# Patient Record
Sex: Female | Born: 1960 | Race: Black or African American | Hispanic: No | Marital: Single | Smoking: Current every day smoker
Health system: Southern US, Community
[De-identification: ages and names within clinical notes are randomized; demographics above are authoritative.]

## PROBLEM LIST (undated history)

## (undated) DIAGNOSIS — E079 Disorder of thyroid, unspecified: Secondary | ICD-10-CM

## (undated) DIAGNOSIS — E119 Type 2 diabetes mellitus without complications: Secondary | ICD-10-CM

## (undated) DIAGNOSIS — J45909 Unspecified asthma, uncomplicated: Secondary | ICD-10-CM

## (undated) DIAGNOSIS — G510 Bell's palsy: Secondary | ICD-10-CM

## (undated) DIAGNOSIS — I10 Essential (primary) hypertension: Secondary | ICD-10-CM

## (undated) DIAGNOSIS — J449 Chronic obstructive pulmonary disease, unspecified: Secondary | ICD-10-CM

## (undated) DIAGNOSIS — I517 Cardiomegaly: Secondary | ICD-10-CM

## (undated) DIAGNOSIS — I509 Heart failure, unspecified: Secondary | ICD-10-CM

## (undated) HISTORY — DX: Unspecified asthma, uncomplicated: J45.909

## (undated) HISTORY — PX: CHOLECYSTECTOMY: SHX55

## (undated) HISTORY — PX: ABDOMINAL HYSTERECTOMY: SHX81

## (undated) HISTORY — PX: LAPAROSCOPY: SHX197

## (undated) HISTORY — DX: Type 2 diabetes mellitus without complications: E11.9

## (undated) HISTORY — PX: IMPLANTABLE CARDIOVERTER DEFIBRILLATOR IMPLANT: SHX5860

---

## 2006-03-14 ENCOUNTER — Ambulatory Visit: Payer: Self-pay

## 2006-04-21 ENCOUNTER — Emergency Department: Payer: Self-pay | Admitting: Unknown Physician Specialty

## 2006-04-21 ENCOUNTER — Other Ambulatory Visit: Payer: Self-pay

## 2006-04-26 ENCOUNTER — Other Ambulatory Visit: Payer: Self-pay

## 2006-04-26 ENCOUNTER — Emergency Department: Payer: Self-pay | Admitting: Emergency Medicine

## 2006-04-26 ENCOUNTER — Ambulatory Visit: Payer: Self-pay | Admitting: Emergency Medicine

## 2007-03-19 ENCOUNTER — Ambulatory Visit: Payer: Self-pay

## 2008-03-20 ENCOUNTER — Ambulatory Visit: Payer: Self-pay

## 2009-01-14 ENCOUNTER — Inpatient Hospital Stay: Payer: Self-pay | Admitting: Internal Medicine

## 2011-05-18 ENCOUNTER — Inpatient Hospital Stay: Payer: Self-pay | Admitting: Specialist

## 2012-01-15 ENCOUNTER — Emergency Department: Payer: Self-pay | Admitting: Emergency Medicine

## 2012-01-15 LAB — COMPREHENSIVE METABOLIC PANEL
Albumin: 3.3 g/dL — ABNORMAL LOW (ref 3.4–5.0)
Calcium, Total: 8.2 mg/dL — ABNORMAL LOW (ref 8.5–10.1)
Chloride: 108 mmol/L — ABNORMAL HIGH (ref 98–107)
Co2: 27 mmol/L (ref 21–32)
EGFR (African American): >60
EGFR (Non-African Amer.): 58 — ABNORMAL LOW
Glucose: 106 mg/dL — ABNORMAL HIGH (ref 65–99)
SGOT(AST): 30 U/L (ref 15–37)
SGPT (ALT): 45 U/L

## 2012-01-15 LAB — CBC
HCT: 40.2 % (ref 35.0–47.0)
MCV: 89 fL (ref 80–100)
Platelet: 118 10*3/uL — ABNORMAL LOW (ref 150–440)
RBC: 4.52 10*6/uL (ref 3.80–5.20)
RDW: 16.8 % — ABNORMAL HIGH (ref 11.5–14.5)

## 2012-01-15 LAB — PRO B NATRIURETIC PEPTIDE: B-Type Natriuretic Peptide: 3941 pg/mL — ABNORMAL HIGH (ref 0–125)

## 2012-01-15 LAB — TROPONIN I: Troponin-I: 0.08 ng/mL — ABNORMAL HIGH

## 2013-10-05 ENCOUNTER — Emergency Department: Payer: Self-pay | Admitting: Emergency Medicine

## 2013-10-05 LAB — COMPREHENSIVE METABOLIC PANEL
ALBUMIN: 3.5 g/dL (ref 3.4–5.0)
Alkaline Phosphatase: 54 U/L
Anion Gap: 5 — ABNORMAL LOW (ref 7–16)
BUN: 5 mg/dL — ABNORMAL LOW (ref 7–18)
Bilirubin,Total: 0.3 mg/dL (ref 0.2–1.0)
CALCIUM: 8.6 mg/dL (ref 8.5–10.1)
CHLORIDE: 109 mmol/L — AB (ref 98–107)
Co2: 21 mmol/L (ref 21–32)
Creatinine: 0.65 mg/dL (ref 0.60–1.30)
EGFR (African American): >60
EGFR (Non-African Amer.): >60
GLUCOSE: 96 mg/dL (ref 65–99)
Osmolality: 267 (ref 275–301)
Potassium: 5 mmol/L (ref 3.5–5.1)
SGOT(AST): 30 U/L (ref 15–37)
SGPT (ALT): 14 U/L (ref 12–78)
SODIUM: 135 mmol/L — AB (ref 136–145)
Total Protein: 7.5 g/dL (ref 6.4–8.2)

## 2013-10-05 LAB — CK: CK, Total: 107 U/L (ref 21–215)

## 2013-10-05 LAB — CBC
HCT: 41.3 % (ref 35.0–47.0)
HGB: 13.5 g/dL (ref 12.0–16.0)
MCH: 28.1 pg (ref 26.0–34.0)
MCHC: 32.8 g/dL (ref 32.0–36.0)
MCV: 86 fL (ref 80–100)
PLATELETS: 123 10*3/uL — AB (ref 150–440)
RBC: 4.82 10*6/uL (ref 3.80–5.20)
RDW: 15.7 % — ABNORMAL HIGH (ref 11.5–14.5)
WBC: 5.7 10*3/uL (ref 3.6–11.0)

## 2014-02-18 ENCOUNTER — Emergency Department: Payer: Self-pay | Admitting: Emergency Medicine

## 2014-03-05 DIAGNOSIS — I10 Essential (primary) hypertension: Secondary | ICD-10-CM | POA: Insufficient documentation

## 2014-03-05 DIAGNOSIS — R519 Headache, unspecified: Secondary | ICD-10-CM | POA: Insufficient documentation

## 2014-03-05 DIAGNOSIS — D649 Anemia, unspecified: Secondary | ICD-10-CM | POA: Insufficient documentation

## 2014-03-05 DIAGNOSIS — R51 Headache: Secondary | ICD-10-CM | POA: Insufficient documentation

## 2014-05-06 DIAGNOSIS — E0500 Thyrotoxicosis with diffuse goiter without thyrotoxic crisis or storm: Secondary | ICD-10-CM | POA: Insufficient documentation

## 2014-05-06 DIAGNOSIS — O903 Peripartum cardiomyopathy: Secondary | ICD-10-CM | POA: Insufficient documentation

## 2014-05-06 DIAGNOSIS — I471 Supraventricular tachycardia: Secondary | ICD-10-CM | POA: Insufficient documentation

## 2014-05-06 DIAGNOSIS — D509 Iron deficiency anemia, unspecified: Secondary | ICD-10-CM | POA: Insufficient documentation

## 2014-05-08 DIAGNOSIS — R600 Localized edema: Secondary | ICD-10-CM | POA: Insufficient documentation

## 2014-05-08 DIAGNOSIS — R0602 Shortness of breath: Secondary | ICD-10-CM | POA: Insufficient documentation

## 2014-06-03 ENCOUNTER — Ambulatory Visit: Payer: Self-pay | Admitting: Cardiology

## 2014-06-11 DIAGNOSIS — Z9889 Other specified postprocedural states: Secondary | ICD-10-CM | POA: Insufficient documentation

## 2014-09-01 ENCOUNTER — Ambulatory Visit: Payer: Self-pay | Admitting: Cardiology

## 2014-09-01 LAB — URINALYSIS, COMPLETE
Bacteria: NONE SEEN
Bilirubin,UR: NEGATIVE
Blood: NEGATIVE
Glucose,UR: NEGATIVE mg/dL (ref 0–75)
Ketone: NEGATIVE
Leukocyte Esterase: NEGATIVE
Nitrite: NEGATIVE
Ph: 5 (ref 4.5–8.0)
Protein: NEGATIVE
SPECIFIC GRAVITY: 1.027 (ref 1.003–1.030)

## 2014-09-01 LAB — CBC WITH DIFFERENTIAL/PLATELET
Basophil #: 0 10*3/uL (ref 0.0–0.1)
Basophil %: 0.9 %
EOS PCT: 3.8 %
Eosinophil #: 0.2 10*3/uL (ref 0.0–0.7)
HCT: 39.7 % (ref 35.0–47.0)
HGB: 12.7 g/dL (ref 12.0–16.0)
Lymphocyte #: 1 10*3/uL (ref 1.0–3.6)
Lymphocyte %: 19.6 %
MCH: 28.2 pg (ref 26.0–34.0)
MCHC: 32 g/dL (ref 32.0–36.0)
MCV: 88 fL (ref 80–100)
Monocyte #: 0.3 x10 3/mm (ref 0.2–0.9)
Monocyte %: 6.7 %
Neutrophil #: 3.4 10*3/uL (ref 1.4–6.5)
Neutrophil %: 69 %
PLATELETS: 127 10*3/uL — AB (ref 150–440)
RBC: 4.52 10*6/uL (ref 3.80–5.20)
RDW: 15.9 % — ABNORMAL HIGH (ref 11.5–14.5)
WBC: 5 10*3/uL (ref 3.6–11.0)

## 2014-09-01 LAB — PROTIME-INR
INR: 1
Prothrombin Time: 13.2 secs (ref 11.5–14.7)

## 2014-09-01 LAB — BASIC METABOLIC PANEL
Anion Gap: 5 — ABNORMAL LOW (ref 7–16)
BUN: 14 mg/dL (ref 7–18)
CALCIUM: 8.2 mg/dL — AB (ref 8.5–10.1)
CREATININE: 0.93 mg/dL (ref 0.60–1.30)
Chloride: 110 mmol/L — ABNORMAL HIGH (ref 98–107)
Co2: 28 mmol/L (ref 21–32)
EGFR (African American): >60
EGFR (Non-African Amer.): >60
Glucose: 95 mg/dL (ref 65–99)
Osmolality: 285 (ref 275–301)
POTASSIUM: 4 mmol/L (ref 3.5–5.1)
Sodium: 143 mmol/L (ref 136–145)

## 2014-09-01 LAB — APTT: ACTIVATED PTT: 26.6 s (ref 23.6–35.9)

## 2014-09-04 ENCOUNTER — Ambulatory Visit: Payer: Self-pay | Admitting: Cardiology

## 2015-01-23 NOTE — Op Note (Signed)
PATIENT NAME:  Heather Choi, Heather Choi MR#:  [id number] DATE OF BIRTH:  Aug 10, 1961  DATE OF PROCEDURE:  09/04/2014   TITLE OF PROCEDURE NOTE: Implantation of a new dual-chamber implantable cardioverter defibrillator.   INDICATION: Chronic systolic heart failure, New York Heart Association class II symptoms, ejection fraction 25%.   DETAILS OF PROCEDURE: Informed consent was obtained and placed in the patient's permanent medical record. The risks and benefits of the procedure were explained to the patient, and she consented to proceed. The patient was brought to the Operating Room in a fasting, non-sedated state. The appropriate resuscitative equipment was attached to the patient. The patient was prepped and draped in the usual sterile manner. The area of the left infraclavicular fossa was accessed after local anesthetic was administered.  A 2 cm incision was made in the left infraclavicular fossa. Using a combination of electrocautery and blunt dissection, an ICD pocket was fashioned over the pectoralis muscle in the usual manner. Access to the central circulation was attained x 2 using fluoroscopic guidance and the modified Seldinger technique. Two guidewires were placed into the area of the inferior vena cava.  Over each guidewire. A sheath was advanced. Over the first 9 French sheath, the dilator and guidewire were subsequently removed and the right ventricular lead was advanced to the area of the right ventricular outflow tract.  The lead was then retracted and subsequently placed in the right ventricular apex.  After the appropriate testing was performed, sensed R waves were 19.6 mV with a slew rate of greater than 4 volts per sec, lead impedance of 671 ohms and a threshold of 0.6 volts at 0.5 ms.  The lead was secured to the prepectoralis fascia using an 0 silk suture and adequate redundancy was confirmed using fluoroscopic guidance.  Attention was then turned to the second guidewire over which a 7 French sheath  was advanced, the dilator and guidewire were subsequently removed. The right atrial lead was then advanced to the area of the right anterior lateral wall.  P waves there were 2.3 mV with a slew rate of 0.7 volts per second, impedance of 703 ohms, threshold 0.8 volts at 0.5 ms.  The lead was then secured to the prepectoralis fascia using an 0 silk suture and adequate redundancy was confirmed.   The pocket was irrigated with copious amounts of gentamicin solution.  Adequate hemostasis was obtained and the ICD generator was attached to the right atrial and right ventricular leads and then placed in the pocket.  The pocket was then closed with running layers of 2-0 Vicryl, 3-0 V- Loc, 4-0 V-Loc for the subcuticular layer.  Steri-Strips were applied over the incision site as well as sterile gauze and OpSite.  At the conclusion of the procedure, there were no complications.   ESTIMATED BLOOD LOSS: Minimal.   SUMMARY OF IMPLANTED HARDWARE:  The patient received a Medtronic dual chamber implantable cardiac defibrillator, Model Evera [id number], serial number [id number].  The right atrial lead was a Medtronic [id number], serial number [id number]. The right ventricular lead was a Medtronic Sprint [place], model [id number], single coil lead, 55 cm, serial number [id number].  The final program parameters: Device was program mode AAIDDD with a lower rate limit of 50, upper tracking rate 130, upper sensory rate 130.  The tachy therapies were as follows. There is a VF zone programmed on at a rate of 200 beats per minute with a detection of 30 out of 40 intervals with redetection at 12 out of 16 intervals.  There was a monitor zone programmed at 158 beats per minute.  This will likely be increased to prevent recording episodes of sinus tachycardia in a patient of her age.      ____________________________ Anna Genre. Maisie Fus, MD klt:DT D: 09/04/2014 10:10:28 ET T: 09/04/2014 10:44:45 ET JOB#: [id number]  cc: Caryn Bee L. Maisie Fus, MD,  <Dictator> Sharion Settler MD ELECTRONICALLY SIGNED 09/10/2014 13:03

## 2015-05-26 ENCOUNTER — Emergency Department
Admission: EM | Admit: 2015-05-26 | Discharge: 2015-05-26 | Disposition: A | Discharge status: Home / Self Care | Payer: Self-pay | Attending: Emergency Medicine | Admitting: Emergency Medicine

## 2015-05-26 ENCOUNTER — Other Ambulatory Visit: Payer: Self-pay

## 2015-05-26 DIAGNOSIS — Z880 Allergy status to penicillin: Secondary | ICD-10-CM | POA: Insufficient documentation

## 2015-05-26 DIAGNOSIS — I471 Supraventricular tachycardia: Secondary | ICD-10-CM | POA: Insufficient documentation

## 2015-05-26 DIAGNOSIS — E039 Hypothyroidism, unspecified: Secondary | ICD-10-CM | POA: Insufficient documentation

## 2015-05-26 DIAGNOSIS — Z79899 Other long term (current) drug therapy: Secondary | ICD-10-CM | POA: Insufficient documentation

## 2015-05-26 DIAGNOSIS — Z7982 Long term (current) use of aspirin: Secondary | ICD-10-CM | POA: Insufficient documentation

## 2015-05-26 DIAGNOSIS — Z720 Tobacco use: Secondary | ICD-10-CM | POA: Insufficient documentation

## 2015-05-26 DIAGNOSIS — I10 Essential (primary) hypertension: Secondary | ICD-10-CM | POA: Insufficient documentation

## 2015-05-26 DIAGNOSIS — Z792 Long term (current) use of antibiotics: Secondary | ICD-10-CM | POA: Insufficient documentation

## 2015-05-26 HISTORY — DX: Bell's palsy: G51.0

## 2015-05-26 HISTORY — DX: Disorder of thyroid, unspecified: E07.9

## 2015-05-26 HISTORY — DX: Cardiomegaly: I51.7

## 2015-05-26 HISTORY — DX: Essential (primary) hypertension: I10

## 2015-05-26 LAB — CBC
HCT: 42.2 % (ref 35.0–47.0)
Hemoglobin: 13.8 g/dL (ref 12.0–16.0)
MCH: 27.8 pg (ref 26.0–34.0)
MCHC: 32.6 g/dL (ref 32.0–36.0)
MCV: 85.3 fL (ref 80.0–100.0)
PLATELETS: 107 10*3/uL — AB (ref 150–440)
RBC: 4.95 MIL/uL (ref 3.80–5.20)
RDW: 15.8 % — AB (ref 11.5–14.5)
WBC: 10.2 10*3/uL (ref 3.6–11.0)

## 2015-05-26 LAB — BASIC METABOLIC PANEL
Anion gap: 6 (ref 5–15)
BUN: 12 mg/dL (ref 6–20)
CHLORIDE: 106 mmol/L (ref 101–111)
CO2: 26 mmol/L (ref 22–32)
CREATININE: 1.35 mg/dL — AB (ref 0.44–1.00)
Calcium: 9 mg/dL (ref 8.9–10.3)
GFR calc Af Amer: 51 mL/min — ABNORMAL LOW (ref >60)
GFR, EST NON AFRICAN AMERICAN: 44 mL/min — AB (ref >60)
Glucose, Bld: 247 mg/dL — ABNORMAL HIGH (ref 65–99)
Potassium: 3.7 mmol/L (ref 3.5–5.1)
SODIUM: 138 mmol/L (ref 135–145)

## 2015-05-26 LAB — TROPONIN I
Troponin I: 0.04 ng/mL — ABNORMAL HIGH (ref <0.031)
Troponin I: 0.05 ng/mL — ABNORMAL HIGH (ref <0.031)

## 2015-05-26 LAB — MAGNESIUM: MAGNESIUM: 1.7 mg/dL (ref 1.7–2.4)

## 2015-05-26 LAB — TSH: TSH: 4.545 u[IU]/mL — ABNORMAL HIGH (ref 0.350–4.500)

## 2015-05-26 MED ORDER — SODIUM CHLORIDE 0.9 % IV BOLUS (SEPSIS)
1000.0000 mL | Freq: Once | INTRAVENOUS | Status: AC
  Administered 2015-05-26: 1000 mL via INTRAVENOUS

## 2015-05-26 MED ORDER — CLINDAMYCIN HCL 300 MG PO CAPS: 300.0000 mg | ORAL_CAPSULE | Freq: Three times a day (TID) | ORAL | Status: DC

## 2015-05-26 MED ORDER — MAGNESIUM SULFATE 2 GM/50ML IV SOLN
2.0000 g | Freq: Once | INTRAVENOUS | Status: AC
  Administered 2015-05-26: 2 g via INTRAVENOUS
  Filled 2015-05-26: qty 50

## 2015-05-26 NOTE — ED Notes (Signed)
Patient woke up with fast heartrate

## 2015-05-26 NOTE — ED Notes (Signed)

## 2015-05-26 NOTE — ED Provider Notes (Signed)
[facility] Emergency Department Provider Note  ____________________________________________  Time seen: Approximately 0034 AM  I have reviewed the triage vital signs and the nursing notes.   HISTORY  Chief Complaint Irregular Heart Beat    HPI Heather Choi is a 54 y.o. female who comes in with her heart racing . The patient reports that the hear racing started around 2300. The patient reports that she was sleeping and she was awoken to speak with her sister on the phone. After getting off of the phone the patient was walking back to bed and felt her hear racing. The patient has felt some tightness in her arm and chest and some SOB which was concerning. This has happened multiple times in the past with the last time being 3 years ago. The patient has an enlarged heart and a defibrillator. The patient reports that her pain is a 7/10 The patient came in to be evaluated. In the past she was given medicine and sent home.   Past Medical History  Diagnosis Date  . Enlarged heart   . Thyroid disease   . Hypertension   . Bell's palsy   . Arthritis     There are no active problems to display for this patient.   Past Surgical History  Procedure Laterality Date  . Implantable cardioverter defibrillator implant    . Abdominal hysterectomy    . Cholecystectomy      Current Outpatient Rx  Name  Route  Sig  Dispense  Refill  . ALPRAZolam (XANAX) 0.5 MG tablet   Oral   Take 0.5 mg by mouth at bedtime as needed for anxiety (only takes as needed.).         Marland Kitchen aspirin EC 81 MG tablet   Oral   Take 1 tablet by mouth daily.         . carvedilol (COREG) 25 MG tablet   Oral   Take 1 tablet by mouth 2 (two) times daily.         . furosemide (LASIX) 40 MG tablet   Oral   Take 0.5 tablets by mouth daily.         . isosorbide mononitrate (IMDUR) 30 MG 24 hr tablet   Oral   Take 1 tablet by mouth daily.         Marland Kitchen levothyroxine (SYNTHROID, LEVOTHROID)  150 MCG tablet   Oral   Take 1 tablet by mouth daily. Take 1 tablet every morning with water on an empty stomach 30-60 minutes before breakfast.         . lisinopril (PRINIVIL,ZESTRIL) 40 MG tablet   Oral   Take 1 tablet by mouth daily.         . potassium chloride SA (K-DUR,KLOR-CON) 20 MEQ tablet   Oral   Take 1 tablet by mouth 2 (two) times daily.         Marland Kitchen spironolactone (ALDACTONE) 25 MG tablet   Oral   Take 1 tablet by mouth daily.         . clindamycin (CLEOCIN) 300 MG capsule   Oral   Take 1 capsule (300 mg total) by mouth 3 (three) times daily.   15 capsule   0     Allergies Amlodipine; Penicillins; and Hydrochlorothiazide  No family history on file.  Social History Social History  Substance Use Topics  . Smoking status: Current Every Day Smoker  . Smokeless tobacco: Never Used  . Alcohol Use: None  Comment: rarely    Review of Systems Constitutional: No fever/chills Eyes: No visual changes. ENT: No sore throat. Cardiovascular:  chest pain, palpitations Respiratory:  shortness of breath. Gastrointestinal: No abdominal pain.  No nausea, no vomiting.  No diarrhea.  No constipation. Genitourinary: Negative for dysuria. Musculoskeletal: Negative for back pain. Skin: Negative for rash. Neurological: dizziness, Negative for headaches, focal weakness or numbness.  10-point ROS otherwise negative.  ____________________________________________   PHYSICAL EXAM:  VITAL SIGNS: ED Triage Vitals  Enc Vitals Group     BP 05/26/15 0028 69/29 mmHg     Pulse Rate 05/26/15 0028 167     Resp 05/26/15 0028 17     Temp 05/26/15 0028 97.8 F (36.6 C)     Temp Source 05/26/15 0028 Oral     SpO2 05/26/15 0028 98 %     Weight 05/26/15 0040 150 lb (68.04 kg)     Height 05/26/15 0040 5\' 2"  (1.575 m)     Head Cir --      Peak Flow --      Pain Score 05/26/15 0038 7     Pain Loc --      Pain Edu? --      Excl. in GC? --     Constitutional: Alert and  oriented. Well appearing and in severe distress. Eyes: Conjunctivae are normal. PERRL. EOMI. Head: Atraumatic. Nose: No congestion/rhinnorhea. Cardiovascular: Tachycardia regular rhythm. Grossly normal heart sounds.  Good peripheral circulation. Respiratory: Normal respiratory effort.  No retractions. Lungs CTAB. Gastrointestinal: Soft and nontender. No distention. No abdominal bruits. No CVA tenderness. Musculoskeletal: No lower extremity tenderness nor edema. . Neurologic:  Normal speech and language.  Skin:  Skin is warm, dry and intact.  Psychiatric: Mood and affect are normal.   ____________________________________________   LABS (all labs ordered are listed, but only abnormal results are displayed)  Labs Reviewed  BASIC METABOLIC PANEL - Abnormal; Notable for the following:    Glucose, Bld 247 (*)    Creatinine, Ser 1.35 (*)    GFR calc non Af Amer 44 (*)    GFR calc Af Amer 51 (*)    All other components within normal limits  CBC - Abnormal; Notable for the following:    RDW 15.8 (*)    Platelets 107 (*)    All other components within normal limits  TROPONIN I - Abnormal; Notable for the following:    Troponin I 0.04 (*)    All other components within normal limits  TSH - Abnormal; Notable for the following:    TSH 4.545 (*)    All other components within normal limits  TROPONIN I - Abnormal; Notable for the following:    Troponin I 0.05 (*)    All other components within normal limits  MAGNESIUM   ____________________________________________  EKG  ED ECG REPORT I, Rebecka Apley, the attending physician, personally viewed and interpreted this ECG.   Date: 05/26/2015  EKG Time: 0032  Rate: 168  Rhythm: Suprerventricular tachycardia  Axis: normal  Intervals:none  ST&T Change: flipped t waves lead I , avl  ED ECG REPORT #2 I, Rebecka Apley, the attending physician, personally viewed and interpreted this ECG.   Date: 05/26/2015  EKG Time: 521   Rate: 74  Rhythm: normal sinus rhythm  Axis: normal  Intervals:none  ST&T Change: flipped t waves I, avl, V4, V5, V6 similar to 10/2013    ____________________________________________  RADIOLOGY  none ____________________________________________   PROCEDURES  Procedure(s)  performed: None  Critical Care performed: Yes, see critical care note(s) CRITICAL CARE Performed by: Lucrezia Europe P   Total critical care time: 30 min  Critical care time was exclusive of separately billable procedures and treating other patients.  Critical care was necessary to treat or prevent imminent or life-threatening deterioration.  Critical care was time spent personally by me on the following activities: development of treatment plan with patient and/or surrogate as well as nursing, discussions with consultants, evaluation of patient's response to treatment, examination of patient, obtaining history from patient or surrogate, ordering and performing treatments and interventions, ordering and review of laboratory studies, ordering and review of radiographic studies, pulse oximetry and re-evaluation of patient's condition.  ____________________________________________   INITIAL IMPRESSION / ASSESSMENT AND PLAN / ED COURSE  Pertinent labs & imaging results that were available during my care of the patient were reviewed by me and considered in my medical decision making (see chart for details).  The patient was brought back immediately from triage. On monitoring it was found that the patient did have SVT. As she was getting her IV started the plan is to give the patient adenosine but her supraventricular tachycardia broke prior to receiving medications. The patient reports that her chest pain did improve after the improvement of the patient's heart rate.She was found to have low magnesium and a high TSH. The patient was given some magnesium. The patient also received some IV fluids and monitoring for  some hours. Although the patient has a mildly elevated troponin there was no rise on repeat which allows me to think that it was due to the demand of her SVT. The patient feels well and will be discharged to home. The patient wanted her spider bites evaluated and felt they were infected. The patient will receive some clindamycin for her bug bites ____________________________________________   FINAL CLINICAL IMPRESSION(S) / ED DIAGNOSES  Final diagnoses:  SVT (supraventricular tachycardia)  Hypomagnesemia  Hypothyroidism, unspecified hypothyroidism type      Rebecka Apley, MD 05/26/15 [phone]

## 2015-05-26 NOTE — Discharge Instructions (Signed)
Supraventricular Tachycardia Supraventricular tachycardia (SVT) is an abnormal heart rhythm (arrhythmia) that causes the heart to beat very fast (tachycardia). This kind of fast heartbeat originates in the upper chambers of the heart (atria). SVT can cause the heart to beat greater than 100 beats per minute. SVT can have a rapid burst of heartbeats. This can start and stop suddenly without warning and is called nonsustained. SVT can also be sustained, in which the heart beats at a continuous fast rate.  CAUSES  There can be different causes of SVT. Some of these include:  Heart valve problems such as mitral valve prolapse.  An enlarged heart (hypertrophic cardiomyopathy).  Congenital heart problems.  Heart inflammation (pericarditis).  Hyperthyroidism.  Low potassium or magnesium levels.  Caffeine.  Drug use such as cocaine, methamphetamines, or stimulants.  Some over-the-counter medicines such as:  Decongestants.  Diet medicines.  Herbal medicines. SYMPTOMS  Symptoms of SVT can vary. Symptoms depend on whether the SVT is sustained or nonsustained. You may experience:  No symptoms (asymptomatic).  An awareness of your heart beating rapidly (palpitations).  Shortness of breath.  Chest pain or pressure. If your blood pressure drops because of the SVT, you may experience:  Fainting or near fainting.  Weakness.  Dizziness. DIAGNOSIS  Different tests can be performed to diagnose SVT, such as:  An electrocardiogram (EKG). This is a painless test that records the electrical activity of your heart.  Holter monitor. This is a 24 hour recording of your heart rhythm. You will be given a diary. Write down all symptoms that you have and what you were doing at the time you experienced symptoms.  Arrhythmia monitor. This is a small device that your wear for several weeks. It records the heart rhythm when you have symptoms.  Echocardiogram. This is an imaging test to help detect  abnormal heart structure such as congenital abnormalities, heart valve problems, or heart enlargement.  Stress test. This test can help determine if the SVT is related to exercise.  Electrophysiology study (EPS). This is a procedure that evaluates your heart's electrical system and can help your caregiver find the cause of your SVT. TREATMENT  Treatment of SVT depends on the symptoms, how often it recurs, and whether there are any underlying heart problems.   If symptoms are rare and no other cardiac disease is present, no treatment may be needed.  Blood work may be done to check potassium, magnesium, and thyroid hormone levels to see if they are abnormal. If these levels are abnormal, treatment to correct the problems will occur. Medicines Your caregiver may use oral medicines to treat SVT. These medicines are given for long-term control of SVT. Medicines may be used alone or in combination with other treatments. These medicines work to slow nerve impulses in the heart muscle. These medicines can also be used to treat high blood pressure. Some of these medicines may include:  Calcium channel blockers.  Beta blockers.  Digoxin. Nonsurgical procedures Nonsurgical techniques may be used if oral medicines do not work. Some examples include:  Cardioversion. This technique uses either drugs or an electrical shock to restore a normal heart rhythm.  Cardioversion drugs may be given through an intravenous (IV) line to help "reset" the heart rhythm.  In electrical cardioversion, the caregiver shocks your heart to stop its beat for a split second. This helps to reset the heart to a normal rhythm.  Ablation. This procedure is done under mild sedation. High frequency radio wave energy is used to  destroy the area of heart tissue responsible for the SVT. HOME CARE INSTRUCTIONS   Do not smoke.  Only take medicines prescribed by your caregiver. Check with your caregiver before using over-the-counter  medicines.  Check with your caregiver about how much alcohol and caffeine (coffee, tea, colas, or chocolate) you may have.  It is very important to keep all follow-up referrals and appointments in order to properly manage this problem. SEEK IMMEDIATE MEDICAL CARE IF:  You have dizziness.  You faint or nearly faint.  You have shortness of breath.  You have chest pain or pressure.  You have sudden nausea or vomiting.  You have profuse sweating.  You are concerned about how long your symptoms last.  You are concerned about the frequency of your SVT episodes. If you have the above symptoms, call your local emergency services (911 in [place] immediately. Do not drive yourself to the hospital. MAKE SURE YOU:   Understand these instructions.  Will watch your condition.  Will get help right away if you are not doing well or get worse. Document Released: 09/18/2005 Document Revised: 12/11/2011 Document Reviewed: 12/31/2008 [facility] Patient Information 2015 [place], [place]. This information is not intended to replace advice given to you by your health care provider. Make sure you discuss any questions you have with your health care provider.  Hypomagnesemia Magnesium is a common ion (mineral) in the body which is needed for metabolism. It is about how the body handles food and other chemical reactions necessary for life. Only about 2% of the magnesium in our body is found in the blood. When this is low, it is called hypomagnesemia. The blood will measure only a tiny amount of the magnesium in our body. When it is low in our blood, it does not mean that the whole body supply is low. The normal serum concentration ranges from 1.8-2.5 mEq/L. When the level gets to be less than 1.0 mEq/L, a number of problems begin to happen.  CAUSES   Receiving intravenous fluids without magnesium replacement.  Loss of magnesium from the bowel by nasogastric suction.  Loss of magnesium from nausea and  vomiting or severe diarrhea. Any of the inflammatory bowel conditions can cause this.  Abuse of alcohol often leads to low serum magnesium.  An inherited form of magnesium loss happens when the kidneys lose magnesium. This is called familial or primary hypomagnesemia.  Some medications such as diuretics also cause the loss of magnesium. SYMPTOMS  These following problems are worse if the changes in magnesium levels come on suddenly.  Tremor.  Confusion.  Muscle weakness.  Oversensitive to sights and sounds.  Sensitive reflexes.  Depression.  Muscular fibrillations.  Overreactivity of the nerves.  Irritability.  Psychosis.  Spasms of the hand muscles.  Tetany (where the muscles go into uncontrollable spasms). DIAGNOSIS  This condition can be diagnosed by blood tests. TREATMENT   In an emergency, magnesium can be given intravenously (by vein).  If the condition is less worrisome, it can be corrected by diet. High levels of magnesium are found in green leafy vegetables, peas, beans, and nuts among other things. It can also be given through medications by mouth.  If it is being caused by medications, changes can be made.  If alcohol is a problem, help is available if there are difficulties giving it up. Document Released: 06/14/2005 Document Revised: 02/02/2014 Document Reviewed: 05/08/2008 [facility] Patient Information 2015 [place], [place]. This information is not intended to replace advice given to you by your  health care provider. Make sure you discuss any questions you have with your health care provider.  Hypothyroidism The thyroid is a large gland located in the lower front of your neck. The thyroid gland helps control metabolism. Metabolism is how your body handles food. It controls metabolism with the hormone thyroxine. When this gland is underactive (hypothyroid), it produces too little hormone.  CAUSES These include:   Absence or destruction of thyroid  tissue.  Goiter due to iodine deficiency.  Goiter due to medications.  Congenital defects (since birth).  Problems with the pituitary. This causes a lack of TSH (thyroid stimulating hormone). This hormone tells the thyroid to turn out more hormone. SYMPTOMS  Lethargy (feeling as though you have no energy)  Cold intolerance  Weight gain (in spite of normal food intake)  Dry skin  Coarse hair  Menstrual irregularity (if severe, may lead to infertility)  Slowing of thought processes Cardiac problems are also caused by insufficient amounts of thyroid hormone. Hypothyroidism in the newborn is cretinism, and is an extreme form. It is important that this form be treated adequately and immediately or it will lead rapidly to retarded physical and mental development. DIAGNOSIS  To prove hypothyroidism, your caregiver may do blood tests and ultrasound tests. Sometimes the signs are hidden. It may be necessary for your caregiver to watch this illness with blood tests either before or after diagnosis and treatment. TREATMENT  Low levels of thyroid hormone are increased by using synthetic thyroid hormone. This is a safe, effective treatment. It usually takes about four weeks to gain the full effects of the medication. After you have the full effect of the medication, it will generally take another four weeks for problems to leave. Your caregiver may start you on low doses. If you have had heart problems the dose may be gradually increased. It is generally not an emergency to get rapidly to normal. HOME CARE INSTRUCTIONS   Take your medications as your caregiver suggests. Let your caregiver know of any medications you are taking or start taking. Your caregiver will help you with dosage schedules.  As your condition improves, your dosage needs may increase. It will be necessary to have continuing blood tests as suggested by your caregiver.  Report all suspected medication side effects to your  caregiver. SEEK MEDICAL CARE IF: Seek medical care if you develop:  Sweating.  Tremulousness (tremors).  Anxiety.  Rapid weight loss.  Heat intolerance.  Emotional swings.  Diarrhea.  Weakness. SEEK IMMEDIATE MEDICAL CARE IF:  You develop chest pain, an irregular heart beat (palpitations), or a rapid heart beat. MAKE SURE YOU:   Understand these instructions.  Will watch your condition.  Will get help right away if you are not doing well or get worse. Document Released: 09/18/2005 Document Revised: 12/11/2011 Document Reviewed: 05/08/2008 [facility] Patient Information 2015 [place], [place]. This information is not intended to replace advice given to you by your health care provider. Make sure you discuss any questions you have with your health care provider.

## 2015-12-30 ENCOUNTER — Emergency Department
Admission: EM | Admit: 2015-12-30 | Discharge: 2015-12-30 | Disposition: A | Discharge status: Home / Self Care | Payer: Self-pay | Attending: Emergency Medicine | Admitting: Emergency Medicine

## 2015-12-30 DIAGNOSIS — I517 Cardiomegaly: Secondary | ICD-10-CM | POA: Insufficient documentation

## 2015-12-30 DIAGNOSIS — G510 Bell's palsy: Secondary | ICD-10-CM | POA: Insufficient documentation

## 2015-12-30 DIAGNOSIS — E079 Disorder of thyroid, unspecified: Secondary | ICD-10-CM | POA: Insufficient documentation

## 2015-12-30 DIAGNOSIS — Z955 Presence of coronary angioplasty implant and graft: Secondary | ICD-10-CM | POA: Insufficient documentation

## 2015-12-30 DIAGNOSIS — H109 Unspecified conjunctivitis: Secondary | ICD-10-CM | POA: Insufficient documentation

## 2015-12-30 DIAGNOSIS — I10 Essential (primary) hypertension: Secondary | ICD-10-CM | POA: Insufficient documentation

## 2015-12-30 DIAGNOSIS — Z79899 Other long term (current) drug therapy: Secondary | ICD-10-CM | POA: Insufficient documentation

## 2015-12-30 DIAGNOSIS — Z792 Long term (current) use of antibiotics: Secondary | ICD-10-CM | POA: Insufficient documentation

## 2015-12-30 DIAGNOSIS — F17210 Nicotine dependence, cigarettes, uncomplicated: Secondary | ICD-10-CM | POA: Insufficient documentation

## 2015-12-30 DIAGNOSIS — M1990 Unspecified osteoarthritis, unspecified site: Secondary | ICD-10-CM | POA: Insufficient documentation

## 2015-12-30 DIAGNOSIS — Z7982 Long term (current) use of aspirin: Secondary | ICD-10-CM | POA: Insufficient documentation

## 2015-12-30 DIAGNOSIS — H16002 Unspecified corneal ulcer, left eye: Secondary | ICD-10-CM | POA: Insufficient documentation

## 2015-12-30 MED ORDER — ERYTHROMYCIN 5 MG/GM OP OINT
1.0000 "application " | TOPICAL_OINTMENT | Freq: Four times a day (QID) | OPHTHALMIC | Status: DC
  Administered 2015-12-30: 1 via OPHTHALMIC
  Filled 2015-12-30 (×2): qty 1

## 2015-12-30 MED ORDER — FLUORESCEIN SODIUM 1 MG OP STRP
ORAL_STRIP | OPHTHALMIC | Status: AC
  Filled 2015-12-30: qty 1

## 2015-12-30 MED ORDER — TETRACAINE HCL 0.5 % OP SOLN
OPHTHALMIC | Status: AC
  Filled 2015-12-30: qty 2

## 2015-12-30 MED ORDER — KETOROLAC TROMETHAMINE 0.5 % OP SOLN: 1.0000 [drp] | Freq: Four times a day (QID) | OPHTHALMIC | Status: DC

## 2015-12-30 MED ORDER — GENTAMICIN SULFATE 0.3 % OP SOLN: 2.0000 [drp] | OPHTHALMIC | Status: DC

## 2015-12-30 NOTE — Discharge Instructions (Signed)
Corneal Ulcer °A corneal ulcer is an open sore on the cornea. The cornea is the clear covering at the front and center of the eye.  °CAUSES  °Most corneal ulcers are caused by infection, but there are other causes as well. °· Bacterial infection. A bacterial infection can occur and cause a corneal ulcer if: °¨ Contact lenses are worn too long (especially overnight) or are not properly cared for. °¨ An eye injury occurs, allowing bacteria to infect the area of injury. °· Viral infection. A viral infection can occur and cause a corneal ulcer if: °¨ The eye becomes infected with a virus, such as the herpes simplex (cold sore) virus, chickenpox virus, or shingles virus. °· Fungal infection. A fungal infection can occur and cause a corneal ulcer if: °¨ An eye injury resulted from contact with a plant or plant material. °¨ An anti-inflammatory eye drop is overused. °¨ You have a weakened immune system. °¨ Contact lenses are improperly cared for or become infected. °· Foreign bodies in the eye, such as sand, glass, or small pieces of glass or metal. °· Dry eyes. °· Certain disorders that prevent eyelids from closing completely, such as Bell's palsy. °· Contact lenses, especially extended-wear soft contact lenses. Contact lenses can: °¨ Scratch the cornea's surface, allowing bacteria to enter the scratch. °¨ Trap dirt underneath the contact lens, which can scratch the cornea. °¨ Harbor bacteria and fungi, making it more likely for bacterial infections to occur. °¨ Block oxygen from the cornea, making it more likely for infections to occur. °SYMPTOMS  °· Eye pain that is often severe. °· Blurry vision. °· Light sensitivity. °· Pus or thick discharge coming from your eye. °· Eye redness. °· Feeling like something is in your eye. °· Watery or itchy eye. °· Burning or stinging feeling. °Some ulcers that are very big may be seen as a white spot on the cornea. °DIAGNOSIS  °An eye exam will be performed. Your health care provider  may use a special kind of microscope (slit lamp) to look at the cornea. Eye drops may be put into the eye to make the ulcer easier to see. If it is suspected that an infection caused the corneal ulcer, tissue samples or cultures from the eye may be taken. Numbing eye drops will be given before any samples or cultures are taken. The samples or cultures will be examined in the lab to check for bacteria, viruses, or fungi. °TREATMENT  °Treatment of the corneal ulcer depends on the cause. If your ulcer is severe, you may be given antibiotic eye drops up until your health care provider knows the test results. Other treatments can include: °· Antibacterial, antiviral, or antifungal eye drops or ointment. °· Removing the foreign body that caused the eye injury. °· Artificial tears or a bandage contact lens if severe dry eyes caused the corneal ulcer. °· Over-the-counter or prescription pain medicine. °· Steroidal eye drops if the eye is inflamed and swollen. °· Antibiotic medicines by mouth. °· An injection of medicine under the thin membrane covering the eyeball (conjunctiva). This allows medicine to reach the ulcer in high doses. °· Eye patching to reduce irritation from blinking and bright light. An eye patch may not be given if the ulcer was caused by a bacterial infection. °If the corneal ulcer causes a scar on the cornea that interferes with vision, hospitalization and surgery may be needed to replace the cornea (corneal transplant). °HOME CARE INSTRUCTIONS  °· If prescribed, use your antibiotic   pills, eye drops, or ointment as directed. Continue using them even if you start to feel better. You may have to apply eye drops as often as every few minutes to every hour, for days. It may be necessary to set your alarm clock every few minutes to every hour during the night. This is absolutely necessary.  Only take over-the-counter or prescription medicines as directed by your health care provider.  Apply artificial  tears as needed if you have dry eyes.  Do not touch or rub your eye, because this may increase the irritation and spread the infection.  Avoid wearing eye makeup.  Stay in a dark room and use sunglasses if you have light sensitivity.  Apply cool packs to your eye to relieve discomfort and swelling.  If your eye is patched, you should not drive or use machinery. You will have reduced side vision and ability to judge distance.  Do not drive or operate machinery until approved by your health care provider. Your ability to judge distances is impaired.  Follow up with your health care provider as directed.  Do not wear contact lenses until your health care provider approves. If you normally wear contact lenses, follow these general rules to avoid the risk of a corneal ulcer:  Do not wear contact lenses while you sleep.  Wash your hands before removing contact lenses.  Properly sterilize and store your contact lenses.  Regularly clean your contact lens case.  Do not use your saliva or tap water to clean or wet your contact lenses.  Remove your contact lenses if your eye becomes irritated. You may put them back in once your eyes feel better. SEEK IMMEDIATE MEDICAL CARE IF:   You notice a change in your vision.  Your pain is getting worse, not better.  You have increasing discharge from the eye. MAKE SURE YOU:   Understand these instructions.  Will watch your condition.  Will get help right away if you are not doing well or get worse.   This information is not intended to replace advice given to you by your health care provider. Make sure you discuss any questions you have with your health care provider.   Document Released: 10/26/2004 Document Revised: 10/09/2014 Document Reviewed: 02/18/2013 Elsevier Interactive Patient Education 2016 [employer]  Use the eye drops as directed. Follow-up with your eye care provider as needed. Avoid extended use of contact lens until  treatment is completed.

## 2015-12-30 NOTE — ED Provider Notes (Signed)
[facility] Emergency Department Provider Note ____________________________________________  Time seen: 1025  I have reviewed the triage vital signs and the nursing notes.  HISTORY  Chief Complaint  Eye Problem   HPI Heather Choi is a 55 y.o. female presents to the ED for evaluation of left eye pain and drainage with onset yesterday. She notes that the eye is irritated and the lid is somewhat swollen.She denies any fevers, chills, sweats; and also denies any nausea, vomiting, or dizziness. She describes pain in the left eye at 10/10 in triage. She reports she has her extended wear contacts on because she doesn't own (spare) eyeglasses.   Past Medical History  Diagnosis Date  . Enlarged heart   . Thyroid disease   . Hypertension   . Bell's palsy   . Arthritis     There are no active problems to display for this patient.  Past Surgical History  Procedure Laterality Date  . Implantable cardioverter defibrillator implant    . Abdominal hysterectomy    . Cholecystectomy      Current Outpatient Rx  Name  Route  Sig  Dispense  Refill  . ALPRAZolam (XANAX) 0.5 MG tablet   Oral   Take 0.5 mg by mouth at bedtime as needed for anxiety (only takes as needed.).         Marland Kitchen aspirin EC 81 MG tablet   Oral   Take 1 tablet by mouth daily.         . carvedilol (COREG) 25 MG tablet   Oral   Take 1 tablet by mouth 2 (two) times daily.         . clindamycin (CLEOCIN) 300 MG capsule   Oral   Take 1 capsule (300 mg total) by mouth 3 (three) times daily.   15 capsule   0   . furosemide (LASIX) 40 MG tablet   Oral   Take 0.5 tablets by mouth daily.         Marland Kitchen gentamicin (GARAMYCIN) 0.3 % ophthalmic solution   Left Eye   Place 2 drops into the left eye every 4 (four) hours.   5 mL   0   . isosorbide mononitrate (IMDUR) 30 MG 24 hr tablet   Oral   Take 1 tablet by mouth daily.         Marland Kitchen ketorolac (ACULAR) 0.5 % ophthalmic solution   Left Eye   Place 1 drop into the left eye 4 (four) times daily.   5 mL   0   . levothyroxine (SYNTHROID, LEVOTHROID) 150 MCG tablet   Oral   Take 1 tablet by mouth daily. Take 1 tablet every morning with water on an empty stomach 30-60 minutes before breakfast.         . lisinopril (PRINIVIL,ZESTRIL) 40 MG tablet   Oral   Take 1 tablet by mouth daily.         . potassium chloride SA (K-DUR,KLOR-CON) 20 MEQ tablet   Oral   Take 1 tablet by mouth 2 (two) times daily.         Marland Kitchen spironolactone (ALDACTONE) 25 MG tablet   Oral   Take 1 tablet by mouth daily.          Allergies Amlodipine; Penicillins; and Hydrochlorothiazide  No family history on file.  Social History Social History  Substance Use Topics  . Smoking status: Current Every Day Smoker    Types: Cigarettes  . Smokeless tobacco: Never Used  .  Alcohol Use: No     Comment: rarely   Review of Systems  Constitutional: Negative for fever. Eyes: Negative for visual changes. Reports eye irritation and redness as above. ENT: Negative for sore throat. Musculoskeletal: Negative for back pain. Skin: Negative for rash. Neurological: Negative for headaches, focal weakness or numbness. ____________________________________________  PHYSICAL EXAM:  VITAL SIGNS: ED Triage Vitals  Enc Vitals Group     BP 12/30/15 0943 144/76 mmHg     Pulse Rate 12/30/15 0943 78     Resp 12/30/15 0943 18     Temp 12/30/15 0943 98.4 F (36.9 C)     Temp Source 12/30/15 0943 Oral     SpO2 12/30/15 0943 98 %     Weight 12/30/15 0943 148 lb (67.132 kg)     Height 12/30/15 0943 5\' 2"  (1.575 m)     Head Cir --      Peak Flow --      Pain Score 12/30/15 0943 10     Pain Loc --      Pain Edu? --      Excl. in GC? --    Constitutional: Alert and oriented. Well appearing and in no distress. Head: Normocephalic and atraumatic.      Eyes: Conjunctivae are injected on the left. PERRL. Normal extraocular movements. Patient with a pinpoint defect  over the left cornea. Flourosein dye uptake noted in the same.       Ears: Canals clear. TMs intact bilaterally.   Nose: No congestion/rhinorrhea.   Mouth/Throat: Mucous membranes are moist.   Neck: Supple. No thyromegaly. Hematological/Lymphatic/Immunological: No cervical or preauricular lymphadenopathy. Cardiovascular: Normal rate, regular rhythm.  Respiratory: Normal respiratory effort.  Musculoskeletal: Nontender with normal range of motion in all extremities.  Neurologic:  Normal gait without ataxia. Normal speech and language. No gross focal neurologic deficits are appreciated. Skin:  Skin is warm, dry and intact. No rash noted. Psychiatric: Mood and affect are normal. Patient exhibits appropriate insight and judgment. ____________________________________________  PROCEDURES  Erythromycin ophthalmic ointment OU ____________________________________________  INITIAL IMPRESSION / ASSESSMENT AND PLAN / ED COURSE  Patient with an acute corneal ulcer to the left eye and reactive to arthritis. She'll be discharged with a prescription for Garamycin ointment and Acular to dose as directed for infection and pain control was actively. She will [facility] as needed. ____________________________________________  FINAL CLINICAL IMPRESSION(S) / ED DIAGNOSES  Final diagnoses:  Corneal ulcer, left  Conjunctivitis of left eye     Lissa HoardJenise V Bacon Briza Bark, PA-C 12/30/15 1908  Governor Rooksebecca Lord, MD 12/31/15 1140

## 2015-12-30 NOTE — ED Notes (Signed)
Woke up with left eye pain and drainage this am   Min swelling noted   Eye is irritated

## 2015-12-30 NOTE — ED Notes (Signed)
Pt c/o left eye pain with redness and discharge since yesterday

## 2016-10-26 ENCOUNTER — Encounter: Payer: Self-pay | Admitting: Emergency Medicine

## 2016-10-26 ENCOUNTER — Emergency Department
Admission: EM | Admit: 2016-10-26 | Discharge: 2016-10-26 | Disposition: A | Discharge status: Home / Self Care | Payer: Self-pay | Attending: Emergency Medicine | Admitting: Emergency Medicine

## 2016-10-26 ENCOUNTER — Emergency Department: Payer: Self-pay

## 2016-10-26 DIAGNOSIS — Z95810 Presence of automatic (implantable) cardiac defibrillator: Secondary | ICD-10-CM | POA: Insufficient documentation

## 2016-10-26 DIAGNOSIS — Z79899 Other long term (current) drug therapy: Secondary | ICD-10-CM | POA: Insufficient documentation

## 2016-10-26 DIAGNOSIS — F17210 Nicotine dependence, cigarettes, uncomplicated: Secondary | ICD-10-CM | POA: Insufficient documentation

## 2016-10-26 DIAGNOSIS — I10 Essential (primary) hypertension: Secondary | ICD-10-CM | POA: Insufficient documentation

## 2016-10-26 DIAGNOSIS — R42 Dizziness and giddiness: Secondary | ICD-10-CM

## 2016-10-26 DIAGNOSIS — B349 Viral infection, unspecified: Secondary | ICD-10-CM | POA: Insufficient documentation

## 2016-10-26 DIAGNOSIS — Z7982 Long term (current) use of aspirin: Secondary | ICD-10-CM | POA: Insufficient documentation

## 2016-10-26 DIAGNOSIS — R0789 Other chest pain: Secondary | ICD-10-CM | POA: Insufficient documentation

## 2016-10-26 LAB — URINALYSIS, COMPLETE (UACMP) WITH MICROSCOPIC
Bacteria, UA: NONE SEEN
Bilirubin Urine: NEGATIVE
Glucose, UA: NEGATIVE mg/dL
HGB URINE DIPSTICK: NEGATIVE
KETONES UR: NEGATIVE mg/dL
Leukocytes, UA: NEGATIVE
NITRITE: NEGATIVE
Protein, ur: NEGATIVE mg/dL
RBC / HPF: NONE SEEN RBC/hpf (ref 0–5)
Specific Gravity, Urine: 1.008 (ref 1.005–1.030)
pH: 6 (ref 5.0–8.0)

## 2016-10-26 LAB — BASIC METABOLIC PANEL
ANION GAP: 7 (ref 5–15)
BUN: 23 mg/dL — ABNORMAL HIGH (ref 6–20)
CALCIUM: 8.6 mg/dL — AB (ref 8.9–10.3)
CO2: 25 mmol/L (ref 22–32)
Chloride: 102 mmol/L (ref 101–111)
Creatinine, Ser: 1.27 mg/dL — ABNORMAL HIGH (ref 0.44–1.00)
GFR calc non Af Amer: 47 mL/min — ABNORMAL LOW (ref >60)
GFR, EST AFRICAN AMERICAN: 54 mL/min — AB (ref >60)
Glucose, Bld: 120 mg/dL — ABNORMAL HIGH (ref 65–99)
Potassium: 4.3 mmol/L (ref 3.5–5.1)
Sodium: 134 mmol/L — ABNORMAL LOW (ref 135–145)

## 2016-10-26 LAB — CBC
HCT: 41.1 % (ref 35.0–47.0)
HEMOGLOBIN: 13.3 g/dL (ref 12.0–16.0)
MCH: 27.7 pg (ref 26.0–34.0)
MCHC: 32.4 g/dL (ref 32.0–36.0)
MCV: 85.6 fL (ref 80.0–100.0)
Platelets: 109 10*3/uL — ABNORMAL LOW (ref 150–440)
RBC: 4.8 MIL/uL (ref 3.80–5.20)
RDW: 16.1 % — ABNORMAL HIGH (ref 11.5–14.5)
WBC: 7.3 10*3/uL (ref 3.6–11.0)

## 2016-10-26 LAB — TROPONIN I: TROPONIN I: 0.03 ng/mL — AB (ref <0.03)

## 2016-10-26 LAB — GLUCOSE, CAPILLARY: Glucose-Capillary: 136 mg/dL — ABNORMAL HIGH (ref 65–99)

## 2016-10-26 MED ORDER — HYDROCODONE-HOMATROPINE 5-1.5 MG/5ML PO SYRP: 5.0000 mL | ORAL_SOLUTION | Freq: Four times a day (QID) | ORAL | 0 refills | Status: DC | PRN

## 2016-10-26 NOTE — ED Notes (Signed)
ED Provider at bedside. 

## 2016-10-26 NOTE — Discharge Instructions (Signed)
You have been seen in the Emergency Department (ED) today for a likely viral illness.  All of your lab work was normal, your chest x-ray was normal, and your EKG was the same as your last one in 2016.  Please drink plenty of clear fluids (water, Gatorade, chicken broth, etc) because you are likely fighting off a viral illness and need plenty of hydration to keep up your strength - that is most likely the reason you are feeling dizzy at times.  You may use Tylenol and/or Motrin according to label instructions.  You can alternate between the two without any side effects.   Please follow up with your doctor as listed above.  Call your doctor or return to the Emergency Department (ED) if you are unable to tolerate fluids due to vomiting, have worsening trouble breathing, become extremely tired or difficult to awaken, or if you develop any other symptoms that concern you.

## 2016-10-26 NOTE — ED Triage Notes (Signed)
Patient to ER for c/o dizziness since yesterday. Reports she has "hot and cold flashes". Patient reports having chest tightness and congestion. Mild amount of runny nose, cough. Patient has defibrillator.

## 2016-10-26 NOTE — ED Provider Notes (Signed)
[facility] Emergency Department Provider Note  ____________________________________________   None    (approximate)  I have reviewed the triage vital signs and the nursing notes.   HISTORY  Chief Complaint Dizziness    HPI Heather Choi is a 56 y.o. female who presents by private vehicle for a constellation of symptoms.  She reports that for 2-3 days she has had nasal congestion and runny nose, cough, pain in her chest when she coughs, flashes of hot followed by chills, generalized body aches, and general malaise. She has had several episodes when she is standing up and moving around more she feels like she might pass out so she feels "dizzy" (lightheaded).  It resolves after she sits back down and rests.  She describes her symptoms as severe and nothing makes it better or worse.  Her primary care doctor is Dr. Dyke Brackett.   Past Medical History:  Diagnosis Date  . Arthritis   . Bell's palsy   . Enlarged heart   . Hypertension   . Thyroid disease     There are no active problems to display for this patient.   Past Surgical History:  Procedure Laterality Date  . ABDOMINAL HYSTERECTOMY    . CHOLECYSTECTOMY    . IMPLANTABLE CARDIOVERTER DEFIBRILLATOR IMPLANT      Prior to Admission medications   Medication Sig Start Date End Date Taking? Authorizing Provider  ALPRAZolam Prudy Feeler) 0.5 MG tablet Take 0.5 mg by mouth at bedtime as needed for anxiety (only takes as needed.).    Historical Provider, MD  aspirin EC 81 MG tablet Take 1 tablet by mouth daily.    Historical Provider, MD  carvedilol (COREG) 25 MG tablet Take 1 tablet by mouth 2 (two) times daily. 04/29/15   Historical Provider, MD  clindamycin (CLEOCIN) 300 MG capsule Take 1 capsule (300 mg total) by mouth 3 (three) times daily. 05/26/15   Rebecka Apley, MD  furosemide (LASIX) 40 MG tablet Take 0.5 tablets by mouth daily. 12/09/14   Historical Provider, MD  gentamicin (GARAMYCIN) 0.3 %  ophthalmic solution Place 2 drops into the left eye every 4 (four) hours. 12/30/15   Jenise V Bacon Menshew, PA-C  HYDROcodone-homatropine (HYCODAN) 5-1.5 MG/5ML syrup Take 5 mLs by mouth every 6 (six) hours as needed for cough. 10/26/16   Loleta Rose, MD  isosorbide mononitrate (IMDUR) 30 MG 24 hr tablet Take 1 tablet by mouth daily. 05/13/15   Historical Provider, MD  ketorolac (ACULAR) 0.5 % ophthalmic solution Place 1 drop into the left eye 4 (four) times daily. 12/30/15   Jenise V Bacon Menshew, PA-C  levothyroxine (SYNTHROID, LEVOTHROID) 150 MCG tablet Take 1 tablet by mouth daily. Take 1 tablet every morning with water on an empty stomach 30-60 minutes before breakfast. 03/05/14   Historical Provider, MD  lisinopril (PRINIVIL,ZESTRIL) 40 MG tablet Take 1 tablet by mouth daily. 04/29/15 04/28/16  Historical Provider, MD  potassium chloride SA (K-DUR,KLOR-CON) 20 MEQ tablet Take 1 tablet by mouth 2 (two) times daily. 04/29/15   Historical Provider, MD  spironolactone (ALDACTONE) 25 MG tablet Take 1 tablet by mouth daily. 04/29/15   Historical Provider, MD    Allergies Amlodipine; Penicillins; and Hydrochlorothiazide  No family history on file.  Social History Social History  Substance Use Topics  . Smoking status: Current Every Day Smoker    Types: Cigarettes  . Smokeless tobacco: Never Used  . Alcohol use No     Comment: rarely    Review  of Systems Constitutional: Subjective fever and chills Eyes: No visual changes. ENT: No sore throat. Cardiovascular: Intermittent chest pain associated with cough Respiratory: Mild shortness of breath with cough Gastrointestinal: No abdominal pain.  Nausea, no vomiting.  Multiple recent Episodes of loose stools.  No constipation. Genitourinary: Negative for dysuria. Musculoskeletal: Negative for back pain. Skin: Negative for rash. Neurological: Negative for headaches, focal weakness or numbness.  Several episodes of feeling lightheaded  10-point ROS  otherwise negative.  ____________________________________________   PHYSICAL EXAM:  VITAL SIGNS: ED Triage Vitals  Enc Vitals Group     BP 10/26/16 1103 101/68     Pulse Rate 10/26/16 1103 76     Resp 10/26/16 1103 16     Temp 10/26/16 1103 97.6 F (36.4 C)     Temp Source 10/26/16 1103 Oral     SpO2 10/26/16 1103 99 %     Weight 10/26/16 1103 150 lb (68 kg)     Height 10/26/16 1103 5\' 2"  (1.575 m)     Head Circumference --      Peak Flow --      Pain Score 10/26/16 1406 5     Pain Loc --      Pain Edu? --      Excl. in GC? --     Constitutional: Alert and oriented. Well appearing and in no acute distress. Eyes: Conjunctivae are normal. PERRL. EOMI. Head: Atraumatic. Nose: Mild congestion/rhinnorhea. Mouth/Throat: Mucous membranes are moist.   Neck: No stridor.  No meningeal signs.   Cardiovascular: Normal rate, regular rhythm. Good peripheral circulation. Grossly normal heart sounds.  Patient has an implanted defibrillator Respiratory: Normal respiratory effort.  No retractions. Lungs CTAB. Gastrointestinal: Soft and nontender. No distention.  Musculoskeletal: No lower extremity tenderness nor edema. No gross deformities of extremities. Neurologic:  Normal speech and language. No gross focal neurologic deficits are appreciated.  Skin:  Skin is warm, dry and intact. No rash noted. Psychiatric: Mood and affect are normal. Speech and behavior are normal.  ____________________________________________   LABS (all labs ordered are listed, but only abnormal results are displayed)  Labs Reviewed  BASIC METABOLIC PANEL - Abnormal; Notable for the following:       Result Value   Sodium 134 (*)    Glucose, Bld 120 (*)    BUN 23 (*)    Creatinine, Ser 1.27 (*)    Calcium 8.6 (*)    GFR calc non Af Amer 47 (*)    GFR calc Af Amer 54 (*)    All other components within normal limits  CBC - Abnormal; Notable for the following:    RDW 16.1 (*)    Platelets 109 (*)    All  other components within normal limits  URINALYSIS, COMPLETE (UACMP) WITH MICROSCOPIC - Abnormal; Notable for the following:    Color, Urine YELLOW (*)    APPearance CLEAR (*)    Squamous Epithelial / LPF 0-5 (*)    All other components within normal limits  TROPONIN I - Abnormal; Notable for the following:    Troponin I 0.03 (*)    All other components within normal limits  GLUCOSE, CAPILLARY - Abnormal; Notable for the following:    Glucose-Capillary 136 (*)    All other components within normal limits  CBG MONITORING, ED   ____________________________________________  EKG  ED ECG REPORT I, Marie Borowski, the attending physician, personally viewed and interpreted this ECG.  Date: 10/26/2016 EKG Time: 11:09 AM Rate: 78 Rhythm: normal sinus  rhythm with rare PVC QRS Axis: normal Intervals: normal ST/T Wave abnormalities: T wave inversions in leads V4, V5, and V6 Which were present on prior EKGs from 2016 Conduction Disturbances: none Narrative Interpretation: No evidence of acute ischemia.  ____________________________________________  RADIOLOGY   Dg Chest 2 View  Result Date: 10/26/2016 CLINICAL DATA:  56 year old female with a history of dizziness EXAM: CHEST  2 VIEW COMPARISON:  09/05/2014 FINDINGS: Cardiomediastinal silhouette unchanged. Pacing device/ AICD on the left chest wall. No confluent airspace disease. No pneumothorax. No pleural effusion. No displaced fracture. IMPRESSION: No radiographic evidence of acute cardiopulmonary disease. Unchanged cardiac pacing device and similar appearance of cardiomegaly. Signed, Yvone NeuJaime S. Loreta AveWagner, DO [facility] Electronically Signed   By: Gilmer MorJaime  Wagner D.O.   On: 10/26/2016 12:06    ____________________________________________   PROCEDURES  Procedure(s) performed:   Procedures   Critical Care performed: No ____________________________________________   INITIAL  IMPRESSION / ASSESSMENT AND PLAN / ED COURSE  Pertinent labs & imaging results that were available during my care of the patient were reviewed by me and considered in my medical decision making (see chart for details).  The patient has signs and symptoms consistent with a viral respiratory illness.  Her vital signs are normal as are her labs.  Her troponin is 0.03 which is actually lower than her chronically elevated troponin that is typically in the 0.04 0.05 range.  We are currently in the middle of a flu epidemic and it is possible she is suffering from influenza but there is no point in testing given her stability and the fact that she has had symptoms already for about 3 days.  In fact the hospital system has asked that we did not test people that do not require admission and that are not at extremes of age.  I explained all this to the patient and provided reassurance that she should drink plenty of fluids to stay hydrated which is likely leading to the lightheadedness and that she should follow-up with her regular doctor.  She became very frustrated and asking if I could please give her some amoxicillin.  I spent an extended period of time trying to explain why I did not feel that antibiotics were necessary and in fact would be counterproductive.  She then asked if I could at least give her some prednisone, and I explained why I would not want to do that given that she is not having any wheezing or signs or symptoms of COPD exacerbation and that I am afraid it would have negative side effects such as lowering her immune system.  She told me she feels like she just twisted her time today but I again pointed out that her vital signs, chest x-ray, EKG, lab work, and physical exam are all reassuring and that she should feel comfortable following up with her regular doctor or returning if she gets worse.  She was unhappy with her experiences visit and I apologized to her for that but explained that I am  trying to be a responsible provider and to what is best for her rather than simply providing medications that will not be useful and may have negative side effects.  I encouraged her to call her regular doctor today for the next available follow-up appointment.      ____________________________________________  FINAL CLINICAL IMPRESSION(S) / ED DIAGNOSES  Final diagnoses:  Acute viral syndrome  Atypical chest pain  Dizziness     MEDICATIONS GIVEN DURING THIS  VISIT:  Medications - No data to display   NEW OUTPATIENT MEDICATIONS STARTED DURING THIS VISIT:  New Prescriptions   HYDROCODONE-HOMATROPINE (HYCODAN) 5-1.5 MG/5ML SYRUP    Take 5 mLs by mouth every 6 (six) hours as needed for cough.    Modified Medications   No medications on file    Discontinued Medications   No medications on file     Note:  This document was prepared using Dragon voice recognition software and may include unintentional dictation errors.    Loleta Rose, MD 10/26/16 1452

## 2016-11-11 ENCOUNTER — Emergency Department: Payer: BLUE CROSS/BLUE SHIELD

## 2016-11-11 ENCOUNTER — Observation Stay
Admission: EM | Admit: 2016-11-11 | Discharge: 2016-11-12 | Disposition: A | Discharge status: Home / Self Care | Payer: BLUE CROSS/BLUE SHIELD | Attending: Internal Medicine | Admitting: Internal Medicine

## 2016-11-11 ENCOUNTER — Encounter: Payer: Self-pay | Admitting: Emergency Medicine

## 2016-11-11 ENCOUNTER — Observation Stay
Admit: 2016-11-11 | Discharge: 2016-11-11 | Disposition: A | Discharge status: Home / Self Care | Payer: BLUE CROSS/BLUE SHIELD | Attending: Internal Medicine | Admitting: Internal Medicine

## 2016-11-11 DIAGNOSIS — I110 Hypertensive heart disease with heart failure: Secondary | ICD-10-CM | POA: Insufficient documentation

## 2016-11-11 DIAGNOSIS — I5042 Chronic combined systolic (congestive) and diastolic (congestive) heart failure: Secondary | ICD-10-CM | POA: Diagnosis not present

## 2016-11-11 DIAGNOSIS — F17210 Nicotine dependence, cigarettes, uncomplicated: Secondary | ICD-10-CM | POA: Insufficient documentation

## 2016-11-11 DIAGNOSIS — Z7982 Long term (current) use of aspirin: Secondary | ICD-10-CM | POA: Insufficient documentation

## 2016-11-11 DIAGNOSIS — R079 Chest pain, unspecified: Secondary | ICD-10-CM | POA: Diagnosis present

## 2016-11-11 DIAGNOSIS — E079 Disorder of thyroid, unspecified: Secondary | ICD-10-CM | POA: Diagnosis not present

## 2016-11-11 DIAGNOSIS — Z95810 Presence of automatic (implantable) cardiac defibrillator: Secondary | ICD-10-CM | POA: Insufficient documentation

## 2016-11-11 DIAGNOSIS — Z79899 Other long term (current) drug therapy: Secondary | ICD-10-CM | POA: Diagnosis not present

## 2016-11-11 LAB — CBC WITH DIFFERENTIAL/PLATELET
Basophils Absolute: 0 10*3/uL (ref 0–0.1)
Basophils Relative: 1 %
EOS PCT: 2 %
Eosinophils Absolute: 0.2 10*3/uL (ref 0–0.7)
HCT: 41.5 % (ref 35.0–47.0)
Hemoglobin: 13.8 g/dL (ref 12.0–16.0)
Lymphocytes Relative: 13 %
Lymphs Abs: 0.9 10*3/uL — ABNORMAL LOW (ref 1.0–3.6)
MCH: 28.2 pg (ref 26.0–34.0)
MCHC: 33.1 g/dL (ref 32.0–36.0)
MCV: 85.2 fL (ref 80.0–100.0)
MONO ABS: 0.5 10*3/uL (ref 0.2–0.9)
MONOS PCT: 7 %
Neutro Abs: 5 10*3/uL (ref 1.4–6.5)
Neutrophils Relative %: 77 %
PLATELETS: 101 10*3/uL — AB (ref 150–440)
RBC: 4.87 MIL/uL (ref 3.80–5.20)
RDW: 15.9 % — ABNORMAL HIGH (ref 11.5–14.5)
WBC: 6.5 10*3/uL (ref 3.6–11.0)

## 2016-11-11 LAB — BASIC METABOLIC PANEL
Anion gap: 8 (ref 5–15)
BUN: 28 mg/dL — AB (ref 6–20)
CO2: 25 mmol/L (ref 22–32)
CREATININE: 1.27 mg/dL — AB (ref 0.44–1.00)
Calcium: 9.1 mg/dL (ref 8.9–10.3)
Chloride: 105 mmol/L (ref 101–111)
GFR calc Af Amer: 54 mL/min — ABNORMAL LOW (ref >60)
GFR, EST NON AFRICAN AMERICAN: 47 mL/min — AB (ref >60)
GLUCOSE: 85 mg/dL (ref 65–99)
POTASSIUM: 4.4 mmol/L (ref 3.5–5.1)
SODIUM: 138 mmol/L (ref 135–145)

## 2016-11-11 LAB — URINALYSIS, COMPLETE (UACMP) WITH MICROSCOPIC
BILIRUBIN URINE: NEGATIVE
Glucose, UA: NEGATIVE mg/dL
HGB URINE DIPSTICK: NEGATIVE
Ketones, ur: NEGATIVE mg/dL
LEUKOCYTES UA: NEGATIVE
NITRITE: NEGATIVE
PROTEIN: NEGATIVE mg/dL
SPECIFIC GRAVITY, URINE: 1.005 (ref 1.005–1.030)
pH: 5 (ref 5.0–8.0)

## 2016-11-11 LAB — TROPONIN I
TROPONIN I: 0.03 ng/mL — AB (ref <0.03)
Troponin I: 0.04 ng/mL (ref <0.03)
Troponin I: 0.04 ng/mL (ref <0.03)

## 2016-11-11 LAB — MAGNESIUM: Magnesium: 1.9 mg/dL (ref 1.7–2.4)

## 2016-11-11 MED ORDER — LEVOTHYROXINE SODIUM 150 MCG PO TABS
150.0000 ug | ORAL_TABLET | Freq: Every day | ORAL | Status: DC
  Administered 2016-11-12: 150 ug via ORAL
  Filled 2016-11-11: qty 1

## 2016-11-11 MED ORDER — ALPRAZOLAM 0.5 MG PO TABS: 0.5000 mg | ORAL_TABLET | Freq: Every evening | ORAL | Status: DC | PRN

## 2016-11-11 MED ORDER — ONDANSETRON HCL 4 MG/2ML IJ SOLN: 4.0000 mg | Freq: Four times a day (QID) | INTRAMUSCULAR | Status: DC | PRN

## 2016-11-11 MED ORDER — MORPHINE SULFATE (PF) 4 MG/ML IV SOLN
4.0000 mg | Freq: Once | INTRAVENOUS | Status: AC
  Administered 2016-11-11: 4 mg via INTRAVENOUS
  Filled 2016-11-11: qty 1

## 2016-11-11 MED ORDER — ACETAMINOPHEN 325 MG PO TABS: 650.0000 mg | ORAL_TABLET | ORAL | Status: DC | PRN

## 2016-11-11 MED ORDER — MORPHINE SULFATE (PF) 4 MG/ML IV SOLN
1.0000 mg | INTRAVENOUS | Status: DC | PRN
  Administered 2016-11-12: 1 mg via INTRAVENOUS
  Filled 2016-11-11: qty 1

## 2016-11-11 MED ORDER — SODIUM CHLORIDE 0.9 % IV BOLUS (SEPSIS)
1000.0000 mL | Freq: Once | INTRAVENOUS | Status: AC
  Administered 2016-11-11: 1000 mL via INTRAVENOUS

## 2016-11-11 MED ORDER — MORPHINE SULFATE (PF) 4 MG/ML IV SOLN: 2.0000 mg | INTRAVENOUS | Status: DC | PRN

## 2016-11-11 MED ORDER — ONDANSETRON HCL 4 MG/2ML IJ SOLN: 4.0000 mg | Freq: Once | INTRAMUSCULAR | Status: DC

## 2016-11-11 MED ORDER — OXYCODONE-ACETAMINOPHEN 5-325 MG PO TABS
1.0000 | ORAL_TABLET | Freq: Three times a day (TID) | ORAL | Status: DC | PRN
  Administered 2016-11-11 – 2016-11-12 (×2): 1 via ORAL
  Filled 2016-11-11 (×2): qty 1

## 2016-11-11 MED ORDER — ASPIRIN EC 81 MG PO TBEC
81.0000 mg | DELAYED_RELEASE_TABLET | Freq: Every day | ORAL | Status: DC
  Administered 2016-11-12: 81 mg via ORAL
  Filled 2016-11-11: qty 1

## 2016-11-11 MED ORDER — ASPIRIN 81 MG PO CHEW
243.0000 mg | CHEWABLE_TABLET | Freq: Once | ORAL | Status: AC
  Administered 2016-11-11: 243 mg via ORAL
  Filled 2016-11-11: qty 3

## 2016-11-11 MED ORDER — NITROGLYCERIN 2 % TD OINT: 0.5000 [in_us] | TOPICAL_OINTMENT | Freq: Four times a day (QID) | TRANSDERMAL | Status: DC

## 2016-11-11 MED ORDER — POTASSIUM CHLORIDE CRYS ER 20 MEQ PO TBCR
20.0000 meq | EXTENDED_RELEASE_TABLET | Freq: Two times a day (BID) | ORAL | Status: DC
  Administered 2016-11-11 – 2016-11-12 (×2): 20 meq via ORAL
  Filled 2016-11-11 (×2): qty 1

## 2016-11-11 MED ORDER — HYDROCODONE-HOMATROPINE 5-1.5 MG/5ML PO SYRP: 5.0000 mL | ORAL_SOLUTION | Freq: Four times a day (QID) | ORAL | Status: DC | PRN

## 2016-11-11 MED ORDER — SODIUM CHLORIDE 0.9 % IV SOLN
INTRAVENOUS | Status: DC
  Administered 2016-11-12: 01:00:00 via INTRAVENOUS

## 2016-11-11 MED ORDER — KETOROLAC TROMETHAMINE 0.5 % OP SOLN: 1.0000 [drp] | Freq: Four times a day (QID) | OPHTHALMIC | Status: DC

## 2016-11-11 MED ORDER — ENOXAPARIN SODIUM 40 MG/0.4ML ~~LOC~~ SOLN
40.0000 mg | SUBCUTANEOUS | Status: DC
  Administered 2016-11-11: 40 mg via SUBCUTANEOUS
  Filled 2016-11-11: qty 0.4

## 2016-11-11 NOTE — ED Triage Notes (Signed)
Pt to ed with c/o chest pain, heart racing and sob,  Diaphoresis, weakness.

## 2016-11-11 NOTE — H&P (Signed)
[facility] - [place] at [facility]   PATIENT NAME: Heather Choi    MR#:  [id number]  DATE OF BIRTH:  Dec 20, 1960  DATE OF ADMISSION:  11/11/2016  PRIMARY CARE PHYSICIAN: Pcp Not In System   REQUESTING/REFERRING PHYSICIAN: Myrna Blazer, MD  CHIEF COMPLAINT:  CHEST PAIN  HISTORY OF PRESENT ILLNESS:  Heather Choi  is a 56 y.o. female with a known history of CHF , sees Dr. Ann Maki was as an outpatient, last cardiac catheterization 2 years ago and had defibrillator placement is presenting to the ED with a chief complaint of chest pain which is diffuse radiating to the both shoulders associated with the shortness of breath and nausea. Pain is 6 out of 10. Initial troponin is negative. EKG normal sinus rhythm with PVCs  PAST MEDICAL HISTORY:   Past Medical History:  Diagnosis Date  . Arthritis   . Bell's palsy   . Enlarged heart   . Hypertension   . Thyroid disease     PAST SURGICAL HISTOIRY:   Past Surgical History:  Procedure Laterality Date  . ABDOMINAL HYSTERECTOMY    . CHOLECYSTECTOMY    . IMPLANTABLE CARDIOVERTER DEFIBRILLATOR IMPLANT      SOCIAL HISTORY:   Social History  Substance Use Topics  . Smoking status: Current Every Day Smoker    Types: Cigarettes  . Smokeless tobacco: Never Used  . Alcohol use No     Comment: rarely    FAMILY HISTORY:  Diabetes mellitus-parents and sister  DRUG ALLERGIES:   Allergies  Allergen Reactions  . Amlodipine Other (See Comments) and Itching  . Penicillins Other (See Comments)  . Hydrochlorothiazide Rash    REVIEW OF SYSTEMS:  CONSTITUTIONAL: No fever, fatigue or weakness.  EYES: No blurred or double vision.  EARS, NOSE, AND THROAT: No tinnitus or ear pain.  RESPIRATORY: No cough, shortness of breath, wheezing or hemoptysis.  CARDIOVASCULAR: REPORTS diffuse chest pain radiating to the shoulders, denies orthopnea, edema.  GASTROINTESTINAL: No nausea, vomiting, diarrhea or abdominal  pain.  GENITOURINARY: No dysuria, hematuria.  ENDOCRINE: No polyuria, nocturia,  HEMATOLOGY: No anemia, easy bruising or bleeding SKIN: No rash or lesion. MUSCULOSKELETAL: No joint pain or arthritis.   NEUROLOGIC: No tingling, numbness, weakness.  PSYCHIATRY: No anxiety or depression.   MEDICATIONS AT HOME:   Prior to Admission medications   Medication Sig Start Date End Date Taking? Authorizing Provider  ALPRAZolam Prudy Feeler) 0.5 MG tablet Take 0.5 mg by mouth at bedtime as needed for anxiety (only takes as needed.).   Yes Historical Provider, MD  aspirin EC 81 MG tablet Take 1 tablet by mouth daily.   Yes Historical Provider, MD  carvedilol (COREG) 25 MG tablet Take 1 tablet by mouth 2 (two) times daily. 04/29/15  Yes Historical Provider, MD  furosemide (LASIX) 40 MG tablet Take 20 mg by mouth daily.  12/09/14  Yes Historical Provider, MD  isosorbide mononitrate (IMDUR) 30 MG 24 hr tablet Take 1 tablet by mouth daily. 05/13/15  Yes Historical Provider, MD  levothyroxine (SYNTHROID, LEVOTHROID) 150 MCG tablet Take 1 tablet by mouth daily. Take 1 tablet every morning with water on an empty stomach 30-60 minutes before breakfast. 03/05/14  Yes Historical Provider, MD  lisinopril (PRINIVIL,ZESTRIL) 20 MG tablet Take 20 mg by mouth daily.  04/29/15 11/11/17 Yes Historical Provider, MD  potassium chloride SA (K-DUR,KLOR-CON) 20 MEQ tablet Take 1 tablet by mouth 2 (two) times daily. 04/29/15  Yes Historical Provider, MD  spironolactone (ALDACTONE) 25 MG  tablet Take 25 mg by mouth daily.  04/29/15  Yes Historical Provider, MD  HYDROcodone-homatropine (HYCODAN) 5-1.5 MG/5ML syrup Take 5 mLs by mouth every 6 (six) hours as needed for cough. Patient not taking: Reported on 11/11/2016 10/26/16   Loleta Roseory Forbach, MD  ketorolac (ACULAR) 0.5 % ophthalmic solution Place 1 drop into the left eye 4 (four) times daily. Patient not taking: Reported on 11/11/2016 12/30/15   Charlesetta IvoryJenise V Bacon Menshew, PA-C      VITAL SIGNS:   Blood pressure 111/73, pulse 69, temperature 98.1 F (36.7 C), temperature source Oral, resp. rate 16, weight 68 kg (150 lb), SpO2 97 %.  PHYSICAL EXAMINATION:  GENERAL:  56 y.o.-year-old patient lying in the bed with no acute distress.  EYES: Pupils equal, round, reactive to light and accommodation. No scleral icterus. Extraocular muscles intact.  HEENT: Head atraumatic, normocephalic. Oropharynx and nasopharynx clear.  NECK:  Supple, no jugular venous distention. No thyroid enlargement, no tenderness.  LUNGS: Normal breath sounds bilaterally, no wheezing, rales,rhonchi or crepitation. No use of accessory muscles of respiration.  CARDIOVASCULAR: S1, S2 normal. No murmurs, rubs, or gallops.  ABDOMEN: Soft, nontender, nondistended. Bowel sounds present. No organomegaly or mass.  EXTREMITIES: No pedal edema, cyanosis, or clubbing.  NEUROLOGIC: Cranial nerves II through XII are intact. Muscle strength 5/5 in all extremities. Sensation intact. Gait not checked.  PSYCHIATRIC: The patient is alert and oriented x 3.  SKIN: No obvious rash, lesion, or ulcer.   LABORATORY PANEL:   CBC  Recent Labs Lab 11/11/16 1120  WBC 6.5  HGB 13.8  HCT 41.5  PLT 101*   ------------------------------------------------------------------------------------------------------------------  Chemistries   Recent Labs Lab 11/11/16 1120  NA 138  K 4.4  CL 105  CO2 25  GLUCOSE 85  BUN 28*  CREATININE 1.27*  CALCIUM 9.1  MG 1.9   ------------------------------------------------------------------------------------------------------------------  Cardiac Enzymes  Recent Labs Lab 11/11/16 1120  TROPONINI 0.03*   ------------------------------------------------------------------------------------------------------------------  RADIOLOGY:  Dg Chest 1 View  Result Date: 11/11/2016 CLINICAL DATA:  Chest pain and shortness of breath. EXAM: CHEST 1 VIEW COMPARISON:  October 26, 2016 FINDINGS: Stable  cardiomegaly and AICD device. No pulmonary nodules or masses. No focal infiltrates. No overt edema. IMPRESSION: No active disease. Electronically Signed   By: Gerome Samavid  Williams III M.D   On: 11/11/2016 11:01    EKG:   Orders placed or performed during the hospital encounter of 11/11/16  . EKG 12-Lead  . EKG 12-Lead    IMPRESSION AND PLAN:  Leonette MostCarolyn Caffrey  is a 56 y.o. female with a known history of CHF , sees Dr. Ann MakiParrish was as an outpatient, last cardiac catheterization 2 years ago and had defibrillator placement is presenting to the ED with a chief complaint of chest pain which is diffuse radiating to the both shoulders associated with the shortness of breath and nausea. Pain is 6 out of 10.  # Chest pain rule out acute coronary syndrome Admit to telemetry Cycle cardiac biomarkers. Initial troponin negative Provide aspirin Holding Coreg in view of hypotension [facility]  #Chronic history of congestive heart failure-, combined systolic and diastolic Will get an echocardiogram Holding Lasix and spironolactone, ACE inhibitor and beta blocker in view of hypotension Daily intake and output and weight monitoring  #Essential hypertension Currently patient is hypotensive Provided gentle hydration with IV fluids Hold beta blocker, ACE inhibitor, Lasix and SPIRONOLACTONE   #Chronic thyroid disorder continue Armour's thyroid  #Tobacco abuse disorder Counseled  patient to quit smoking for 6 minutes. Patient verbalized understanding. We'll start her on nicotine patch from tomorrow after ruling out acute MI  GI and DVT prophylaxis   All the Records are reviewed and case discussed with ED provider. Management plans discussed with the patient, sister  and they are in agreement.  CODE STATUS: fc,sister HCPOA  TOTAL TIME TAKING CARE OF THIS PATIENT: 45 minutes.   Note: This dictation was prepared with Dragon dictation along with  smaller phrase technology. Any transcriptional errors that result from this process are unintentional.  Ramonita Lab M.D on 11/11/2016 at 3:30 PM  Between 7am to 6pm - Pager - [phone]  After 6pm go to [email or web address] - password [email or web address]  [facility]  Office  [phone]  CC: Primary care physician; Pcp Not In System

## 2016-11-11 NOTE — ED Notes (Signed)
Assisted pt up to bathroom  

## 2016-11-11 NOTE — ED Provider Notes (Signed)
[facility] Emergency Department Provider Note  ____________________________________________   First MD Initiated Contact with Patient 11/11/16 1046     (approximate)  I have reviewed the triage vital signs and the nursing notes.   HISTORY  Chief Complaint Chest Pain   HPI Heather Choi is a 56 y.o. female with a history of heart failure as well as hypertension presenting to emergency department with central chest pressure which started this morning about 9 AM. She says it lasted for about 1-2 hours. The pain radiated down both of her arms. It was associated with shortness of breath as well as nausea. Says the pain is still a 6 out of 10. Says that she also felt her heart racing at the time when she was having the pain. She says that she has had palpitations twice in the past   Past Medical History:  Diagnosis Date  . Arthritis   . Bell's palsy   . Enlarged heart   . Hypertension   . Thyroid disease     There are no active problems to display for this patient.   Past Surgical History:  Procedure Laterality Date  . ABDOMINAL HYSTERECTOMY    . CHOLECYSTECTOMY    . IMPLANTABLE CARDIOVERTER DEFIBRILLATOR IMPLANT      Prior to Admission medications   Medication Sig Start Date End Date Taking? Authorizing Provider  ALPRAZolam Prudy Feeler) 0.5 MG tablet Take 0.5 mg by mouth at bedtime as needed for anxiety (only takes as needed.).   Yes Historical Provider, MD  aspirin EC 81 MG tablet Take 1 tablet by mouth daily.   Yes Historical Provider, MD  carvedilol (COREG) 25 MG tablet Take 1 tablet by mouth 2 (two) times daily. 04/29/15  Yes Historical Provider, MD  furosemide (LASIX) 40 MG tablet Take 20 mg by mouth daily.  12/09/14  Yes Historical Provider, MD  isosorbide mononitrate (IMDUR) 30 MG 24 hr tablet Take 1 tablet by mouth daily. 05/13/15  Yes Historical Provider, MD  levothyroxine (SYNTHROID, LEVOTHROID) 150 MCG tablet Take 1 tablet by mouth daily. Take 1  tablet every morning with water on an empty stomach 30-60 minutes before breakfast. 03/05/14  Yes Historical Provider, MD  lisinopril (PRINIVIL,ZESTRIL) 20 MG tablet Take 20 mg by mouth daily.  04/29/15 11/11/17 Yes Historical Provider, MD  potassium chloride SA (K-DUR,KLOR-CON) 20 MEQ tablet Take 1 tablet by mouth 2 (two) times daily. 04/29/15  Yes Historical Provider, MD  spironolactone (ALDACTONE) 25 MG tablet Take 25 mg by mouth daily.  04/29/15  Yes Historical Provider, MD  HYDROcodone-homatropine (HYCODAN) 5-1.5 MG/5ML syrup Take 5 mLs by mouth every 6 (six) hours as needed for cough. Patient not taking: Reported on 11/11/2016 10/26/16   Loleta Rose, MD  ketorolac (ACULAR) 0.5 % ophthalmic solution Place 1 drop into the left eye 4 (four) times daily. Patient not taking: Reported on 11/11/2016 12/30/15   Charlesetta Ivory Menshew, PA-C    Allergies Amlodipine; Penicillins; and Hydrochlorothiazide  History reviewed. No pertinent family history.  Social History Social History  Substance Use Topics  . Smoking status: Current Every Day Smoker    Types: Cigarettes  . Smokeless tobacco: Never Used  . Alcohol use No     Comment: rarely    Review of Systems Constitutional: No fever/chills Eyes: No visual changes. ENT: No sore throat. Cardiovascular: as above Respiratory: as above Gastrointestinal: No abdominal pain.  No nausea, no vomiting.   Genitourinary: Negative for dysuria. Musculoskeletal: Negative for back pain. Skin:  Negative for rash. Neurological: Negative for headaches, focal weakness or numbness.  10-point ROS otherwise negative.  ____________________________________________   PHYSICAL EXAM:  VITAL SIGNS:    Enc Vitals Group     BP (!) 132/105     Pulse Rate 62     Resp 20     Temp 98.1 F (36.7 C)     Temp Source Oral     SpO2 97 %     Weight 150 lb (68 kg)     Height      Head Circumference      Peak Flow      Pain Score 6     Pain Loc      Pain Edu?       Excl. in GC?     Constitutional: Alert and oriented. Well appearing and in no acute distress. Eyes: Conjunctivae are normal. PERRL. EOMI. Head: Atraumatic. Nose: No congestion/rhinnorhea. Mouth/Throat: Mucous membranes are moist.   Neck: No stridor.   Cardiovascular: Normal rate, regular rhythm. Grossly normal heart sounds.  Good peripheral circulation With equal bilateral radial pulses. Respiratory: Normal respiratory effort.  No retractions. Lungs CTAB. Gastrointestinal: Soft and nontender. No distention.  Musculoskeletal: No lower extremity tenderness nor edema.  No joint effusions. Neurologic:  Normal speech and language. No gross focal neurologic deficits are appreciated.  Skin:  Skin is warm, dry and intact. No rash noted. Psychiatric: Mood and affect are normal. Speech and behavior are normal.  ____________________________________________   LABS (all labs ordered are listed, but only abnormal results are displayed)  Labs Reviewed  CBC WITH DIFFERENTIAL/PLATELET - Abnormal; Notable for the following:       Result Value   RDW 15.9 (*)    Platelets 101 (*)    Lymphs Abs 0.9 (*)    All other components within normal limits  BASIC METABOLIC PANEL - Abnormal; Notable for the following:    BUN 28 (*)    Creatinine, Ser 1.27 (*)    GFR calc non Af Amer 47 (*)    GFR calc Af Amer 54 (*)    All other components within normal limits  TROPONIN I - Abnormal; Notable for the following:    Troponin I 0.03 (*)    All other components within normal limits  URINALYSIS, COMPLETE (UACMP) WITH MICROSCOPIC - Abnormal; Notable for the following:    Color, Urine STRAW (*)    APPearance CLEAR (*)    Bacteria, UA RARE (*)    Squamous Epithelial / LPF 0-5 (*)    All other components within normal limits  MAGNESIUM   ____________________________________________  EKG  ED ECG REPORT I, Arelia Longest, the attending physician, personally viewed and interpreted this ECG.   Date:  11/11/2016  EKG Time: 1043  Rate: 81  Rhythm: normal sinus rhythm with intermittent trigeminy  Axis: normal  Intervals:lvh  ST&T Change: T wave inversions in 1, 2, aVL as well as V5 and V6. A GU without any significant change from previous.  ____________________________________________  RADIOLOGY  DG Chest 1 View (Final result)  Result time 11/11/16 11:01:19  Final result by Waldo Laine, MD (11/11/16 11:01:19)           Narrative:   CLINICAL DATA: Chest pain and shortness of breath.  EXAM: CHEST 1 VIEW  COMPARISON: October 26, 2016  FINDINGS: Stable cardiomegaly and AICD device. No pulmonary nodules or masses. No focal infiltrates. No overt edema.  IMPRESSION: No active disease.   Electronically Signed  By: Gerome Samavid Williams III M.D On: 11/11/2016 11:01          ____________________________________________   PROCEDURES  Procedure(s) performed:   Procedures  Critical Care performed:   ____________________________________________   INITIAL IMPRESSION / ASSESSMENT AND PLAN / ED COURSE  Pertinent labs & imaging results that were available during my care of the patient were reviewed by me and considered in my medical decision making (see chart for details).  ----------------------------------------- 1:36 PM on 11/11/2016 -----------------------------------------  Asian with minimal chest pressure after morphine. Says that she has an allergy to nitroglycerin that she gets very bad headache with taking it. She'll be admitted to the hospital for a chest pain rule out. She was here about 2 weeks ago for similar symptoms and was able to be discharged home. However, with the combination of symptoms of radiation down both arms as well as nausea and shortness of breath with persistent chest pain after one a morphine if it is prudent to admit her at this time. I sent this to the patient and she is understanding willing to comply. Signed out to Dr.  Amado CoeGouru.        ____________________________________________   FINAL CLINICAL IMPRESSION(S) / ED DIAGNOSES  Chest pain    NEW MEDICATIONS STARTED DURING THIS VISIT:  New Prescriptions   No medications on file     Note:  This document was prepared using Dragon voice recognition software and may include unintentional dictation errors.    Myrna Blazeravid Matthew Canon Gola, MD 11/11/16 [phone]

## 2016-11-11 NOTE — Progress Notes (Signed)
Pt complaining of CP- 5 out of 10. Morphine dose dropped BP in ED. MD to place order for lower dose/nitro. Will continue to monitor.

## 2016-11-12 LAB — LIPID PANEL
Cholesterol: 113 mg/dL (ref 0–200)
HDL: 40 mg/dL — ABNORMAL LOW (ref >40)
LDL CALC: 60 mg/dL (ref 0–99)
Total CHOL/HDL Ratio: 2.8 RATIO
Triglycerides: 66 mg/dL (ref <150)
VLDL: 13 mg/dL (ref 0–40)

## 2016-11-12 LAB — ECHOCARDIOGRAM COMPLETE
HEIGHTINCHES: 62 in
WEIGHTICAEL: 2385.6 [oz_av]

## 2016-11-12 LAB — TSH: TSH: 0.434 u[IU]/mL (ref 0.350–4.500)

## 2016-11-12 MED ORDER — CARVEDILOL 25 MG PO TABS: 25.0000 mg | ORAL_TABLET | Freq: Every day | ORAL | 0 refills | Status: DC

## 2016-11-12 NOTE — Progress Notes (Signed)
Pt complaining of shortness of breath; NS at 75 since midnight. MD, Dr. Sheryle Hailiamond ordered to hold NS until cardiology rounds. Will continue to monitor. Heather Choi, Tangy Drozdowski H

## 2016-11-12 NOTE — Discharge Instructions (Signed)
Dizziness Dizziness is a common problem. It makes you feel unsteady or lightheaded. You may feel like you are about to pass out (faint). Dizziness can lead to injury if you stumble or fall. Anyone can get dizzy, but dizziness is more common in older adults. This condition can be caused by a number of things, including:  Medicines.  Dehydration.  Illness. Follow these instructions at home: Following these instructions may help with your condition: Eating and drinking  Drink enough fluid to keep your pee (urine) clear or pale yellow. This helps to keep you from getting dehydrated. Try to drink more clear fluids, such as water.  Do not drink alcohol.  Limit how much caffeine you drink or eat if told by your doctor.  Limit how much salt you drink or eat if told by your doctor. Activity  Avoid making quick movements.  When you stand up from sitting in a chair, steady yourself until you feel okay.  In the morning, first sit up on the side of the bed. When you feel okay, stand slowly while you hold onto something. Do this until you know that your balance is fine.  Move your legs often if you need to stand in one place for a long time. Tighten and relax your muscles in your legs while you are standing.  Do not drive or use heavy machinery if you feel dizzy.  Avoid bending down if you feel dizzy. Place items in your home so that they are easy for you to reach without leaning over. Lifestyle  Do not use any tobacco products, including cigarettes, chewing tobacco, or electronic cigarettes. If you need help quitting, ask your doctor.  Try to lower your stress level, such as with yoga or meditation. Talk with your doctor if you need help. General instructions  Watch your dizziness for any changes.  Take medicines only as told by your doctor. Talk with your doctor if you think that your dizziness is caused by a medicine that you are taking.  Tell a friend or a family member that you are  feeling dizzy. If he or she notices any changes in your behavior, have this person call your doctor.  Keep all follow-up visits as told by your doctor. This is important. Contact a doctor if:  Your dizziness does not go away.  Your dizziness or light-headedness gets worse.  You feel sick to your stomach (nauseous).  You have trouble hearing.  You have new symptoms.  You are unsteady on your feet or you feel like the room is spinning. Get help right away if:  You throw up (vomit) or have diarrhea and are unable to eat or drink anything.  You have trouble:  Talking.  Walking.  Swallowing.  Using your arms, hands, or legs.  You feel generally weak.  You are not thinking clearly or you have trouble forming sentences. It may take a friend or family member to notice this.  You have:  Chest pain.  Pain in your belly (abdomen).  Shortness of breath.  Sweating.  Your vision changes.  You are bleeding.  You have a headache.  You have neck pain or a stiff neck.  You have a fever. This information is not intended to replace advice given to you by your health care provider. Make sure you discuss any questions you have with your health care provider. Document Released: 09/07/2011 Document Revised: 02/24/2016 Document Reviewed: 09/14/2014 Elsevier Interactive Patient Education  2017 [employer]  

## 2016-11-12 NOTE — Consult Note (Signed)
[facility]  Patient ID: Heather PersiaCarolyn W Choi, MRN: [id number], DOB/AGE: 55/05/1961 55 y.o. Admit date: 11/11/2016   Date of Consult: 11/12/2016 Primary Physician: Pcp Not In System Primary Cardiologist: Paraschos  Chief Complaint:  Chief Complaint  Patient presents with  . Chest Pain   Reason for Consult: heart failure  HPI: 56 y.o. 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 The patient was placed on appropriate medication management for cardiomyopathy and is done fairly well for many years and did not have any appropriate follow-up to medication management. The patient has a defibrillator as well for which has been checked on a regular basis with no evidence of ventricular tachycardia or need for changes. The patient now comes in with multiple days of fluid retention shortness of breath chest discomfort and hypotension. The patient with this chest discomfort has had an EKG showing normal sinus rhythm with very frequent preventricular contractions as well as a troponin level of 0.04 more consistent with demand ischemia rather than acute coronary syndrome. The patient did have discontinuation of medication management due to this hypotension and does feel quite a bit better. There has been multiple issues with the palpitations as well as some dizziness more consistent with the hypotension. Since discontinuation of medication management and further observation the patient is feeling better  Past Medical History:  Diagnosis Date  . Arthritis   . Bell's palsy   . Enlarged heart   . Hypertension   . Thyroid disease       Surgical History:  Past Surgical History:  Procedure Laterality Date  . ABDOMINAL HYSTERECTOMY    . CHOLECYSTECTOMY    . IMPLANTABLE CARDIOVERTER DEFIBRILLATOR IMPLANT       Home Meds: Prior to  Admission medications   Medication Sig Start Date End Date Taking? Authorizing Provider  ALPRAZolam Prudy Feeler(XANAX) 0.5 MG tablet Take 0.5 mg by mouth at bedtime as needed for anxiety (only takes as needed.).   Yes Historical Provider, MD  aspirin EC 81 MG tablet Take 1 tablet by mouth daily.   Yes Historical Provider, MD  carvedilol (COREG) 25 MG tablet Take 1 tablet by mouth 2 (two) times daily. 04/29/15  Yes Historical Provider, MD  furosemide (LASIX) 40 MG tablet Take 20 mg by mouth daily.  12/09/14  Yes Historical Provider, MD  isosorbide mononitrate (IMDUR) 30 MG 24 hr tablet Take 1 tablet by mouth daily. 05/13/15  Yes Historical Provider, MD  levothyroxine (SYNTHROID, LEVOTHROID) 150 MCG tablet Take 1 tablet by mouth daily. Take 1 tablet every morning with water on an empty stomach 30-60 minutes before breakfast. 03/05/14  Yes Historical Provider, MD  lisinopril (PRINIVIL,ZESTRIL) 20 MG tablet Take 20 mg by mouth daily.  04/29/15 11/11/17 Yes Historical Provider, MD  potassium chloride SA (K-DUR,KLOR-CON) 20 MEQ tablet Take 1 tablet by mouth 2 (two) times daily. 04/29/15  Yes Historical Provider, MD  spironolactone (ALDACTONE) 25 MG tablet Take 25 mg by mouth daily.  04/29/15  Yes Historical Provider, MD  HYDROcodone-homatropine (HYCODAN) 5-1.5 MG/5ML syrup Take 5 mLs by mouth every 6 (six) hours as needed for cough. Patient not taking: Reported on 11/11/2016 10/26/16   Loleta Roseory Forbach, MD  ketorolac (ACULAR) 0.5 % ophthalmic solution Place 1 drop into the left eye 4 (four) times daily. Patient not taking: Reported on 11/11/2016 12/30/15   Lissa HoardJenise V Bacon Menshew, PA-C  Inpatient Medications:  . aspirin EC  81 mg Oral Daily  . enoxaparin (LOVENOX) injection  40 mg Subcutaneous Q24H  . levothyroxine  150 mcg Oral QAC breakfast  . nitroGLYCERIN  0.5 inch Topical Q6H  . potassium chloride SA  20 mEq Oral BID   . sodium chloride Stopped (11/12/16 0518)    Allergies:  Allergies  Allergen Reactions  .  Amlodipine Other (See Comments) and Itching  . Penicillins Other (See Comments)  . Hydrochlorothiazide Rash    Social History   Social History  . Marital status: Single    Spouse name: N/A  . Number of children: N/A  . Years of education: N/A   Occupational History  . Not on file.   Social History Main Topics  . Smoking status: Current Every Day Smoker    Types: Cigarettes  . Smokeless tobacco: Never Used  . Alcohol use No     Comment: rarely  . Drug use: No  . Sexual activity: Not on file   Other Topics Concern  . Not on file   Social History Narrative  . No narrative on file     History reviewed. No pertinent family history.   Review of Systems Positive for Palpitations dizziness shortness of breath chest pain Negative for: General:  chills, fever, night sweats or weight changes.  Cardiovascular: PND orthopnea syncope positive for dizziness  Dermatological skin lesions rashes Respiratory: Cough congestion Urologic: Frequent urination urination at night and hematuria Abdominal: negative for nausea, vomiting, diarrhea, bright red blood per rectum, melena, or hematemesis Neurologic: negative for visual changes, and/or hearing changes  All other systems reviewed and are otherwise negative except as noted above.  Labs:  Recent Labs  11/11/16 1120 11/11/16 1637 11/11/16 2247  TROPONINI 0.03* 0.04* 0.04*   Lab Results  Component Value Date   WBC 6.5 11/11/2016   HGB 13.8 11/11/2016   HCT 41.5 11/11/2016   MCV 85.2 11/11/2016   PLT 101 (L) 11/11/2016    Recent Labs Lab 11/11/16 1120  NA 138  K 4.4  CL 105  CO2 25  BUN 28*  CREATININE 1.27*  CALCIUM 9.1  GLUCOSE 85   No results found for: CHOL, HDL, LDLCALC, TRIG No results found for: DDIMER  Radiology/Studies:  Dg Chest 1 View  Result Date: 11/11/2016 CLINICAL DATA:  Chest pain and shortness of breath. EXAM: CHEST 1 VIEW COMPARISON:  October 26, 2016 FINDINGS: Stable cardiomegaly and AICD  device. No pulmonary nodules or masses. No focal infiltrates. No overt edema. IMPRESSION: No active disease. Electronically Signed   By: Gerome Sam III M.D   On: 11/11/2016 11:01   Dg Chest 2 View  Result Date: 10/26/2016 CLINICAL DATA:  56 year old female with a history of dizziness EXAM: CHEST  2 VIEW COMPARISON:  09/05/2014 FINDINGS: Cardiomediastinal silhouette unchanged. Pacing device/ AICD on the left chest wall. No confluent airspace disease. No pneumothorax. No pleural effusion. No displaced fracture. IMPRESSION: No radiographic evidence of acute cardiopulmonary disease. Unchanged cardiac pacing device and similar appearance of cardiomegaly. Signed, Yvone Neu. Loreta Ave, DO [facility] Radiology Electronically Signed   By: Gilmer Mor D.O.   On: 10/26/2016 12:06    EKG: Normal sinus rhythm with frequent preventricular contractions  Weights: Filed Weights   11/11/16 1038 11/11/16 1631  Weight: 68 kg (150 lb) 67.6 kg (149 lb 1.6 oz)     Physical Exam: Blood pressure (!) 99/55, pulse 94, temperature 98.2 F (36.8 C), temperature source Oral,  resp. rate 18, height 5\' 2"  (1.575 m), weight 67.6 kg (149 lb 1.6 oz), SpO2 96 %. Body mass index is 27.27 kg/m. General: Well developed, well nourished, in no acute distress. Head eyes ears nose throat: Normocephalic, atraumatic, sclera non-icteric, no xanthomas, nares are without discharge. No apparent thyromegaly and/or mass  Lungs: Normal respiratory effort.  no wheezes, Few rales, no rhonchi.  Heart: Irregular with normal S1 S2. no murmur gallop, no rub, PMI is normal size and placement, carotid upstroke normal without bruit, jugular venous pressure is normal Abdomen: Soft, non-tender, non-distended with normoactive bowel sounds. No hepatomegaly. No rebound/guarding. No obvious abdominal masses. Abdominal aorta is normal size without bruit Extremities: Trace edema. no cyanosis, no clubbing, no  ulcers  Peripheral : 2+ bilateral upper extremity pulses, 2+ bilateral femoral pulses, 2+ bilateral dorsal pedal pulse Neuro: Alert and oriented. No facial asymmetry. No focal deficit. Moves all extremities spontaneously. Musculoskeletal: Normal muscle tone without kyphosis Psych:  Responds to questions appropriately with a normal affect.    Assessment: 56 year old female with a history of postpartum cardiomyopathy and LV systolic dysfunction with hypotension and dizziness and palpitations with preventricular contractions and minimal elevation of troponin consistent with demand ischemia rather than acute coronary syndrome improved with adjustments of medication management  Plan: 1. Begin ambulation and follow for improvements of symptoms a and need for further adjustments of medication management 2. Echocardiogram for LV systolic dysfunction valvular heart disease or changes in heart dysfunction 3. No further cardiac workup of minimal elevation of troponin more consistent with demand ischemia rather than acute coronary syndrome X 4. Possible reintroduction of the diuretic including Lasix and holding other medication management due to fluid retention and concerns of hypotension 5. Stable discharged home if improved with further adjustments of medication management as an outpatient  Signed, Lamar Blinks M.D. [facility] Cardiology 11/12/2016, 6:47 AM

## 2016-11-12 NOTE — Progress Notes (Signed)
Spoke to Dr. Gwen PoundsKowalski after he had seen pt; gave verbal order for low salt diet.

## 2016-11-12 NOTE — Progress Notes (Signed)
[employer] [facility] [place] at [facility]        Heather Choi was admitted to the Hospital on 11/11/2016 and Discharged  11/12/2016 and should be excused from work for 1 days starting 11/11/2016 , may return to work without any restrictions on 02//09/2017  [name] M.D on 11/12/2016,at 10:51 AM  [employer] [facility] [place] [facility]    Office  [phone]

## 2016-11-12 NOTE — Progress Notes (Signed)
Discharge instructions given. IV and tele removed. Education given on heart failure, and blood pressure. Instructed to call cardiologist if BP is low at home before taking medications. Patient verbalized understanding.

## 2016-11-13 NOTE — Discharge Summary (Signed)
[employer] [facility] [place] at [facility]   PATIENT NAME: Heather Choi    MR#:  [id number]  DATE OF BIRTH:  11/27/1960  DATE OF ADMISSION:  11/11/2016   ADMITTING PHYSICIAN: Ramonita LabAruna Gouru, MD  DATE OF DISCHARGE: 11/12/2016 11:45 AM  PRIMARY CARE PHYSICIAN: BABAOFF, MARC E, MD   ADMISSION DIAGNOSIS:  Chest pain, unspecified type [R07.9] DISCHARGE DIAGNOSIS:  Active Problems:   Chest pain  SECONDARY DIAGNOSIS:   Past Medical History:  Diagnosis Date  . Arthritis   . Bell's palsy   . Enlarged heart   . Hypertension   . Thyroid disease    HOSPITAL COURSE:  Heather Choi  is a 56 y.o. female with a known history of CHF, sees Dr. Darrold JunkerParaschos was seen as an outpatient, last cardiac catheterization 2 years ago and had defibrillator placement is presenting to the ED with a chief complaint of chest pain which is diffuse radiating to the both shoulders associated with the shortness of breath and nausea. Pain is 6 out of 10.  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 Her BP/HR has been running low so I've stopped lisinopril and cut back on coreg. She is telling me she only takes coreg once daily instead of twice daily (which is what her med rec says) - I've asked to check her BP/HR and based on her symptoms hold coreg if she's feeling dizzi. She is in agreement with same. DISCHARGE CONDITIONS:  stable CONSULTS OBTAINED:  Treatment Team:  Lamar BlinksBruce J Kowalski, MD DRUG ALLERGIES:   Allergies  Allergen Reactions  . Amlodipine Other (See Comments) and Itching  . Penicillins Other (See Comments)  . Hydrochlorothiazide Rash   DISCHARGE MEDICATIONS:   Allergies as of 11/12/2016      Reactions   Amlodipine Other (See Comments), Itching   Penicillins Other (See Comments)   Hydrochlorothiazide Rash      Medication List    STOP taking these medications   lisinopril 20 MG tablet Commonly known as:  PRINIVIL,ZESTRIL     TAKE these medications   ALPRAZolam 0.5 MG tablet Commonly known as:  XANAX Take 0.5 mg by mouth at bedtime as needed for anxiety (only takes as needed.).   aspirin EC 81 MG tablet Take 1 tablet by mouth daily.   carvedilol 25 MG tablet Commonly known as:  COREG Take 1 tablet (25 mg total) by mouth daily. Consider holding if SBP < 100 or HR < 60 or feeling dizzi What changed:  when to take this  additional instructions   furosemide 40 MG tablet Commonly known as:  LASIX Take 20 mg by mouth daily.   HYDROcodone-homatropine 5-1.5 MG/5ML syrup Commonly known as:  HYCODAN Take 5 mLs by mouth every 6 (six) hours as needed for cough.   isosorbide mononitrate 30 MG 24 hr tablet Commonly known as:  IMDUR Take 1 tablet by mouth daily.   ketorolac 0.5 % ophthalmic solution Commonly known as:  ACULAR Place 1 drop into the left eye 4 (four) times daily.   levothyroxine 150 MCG tablet Commonly known as:  SYNTHROID, LEVOTHROID Take 1 tablet by mouth daily. Take 1 tablet every morning with water on an empty stomach 30-60 minutes before  breakfast.   potassium chloride SA 20 MEQ tablet Commonly known as:  K-DUR,KLOR-CON Take 1 tablet by mouth 2 (two) times daily.   spironolactone 25 MG tablet Commonly known as:  ALDACTONE Take 25 mg by mouth daily.      DISCHARGE INSTRUCTIONS:   DIET:  Regular diet DISCHARGE CONDITION:  Good ACTIVITY:  Activity as tolerated OXYGEN:  Home Oxygen: No.  Oxygen Delivery: room air DISCHARGE LOCATION:  home   If you experience worsening of your admission symptoms, develop shortness of breath, life threatening emergency, suicidal or homicidal thoughts you must seek medical attention immediately by  calling 911 or calling your MD immediately  if symptoms less severe.  You Must read complete instructions/literature along with all the possible adverse reactions/side effects for all the Medicines you take and that have been prescribed to you. Take any new Medicines after you have completely understood and accpet all the possible adverse reactions/side effects.   Please note  You were cared for by a hospitalist during your hospital stay. If you have any questions about your discharge medications or the care you received while you were in the hospital after you are discharged, you can call the unit and asked to speak with the hospitalist on call if the hospitalist that took care of you is not available. Once you are discharged, your primary care physician will handle any further medical issues. Please note that NO REFILLS for any discharge medications will be authorized once you are discharged, as it is imperative that you return to your primary care physician (or establish a relationship with a primary care physician if you do not have one) for your aftercare needs so that they can reassess your need for medications and monitor your lab values.    On the day of Discharge:  VITAL SIGNS:  Blood pressure 115/73, pulse 75, temperature 98.3 F (36.8 C), temperature source Oral, resp. rate 18, height 5\' 2"  (1.575 m), weight 67.6 kg (149 lb 1.6 oz), SpO2 98 %. PHYSICAL EXAMINATION:  GENERAL:  56 y.o.-year-old patient lying in the bed with no acute distress.  EYES: Pupils equal, round, reactive to light and accommodation. No scleral icterus. Extraocular muscles intact.  HEENT: Head atraumatic, normocephalic. Oropharynx and nasopharynx clear.  NECK:  Supple, no jugular venous distention. No thyroid enlargement, no tenderness.  LUNGS: Normal breath sounds bilaterally, no wheezing, rales,rhonchi or crepitation. No use of accessory muscles of respiration.  CARDIOVASCULAR: S1, S2 normal. No murmurs, rubs, or  gallops.  ABDOMEN: Soft, non-tender, non-distended. Bowel sounds present. No organomegaly or mass.  EXTREMITIES: No pedal edema, cyanosis, or clubbing.  NEUROLOGIC: Cranial nerves II through XII are intact. Muscle strength 5/5 in all extremities. Sensation intact. Gait not checked.  PSYCHIATRIC: The patient is alert and oriented x 3.  SKIN: No obvious rash, lesion, or ulcer.  DATA REVIEW:   CBC  Recent Labs Lab 11/11/16 1120  WBC 6.5  HGB 13.8  HCT 41.5  PLT 101*    Chemistries   Recent Labs Lab 11/11/16 1120  NA 138  K 4.4  CL 105  CO2 25  GLUCOSE 85  BUN 28*  CREATININE 1.27*  CALCIUM 9.1  MG 1.9     Follow-up Information    BABAOFF, MARC E, MD. Schedule an appointment as soon as possible for a visit in 1 week(s).   Specialty:  Family Medicine Contact information: [address] [facility] [place] [address] [phone]  Marcina Millard, MD. Schedule an appointment as soon as possible for a visit in 2 week(s).   Specialty:  Cardiology Contact information: [address] [facility] [place] [address] [phone]           Management plans discussed with the patient, family and they are in agreement.  CODE STATUS: FULL CODE  TOTAL TIME TAKING CARE OF THIS PATIENT: 45 minutes.    Delfino Lovett M.D on 11/13/2016 at 2:51 PM  Between 7am to 6pm - Pager - [phone]  After 6pm go to [email or web address] - Social research officer, government  [facility] [place] [facility]  Office  [phone]  CC: Primary care physician; BABAOFF, Lavada Mesi, MD   Note: This dictation was prepared with Dragon dictation along with smaller phrase technology. Any transcriptional errors that result from this process are unintentional.

## 2016-12-02 ENCOUNTER — Emergency Department: Payer: BLUE CROSS/BLUE SHIELD

## 2016-12-02 ENCOUNTER — Emergency Department
Admission: EM | Admit: 2016-12-02 | Discharge: 2016-12-02 | Disposition: A | Discharge status: Home / Self Care | Payer: BLUE CROSS/BLUE SHIELD | Attending: Emergency Medicine | Admitting: Emergency Medicine

## 2016-12-02 ENCOUNTER — Encounter: Payer: Self-pay | Admitting: Emergency Medicine

## 2016-12-02 DIAGNOSIS — Z79899 Other long term (current) drug therapy: Secondary | ICD-10-CM | POA: Insufficient documentation

## 2016-12-02 DIAGNOSIS — R0602 Shortness of breath: Secondary | ICD-10-CM | POA: Diagnosis not present

## 2016-12-02 DIAGNOSIS — I471 Supraventricular tachycardia: Secondary | ICD-10-CM | POA: Insufficient documentation

## 2016-12-02 DIAGNOSIS — Z7982 Long term (current) use of aspirin: Secondary | ICD-10-CM | POA: Insufficient documentation

## 2016-12-02 DIAGNOSIS — I10 Essential (primary) hypertension: Secondary | ICD-10-CM | POA: Insufficient documentation

## 2016-12-02 DIAGNOSIS — F17210 Nicotine dependence, cigarettes, uncomplicated: Secondary | ICD-10-CM | POA: Diagnosis not present

## 2016-12-02 DIAGNOSIS — R0789 Other chest pain: Secondary | ICD-10-CM | POA: Diagnosis present

## 2016-12-02 LAB — BASIC METABOLIC PANEL
ANION GAP: 7 (ref 5–15)
BUN: 17 mg/dL (ref 6–20)
CO2: 25 mmol/L (ref 22–32)
Calcium: 9 mg/dL (ref 8.9–10.3)
Chloride: 105 mmol/L (ref 101–111)
Creatinine, Ser: 1.47 mg/dL — ABNORMAL HIGH (ref 0.44–1.00)
GFR calc non Af Amer: 39 mL/min — ABNORMAL LOW (ref >60)
GFR, EST AFRICAN AMERICAN: 46 mL/min — AB (ref >60)
Glucose, Bld: 246 mg/dL — ABNORMAL HIGH (ref 65–99)
POTASSIUM: 4.8 mmol/L (ref 3.5–5.1)
Sodium: 137 mmol/L (ref 135–145)

## 2016-12-02 LAB — CBC
HEMATOCRIT: 44.7 % (ref 35.0–47.0)
HEMOGLOBIN: 14.1 g/dL (ref 12.0–16.0)
MCH: 27.5 pg (ref 26.0–34.0)
MCHC: 31.6 g/dL — ABNORMAL LOW (ref 32.0–36.0)
MCV: 86.9 fL (ref 80.0–100.0)
Platelets: 110 10*3/uL — ABNORMAL LOW (ref 150–440)
RBC: 5.14 MIL/uL (ref 3.80–5.20)
RDW: 15.9 % — ABNORMAL HIGH (ref 11.5–14.5)
WBC: 6.6 10*3/uL (ref 3.6–11.0)

## 2016-12-02 LAB — BRAIN NATRIURETIC PEPTIDE: B Natriuretic Peptide: 584 pg/mL — ABNORMAL HIGH (ref 0.0–100.0)

## 2016-12-02 LAB — TROPONIN I
TROPONIN I: 0.03 ng/mL — AB (ref <0.03)
Troponin I: 0.04 ng/mL (ref <0.03)

## 2016-12-02 LAB — MAGNESIUM: MAGNESIUM: 1.9 mg/dL (ref 1.7–2.4)

## 2016-12-02 MED ORDER — SODIUM CHLORIDE 0.9 % IV BOLUS (SEPSIS)
500.0000 mL | Freq: Once | INTRAVENOUS | Status: AC
  Administered 2016-12-02: 500 mL via INTRAVENOUS

## 2016-12-02 NOTE — ED Notes (Signed)
No fax received from first interrogation. ICD interrogated again. Awaiting fax.

## 2016-12-02 NOTE — Discharge Instructions (Addendum)
You have been seen in the Emergency Department (ED) today for palpitations with chest discomfort.  As we have discussed today?s test results are normal, but you may require further testing.  STOP taking isosorbide at the recommendation of Dr. Cassie FreerParachos.   Please follow up with the recommended doctor as instructed above in these documents regarding today?s emergent visit and your recent symptoms to discuss further management.  Continue to take your regular medications. If you are not doing so already, please also take a daily baby aspirin (81 mg), at least until you follow up with your doctor.  Return to the Emergency Department (ED) if you experience any further chest pain/pressure/tightness, difficulty breathing, or sudden sweating, or other symptoms that concern you.

## 2016-12-02 NOTE — ED Notes (Signed)
[employer] rep here for ICD interrogation.

## 2016-12-02 NOTE — ED Notes (Signed)
Spoke with local [employer] rep, Rayna SextonRalph. States he is on his way from [place] and it will be at least an hour. Rep notified this is pending discharge/admission. MD notified.

## 2016-12-02 NOTE — ED Provider Notes (Signed)
[facility] Emergency Department Provider Note   ____________________________________________   First MD Initiated Contact with Patient 12/02/16 1152     (approximate)  I have reviewed the triage vital signs and the nursing notes.   HISTORY  Chief Complaint Chest Pain  HPI Heather Choi is a 56 y.o. female here for evaluation of racing heart and chest pressure.  Just prior to arrival, patient was standing at work at a [employer] line. She reports that she suddenly felt as her heart was racing rapidly, she began explains pressure across her chest, felt nauseated and lightheaded as though she is going to pass out. She did not pass out, she took 2 ibuprofen tablets, and take aspirin already daily. She reports that she felt very panicky, sweaty and nauseated and hot. After a brief time, maybe 15 minutes her symptoms started to improve. She now knows feeling just a slight feeling of pressure in the chest, but no further racing of the heart.  She is not gaining any weight. Her ankles are not swollen. She continues to take all of her regular medicine, except was stopped her lisinopril after she was told to stop the last time she was discharged from the hospital because her blood pressures were low  She does have an ICD. It did not discharge  Past Medical History:  Diagnosis Date  . Arthritis   . Bell's palsy   . Enlarged heart   . Hypertension   . Thyroid disease     Patient Active Problem List   Diagnosis Date Noted  . Chest pain 11/11/2016    Past Surgical History:  Procedure Laterality Date  . ABDOMINAL HYSTERECTOMY    . CHOLECYSTECTOMY    . IMPLANTABLE CARDIOVERTER DEFIBRILLATOR IMPLANT      Prior to Admission medications   Medication Sig Start Date End Date Taking? Authorizing Provider  ALPRAZolam Prudy Feeler(XANAX) 0.5 MG tablet Take 0.5 mg by mouth at bedtime as needed for anxiety (only takes as needed.).   Yes Historical Provider, MD  aspirin EC 81 MG  tablet Take 1 tablet by mouth daily.   Yes Historical Provider, MD  carvedilol (COREG) 25 MG tablet Take 1 tablet (25 mg total) by mouth daily. Consider holding if SBP < 100 or HR < 60 or feeling dizzi 11/12/16  Yes Vipul Sherryll BurgerShah, MD  furosemide (LASIX) 40 MG tablet Take 20 mg by mouth daily.  12/09/14  Yes Historical Provider, MD  levothyroxine (SYNTHROID, LEVOTHROID) 150 MCG tablet Take 1 tablet by mouth daily. Take 1 tablet every morning with water on an empty stomach 30-60 minutes before breakfast. 03/05/14  Yes Historical Provider, MD  potassium chloride SA (K-DUR,KLOR-CON) 20 MEQ tablet Take 1 tablet by mouth 2 (two) times daily. 04/29/15  Yes Historical Provider, MD  spironolactone (ALDACTONE) 25 MG tablet Take 25 mg by mouth daily.  04/29/15  Yes Historical Provider, MD  HYDROcodone-homatropine (HYCODAN) 5-1.5 MG/5ML syrup Take 5 mLs by mouth every 6 (six) hours as needed for cough. Patient not taking: Reported on 11/11/2016 10/26/16   Loleta Roseory Forbach, MD  ketorolac (ACULAR) 0.5 % ophthalmic solution Place 1 drop into the left eye 4 (four) times daily. Patient not taking: Reported on 11/11/2016 12/30/15   Charlesetta IvoryJenise V Bacon Menshew, PA-C    Allergies Amlodipine; Penicillins; and Hydrochlorothiazide  History reviewed. No pertinent family history.  Social History Social History  Substance Use Topics  . Smoking status: Current Every Day Smoker    Types: Cigarettes  . Smokeless tobacco:  Never Used  . Alcohol use No     Comment: rarely    Review of Systems Constitutional: No fever/chills Eyes: No visual changes. ENT: No sore throat. Cardiovascular: See history of present illness Respiratory: Denies shortness of breath. Felt like she couldn't catch her breath during the episode, but denies any feeling of shortness of breath now. No cough. Gastrointestinal: No abdominal pain.  No nausea, no vomiting.  No diarrhea.  No constipation. Genitourinary: Negative for dysuria. Musculoskeletal: Negative for  back pain. Skin: Negative for rash. Neurological: Negative for headaches, focal weakness or numbness.  10-point ROS otherwise negative.  ____________________________________________   PHYSICAL EXAM:  VITAL SIGNS: ED Triage Vitals  Enc Vitals Group     BP 12/02/16 1043 (!) 89/62     Pulse Rate 12/02/16 1043 85     Resp 12/02/16 1043 20     Temp 12/02/16 1043 97.6 F (36.4 C)     Temp Source 12/02/16 1043 Oral     SpO2 12/02/16 1043 94 %     Weight 12/02/16 1036 150 lb (68 kg)     Height 12/02/16 1036 5\' 2"  (1.575 m)     Head Circumference --      Peak Flow --      Pain Score 12/02/16 1042 6     Pain Loc --      Pain Edu? --      Excl. in GC? --     Constitutional: Alert and oriented. Well appearing and in no acute distress. Eyes: Conjunctivae are normal. PERRL. EOMI. Head: Atraumatic. Nose: No congestion/rhinnorhea. Mouth/Throat: Mucous membranes are moist.  Oropharynx non-erythematous. Neck: No stridor.   Cardiovascular: Normal rate, regular rhythm. Grossly normal heart sounds.  Good peripheral circulation.ICD implant in the left upper chest. Respiratory: Normal respiratory effort.  No retractions. Lungs CTAB. Gastrointestinal: Soft and nontender. No distention. Musculoskeletal: No lower extremity tenderness nor edema.   Neurologic:  Normal speech and language. No gross focal neurologic deficits are appreciated.  Skin:  Skin is warm, dry and intact. No rash noted. Psychiatric: Mood and affect are normal. Speech and behavior are normal.  ____________________________________________   LABS (all labs ordered are listed, but only abnormal results are displayed)  Labs Reviewed  BASIC METABOLIC PANEL - Abnormal; Notable for the following:       Result Value   Glucose, Bld 246 (*)    Creatinine, Ser 1.47 (*)    GFR calc non Af Amer 39 (*)    GFR calc Af Amer 46 (*)    All other components within normal limits  CBC - Abnormal; Notable for the following:    MCHC 31.6  (*)    RDW 15.9 (*)    Platelets 110 (*)    All other components within normal limits  TROPONIN I - Abnormal; Notable for the following:    Troponin I 0.04 (*)    All other components within normal limits  BRAIN NATRIURETIC PEPTIDE - Abnormal; Notable for the following:    B Natriuretic Peptide 584.0 (*)    All other components within normal limits  TROPONIN I - Abnormal; Notable for the following:    Troponin I 0.03 (*)    All other components within normal limits  MAGNESIUM   ____________________________________________  EKG  Reviewed and to revive me at 1040 Digit rate 90 QRS 90 QTc about 500 Probable left ventricular hypertrophy, and no masses a T-wave abnormality is noted in the lateral leads, including slight flattening, compared with the  patient's previous EKG from February 2018, essentially unchanged. ____________________________________________  RADIOLOGY  Dg Chest 2 View  Result Date: 12/02/2016 CLINICAL DATA:  Pt reports central chest pain, dizziness and nausea. Pt states she was at work when pain started. Pt also reports tingling in bilateral arms as well. EXAM: CHEST  2 VIEW COMPARISON:  11/11/2016 FINDINGS: Left-sided transvenous pacemaker leads to the right atrium and right ventricle. Heart is mildly enlarged. There are no focal consolidations or pleural effusions. No evidence for pulmonary edema. Surgical clips are noted in the right upper quadrant the abdomen. IMPRESSION: Mild cardiomegaly. Electronically Signed   By: Norva Pavlov M.D.   On: 12/02/2016 11:15    ____________________________________________   PROCEDURES  Procedure(s) performed: None  Procedures  Critical Care performed: No  ____________________________________________   INITIAL IMPRESSION / ASSESSMENT AND PLAN / ED COURSE  Pertinent labs & imaging results that were available during my care of the patient were reviewed by me and considered in my medical decision making (see chart for  details).    Clinical Course as of Dec 02 1633  Sat Dec 02, 2016  1419 Patient resting comfortably. Blood pressure remains mildly low at 91/66, patient asymptomatic.  [MQ]  1419 Awaiting pacemaker interpretation to return.  [MQ]  1434 [employer] reported that their transmission system is undergoing maintenance, no reports will be available until 8 PM tonight. They will dispatch a representative to come and interrogate, but ETA not yet known  [MQ]  1446 D/W Dr. Celene Kras. Knows patient and reviewed records and today presentation and blood pressures. Recommends discontinue IMDUR. Continue remainin meds and discharge if interrogation shows not concerns.   [MQ]    Clinical Course User Index [MQ] Sharyn Creamer, MD   ----------------------------------------- 4:35 PM on 12/02/2016 -----------------------------------------  Patient seen by [employer] representative, interrogation of her pacemaker is noted to show an episode of SVT occurring during the patient's symptomatology. This is now resolved, the patient a dramatic. She does carry a diagnosis of SVT. I'm confident that his symptoms are caused by an episode of SVT, her blood pressure has improved, spoke with her cardiologist we will discontinue her Imdur and she'll follow-up early this week in his clinic.  Return precautions and treatment recommendations and follow-up discussed with the patient who is agreeable with the plan.   ____________________________________________   FINAL CLINICAL IMPRESSION(S) / ED DIAGNOSES  Final diagnoses:  Paroxysmal SVT (supraventricular tachycardia) (HCC)      NEW MEDICATIONS STARTED DURING THIS VISIT:  Current Discharge Medication List       Note:  This document was prepared using Dragon voice recognition software and may include unintentional dictation errors.     Sharyn Creamer, MD 12/02/16 [phone]

## 2016-12-02 NOTE — ED Triage Notes (Addendum)
Pt reports central chest pain, dizziness and nausea.  Pt states she was at work when pain started. Pt also reports tingling in bilateral arms as well.

## 2016-12-02 NOTE — ED Notes (Signed)
Patient states she was recently admitted for same complaint. Patient states, "They messed me up. They gave me morphine. Too much. It dropped by blood pressure and it hasn't come up since. My primary doctor took me off lisnopril".

## 2016-12-02 NOTE — ED Notes (Signed)
Spoke with Greig CastillaAndrew from [employer]. He states we will not get a fax due to maintenance. This RN requested a local representative to come to interrogate ICD to get report. MD notified and aware.

## 2016-12-02 NOTE — ED Notes (Signed)
No faxed received from [employer]. MD and charge RN aware. Attempted to call [employer] headquarters. No answer.

## 2016-12-02 NOTE — ED Notes (Signed)
ICD interrogated by this RN. Awaiting fax. MD notified.

## 2017-07-26 ENCOUNTER — Encounter: Payer: Self-pay | Admitting: Emergency Medicine

## 2017-07-26 ENCOUNTER — Emergency Department
Admission: EM | Admit: 2017-07-26 | Discharge: 2017-07-26 | Disposition: A | Discharge status: Home / Self Care | Payer: BLUE CROSS/BLUE SHIELD | Attending: Emergency Medicine | Admitting: Emergency Medicine

## 2017-07-26 ENCOUNTER — Emergency Department: Payer: BLUE CROSS/BLUE SHIELD

## 2017-07-26 DIAGNOSIS — I10 Essential (primary) hypertension: Secondary | ICD-10-CM | POA: Diagnosis not present

## 2017-07-26 DIAGNOSIS — R002 Palpitations: Secondary | ICD-10-CM | POA: Diagnosis present

## 2017-07-26 DIAGNOSIS — R300 Dysuria: Secondary | ICD-10-CM | POA: Diagnosis not present

## 2017-07-26 DIAGNOSIS — Z7982 Long term (current) use of aspirin: Secondary | ICD-10-CM | POA: Insufficient documentation

## 2017-07-26 DIAGNOSIS — I471 Supraventricular tachycardia: Secondary | ICD-10-CM | POA: Diagnosis not present

## 2017-07-26 DIAGNOSIS — R079 Chest pain, unspecified: Secondary | ICD-10-CM

## 2017-07-26 DIAGNOSIS — Z79899 Other long term (current) drug therapy: Secondary | ICD-10-CM | POA: Insufficient documentation

## 2017-07-26 DIAGNOSIS — F17210 Nicotine dependence, cigarettes, uncomplicated: Secondary | ICD-10-CM | POA: Diagnosis not present

## 2017-07-26 LAB — URINALYSIS, COMPLETE (UACMP) WITH MICROSCOPIC
Bacteria, UA: NONE SEEN
Bilirubin Urine: NEGATIVE
Glucose, UA: 150 mg/dL — AB
Hgb urine dipstick: NEGATIVE
KETONES UR: NEGATIVE mg/dL
Leukocytes, UA: NEGATIVE
Nitrite: NEGATIVE
PROTEIN: NEGATIVE mg/dL
Specific Gravity, Urine: 1.001 — ABNORMAL LOW (ref 1.005–1.030)
WBC UA: NONE SEEN WBC/hpf (ref 0–5)
pH: 6 (ref 5.0–8.0)

## 2017-07-26 LAB — CBC
HCT: 46.3 % (ref 35.0–47.0)
Hemoglobin: 14.7 g/dL (ref 12.0–16.0)
MCH: 27.2 pg (ref 26.0–34.0)
MCHC: 31.9 g/dL — ABNORMAL LOW (ref 32.0–36.0)
MCV: 85.4 fL (ref 80.0–100.0)
Platelets: 130 10*3/uL — ABNORMAL LOW (ref 150–440)
RBC: 5.41 MIL/uL — ABNORMAL HIGH (ref 3.80–5.20)
RDW: 16.3 % — ABNORMAL HIGH (ref 11.5–14.5)
WBC: 6.7 10*3/uL (ref 3.6–11.0)

## 2017-07-26 LAB — TSH: TSH: 2.247 u[IU]/mL (ref 0.350–4.500)

## 2017-07-26 LAB — COMPREHENSIVE METABOLIC PANEL
ALK PHOS: 48 U/L (ref 38–126)
ALT: 15 U/L (ref 14–54)
ANION GAP: 10 (ref 5–15)
AST: 24 U/L (ref 15–41)
Albumin: 3.7 g/dL (ref 3.5–5.0)
BILIRUBIN TOTAL: 0.7 mg/dL (ref 0.3–1.2)
BUN: 13 mg/dL (ref 6–20)
CALCIUM: 9.2 mg/dL (ref 8.9–10.3)
CO2: 24 mmol/L (ref 22–32)
CREATININE: 0.41 mg/dL — AB (ref 0.44–1.00)
Chloride: 103 mmol/L (ref 101–111)
GFR calc non Af Amer: >60 mL/min (ref >60)
GLUCOSE: 170 mg/dL — AB (ref 65–99)
Potassium: 4 mmol/L (ref 3.5–5.1)
SODIUM: 137 mmol/L (ref 135–145)
TOTAL PROTEIN: 6.7 g/dL (ref 6.5–8.1)

## 2017-07-26 LAB — T4, FREE: Free T4: 0.99 ng/dL (ref 0.61–1.12)

## 2017-07-26 LAB — DIGOXIN LEVEL: Digoxin Level: 3.3 ng/mL (ref 0.8–2.0)

## 2017-07-26 LAB — TROPONIN I
TROPONIN I: 0.04 ng/mL — AB (ref <0.03)
Troponin I: 0.04 ng/mL (ref <0.03)

## 2017-07-26 MED ORDER — SODIUM CHLORIDE 0.9 % IV BOLUS (SEPSIS)
1000.0000 mL | Freq: Once | INTRAVENOUS | Status: AC
  Administered 2017-07-26: 1000 mL via INTRAVENOUS

## 2017-07-26 MED ORDER — ADENOSINE 6 MG/2ML IV SOLN
6.0000 mg | Freq: Once | INTRAVENOUS | Status: AC
  Administered 2017-07-26: 6 mg via INTRAVENOUS

## 2017-07-26 MED ORDER — DIGOXIN 62.5 MCG PO TABS: 0.0625 mg | ORAL_TABLET | Freq: Every day | ORAL | 0 refills | Status: DC

## 2017-07-26 MED ORDER — ASPIRIN 81 MG PO CHEW
243.0000 mg | CHEWABLE_TABLET | Freq: Once | ORAL | Status: AC
  Administered 2017-07-26: 243 mg via ORAL
  Filled 2017-07-26: qty 3

## 2017-07-26 NOTE — ED Notes (Signed)
 6mg  of adenosine given by Jae DireKate, RN at this time.

## 2017-07-26 NOTE — ED Notes (Signed)
Date and time results received: 07/26/17 1922  Test: Digoxin  Critical Value: 3.3  Name of Provider Notified: Dr. Don PerkingVeronese

## 2017-07-26 NOTE — ED Provider Notes (Signed)
[facility] Emergency Department Provider Note  ____________________________________________  Time seen: Approximately 6:47 PM  I have reviewed the triage vital signs and the nursing notes.   HISTORY  Chief Complaint Palpitations   HPI Heather Choi is a 56 y.o. 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 Patient reports that her symptoms started a few hours ago while she was at work and have been constant. She is having palpitations and is also complaining of moderate central constant chest pressure and shortness of breath. She has had several episodes of SVT in the past however describes that the pain in her chest is more severe this time. She endorses compliance with her medications. No recent illness, no cough, no fever, no nausea, no vomiting, no diarrhea.no personal or family history of blood clots, no recent travel or immobilization, no leg pain or swelling, no hemoptysis, no exogenous hormones.  Past Medical History:  Diagnosis Date  . Arthritis   . Bell's palsy   . Enlarged heart   . Hypertension   . Thyroid disease     Patient Active Problem List   Diagnosis Date Noted  . Chest pain 11/11/2016    Past Surgical History:  Procedure Laterality Date  . ABDOMINAL HYSTERECTOMY    . CHOLECYSTECTOMY    . IMPLANTABLE CARDIOVERTER DEFIBRILLATOR IMPLANT      Prior to Admission medications   Medication Sig Start Date End Date Taking? Authorizing Provider  ALPRAZolam Prudy Feeler) 0.5 MG tablet Take 0.5 mg by mouth at bedtime as needed for anxiety (only takes as needed.).    [provider]  aspirin EC 81 MG tablet Take 1 tablet by mouth daily.    [provider]  carvedilol (COREG) 25 MG tablet Take 1 tablet (25 mg total) by mouth daily. Consider holding if SBP < 100 or HR < 60 or feeling dizzi 11/12/16   Delfino Lovett, MD  Digoxin 62.5 MCG TABS Take 0.0625 mg by mouth daily. 07/29/17 07/29/18  Nita Sickle, MD  furosemide (LASIX) 40 MG tablet Take 20 mg by mouth daily.  12/09/14   [provider]  HYDROcodone-homatropine (HYCODAN) 5-1.5 MG/5ML syrup Take 5 mLs by mouth every 6 (six) hours as needed for cough. Patient not taking: Reported on 11/11/2016 10/26/16   Loleta Rose, MD  ketorolac (ACULAR) 0.5 % ophthalmic solution Place 1 drop into the left eye 4 (four) times daily. Patient not taking: Reported on 11/11/2016 12/30/15   Menshew, Charlesetta Ivory, PA-C  levothyroxine (SYNTHROID, LEVOTHROID) 150 MCG tablet Take 1 tablet by mouth daily. Take 1 tablet every morning with water on an empty stomach 30-60 minutes before breakfast. 03/05/14   [provider]  potassium chloride SA (K-DUR,KLOR-CON) 20 MEQ tablet Take 1 tablet by mouth 2 (two) times daily. 04/29/15   [provider]  spironolactone (ALDACTONE) 25 MG tablet Take 25 mg by mouth daily.  04/29/15   [provider]    Allergies Amlodipine; Penicillins; and Hydrochlorothiazide  No family history on file.  Social History Social History  Substance Use Topics  . Smoking status: Current Every Day Smoker    Types: Cigarettes  . Smokeless tobacco: Never Used  . Alcohol use No     Comment: rarely    Review of Systems  Constitutional: Negative for fever. Eyes: Negative for visual changes. ENT: Negative for sore throat. Neck: No neck pain  Cardiovascular: +chest  tightness and palpitations Respiratory: +  shortness of breath. Gastrointestinal: Negative for abdominal pain, vomiting or diarrhea. Genitourinary: Negative for dysuria. Musculoskeletal: Negative for back pain. Skin: Negative for rash. Neurological: Negative for headaches, weakness or numbness. Psych: No SI or HI  ____________________________________________   PHYSICAL EXAM:  VITAL SIGNS: ED Triage Vitals  Enc Vitals Group     BP  07/26/17 1825 (!) 89/63     Pulse Rate 07/26/17 1825 (!) 195     Resp 07/26/17 1825 20     Temp 07/26/17 1825 98.2 F (36.8 C)     Temp Source 07/26/17 1825 Oral     SpO2 07/26/17 1825 96 %     Weight --      Height --      Head Circumference --      Peak Flow --      Pain Score 07/26/17 1829 8     Pain Loc --      Pain Edu? --      Excl. in GC? --     Constitutional: Alert and oriented. Well appearing and in no apparent distress. HEENT:      Head: Normocephalic and atraumatic.         Eyes: Conjunctivae are normal. Sclera is non-icteric.       Mouth/Throat: Mucous membranes are moist.       Neck: Supple with no signs of meningismus. Cardiovascular: Tachycardic rate with regular rhythm. No murmurs, gallops, or rubs. 2+ symmetrical distal pulses are present in all extremities. No JVD. Respiratory: Normal respiratory effort. Lungs are clear to auscultation bilaterally. No wheezes, crackles, or rhonchi.  Gastrointestinal: Soft, non tender, and non distended with positive bowel sounds. No rebound or guarding. Musculoskeletal: Nontender with normal range of motion in all extremities. No edema, cyanosis, or erythema of extremities. Neurologic: Normal speech and language. Face is symmetric. Moving all extremities. No gross focal neurologic deficits are appreciated. Skin: Skin is warm, dry and intact. No rash noted. Psychiatric: Mood and affect are normal. Speech and behavior are normal.  ____________________________________________   LABS (all labs ordered are listed, but only abnormal results are displayed)  Labs Reviewed  CBC - Abnormal; Notable for the following:       Result Value   RBC 5.41 (*)    MCHC 31.9 (*)    RDW 16.3 (*)    Platelets 130 (*)    All other components within normal limits  TROPONIN I - Abnormal; Notable for the following:    Troponin I 0.04 (*)    All other components within normal limits  DIGOXIN LEVEL - Abnormal; Notable for the following:     Digoxin Level 3.3 (*)    All other components within normal limits  COMPREHENSIVE METABOLIC PANEL - Abnormal; Notable for the following:    Glucose, Bld 170 (*)    Creatinine, Ser 0.41 (*)    All other components within normal limits  URINALYSIS, COMPLETE (UACMP) WITH MICROSCOPIC - Abnormal; Notable for the following:    Color, Urine COLORLESS (*)    APPearance CLEAR (*)    Specific Gravity, Urine 1.001 (*)    Glucose, UA 150 (*)    Squamous Epithelial / LPF 0-5 (*)    All other components within normal limits  TROPONIN I - Abnormal; Notable for the following:    Troponin I 0.04 (*)    All other components within normal limits  TSH  T4, FREE   ____________________________________________  EKG  ED ECG REPORT I,  Nita Sicklearolina Arthella Headings, the attending physician, personally viewed and interpreted this ECG.  18:24 - SVT, rate of 201, normal QTC, normal axis, no ST elevations or depressions.  18:35 - SVT, rate of 144, normal axis, no ST elevations or depressions.  18:35 - sinus tachycardia with frequent PVCs, rate of 139, normal QTC, normal axis, no ST elevations or depressions.  18:36 - sinus tachycardia, rate of 1:30, normal intervals, normal axis, no ST elevations or depressions. ____________________________________________  RADIOLOGY  CXR: negative ____________________________________________   PROCEDURES  Procedure(s) performed: None Procedures Critical Care performed:  None ____________________________________________   INITIAL IMPRESSION / ASSESSMENT AND PLAN / ED COURSE  56 y.o. 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 patient was connected to the Zoll machine. Initial EKG concerning for SVT with a rate of 201. Patient hypotensive. IV fluids were started. Patient received 6 mg of IV adenosine with conversion to sinus tachycardia which  improved after 500cc of IV fluids. Once patient's rate was below 90 her chest heaviness and shortness of breath resolved. Labs are pending including the digoxin level, thyroid hormone, electrolytes and kidney function. We'll continue to monitor on telemetry.  Clinical Course as of Jul 27 740  Thu Jul 26, 2017  1941 Digoxin level elevated at 3.3. Discussed with Dr. Juliann Paresallwood who recommended no digibind. He is concerned for AKI due to high dig level in the setting of really low dose of digoxin. Creatinine is pending. He recommended holding digoxin for 2 days and restarting it at half a dose.   [CV]  2021 Patient complaining of dysuria for a few days. UA pending. Labs with no acute findings other than dig level. Second troponin has been ordered due to patient's significant chest pressure during her episode of SVT. Plan to dc home on Dr. Glennis Brinkallwood's recs and close f/u with Dr. Darrold JunkerParaschos if 2nd troponin is not significantly increased. Care transferred to Dr. Scotty CourtStafford  [CV]    Clinical Course User Index [CV] Don PerkingVeronese, [place], MD     As part of my medical decision making, I reviewed the following data within the electronic MEDICAL RECORD NUMBER Nursing notes reviewed and incorporated, Labs reviewed , EKG interpreted , Old EKG reviewed, Old chart reviewed, Patient signed out to Dr. Scotty CourtStafford, Radiograph reviewed , A consult was requested and obtained from this/these consultant(s) Cardiology, Notes from prior ED visits and [place] Controlled Substance Database    Pertinent labs & imaging results that were available during my care of the patient were reviewed by me and considered in my medical decision making (see chart for details).    ____________________________________________   FINAL CLINICAL IMPRESSION(S) / ED DIAGNOSES  Final diagnoses:  SVT (supraventricular tachycardia) (HCC)  Dysuria      NEW MEDICATIONS STARTED DURING THIS VISIT:  Discharge Medication List as of 07/26/2017 10:29 PM      START taking these medications   Details  Digoxin 62.5 MCG TABS Take 0.0625 mg by mouth daily., Starting Sun 07/29/2017, Until Mon 07/29/2018, Print         Note:  This document was prepared using Dragon voice recognition software and may include unintentional dictation errors.    Nita SickleVeronese, [place], MD 07/27/17 [phone]

## 2017-07-26 NOTE — ED Notes (Signed)
Pt requesting information on when she is expected to be discharged since her heart rate is now WNL. RN caring for patient has been notified to deliver update.

## 2017-07-26 NOTE — ED Notes (Signed)
Pt cardioverted with 6mg  adenosine. Initial rhythm SVT, rate of 202. Rate immediately reduced to 100-130 after adenosine given. EDP at bedside adenosine given without printing on continuous strip d/t power cord malfunction. EKGs completed before and after administration.

## 2017-07-26 NOTE — ED Triage Notes (Signed)
Patient presents to the ED with palpitations that began 1 hour ago.  Patient reports chest pain and left arm feeling heavy.  Patient has history of SVT and defibrillator.

## 2017-07-26 NOTE — ED Provider Notes (Signed)
Assumed care of the patient from Dr. Don PerkingVeronese to follow up on urinalysis and second troponin. Delta troponin is unchanged. Urinalysis unremarkable. Vital signs remain stable and normal. Patient feels back to normal and is eager to go home. Follow-up plan and digoxin dose adjustment in place by Dr. Don PerkingVeronese. Stable for discharge home at this time.  Final diagnoses:  SVT (supraventricular tachycardia) (HCC)  Dysuria      Heather Choi, Heather Cumpton, MD 07/26/17 2230

## 2017-07-26 NOTE — Discharge Instructions (Signed)
Hold your digoxin for the next two days and then take half of the dose you are taking now. I am providing you with a prescription for the new dose. Follow up with your cardiologist in 2 days. Return to the emergency room if you have new episode of chest pain, palpitations, shortness of breath, fever, abdominal pain, nausea vomiting, changes in vision, or anything else concerning to you.

## 2017-07-26 NOTE — ED Notes (Signed)
Date and time results received: 07/26/17 1935  Test: troponin  Critical Value: 0.04  Name of Provider Notified: Dr. Don PerkingVeronese

## 2017-07-26 NOTE — ED Notes (Signed)
Pt urine output prior to me entering the room was 650cc. Pt was assisted to the restroom and the new urine output amount is 400cc.

## 2017-10-29 ENCOUNTER — Emergency Department: Payer: BLUE CROSS/BLUE SHIELD

## 2017-10-29 ENCOUNTER — Inpatient Hospital Stay
Admission: EM | Admit: 2017-10-29 | Discharge: 2017-10-31 | DRG: 190 | Disposition: A | Discharge status: Home / Self Care | Payer: BLUE CROSS/BLUE SHIELD | Attending: Internal Medicine | Admitting: Internal Medicine

## 2017-10-29 DIAGNOSIS — E039 Hypothyroidism, unspecified: Secondary | ICD-10-CM | POA: Diagnosis present

## 2017-10-29 DIAGNOSIS — R1900 Intra-abdominal and pelvic swelling, mass and lump, unspecified site: Secondary | ICD-10-CM

## 2017-10-29 DIAGNOSIS — Z95810 Presence of automatic (implantable) cardiac defibrillator: Secondary | ICD-10-CM

## 2017-10-29 DIAGNOSIS — J441 Chronic obstructive pulmonary disease with (acute) exacerbation: Principal | ICD-10-CM | POA: Diagnosis present

## 2017-10-29 DIAGNOSIS — J209 Acute bronchitis, unspecified: Secondary | ICD-10-CM | POA: Diagnosis present

## 2017-10-29 DIAGNOSIS — I509 Heart failure, unspecified: Secondary | ICD-10-CM

## 2017-10-29 DIAGNOSIS — K761 Chronic passive congestion of liver: Secondary | ICD-10-CM | POA: Diagnosis present

## 2017-10-29 DIAGNOSIS — F17210 Nicotine dependence, cigarettes, uncomplicated: Secondary | ICD-10-CM | POA: Diagnosis present

## 2017-10-29 DIAGNOSIS — R0600 Dyspnea, unspecified: Secondary | ICD-10-CM

## 2017-10-29 DIAGNOSIS — R079 Chest pain, unspecified: Secondary | ICD-10-CM | POA: Diagnosis present

## 2017-10-29 DIAGNOSIS — J40 Bronchitis, not specified as acute or chronic: Secondary | ICD-10-CM

## 2017-10-29 DIAGNOSIS — I5023 Acute on chronic systolic (congestive) heart failure: Secondary | ICD-10-CM | POA: Diagnosis present

## 2017-10-29 DIAGNOSIS — Z79899 Other long term (current) drug therapy: Secondary | ICD-10-CM

## 2017-10-29 DIAGNOSIS — N839 Noninflammatory disorder of ovary, fallopian tube and broad ligament, unspecified: Secondary | ICD-10-CM | POA: Diagnosis present

## 2017-10-29 DIAGNOSIS — Z7982 Long term (current) use of aspirin: Secondary | ICD-10-CM

## 2017-10-29 DIAGNOSIS — I110 Hypertensive heart disease with heart failure: Secondary | ICD-10-CM | POA: Diagnosis present

## 2017-10-29 DIAGNOSIS — J440 Chronic obstructive pulmonary disease with acute lower respiratory infection: Secondary | ICD-10-CM | POA: Diagnosis present

## 2017-10-29 DIAGNOSIS — R188 Other ascites: Secondary | ICD-10-CM

## 2017-10-29 DIAGNOSIS — Z888 Allergy status to other drugs, medicaments and biological substances status: Secondary | ICD-10-CM

## 2017-10-29 DIAGNOSIS — R0902 Hypoxemia: Secondary | ICD-10-CM | POA: Diagnosis present

## 2017-10-29 DIAGNOSIS — Z23 Encounter for immunization: Secondary | ICD-10-CM

## 2017-10-29 DIAGNOSIS — Z880 Allergy status to penicillin: Secondary | ICD-10-CM

## 2017-10-29 LAB — TROPONIN I: Troponin I: 0.04 ng/mL (ref <0.03)

## 2017-10-29 LAB — COMPREHENSIVE METABOLIC PANEL
ALK PHOS: 61 U/L (ref 38–126)
ALT: 9 U/L — AB (ref 14–54)
ANION GAP: 8 (ref 5–15)
AST: 20 U/L (ref 15–41)
Albumin: 4.3 g/dL (ref 3.5–5.0)
BILIRUBIN TOTAL: 0.8 mg/dL (ref 0.3–1.2)
BUN: 11 mg/dL (ref 6–20)
CALCIUM: 9 mg/dL (ref 8.9–10.3)
CO2: 26 mmol/L (ref 22–32)
CREATININE: 0.91 mg/dL (ref 0.44–1.00)
Chloride: 104 mmol/L (ref 101–111)
GFR calc Af Amer: >60 mL/min (ref >60)
GFR calc non Af Amer: >60 mL/min (ref >60)
GLUCOSE: 111 mg/dL — AB (ref 65–99)
Potassium: 4.5 mmol/L (ref 3.5–5.1)
Sodium: 138 mmol/L (ref 135–145)
TOTAL PROTEIN: 8 g/dL (ref 6.5–8.1)

## 2017-10-29 LAB — CBC WITH DIFFERENTIAL/PLATELET
BASOS ABS: 0 10*3/uL (ref 0–0.1)
Basophils Relative: 1 %
EOS ABS: 0.3 10*3/uL (ref 0–0.7)
EOS PCT: 3 %
HEMATOCRIT: 43.1 % (ref 35.0–47.0)
Hemoglobin: 14 g/dL (ref 12.0–16.0)
Lymphocytes Relative: 5 %
Lymphs Abs: 0.5 10*3/uL — ABNORMAL LOW (ref 1.0–3.6)
MCH: 27.5 pg (ref 26.0–34.0)
MCHC: 32.6 g/dL (ref 32.0–36.0)
MCV: 84.5 fL (ref 80.0–100.0)
MONO ABS: 0.6 10*3/uL (ref 0.2–0.9)
MONOS PCT: 6 %
NEUTROS ABS: 8.1 10*3/uL — AB (ref 1.4–6.5)
Neutrophils Relative %: 85 %
PLATELETS: 121 10*3/uL — AB (ref 150–440)
RBC: 5.1 MIL/uL (ref 3.80–5.20)
RDW: 15.9 % — AB (ref 11.5–14.5)
WBC: 9.5 10*3/uL (ref 3.6–11.0)

## 2017-10-29 LAB — INFLUENZA PANEL BY PCR (TYPE A & B)
Influenza A By PCR: NEGATIVE
Influenza B By PCR: NEGATIVE

## 2017-10-29 LAB — POCT PREGNANCY, URINE: Preg Test, Ur: NEGATIVE

## 2017-10-29 LAB — BRAIN NATRIURETIC PEPTIDE: B Natriuretic Peptide: 457 pg/mL — ABNORMAL HIGH (ref 0.0–100.0)

## 2017-10-29 LAB — DIGOXIN LEVEL: DIGOXIN LVL: 0.7 ng/mL — AB (ref 0.8–2.0)

## 2017-10-29 MED ORDER — METHYLPREDNISOLONE SODIUM SUCC 125 MG IJ SOLR
125.0000 mg | Freq: Once | INTRAMUSCULAR | Status: AC
  Administered 2017-10-29: 125 mg via INTRAVENOUS
  Filled 2017-10-29: qty 2

## 2017-10-29 MED ORDER — DEXTROSE 5 % IV SOLN
500.0000 mg | Freq: Once | INTRAVENOUS | Status: AC
  Administered 2017-10-30: 500 mg via INTRAVENOUS
  Filled 2017-10-29: qty 500

## 2017-10-29 MED ORDER — FUROSEMIDE 10 MG/ML IJ SOLN
40.0000 mg | Freq: Once | INTRAMUSCULAR | Status: AC
  Administered 2017-10-29: 40 mg via INTRAVENOUS
  Filled 2017-10-29: qty 4

## 2017-10-29 MED ORDER — ACETAMINOPHEN 325 MG PO TABS
ORAL_TABLET | ORAL | Status: AC
  Filled 2017-10-29: qty 2

## 2017-10-29 MED ORDER — ALBUTEROL SULFATE (2.5 MG/3ML) 0.083% IN NEBU
5.0000 mg/h | INHALATION_SOLUTION | Freq: Once | RESPIRATORY_TRACT | Status: AC
  Administered 2017-10-29: 5 mg/h via RESPIRATORY_TRACT
  Filled 2017-10-29: qty 6

## 2017-10-29 MED ORDER — IPRATROPIUM-ALBUTEROL 0.5-2.5 (3) MG/3ML IN SOLN
6.0000 mL | Freq: Once | RESPIRATORY_TRACT | Status: AC
  Administered 2017-10-29: 6 mL via RESPIRATORY_TRACT

## 2017-10-29 MED ORDER — ACETAMINOPHEN 325 MG PO TABS
650.0000 mg | ORAL_TABLET | Freq: Once | ORAL | Status: AC
  Administered 2017-10-29: 650 mg via ORAL

## 2017-10-29 MED ORDER — IPRATROPIUM-ALBUTEROL 0.5-2.5 (3) MG/3ML IN SOLN
RESPIRATORY_TRACT | Status: AC
  Filled 2017-10-29: qty 6

## 2017-10-29 MED ORDER — CEFTRIAXONE SODIUM IN DEXTROSE 20 MG/ML IV SOLN
1.0000 g | Freq: Once | INTRAVENOUS | Status: AC
  Administered 2017-10-30: 1 g via INTRAVENOUS
  Filled 2017-10-29: qty 50

## 2017-10-29 NOTE — ED Triage Notes (Signed)
Patient c/o central chest pain and SOB. Patient's respirations labored, wheezes heard throughout.

## 2017-10-29 NOTE — ED Notes (Signed)
Pt EKG delayed d/t EDP exam and XR

## 2017-10-29 NOTE — ED Provider Notes (Addendum)
[facility] Emergency Department Provider Note  ____________________________________________   I have reviewed the triage vital signs and the nursing notes. Where available I have reviewed prior notes and, if possible and indicated, outside hospital notes.    HISTORY  Chief Complaint Chest Pain and Shortness of Breath    HPI Heather Choi is a 57 y.o. female who presents today complaining of fever, wheeze, feeling generally unwell for the last 2 days.  She denies chest pain.  She states she has had some shortness of breath.  Patient does have a history of CHF and is on Lasix has not missed any doses.  Her last EF at this facility was performed on 11/12/2016,, EF of 20-25% at that time.  She denies any abdominal pain, she states that she has had no dysuria no urinary frequency.  She has a history of COPD but she is not on oxygen at baseline.  Also has a history of SVT.    Past Medical History:  Diagnosis Date  . Arthritis   . Bell's palsy   . Enlarged heart   . Hypertension   . Thyroid disease     Patient Active Problem List   Diagnosis Date Noted  . Chest pain 11/11/2016    Past Surgical History:  Procedure Laterality Date  . ABDOMINAL HYSTERECTOMY    . CHOLECYSTECTOMY    . IMPLANTABLE CARDIOVERTER DEFIBRILLATOR IMPLANT      Prior to Admission medications   Medication Sig Start Date End Date Taking? Authorizing Provider  ALPRAZolam Prudy Feeler) 0.5 MG tablet Take 0.5 mg by mouth at bedtime as needed for anxiety (only takes as needed.).    [provider]  aspirin EC 81 MG tablet Take 1 tablet by mouth daily.    [provider]  carvedilol (COREG) 25 MG tablet Take 1 tablet (25 mg total) by mouth daily. Consider holding if SBP < 100 or HR < 60 or feeling dizzi 11/12/16   Delfino Lovett, MD  Digoxin 62.5 MCG TABS Take 0.0625 mg by mouth daily. 07/29/17 07/29/18  Nita Sickle, MD  furosemide (LASIX) 40 MG tablet Take 20 mg by mouth  daily.  12/09/14   [provider]  HYDROcodone-homatropine (HYCODAN) 5-1.5 MG/5ML syrup Take 5 mLs by mouth every 6 (six) hours as needed for cough. Patient not taking: Reported on 11/11/2016 10/26/16   Loleta Rose, MD  ketorolac (ACULAR) 0.5 % ophthalmic solution Place 1 drop into the left eye 4 (four) times daily. Patient not taking: Reported on 11/11/2016 12/30/15   Menshew, Charlesetta Ivory, PA-C  levothyroxine (SYNTHROID, LEVOTHROID) 150 MCG tablet Take 1 tablet by mouth daily. Take 1 tablet every morning with water on an empty stomach 30-60 minutes before breakfast. 03/05/14   [provider]  potassium chloride SA (K-DUR,KLOR-CON) 20 MEQ tablet Take 1 tablet by mouth 2 (two) times daily. 04/29/15   [provider]  spironolactone (ALDACTONE) 25 MG tablet Take 25 mg by mouth daily.  04/29/15   [provider]    Allergies Amlodipine; Penicillins; and Hydrochlorothiazide  No family history on file.  Social History Social History   Tobacco Use  . Smoking status: Current Every Day Smoker    Types: Cigarettes  . Smokeless tobacco: Never Used  Substance Use Topics  . Alcohol use: No    Comment: rarely  . Drug use: No    Review of Systems Constitutional: No fever/chills Eyes: No visual changes. ENT: No sore throat. No stiff neck no neck  pain Cardiovascular: Denies chest pain. Respiratory: + shortness of breath. Gastrointestinal:   no vomiting.  No diarrhea.  No constipation. Genitourinary: Negative for dysuria. Musculoskeletal: Negative lower extremity swelling Skin: Negative for rash. Neurological: Negative for severe headaches, focal weakness or numbness.   ____________________________________________   PHYSICAL EXAM:  VITAL SIGNS: ED Triage Vitals  Enc Vitals Group     BP 10/29/17 2056 (!) 147/85     Pulse Rate 10/29/17 2056 95     Resp 10/29/17 2056 (!) 24     Temp 10/29/17 2056 (!) 100.6 F (38.1 C)     Temp Source 10/29/17 2056  Oral     SpO2 10/29/17 2056 90 %     Weight 10/29/17 2057 150 lb (68 kg)     Height --      Head Circumference --      Peak Flow --      Pain Score 10/29/17 2056 10     Pain Loc --      Pain Edu? --      Excl. in GC? --     Constitutional: Alert and oriented.  With cough and wheeze on nebulizer Eyes: Conjunctivae are normal Head: Atraumatic HEENT: No congestion/rhinnorhea. Mucous membranes are moist.  Oropharynx non-erythematous Neck:   Nontender with no meningismus, no masses, no stridor Cardiovascular: Normal rate, regular rhythm. Grossly normal heart sounds.  Good peripheral circulation. Respiratory: Patient with increased respiratory effort, diffuse wheeze Abdominal: Soft and nontender. No distention. No guarding no rebound Back:  There is no focal tenderness or step off.  there is no midline tenderness there are no lesions noted. there is no CVA tenderness Musculoskeletal: No lower extremity tenderness, no upper extremity tenderness. No joint effusions, no DVT signs strong distal pulses no edema Neurologic:  Normal speech and language. No gross focal neurologic deficits are appreciated.  Skin:  Skin is warm, dry and intact. No rash noted. Psychiatric: Mood and affect are normal. Speech and behavior are normal.  ____________________________________________   LABS (all labs ordered are listed, but only abnormal results are displayed)  Labs Reviewed  TROPONIN I - Abnormal; Notable for the following components:      Result Value   Troponin I 0.04 (*)    All other components within normal limits  COMPREHENSIVE METABOLIC PANEL - Abnormal; Notable for the following components:   Glucose, Bld 111 (*)    ALT 9 (*)    All other components within normal limits  CBC WITH DIFFERENTIAL/PLATELET - Abnormal; Notable for the following components:   RDW 15.9 (*)    Platelets 121 (*)    Neutro Abs 8.1 (*)    Lymphs Abs 0.5 (*)    All other components within normal limits  BRAIN  NATRIURETIC PEPTIDE - Abnormal; Notable for the following components:   B Natriuretic Peptide 457.0 (*)    All other components within normal limits  INFLUENZA PANEL BY PCR (TYPE A & B)  TROPONIN I  POC URINE PREG, ED    Pertinent labs  results that were available during my care of the patient were reviewed by me and considered in my medical decision making (see chart for details). ____________________________________________  EKG  I personally interpreted any EKGs ordered by me or triage Normal sinus rhythm at 92 bpm no acute ST elevation or depression no specific ST changes noted. ____________________________________________  RADIOLOGY  Pertinent labs & imaging results that were available during my care of the patient were reviewed by me and considered in  my medical decision making (see chart for details). If possible, patient and/or family made aware of any abnormal findings.  Dg Chest Port 1 View  Result Date: 10/29/2017 CLINICAL DATA:  Acute chest pain and shortness of breath. EXAM: PORTABLE CHEST 1 VIEW COMPARISON:  07/26/2017 and prior chest radiographs FINDINGS: Cardiomegaly and left-sided pacemaker/AICD again noted. There is no evidence of focal airspace disease, pulmonary edema, suspicious pulmonary nodule/mass, pleural effusion, or pneumothorax. No acute bony abnormalities are identified. IMPRESSION: Cardiomegaly without evidence of acute cardiopulmonary disease. Electronically Signed   By: Harmon PierJeffrey  Hu M.D.   On: 10/29/2017 21:18   ____________________________________________    PROCEDURES  Procedure(s) performed: None  Procedures  Critical Care performed: CRITICAL CARE Performed by: Jeanmarie PlantJAMES A Rmani Kapusta   Total critical care time: 42 minutes  Critical care time was exclusive of separately billable procedures and treating other patients.  Critical care was necessary to treat or prevent imminent or life-threatening deterioration.  Critical care was time spent  personally by me on the following activities: development of treatment plan with patient and/or surrogate as well as nursing, discussions with consultants, evaluation of patient's response to treatment, examination of patient, obtaining history from patient or surrogate, ordering and performing treatments and interventions, ordering and review of laboratory studies, ordering and review of radiographic studies, pulse oximetry and re-evaluation of patient's condition.   ____________________________________________   INITIAL IMPRESSION / ASSESSMENT AND PLAN / ED COURSE  Pertinent labs & imaging results that were available during my care of the patient were reviewed by me and considered in my medical decision making (see chart for details).  Patient with fever, cough and wheeze, influenza is negative, does have history of COPD and CHF, there is elevated BNP but this is not above prior baseline and chest x-ray does not show any infiltrate or evidence of decompensated CHF.  She does have cardiomegaly, chronic low platelets.  Her troponin is borderline however it is at her baseline we will recheck it.  Clearly with fever cough and wheeze this is more URI/COPD issue than anything else I think nonetheless I will give her an extra dose of Lasix, we will give her prednisone, albuterol.  After initial albuterol, patient's wheezes improved but she still has sats in the low 90s still with mild wheeze.     ----------------------------------------- 11:29 PM on 10/29/2017 -----------------------------------------  After second breathing treatment, patient with persistent mild wheeze, she is looking more comfortable she is starting to diuresis, sats are still in the low 90s and lower when she gets up and walks, she is still dyspneic, given EF of 2025% we will admit    ____________________________________________   FINAL CLINICAL IMPRESSION(S) / ED DIAGNOSES  Final diagnoses:  Chest pain      This chart  was dictated using voice recognition software.  Despite best efforts to proofread,  errors can occur which can change meaning.      Jeanmarie PlantMcShane, Jamiyah Dingley A, MD 10/29/17 2256    Jeanmarie PlantMcShane, Nahlia Hellmann A, MD 10/29/17 2329    Jeanmarie PlantMcShane, Marlana Mckowen A, MD 10/29/17 2333

## 2017-10-30 ENCOUNTER — Other Ambulatory Visit: Payer: Self-pay

## 2017-10-30 ENCOUNTER — Encounter: Payer: Self-pay | Admitting: Internal Medicine

## 2017-10-30 ENCOUNTER — Observation Stay
Admit: 2017-10-30 | Discharge: 2017-10-30 | Disposition: A | Discharge status: Home / Self Care | Payer: BLUE CROSS/BLUE SHIELD | Attending: Internal Medicine | Admitting: Internal Medicine

## 2017-10-30 LAB — HEMOGLOBIN A1C
Hgb A1c MFr Bld: 6 % — ABNORMAL HIGH (ref 4.8–5.6)
Mean Plasma Glucose: 125.5 mg/dL

## 2017-10-30 LAB — TSH: TSH: 1.026 u[IU]/mL (ref 0.350–4.500)

## 2017-10-30 LAB — TROPONIN I: Troponin I: 0.04 ng/mL (ref <0.03)

## 2017-10-30 MED ORDER — PNEUMOCOCCAL VAC POLYVALENT 25 MCG/0.5ML IJ INJ
0.5000 mL | INJECTION | INTRAMUSCULAR | Status: AC
  Administered 2017-10-31: 0.5 mL via INTRAMUSCULAR
  Filled 2017-10-30: qty 0.5

## 2017-10-30 MED ORDER — CARVEDILOL 25 MG PO TABS
25.0000 mg | ORAL_TABLET | Freq: Two times a day (BID) | ORAL | Status: DC
  Administered 2017-10-30 – 2017-10-31 (×3): 25 mg via ORAL
  Filled 2017-10-30 (×3): qty 1

## 2017-10-30 MED ORDER — ACETAMINOPHEN 325 MG PO TABS: 650.0000 mg | ORAL_TABLET | Freq: Four times a day (QID) | ORAL | Status: DC | PRN

## 2017-10-30 MED ORDER — DIGOXIN 0.0625 MG HALF TABLET
0.0625 mg | ORAL_TABLET | Freq: Every day | ORAL | Status: DC
  Administered 2017-10-30 – 2017-10-31 (×2): 0.0625 mg via ORAL
  Filled 2017-10-30 (×3): qty 1

## 2017-10-30 MED ORDER — ONDANSETRON HCL 4 MG PO TABS: 4.0000 mg | ORAL_TABLET | Freq: Four times a day (QID) | ORAL | Status: DC | PRN

## 2017-10-30 MED ORDER — ALPRAZOLAM 0.5 MG PO TABS
0.5000 mg | ORAL_TABLET | Freq: Every evening | ORAL | Status: DC | PRN
  Administered 2017-10-30 – 2017-10-31 (×2): 0.5 mg via ORAL
  Filled 2017-10-30 (×2): qty 1

## 2017-10-30 MED ORDER — ACETAMINOPHEN 650 MG RE SUPP: 650.0000 mg | Freq: Four times a day (QID) | RECTAL | Status: DC | PRN

## 2017-10-30 MED ORDER — ENOXAPARIN SODIUM 40 MG/0.4ML ~~LOC~~ SOLN
40.0000 mg | SUBCUTANEOUS | Status: DC
  Administered 2017-10-30: 40 mg via SUBCUTANEOUS
  Filled 2017-10-30: qty 0.4

## 2017-10-30 MED ORDER — TIOTROPIUM BROMIDE MONOHYDRATE 18 MCG IN CAPS
18.0000 ug | ORAL_CAPSULE | Freq: Every day | RESPIRATORY_TRACT | Status: DC
  Administered 2017-10-30 – 2017-10-31 (×2): 18 ug via RESPIRATORY_TRACT
  Filled 2017-10-30 (×2): qty 5

## 2017-10-30 MED ORDER — SPIRONOLACTONE 25 MG PO TABS
25.0000 mg | ORAL_TABLET | Freq: Every day | ORAL | Status: DC
  Administered 2017-10-30 – 2017-10-31 (×2): 25 mg via ORAL
  Filled 2017-10-30 (×3): qty 1

## 2017-10-30 MED ORDER — ONDANSETRON HCL 4 MG/2ML IJ SOLN: 4.0000 mg | Freq: Four times a day (QID) | INTRAMUSCULAR | Status: DC | PRN

## 2017-10-30 MED ORDER — ASPIRIN EC 81 MG PO TBEC
81.0000 mg | DELAYED_RELEASE_TABLET | Freq: Every day | ORAL | Status: DC
  Administered 2017-10-30 – 2017-10-31 (×2): 81 mg via ORAL
  Filled 2017-10-30 (×2): qty 1

## 2017-10-30 MED ORDER — LEVOTHYROXINE SODIUM 50 MCG PO TABS
150.0000 ug | ORAL_TABLET | Freq: Every day | ORAL | Status: DC
  Administered 2017-10-30 – 2017-10-31 (×2): 150 ug via ORAL
  Filled 2017-10-30: qty 1
  Filled 2017-10-30: qty 3

## 2017-10-30 MED ORDER — POTASSIUM CHLORIDE CRYS ER 20 MEQ PO TBCR
20.0000 meq | EXTENDED_RELEASE_TABLET | Freq: Two times a day (BID) | ORAL | Status: DC
  Administered 2017-10-30 – 2017-10-31 (×3): 20 meq via ORAL
  Filled 2017-10-30 (×3): qty 1

## 2017-10-30 MED ORDER — DOCUSATE SODIUM 100 MG PO CAPS
100.0000 mg | ORAL_CAPSULE | Freq: Two times a day (BID) | ORAL | Status: DC
  Administered 2017-10-30 – 2017-10-31 (×3): 100 mg via ORAL
  Filled 2017-10-30 (×4): qty 1

## 2017-10-30 MED ORDER — FUROSEMIDE 10 MG/ML IJ SOLN: 20.0000 mg | Freq: Once | INTRAMUSCULAR | Status: DC

## 2017-10-30 MED ORDER — ALBUTEROL SULFATE (2.5 MG/3ML) 0.083% IN NEBU
2.5000 mg | INHALATION_SOLUTION | RESPIRATORY_TRACT | Status: DC
  Administered 2017-10-30 – 2017-10-31 (×8): 2.5 mg via RESPIRATORY_TRACT
  Filled 2017-10-30 (×9): qty 3

## 2017-10-30 MED ORDER — PREDNISONE 50 MG PO TABS
50.0000 mg | ORAL_TABLET | Freq: Every day | ORAL | Status: DC
  Administered 2017-10-30 – 2017-10-31 (×2): 50 mg via ORAL
  Filled 2017-10-30: qty 1
  Filled 2017-10-30: qty 2

## 2017-10-30 MED ORDER — FUROSEMIDE 20 MG PO TABS
20.0000 mg | ORAL_TABLET | Freq: Every day | ORAL | Status: DC
  Administered 2017-10-30: 20 mg via ORAL
  Filled 2017-10-30: qty 1

## 2017-10-30 NOTE — ED Notes (Signed)
Patient is resting comfortably at this time with no signs of distress present. VS stable. Will continue to monitor.   

## 2017-10-30 NOTE — ED Notes (Signed)
Pt given breakfast tray

## 2017-10-30 NOTE — ED Notes (Addendum)
Pt given sandwich tray and gingerale. Family remains at bedside

## 2017-10-30 NOTE — ED Notes (Signed)
Awaiting patient 3rd breakfast tray from dietary.

## 2017-10-30 NOTE — ED Notes (Signed)
Pt ate boiled eggs for breakfast. Resting at this time.

## 2017-10-30 NOTE — ED Notes (Signed)
Dietary contacted, states that they are sending new tray shortly.

## 2017-10-30 NOTE — Progress Notes (Addendum)
The patient is admitted for COPD exacerbation with abdominal distention and possible ascites this morning. Vital signs is reviewed.  Physical examination show bilateral expiratory wheezing.  Abdominal distention with positive ascites sign.  Continue current treatment.  I ordered paracentesis. Tobacco abuse.  Smoking cessation was counseled for 3-4 minutes. I discussed with the patient, her daughter and RN.  Time spent about 35 minutes.

## 2017-10-30 NOTE — ED Notes (Signed)
Admitting MD at bedside.

## 2017-10-30 NOTE — Progress Notes (Signed)
*  PRELIMINARY RESULTS* Echocardiogram 2D Echocardiogram has been performed.  Heather BlueHege, Heather Choi 10/30/2017, 3:20 PM

## 2017-10-30 NOTE — H&P (Signed)
Heather Choi is an 57 y.o. female.   Chief Complaint: Shortness of breath HPI: The patient with past medical history of CHF, hypertension and hypothyroidism presents to the emergency department complaining of shortness of breath.  The patient states that she has become progressively more dyspneic over the last 2 days.  Today she has had shortness of breath as well as some chest pain.  She can also hear herself wheezing at times and has had a productive cough of thick white phlegm.  In the emergency department patient received multiple breathing treatments as well as Solu-Medrol.  Her chest pain improved as her breathing improved.  However, due to her comorbidities the emergency department staff called the hospitalist service for further evaluation.  Past Medical History:  Diagnosis Date  . Arthritis   . Bell's palsy   . Enlarged heart   . Hypertension   . Thyroid disease     Past Surgical History:  Procedure Laterality Date  . ABDOMINAL HYSTERECTOMY    . CHOLECYSTECTOMY    . IMPLANTABLE CARDIOVERTER DEFIBRILLATOR IMPLANT      Family History  Problem Relation Age of Onset  . Heart failure Father    Social History:  reports that she has been smoking cigarettes.  she has never used smokeless tobacco. She reports that she does not drink alcohol or use drugs.  Allergies:  Allergies  Allergen Reactions  . Amlodipine Other (See Comments) and Itching  . Penicillins Other (See Comments)    Has patient had a PCN reaction causing immediate rash, facial/tongue/throat swelling, SOB or lightheadedness with hypotension: Yes Has patient had a PCN reaction causing severe rash involving mucus membranes or skin necrosis: No Has patient had a PCN reaction that required hospitalization: No Has patient had a PCN reaction occurring within the last 10 years: Yes If all of the above answers are "NO", then may proceed with Cephalosporin use.  Marland Kitchen Hydrochlorothiazide Rash    Prior to Admission  medications   Medication Sig Start Date End Date Taking? Authorizing Provider  ALPRAZolam Duanne Moron) 0.5 MG tablet Take 0.5 mg by mouth at bedtime as needed for anxiety (only takes as needed.).   Yes [provider]  aspirin EC 81 MG tablet Take 1 tablet by mouth daily.   Yes [provider]  carvedilol (COREG) 25 MG tablet Take 1 tablet (25 mg total) by mouth daily. Consider holding if SBP < 100 or HR < 60 or feeling dizzi Patient taking differently: Take 25 mg by mouth 2 (two) times daily with a meal. Consider holding if SBP < 100 or HR < 60 or feeling dizzi 11/12/16  Yes Max Sane, MD  Digoxin 62.5 MCG TABS Take 0.0625 mg by mouth daily. 07/29/17 07/29/18 Yes Veronese, [place], MD  furosemide (LASIX) 40 MG tablet Take 40 mg by mouth daily.  12/09/14  Yes [provider]  levothyroxine (SYNTHROID, LEVOTHROID) 150 MCG tablet Take 1 tablet by mouth daily. Take 1 tablet every morning with water on an empty stomach 30-60 minutes before breakfast. 03/05/14  Yes [provider]  potassium chloride SA (K-DUR,KLOR-CON) 20 MEQ tablet Take 1 tablet by mouth 2 (two) times daily. 04/29/15  Yes [provider]  spironolactone (ALDACTONE) 25 MG tablet Take 25 mg by mouth daily.  04/29/15  Yes [provider]  HYDROcodone-homatropine (HYCODAN) 5-1.5 MG/5ML syrup Take 5 mLs by mouth every 6 (six) hours as needed for cough. Patient not taking: Reported on 11/11/2016 10/26/16   Hinda Kehr, MD  ketorolac (ACULAR) 0.5 % ophthalmic solution Place 1 drop into the left eye 4 (four) times daily. Patient not taking: Reported on 11/11/2016 12/30/15   Menshew, Dannielle Karvonen, PA-C     Results for orders placed or performed during the hospital encounter of 10/29/17 (from the past 48 hour(s))  Troponin I     Status: Abnormal   Collection Time: 10/29/17  9:00 PM  Result Value Ref Range   Troponin I 0.04 (HH) <0.03 ng/mL    Comment: CRITICAL RESULT CALLED TO, READ BACK BY AND  VERIFIED WITH ALLISON PATE ON 10/29/17 AT 2218 JAG Performed at [facility], [address] [place], [place] [address]   Comprehensive metabolic panel     Status: Abnormal   Collection Time: 10/29/17  9:00 PM  Result Value Ref Range   Sodium 138 135 - 145 mmol/L   Potassium 4.5 3.5 - 5.1 mmol/L   Chloride 104 101 - 111 mmol/L   CO2 26 22 - 32 mmol/L   Glucose, Bld 111 (H) 65 - 99 mg/dL   BUN 11 6 - 20 mg/dL   Creatinine, Ser 0.91 0.44 - 1.00 mg/dL   Calcium 9.0 8.9 - 10.3 mg/dL   Total Protein 8.0 6.5 - 8.1 g/dL   Albumin 4.3 3.5 - 5.0 g/dL   AST 20 15 - 41 U/L   ALT 9 (L) 14 - 54 U/L   Alkaline Phosphatase 61 38 - 126 U/L   Total Bilirubin 0.8 0.3 - 1.2 mg/dL   GFR calc non Af Amer >60 >60 mL/min   GFR calc Af Amer >60 >60 mL/min    Comment: (NOTE) The eGFR has been calculated using the CKD EPI equation. This calculation has not been validated in all clinical situations. eGFR's persistently <60 mL/min signify possible Chronic Kidney Disease.    Anion gap 8 5 - 15    Comment: Performed at [facility], [address] [place], [place] [address]  CBC with Differential     Status: Abnormal   Collection Time: 10/29/17  9:00 PM  Result Value Ref Range   WBC 9.5 3.6 - 11.0 K/uL   RBC 5.10 3.80 - 5.20 MIL/uL   Hemoglobin 14.0 12.0 - 16.0 g/dL   HCT 43.1 35.0 - 47.0 %   MCV 84.5 80.0 - 100.0 fL   MCH 27.5 26.0 - 34.0 pg   MCHC 32.6 32.0 - 36.0 g/dL   RDW 15.9 (H) 11.5 - 14.5 %   Platelets 121 (L) 150 - 440 K/uL   Neutrophils Relative % 85 %   Neutro Abs 8.1 (H) 1.4 - 6.5 K/uL   Lymphocytes Relative 5 %   Lymphs Abs 0.5 (L) 1.0 - 3.6 K/uL   Monocytes Relative 6 %   Monocytes Absolute 0.6 0.2 - 0.9 K/uL   Eosinophils Relative 3 %   Eosinophils Absolute 0.3 0 - 0.7 K/uL   Basophils Relative 1 %   Basophils Absolute 0.0 0 - 0.1 K/uL    Comment: Performed at [facility], [address] [place], [place] [address]  Brain natriuretic peptide      Status: Abnormal   Collection Time: 10/29/17  9:00 PM  Result Value Ref Range   B Natriuretic Peptide 457.0 (H) 0.0 - 100.0 pg/mL    Comment: Performed at [facility], [address] [place], [place] [address]  Influenza panel by PCR (type A & B)     Status: None   Collection Time: 10/29/17  9:00 PM  Result Value Ref Range   Influenza A By PCR NEGATIVE NEGATIVE   Influenza B By PCR NEGATIVE NEGATIVE    Comment: (NOTE) The Xpert Xpress Flu assay is intended as an aid in the diagnosis of  influenza and should not be used as a sole basis for treatment.  This  assay is FDA approved for nasopharyngeal swab specimens only. Nasal  washings and aspirates are unacceptable for Xpert Xpress Flu testing. Performed at [facility], [address] [place], [place] [address]   Digoxin level     Status: Abnormal   Collection Time: 10/29/17  9:15 PM  Result Value Ref Range   Digoxin Level 0.7 (L) 0.8 - 2.0 ng/mL    Comment: Performed at [facility], [address] [place], [place] [address]  Pregnancy, urine POC     Status: None   Collection Time: 10/29/17 11:43 PM  Result Value Ref Range   Preg Test, Ur NEGATIVE NEGATIVE    Comment:        THE SENSITIVITY OF THIS METHODOLOGY IS >24 mIU/mL   Troponin I     Status: Abnormal   Collection Time: 10/30/17  2:48 AM  Result Value Ref Range   Troponin I 0.04 (HH) <0.03 ng/mL    Comment: CRITICAL VALUE NOTED. VALUE IS CONSISTENT WITH PREVIOUSLY REPORTED/CALLED VALUE.JAG Performed at [facility], [address] [place], [place] [address]   TSH     Status: None   Collection Time: 10/30/17  2:48 AM  Result Value Ref Range   TSH 1.026 0.350 - 4.500 uIU/mL    Comment: Performed by a 3rd Generation assay with a functional sensitivity of <=0.01 uIU/mL. Performed at [facility], [address] [place], [place] [address]    Dg Chest Port 1 View  Result Date: 10/29/2017 CLINICAL DATA:  Acute  chest pain and shortness of breath. EXAM: PORTABLE CHEST 1 VIEW COMPARISON:  07/26/2017 and prior chest radiographs FINDINGS: Cardiomegaly and left-sided pacemaker/AICD again noted. There is no evidence of focal airspace disease, pulmonary edema, suspicious pulmonary nodule/mass, pleural effusion, or pneumothorax. No acute bony abnormalities are identified. IMPRESSION: Cardiomegaly without evidence of acute cardiopulmonary disease. Electronically Signed   By: Margarette Canada M.D.   On: 10/29/2017 21:18    Review of Systems  Constitutional: Negative for chills and fever.  HENT: Negative for sore throat and tinnitus.   Eyes: Negative for blurred vision and redness.  Respiratory: Positive for shortness of breath. Negative for cough.   Cardiovascular: Positive for chest pain. Negative for palpitations, orthopnea and PND.  Gastrointestinal: Negative for abdominal pain, diarrhea, nausea and vomiting.  Genitourinary: Negative for dysuria, frequency and urgency.  Musculoskeletal: Negative for joint pain and myalgias.  Skin: Negative for rash.       No lesions  Neurological: Negative for speech change, focal weakness and weakness.  Endo/Heme/Allergies: Does not bruise/bleed easily.       No temperature intolerance  Psychiatric/Behavioral: Negative for depression and suicidal ideas.    Blood pressure 119/68, pulse 78, temperature (!) 97.5 F (36.4 C), temperature source Oral, resp. rate (!) 23, weight 68 kg (150 lb), SpO2 100 %. Physical Exam  Vitals reviewed. Constitutional: She is oriented to person, place, and time. She appears well-developed and well-nourished. No distress.  HENT:  Head: Normocephalic and atraumatic.  Mouth/Throat: No oropharyngeal exudate.  Eyes: Conjunctivae and EOM are normal. Pupils are equal, round, and reactive to light. No scleral icterus.  Neck: Normal range  of motion. Neck supple. No JVD present. No tracheal deviation present. No thyromegaly present.  Cardiovascular:  Normal rate, regular rhythm and normal heart sounds. Exam reveals no gallop and no friction rub.  No murmur heard. Respiratory: Effort normal and breath sounds normal.  GI: Soft. Bowel sounds are normal. She exhibits distension and ascites. There is no tenderness.  Genitourinary:  Genitourinary Comments: Deferred  Musculoskeletal: Normal range of motion. She exhibits no edema.  Lymphadenopathy:    She has no cervical adenopathy.  Neurological: She is alert and oriented to person, place, and time. No cranial nerve deficit. She exhibits normal muscle tone.  Skin: Skin is warm and dry. No rash noted. No erythema.  Psychiatric: She has a normal mood and affect. Her behavior is normal. Judgment and thought content normal.     Assessment/Plan This is a 57 year old female admitted for chest pain. 1.  Chest pain: Resolved; differential diagnosis includes demand ischemia secondary to COPD exacerbation.  Continue to follow cardiac biomarkers.  Monitor telemetry.  Consult cardiology.  Continue carvedilol and aspirin 2.  COPD: Active exacerbation; wheezing throughout lung fields.  Continue prednisone and scheduled breathing treatments.  Also continue inhaled corticosteroid.  I have added Spiriva. 3.  CHF: Systolic; chronic.  Last EF 20-25%.  Patient has AICD in place.  BNP is elevated but the patient does not have any pulmonary or pedal edema.  I have ordered a repeat echocardiogram to evaluate for dyssynchrony and possible biventricular pacemaker placement.  Continue digoxin as well as Lasix and spironolactone. 4.  Abdominal ascites: Secondary to liver congestion.  May also contribute to respiratory distress.  Consider therapeutic paracentesis.  The patient has no signs or symptoms of sepsis at this time. 5.  Hypothyroidism: Check TSH; continue Synthroid 6.  DVT prophylaxis: Lovenox 7.  GI prophylaxis: None The patient is a full code.  Time spent on admission orders and patient care approximately 45  minutes  Harrie Foreman, MD 10/30/2017, 5:44 AM

## 2017-10-30 NOTE — ED Notes (Signed)
Pt requesting something different for breakfast. Dietary contacted and states that they will send.

## 2017-10-30 NOTE — ED Notes (Signed)
Pt given ginger ale per request. No further needs at this time. Family at bedside.

## 2017-10-30 NOTE — ED Notes (Addendum)
Pt does not like the eggs that were sent from dietary. Dietary unable to send her request for sausage due to heart healthy diet ordered. Request for new tray.

## 2017-10-30 NOTE — ED Notes (Signed)
Pt called out with c/o that the IV site was itching; [facility] or sensation of oral/throat tightening or swelling. Infusion rate of PIV Azithromycin decreased to 125/mL/hr for comfort. Primary nurse, Dwana CurdAllison RN, made aware.

## 2017-10-30 NOTE — ED Notes (Signed)
Pt repositioned in bed.

## 2017-10-30 NOTE — ED Notes (Signed)
Report to Iris, RN  

## 2017-10-30 NOTE — ED Notes (Addendum)
Pt given lunch tray. Daughter at bedside with patient.

## 2017-10-31 ENCOUNTER — Inpatient Hospital Stay: Payer: BLUE CROSS/BLUE SHIELD

## 2017-10-31 ENCOUNTER — Encounter: Payer: Self-pay | Admitting: *Deleted

## 2017-10-31 DIAGNOSIS — Z880 Allergy status to penicillin: Secondary | ICD-10-CM | POA: Diagnosis not present

## 2017-10-31 DIAGNOSIS — Z7982 Long term (current) use of aspirin: Secondary | ICD-10-CM | POA: Diagnosis not present

## 2017-10-31 DIAGNOSIS — F17210 Nicotine dependence, cigarettes, uncomplicated: Secondary | ICD-10-CM | POA: Diagnosis present

## 2017-10-31 DIAGNOSIS — K761 Chronic passive congestion of liver: Secondary | ICD-10-CM | POA: Diagnosis present

## 2017-10-31 DIAGNOSIS — N839 Noninflammatory disorder of ovary, fallopian tube and broad ligament, unspecified: Secondary | ICD-10-CM | POA: Diagnosis present

## 2017-10-31 DIAGNOSIS — Z95810 Presence of automatic (implantable) cardiac defibrillator: Secondary | ICD-10-CM | POA: Diagnosis not present

## 2017-10-31 DIAGNOSIS — J440 Chronic obstructive pulmonary disease with acute lower respiratory infection: Secondary | ICD-10-CM | POA: Diagnosis present

## 2017-10-31 DIAGNOSIS — Z23 Encounter for immunization: Secondary | ICD-10-CM | POA: Diagnosis not present

## 2017-10-31 DIAGNOSIS — I110 Hypertensive heart disease with heart failure: Secondary | ICD-10-CM | POA: Diagnosis present

## 2017-10-31 DIAGNOSIS — R0902 Hypoxemia: Secondary | ICD-10-CM | POA: Diagnosis present

## 2017-10-31 DIAGNOSIS — I509 Heart failure, unspecified: Secondary | ICD-10-CM

## 2017-10-31 DIAGNOSIS — Z79899 Other long term (current) drug therapy: Secondary | ICD-10-CM | POA: Diagnosis not present

## 2017-10-31 DIAGNOSIS — J209 Acute bronchitis, unspecified: Secondary | ICD-10-CM | POA: Diagnosis present

## 2017-10-31 DIAGNOSIS — E039 Hypothyroidism, unspecified: Secondary | ICD-10-CM | POA: Diagnosis present

## 2017-10-31 DIAGNOSIS — Z888 Allergy status to other drugs, medicaments and biological substances status: Secondary | ICD-10-CM | POA: Diagnosis not present

## 2017-10-31 DIAGNOSIS — I5023 Acute on chronic systolic (congestive) heart failure: Secondary | ICD-10-CM | POA: Diagnosis present

## 2017-10-31 DIAGNOSIS — J441 Chronic obstructive pulmonary disease with (acute) exacerbation: Secondary | ICD-10-CM | POA: Diagnosis present

## 2017-10-31 LAB — ECHOCARDIOGRAM COMPLETE
Height: 65 in
Weight: 2400 oz

## 2017-10-31 MED ORDER — PREDNISONE 50 MG PO TABS: 50.0000 mg | ORAL_TABLET | Freq: Every day | ORAL | 0 refills | Status: DC

## 2017-10-31 MED ORDER — FUROSEMIDE 10 MG/ML IJ SOLN
60.0000 mg | Freq: Two times a day (BID) | INTRAMUSCULAR | Status: DC
  Administered 2017-10-31: 60 mg via INTRAVENOUS
  Filled 2017-10-31: qty 6

## 2017-10-31 MED ORDER — LISINOPRIL 5 MG PO TABS: 5.0000 mg | ORAL_TABLET | Freq: Every day | ORAL | 0 refills | Status: DC

## 2017-10-31 MED ORDER — ALBUTEROL SULFATE HFA 108 (90 BASE) MCG/ACT IN AERS: 2.0000 | INHALATION_SPRAY | Freq: Four times a day (QID) | RESPIRATORY_TRACT | 2 refills | Status: DC | PRN

## 2017-10-31 MED ORDER — IOPAMIDOL (ISOVUE-370) INJECTION 76%
75.0000 mL | Freq: Once | INTRAVENOUS | Status: AC | PRN
  Administered 2017-10-31: 75 mL via INTRAVENOUS

## 2017-10-31 NOTE — Care Management (Signed)
Patient has been admitted from observation due to need for continued stay to manage symptoms of pulmonary edema, IV lasix and further work up and treatment for what appears to be new ascites

## 2017-10-31 NOTE — Consult Note (Signed)
[facility]  Patient ID: Heather Choi, MRN: [id number], DOB/AGE: 56/05/1961 56 y.o. Admit date: 10/29/2017   Date of Consult: 10/31/2017 Primary Physician: Kandyce RudBabaoff, Marcus, MD Primary Cardiologist:  paraschos  Chief Complaint:  Chief Complaint  Patient presents with  . Chest Pain  . Shortness of Breath   Reason for Consult: Heart failure  HPI: 57 y.o. 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  This includes beta-blocker furosemide and spironolactone.  The patient has done fairly well until recently when she had significant COPD exacerbation with severe expiratory wheezes and some basilar crackles.  The patient also has had weight gain with lower extremity edema and mainly ascites.  Her shortness of breath and significant chest pain have significantly elevated a little.  The patient did have an EKG showing sinus cardia and a troponin which was normal.  The patient since then has had intravenous Lasix for which she has had some improvements but not complete improvements.  Inhalers have helped as well.  Chest x-ray shows bibasilar pulmonary edema.  Today the patient has slight improvements and is hemodynamically stable  Past Medical History:  Diagnosis Date  . Arthritis   . Bell's palsy   . Enlarged heart   . Hypertension   . Thyroid disease       Surgical History:  Past Surgical History:  Procedure Laterality Date  . ABDOMINAL HYSTERECTOMY    . CHOLECYSTECTOMY    . IMPLANTABLE CARDIOVERTER DEFIBRILLATOR IMPLANT       Home Meds: Prior to Admission medications   Medication Sig Start Date End Date Taking? Authorizing Provider  ALPRAZolam Prudy Feeler(XANAX) 0.5 MG tablet Take 0.5 mg by mouth at bedtime as needed for anxiety (only takes as needed.).   Yes [provider]  aspirin EC 81 MG tablet Take 1 tablet by mouth daily.   Yes [provider]  carvedilol (COREG) 25 MG tablet Take 1 tablet (25 mg total) by mouth daily. Consider holding if SBP < 100 or HR < 60 or feeling dizzi Patient taking differently: Take 25 mg by mouth 2 (two) times daily with a meal. Consider holding if SBP < 100 or HR < 60 or feeling dizzi 11/12/16  Yes Delfino LovettShah, Vipul, MD  Digoxin 62.5 MCG TABS Take 0.0625 mg by mouth daily. 07/29/17 07/29/18 Yes Veronese, [place], MD  furosemide (LASIX) 40 MG tablet Take 40 mg by mouth daily.  12/09/14  Yes [provider]  levothyroxine (SYNTHROID, LEVOTHROID) 150 MCG tablet Take 1 tablet by mouth daily. Take 1 tablet every morning with water on an empty stomach 30-60 minutes before breakfast. 03/05/14  Yes [provider]  potassium chloride SA (K-DUR,KLOR-CON) 20 MEQ tablet Take 1 tablet by mouth 2 (two) times daily. 04/29/15  Yes [provider]  spironolactone (ALDACTONE) 25 MG tablet Take 25 mg by mouth daily.  04/29/15  Yes [provider]  HYDROcodone-homatropine (HYCODAN) 5-1.5 MG/5ML syrup Take 5 mLs by mouth every 6 (six) hours as needed for cough. Patient not taking: Reported on 11/11/2016 10/26/16   Loleta RoseForbach, Cory, MD  ketorolac (ACULAR) 0.5 % ophthalmic solution Place 1 drop into the left eye 4 (four) times daily. Patient not taking: Reported on 11/11/2016 12/30/15   Menshew, Charlesetta IvoryJenise V Bacon, PA-C    Inpatient Medications:  . albuterol  2.5 mg Nebulization Q4H  . aspirin EC  81 mg Oral Daily  . carvedilol  25 mg Oral BID WC  . digoxin  0.0625 mg Oral Daily  . docusate sodium  100 mg Oral BID  . enoxaparin (LOVENOX) injection  40 mg Subcutaneous Q24H  . furosemide  20 mg Oral Daily  . levothyroxine  150 mcg Oral Daily  . pneumococcal 23 valent vaccine  0.5 mL Intramuscular Tomorrow-1000  . potassium chloride SA  20 mEq Oral BID  . predniSONE  50 mg Oral Q breakfast  . spironolactone  25 mg  Oral Daily  . tiotropium  18 mcg Inhalation Daily     Allergies:  Allergies  Allergen Reactions  . Amlodipine Other (See Comments) and Itching  . Penicillins Other (See Comments)    Has patient had a PCN reaction causing immediate rash, facial/tongue/throat swelling, SOB or lightheadedness with hypotension: Yes Has patient had a PCN reaction causing severe rash involving mucus membranes or skin necrosis: No Has patient had a PCN reaction that required hospitalization: No Has patient had a PCN reaction occurring within the last 10 years: Yes If all of the above answers are "NO", then may proceed with Cephalosporin use.  Marland Kitchen Hydrochlorothiazide Rash    Social History   Socioeconomic History  . Marital status: Single    Spouse name: Not on file  . Number of children: Not on file  . Years of education: Not on file  . Highest education level: Not on file  Social Needs  . Financial resource strain: Not on file  . Food insecurity - worry: Not on file  . Food insecurity - inability: Not on file  . Transportation needs - medical: Not on file  . Transportation needs - non-medical: Not on file  Occupational History  . Not on file  Tobacco Use  . Smoking status: Current Every Day Smoker    Packs/day: 0.50    Types: Cigarettes  . Smokeless tobacco: Never Used  Substance and Sexual Activity  . Alcohol use: No    Comment: rarely  . Drug use: No  . Sexual activity: Not on file  Other Topics Concern  . Not on file  Social History Narrative  . Not on file     Family History  Problem Relation Age of Onset  . Heart failure Father      Review of Systems Positive for abdominal distention and shortness of breath Negative for: General:  chills, fever, night sweats or weight changes.  Cardiovascular: PND orthopnea syncope dizziness  Dermatological skin lesions rashes Respiratory: Cough congestion Urologic: Frequent urination urination at night and hematuria Abdominal: negative for  nausea, vomiting, diarrhea, bright red blood per rectum, melena, or hematemesis Neurologic: negative for visual changes, and/or hearing changes  All other systems reviewed and are otherwise negative except as noted above.  Labs: Recent Labs    10/29/17 2100 10/30/17 0248  TROPONINI 0.04* 0.04*   Lab Results  Component Value Date   WBC 9.5 10/29/2017   HGB 14.0 10/29/2017   HCT 43.1 10/29/2017   MCV 84.5 10/29/2017   PLT 121 (L) 10/29/2017    Recent Labs  Lab 10/29/17 2100  NA 138  K 4.5  CL 104  CO2 26  BUN 11  CREATININE 0.91  CALCIUM 9.0  PROT 8.0  BILITOT 0.8  ALKPHOS 61  ALT 9*  AST 20  GLUCOSE 111*   Lab Results  Component Value Date   CHOL 113 11/12/2016   HDL 40 (L) 11/12/2016   LDLCALC 60 11/12/2016  TRIG 66 11/12/2016   No results found for: DDIMER  Radiology/Studies:  Dg Chest Port 1 View  Result Date: 10/29/2017 CLINICAL DATA:  Acute chest pain and shortness of breath. EXAM: PORTABLE CHEST 1 VIEW COMPARISON:  07/26/2017 and prior chest radiographs FINDINGS: Cardiomegaly and left-sided pacemaker/AICD again noted. There is no evidence of focal airspace disease, pulmonary edema, suspicious pulmonary nodule/mass, pleural effusion, or pneumothorax. No acute bony abnormalities are identified. IMPRESSION: Cardiomegaly without evidence of acute cardiopulmonary disease. Electronically Signed   By: Harmon Pier M.D.   On: 10/29/2017 21:18    EKG: Sinus tachycardia  Weights: Filed Weights   10/29/17 2057 10/31/17 0622  Weight: 150 lb (68 kg) 148 lb 11.2 oz (67.4 kg)     Physical Exam: Blood pressure (!) 162/91, pulse 75, temperature 97.8 F (36.6 C), temperature source Oral, resp. rate 18, height 5\' 5"  (1.651 m), weight 148 lb 11.2 oz (67.4 kg), SpO2 96 %. Body mass index is 24.74 kg/m. General: Well developed, well nourished, in no acute distress. Head eyes ears nose throat: Normocephalic, atraumatic, sclera non-icteric, no xanthomas, nares are  without discharge. No apparent thyromegaly and/or mass  Lungs: Normal respiratory effort.  Basilar and diffuse wheezes, basilar rales, no rhonchi.  Heart: RRR with normal S1 S2. no murmur gallop, no rub, PMI is normal size and placement, carotid upstroke normal without bruit, jugular venous pressure is normal Abdomen: Hard, non-tender,  distended with normoactive bowel sounds. No hepatomegaly. No rebound/guarding. No obvious abdominal masses. Abdominal aorta is normal size without bruit Extremities: Trace to 1+ edema. no cyanosis, no clubbing, no ulcers  Peripheral : 2+ bilateral upper extremity pulses, 2+ bilateral femoral pulses, 2+ bilateral dorsal pedal pulse Neuro: Alert and oriented. No facial asymmetry. No focal deficit. Moves all extremities spontaneously. Musculoskeletal: Normal muscle tone without kyphosis Psych:  Responds to questions appropriately with a normal affect.    Assessment: 57 year old female with acute on chronic systolic dysfunction congestive heart failure with exacerbation of COPD and hypoxia and abdominal ascites without evidence of myocardial infarction  Plan: 1.  Continue intravenous Lasix for pulmonary edema lower extremity edema ascites and acute on chronic systolic dysfunction heart failure 2.  Continue spironolactone for ascites and consider paracentesis if necessary 3.  Continue supportive care of COPD exacerbation which exacerbated above heart failure as well 4.  Echocardiogram for LV systolic dysfunction changes needed to further intervene with change in medications 5.  Further diagnostic testing and treatment options after above  Signed, Lamar Blinks M.D. [facility] Cardiology 10/31/2017, 7:59 AM

## 2017-10-31 NOTE — Progress Notes (Signed)
Pt discharging home. IV & heart monitor removed. Discharge instructions and education reviewed with pt.

## 2017-11-01 ENCOUNTER — Telehealth: Payer: Self-pay

## 2017-11-01 NOTE — Discharge Summary (Signed)
[facility] [place] [facility]   PATIENT NAME: Heather Choi    MR#:  [id number]  DATE OF BIRTH:  12/08/1960  DATE OF ADMISSION:  10/29/2017 ADMITTING PHYSICIAN: Arnaldo NatalMichael S Diamond, MD  DATE OF DISCHARGE: 10/31/2017  3:22 PM  PRIMARY CARE PHYSICIAN: Kandyce RudBabaoff, Marcus, MD   ADMISSION DIAGNOSIS:  Bronchitis [J40] COPD exacerbation (HCC) [J44.1] Chest pain [R07.9] Dyspnea, unspecified type [R06.00] Congestive heart failure, unspecified HF chronicity, unspecified heart failure type (HCC) [I50.9]  DISCHARGE DIAGNOSIS:  Active Problems:   Chest pain   Heart failure (HCC)   SECONDARY DIAGNOSIS:   Past Medical History:  Diagnosis Date  . Arthritis   . Bell's palsy   . Enlarged heart   . Hypertension   . Thyroid disease      ADMITTING HISTORY  Chief Complaint: Shortness of breath HPI: The patient with past medical history of CHF, hypertension and hypothyroidism presents to the emergency department complaining of shortness of breath.  The patient states that she has become progressively more dyspneic over the last 2 days.  Today she has had shortness of breath as well as some chest pain.  She can also hear herself wheezing at times and has had a productive cough of thick white phlegm.  In the emergency department patient received multiple breathing treatments as well as Solu-Medrol.  Her chest pain improved as her breathing improved.  However, due to her comorbidities the emergency department staff called the hospitalist service for further evaluation.    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  Patient improved well with diuresis.  There was concern regarding possible ascites and an ultrasound of the abdomen was obtained.  This did not show any  ascites but raise concern for a cystic pelvic mass.  A CT scan of the abdomen and pelvis showed ovarian cystic mass concerning for malignancy.  Discussed case with gynecology.  Patient referred to GYN oncology Dr. Johnnette LitterBerchuck. Patient has been counseled to quit smoking.  Patient being discharged home in stable condition with Lasix, beta-blocker and lisinopril.  CONSULTS OBTAINED:  Treatment Team:  Lamar BlinksKowalski, Bruce J, MD  DRUG ALLERGIES:   Allergies  Allergen Reactions  . Amlodipine Other (See Comments) and Itching  . Penicillins Other (See Comments)    Has patient had a PCN reaction causing immediate rash, facial/tongue/throat swelling, SOB or lightheadedness with hypotension: Yes Has patient had a PCN reaction causing severe rash involving mucus membranes or skin necrosis: No Has patient had a PCN reaction that required hospitalization: No Has patient had a PCN reaction occurring within the last 10 years: Yes If all of the above answers are "NO", then may proceed with Cephalosporin use.  Marland Kitchen. Hydrochlorothiazide Rash    DISCHARGE MEDICATIONS:   Allergies as of 10/31/2017      Reactions   Amlodipine Other (See Comments), Itching   Penicillins Other (See Comments)   Has patient had a PCN reaction causing immediate rash, facial/tongue/throat swelling, SOB or lightheadedness with hypotension: Yes Has patient had a PCN reaction causing severe rash involving mucus membranes or skin necrosis: No Has patient had a PCN reaction that required hospitalization: No Has patient had a PCN reaction occurring within the last 10 years: Yes If all of the above answers are "NO", then may proceed with Cephalosporin use.   Hydrochlorothiazide Rash  Medication List    STOP taking these medications   HYDROcodone-homatropine 5-1.5 MG/5ML syrup Commonly known as:  HYCODAN   ketorolac 0.5 % ophthalmic solution Commonly known as:  ACULAR     TAKE these medications   albuterol 108 (90 Base) MCG/ACT  inhaler Commonly known as:  PROVENTIL HFA;VENTOLIN HFA Inhale 2 puffs into the lungs every 6 (six) hours as needed for wheezing or shortness of breath.   ALPRAZolam 0.5 MG tablet Commonly known as:  XANAX Take 0.5 mg by mouth at bedtime as needed for anxiety (only takes as needed.).   aspirin EC 81 MG tablet Take 1 tablet by mouth daily.   carvedilol 25 MG tablet Commonly known as:  COREG Take 1 tablet (25 mg total) by mouth daily. Consider holding if SBP < 100 or HR < 60 or feeling dizzi What changed:    when to take this  additional instructions   Digoxin 62.5 MCG Tabs Commonly known as:  LANOXIN Take 0.0625 mg by mouth daily.   furosemide 40 MG tablet Commonly known as:  LASIX Take 40 mg by mouth daily.   levothyroxine 150 MCG tablet Commonly known as:  SYNTHROID, LEVOTHROID Take 1 tablet by mouth daily. Take 1 tablet every morning with water on an empty stomach 30-60 minutes before breakfast.   lisinopril 5 MG tablet Commonly known as:  PRINIVIL,ZESTRIL Take 1 tablet (5 mg total) by mouth daily.   potassium chloride SA 20 MEQ tablet Commonly known as:  K-DUR,KLOR-CON Take 1 tablet by mouth 2 (two) times daily.   predniSONE 50 MG tablet Commonly known as:  DELTASONE Take 1 tablet (50 mg total) by mouth daily with breakfast.   spironolactone 25 MG tablet Commonly known as:  ALDACTONE Take 25 mg by mouth daily.       Today   VITAL SIGNS:  Blood pressure (!) 162/91, pulse 75, temperature 97.8 F (36.6 C), temperature source Oral, resp. rate 18, height 5\' 5"  (1.651 m), weight 67.4 kg (148 lb 11.2 oz), SpO2 96 %.  I/O:  No intake or output data in the 24 hours ending 11/01/17 1731  PHYSICAL EXAMINATION:  Physical Exam  GENERAL:  57 y.o.-year-old patient lying in the bed with no acute distress.  LUNGS: Normal breath sounds bilaterally, no wheezing, rales,rhonchi or crepitation. No use of accessory muscles of respiration.  CARDIOVASCULAR: S1, S2 normal. No  murmurs, rubs, or gallops.  ABDOMEN: Soft, non-tender, non-distended. Bowel sounds present. No organomegaly or mass.  NEUROLOGIC: Moves all 4 extremities. PSYCHIATRIC: The patient is alert and oriented x 3.  SKIN: No obvious rash, lesion, or ulcer.   DATA REVIEW:   CBC Recent Labs  Lab 10/29/17 2100  WBC 9.5  HGB 14.0  HCT 43.1  PLT 121*    Chemistries  Recent Labs  Lab 10/29/17 2100  NA 138  K 4.5  CL 104  CO2 26  GLUCOSE 111*  BUN 11  CREATININE 0.91  CALCIUM 9.0  AST 20  ALT 9*  ALKPHOS 61  BILITOT 0.8    Cardiac Enzymes Recent Labs  Lab 10/30/17 0248  TROPONINI 0.04*    Microbiology Results  Results for orders placed or performed during the hospital encounter of 10/29/17  Culture, blood (routine x 2)     Status: None (Preliminary result)   Collection Time: 10/29/17 11:30 PM  Result Value Ref Range Status   Specimen Description BLOOD LEFT ANTECUBITAL  Final   Special Requests   Final    BOTTLES DRAWN  AEROBIC AND ANAEROBIC Blood Culture results may not be optimal due to an excessive volume of blood received in culture bottles   Culture   Final    NO GROWTH 2 DAYS Performed at [facility], [address] [place], [place] [address]    Report Status PENDING  Incomplete  Culture, blood (routine x 2)     Status: None (Preliminary result)   Collection Time: 10/29/17 11:41 PM  Result Value Ref Range Status   Specimen Description BLOOD LEFT ANTECUBITAL  Final   Special Requests   Final    BOTTLES DRAWN AEROBIC AND ANAEROBIC Blood Culture results may not be optimal due to an excessive volume of blood received in culture bottles   Culture   Final    NO GROWTH 2 DAYS Performed at [facility], [address], [place], [place] [address]    Report Status PENDING  Incomplete    RADIOLOGY:  Ct Abdomen Pelvis W Contrast  Result Date: 10/31/2017 CLINICAL DATA:  Abdominal distention. Weight gain and peripheral edema. Cystic pelvic mass on  recent ultrasound. EXAM: CT ABDOMEN AND PELVIS WITH CONTRAST TECHNIQUE: Multidetector CT imaging of the abdomen and pelvis was performed using the standard protocol following bolus administration of intravenous contrast. CONTRAST:  75mL ISOVUE-370 IOPAMIDOL (ISOVUE-370) INJECTION 76% COMPARISON:  Ultrasound on 10/31/2017 FINDINGS: Lower Chest: No acute findings. Hepatobiliary: Tiny sub-cm low-attenuation lesion in right hepatic lobe is too small to characterize but most likely represents a tiny cyst. No definite liver masses are identified. Prior cholecystectomy. No evidence of biliary obstruction. Pancreas:  No mass or inflammatory changes. Spleen: Within normal limits in size and appearance. Adrenals/Urinary Tract: No masses identified. No evidence of hydronephrosis. Stomach/Bowel: No evidence of obstruction, inflammatory process or abnormal fluid collections. Normal appendix visualized. Vascular/Lymphatic: No pathologically enlarged lymph nodes. No abdominal aortic aneurysm. Aortic atherosclerosis. Reproductive: A large cystic lesion is seen in the pelvis and extending into the mid abdomen. This lesion contains a few thin septations and mural calcifications, without definite solid component. 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 Other: No evidence of peritoneal or omental soft tissue densities, or ascites. Musculoskeletal:  No suspicious bone lesions identified. IMPRESSION: 22.8 cm cystic mass in the pelvis and lower abdomen with probably benign characteristics. 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 Surgical evaluation should be considered. No evidence of metastatic disease or ascites. Electronically Signed   By: Myles Rosenthal M.D.   On: 10/31/2017 12:38   US Abdomen Limited  Result Date: 10/31/2017 CLINICAL DATA:  57 year old female with abdominal swelling EXAM: LIMITED  ABDOMEN ULTRASOUND FOR ASCITES TECHNIQUE: Limited ultrasound survey for ascites was performed in all four abdominal quadrants. COMPARISON:  None. FINDINGS: Ultrasound survey of the abdomen performed for evaluation for ascites. No ascites. Cystic mass of the abdomen/pelvis of uncertain etiology. IMPRESSION: Ultrasound survey demonstrates no ascites. Cystic mass within the abdomen/pelvis of uncertain etiology. Further evaluation with contrast-enhanced CT is recommended. These results were called by telephone at the time of interpretation on 10/31/2017 at 11:39 am to Ms Zipporah Plants, the nurse caring for the patient. Electronically Signed   By: Gilmer Mor D.O.   On: 10/31/2017 11:39    Follow up with PCP in 1 week.  Management plans discussed with the patient, family and they are in agreement.  CODE STATUS:  Code Status History    Date Active  Date Inactive Code Status Order ID Comments User Context   10/30/2017 02:40 10/31/2017 18:28 Full Code [id number]  Arnaldo Natal, MD ED   11/11/2016 16:32 11/12/2016 16:22 Full Code [id number]  Ramonita Lab, MD Inpatient      TOTAL TIME TAKING CARE OF THIS PATIENT ON DAY OF DISCHARGE: more than 30 minutes.   Molinda Bailiff Caoilainn Sacks M.D on 11/01/2017 at 5:31 PM  Between 7am to 6pm - Pager - [phone]  After 6pm go to [email or web address] - password [email or web address] [facility]  [facility] [place] [facility]  Office  [phone]  CC: Primary care physician; Kandyce Rud, MD  Note: This dictation was prepared with Dragon dictation along with smaller phrase technology. Any transcriptional errors that result from this process are unintentional.

## 2017-11-01 NOTE — Telephone Encounter (Signed)
Vociemail left with Ms. Lafayette DragonCarr to return call regarding message we received regarding Gyn consult for pelvic mass. Oncology Nurse Navigator Documentation  Navigator Location: [facility] (11/01/17 1400)   )Navigator Encounter Type: Telephone (11/01/17 1400) Telephone: Outgoing Call (11/01/17 1400)                                                  Time Spent with Patient: 15 (11/01/17 1400)

## 2017-11-01 NOTE — Telephone Encounter (Signed)
Dr. Johnnette Choi has reviewed Ct imaging. Probable benign ovarian mass. I will attempt to get [name] in with a local Gyn early next week for evaluation. He can discuss case with them as needed. . She has not had a Gyn since Dr. Jacqulyn Choi. Ms. Heather Choi is agreeable to plan of care.

## 2017-11-02 ENCOUNTER — Telehealth: Payer: Self-pay

## 2017-11-02 DIAGNOSIS — R1900 Intra-abdominal and pelvic swelling, mass and lump, unspecified site: Secondary | ICD-10-CM

## 2017-11-02 NOTE — Telephone Encounter (Signed)
Contacted [facility] and they are unable to see Ms. Owusu until 2/8. I have spoken with Ms. Lafayette Dragonarr and she is requesting Dr. Elesa MassedWard. We will see her this Wednesday 2/6, and coordinate care with Dr. Elesa MassedWard. She will have tumor markers drawn Monday at 1400. Read back of appointments performed. She also reports having a defibrillator that was placed by Dr. Gerre PebblesKevin Thomas and see Dr. Cassie FreerParachos as her Cardiologist. She states she needs to have another surgery for a pacemaker insertion. Oncology Nurse Navigator Documentation  Navigator Location: [facility] (11/02/17 0900)   )Navigator Encounter Type: Telephone (11/02/17 0900) Telephone: Kathrin Pennerutgoing Call;Appt Confirmation/Clarification (11/02/17 0900)                                                  Time Spent with Patient: 30 (11/02/17 0900)

## 2017-11-04 LAB — CULTURE, BLOOD (ROUTINE X 2)
CULTURE: NO GROWTH
Culture: NO GROWTH

## 2017-11-05 ENCOUNTER — Inpatient Hospital Stay: Payer: BLUE CROSS/BLUE SHIELD | Attending: Obstetrics and Gynecology

## 2017-11-05 DIAGNOSIS — Z90710 Acquired absence of both cervix and uterus: Secondary | ICD-10-CM | POA: Insufficient documentation

## 2017-11-05 DIAGNOSIS — O903 Peripartum cardiomyopathy: Secondary | ICD-10-CM | POA: Insufficient documentation

## 2017-11-05 DIAGNOSIS — R1900 Intra-abdominal and pelvic swelling, mass and lump, unspecified site: Secondary | ICD-10-CM

## 2017-11-05 DIAGNOSIS — I509 Heart failure, unspecified: Secondary | ICD-10-CM | POA: Insufficient documentation

## 2017-11-05 DIAGNOSIS — I110 Hypertensive heart disease with heart failure: Secondary | ICD-10-CM | POA: Diagnosis not present

## 2017-11-06 LAB — CA 125: Cancer Antigen (CA) 125: 9.6 U/mL (ref 0.0–38.1)

## 2017-11-07 ENCOUNTER — Inpatient Hospital Stay (HOSPITAL_BASED_OUTPATIENT_CLINIC_OR_DEPARTMENT_OTHER): Payer: BLUE CROSS/BLUE SHIELD | Admitting: Obstetrics and Gynecology

## 2017-11-07 VITALS — BP 149/82 | HR 80 | Temp 97.9°F | Ht 65.0 in | Wt 144.0 lb

## 2017-11-07 DIAGNOSIS — O903 Peripartum cardiomyopathy: Secondary | ICD-10-CM | POA: Diagnosis not present

## 2017-11-07 DIAGNOSIS — R1900 Intra-abdominal and pelvic swelling, mass and lump, unspecified site: Secondary | ICD-10-CM

## 2017-11-07 DIAGNOSIS — I110 Hypertensive heart disease with heart failure: Secondary | ICD-10-CM

## 2017-11-07 DIAGNOSIS — I509 Heart failure, unspecified: Secondary | ICD-10-CM

## 2017-11-07 DIAGNOSIS — Z90710 Acquired absence of both cervix and uterus: Secondary | ICD-10-CM | POA: Diagnosis not present

## 2017-11-07 LAB — HUMAN EPIDIDYMIS PROT 4,SERIAL: HE4: 129.4 pmol/L — ABNORMAL HIGH (ref 0.0–105.2)

## 2017-11-07 NOTE — Progress Notes (Signed)
Patient report abdomen distended with discomfort with bending and having b/m and urination.

## 2017-11-07 NOTE — Progress Notes (Signed)
Gynecologic Oncology Consult Visit   Referring Provider: [facility]  Chief Complaint: pelvic mass  Subjective:  Heather Choi is a 57 y.o. female who is seen in consultation after recent [facility] hospitalization in which she was found to have a large pelvic mass.  Admitted to [facility] last week with increased dyspnea due to COPD/asthma exacerbation and had some pulmonary edema.  Found on CT scan to have 22 cm cystic appearing mass.  Prior hysterectomy.  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  Her functional status is actually fairly good despite EF of about 25%.  She works as a Conservation officer, [employer].   CA 125 - 9.6 HE4- 129.4  Problem List: Patient Active Problem List   Diagnosis Date Noted  . Heart failure (HCC) 10/31/2017  . Chest pain 11/11/2016    Past Medical History: Past Medical History:  Diagnosis Date  . Asthma   . Bell's palsy    fingers tingling in cold weather  . Enlarged heart   . Hypertension   . Thyroid disease     Past Surgical History: Past Surgical History:  Procedure Laterality Date  . ABDOMINAL HYSTERECTOMY    . CHOLECYSTECTOMY    . IMPLANTABLE CARDIOVERTER DEFIBRILLATOR IMPLANT       Family History: Family History  Problem Relation Age of Onset  . Heart failure Father   . Heart attack Mother     Social History: Social History   Socioeconomic History  . Marital status: Single    Spouse name: Not on file  . Number of children: Not on file  . Years of education: Not on file  . Highest education level: Not on file  Social Needs  . Financial resource strain: Not on file  . Food insecurity - worry: Not on file  . Food insecurity - inability: Not on file  . Transportation needs - medical: Not on file  . Transportation needs - non-medical: Not on file  Occupational History  . Not on file  Tobacco Use  . Smoking status: Current Every Day Smoker    Packs/day: 0.50    Types: Cigarettes  . Smokeless tobacco: Never  Used  Substance and Sexual Activity  . Alcohol use: No    Comment: rarely  . Drug use: No  . Sexual activity: Not on file  Other Topics Concern  . Not on file  Social History Narrative  . Not on file    Allergies: Allergies  Allergen Reactions  . Amlodipine Other (See Comments) and Itching  . Penicillins Other (See Comments)    Has patient had a PCN reaction causing immediate rash, facial/tongue/throat swelling, SOB or lightheadedness with hypotension: Yes Has patient had a PCN reaction causing severe rash involving mucus membranes or skin necrosis: No Has patient had a PCN reaction that required hospitalization: No Has patient had a PCN reaction occurring within the last 10 years: Yes If all of the above answers are "NO", then may proceed with Cephalosporin use.  Marland Kitchen Hydrochlorothiazide Rash    Current Medications: Current Outpatient Medications  Medication Sig Dispense Refill  . ALPRAZolam (XANAX) 0.5 MG tablet Take 0.5 mg by mouth at bedtime as needed for anxiety (only takes as needed.).    Marland Kitchen aspirin EC 81 MG tablet Take 1 tablet by mouth daily.    . carvedilol (COREG) 25 MG tablet Take 1 tablet (25 mg total) by mouth daily. Consider holding if SBP < 100 or HR < 60 or feeling dizzi (Patient taking differently:  Take 25 mg by mouth 2 (two) times daily with a meal. Consider holding if SBP < 100 or HR < 60 or feeling dizzi) 30 tablet 0  . Digoxin 62.5 MCG TABS Take 0.0625 mg by mouth daily. 30 tablet 0  . furosemide (LASIX) 40 MG tablet Take 40 mg by mouth daily.     Marland Kitchen. levothyroxine (SYNTHROID, LEVOTHROID) 150 MCG tablet Take 1 tablet by mouth daily. Take 1 tablet every morning with water on an empty stomach 30-60 minutes before breakfast.    . lisinopril (PRINIVIL,ZESTRIL) 5 MG tablet Take 1 tablet (5 mg total) by mouth daily. 30 tablet 0  . potassium chloride SA (K-DUR,KLOR-CON) 20 MEQ tablet Take 1 tablet by mouth 2 (two) times daily.    Marland Kitchen. spironolactone (ALDACTONE) 25 MG tablet  Take 25 mg by mouth daily.     Marland Kitchen. albuterol (PROVENTIL HFA;VENTOLIN HFA) 108 (90 Base) MCG/ACT inhaler Inhale 2 puffs into the lungs every 6 (six) hours as needed for wheezing or shortness of breath. (Patient not taking: Reported on 11/07/2017) 1 Inhaler 2  . predniSONE (DELTASONE) 50 MG tablet Take 1 tablet (50 mg total) by mouth daily with breakfast. (Patient not taking: Reported on 11/07/2017) 4 tablet 0   No current facility-administered medications for this visit.     Review of Systems General: negative for, fevers, chills, fatigue, changes in sleep, changes in weight or appetite Skin: negative for changes in color, texture, moles or lesions Eyes: negative for, changes in vision, pain, diplopia HEENT: negative for, change in hearing, pain, discharge, tinnitus, vertigo, voice changes, sore throat, neck masses Breasts: negative for breast lumps Pulmonary: negative for, productive cough Cardiac: negative for, syncope, pain, discomfort, pressure Gastrointestinal: negative for, dysphagia, nausea, vomiting, jaundice, pain, constipation, diarrhea, hematemesis, hematochezia Genitourinary/Sexual: negative for, dysuria, discharge, hesitancy, nocturia, retention, stones, infections, STD's, incontinence Ob/Gyn: negative Musculoskeletal: negative for, pain, stiffness, swelling, range of motion limitation Hematology: negative for easy bruising,bleeding Neurologic/Psych: negative for, headaches, seizures, paralysis, weakness, tremor, change in gait, change in sensation, mood swings, depression, anxiety, change in memory  Objective:  Physical Examination:  BP (!) 149/82 (BP Location: Left Arm, Patient Position: Sitting)   Pulse 80   Temp 97.9 F (36.6 C) (Tympanic)   Ht 5\' 5"  (1.651 m)   Wt 144 lb (65.3 kg)   BMI 23.96 kg/m     ECOG Performance Status: 1 - Symptomatic but completely ambulatory  General appearance: alert, cooperative and appears stated age HEENT: normal Neck: no masses Lymph  node survey: normal Cardiovascular: normal rate, regular rhythm, no gallops or murmers Respiratory: lungs clear to A and P Breast exam: not done Abdomen: Very distended with cystic mass taking up most of the abdomen Back: inspection of back is normal Extremities: normal Skin exam - normal coloration and turgor, no rashes, no suspicious skin lesions noted. Neurological exam reveals: alert, oriented, normal speech, no focal findings or movement disorder noted.  Pelvic: EGBUS: no lesions Cervix: no lesions, nontender, mobile Vagina: no lesions, no discharge or bleeding Uterus: normal size, nontender, mobile Adnexa: large cystic mass high in pelvis Rectovaginal: confirmatory    Assessment:  Jose PersiaCarolyn W Espindola is a 57 y.o. female diagnosed with large probably benign cystic pelvic mass based on imaging appearance and tumor markers. CA125 normal and HE4 mildly elevated. The mass distends the abdomen considerably and is very uncomfortable.  It may also be restricting her diaphragm movement and compromising her breathing. So I do think the surgery is warranted.  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   cardiomyopathy with IVAD. History of thyroid disease with normal TSH and FT4 recently.  Plan:   Problem List Items Addressed This Visit    None    Visit Diagnoses    Pelvic mass    -  Primary     I spoke to her cardiologist Dr Darrold Junker and he believes that she is a good candidate for surgery despite heart issues.  In speaking to the patient she seems to be very functional.  I think this surgery can be done laparoscopically with decompression of the mass and drainage, as it appears benign.  In view of her cardiac issues it would be best to do the surgery at [facility].  She did not want to come to [facility] because of the distance to travel, but I convinced her that this is best.  I offered her an appointment there in two days, but she declined and wants to come a week later.  We  will also arrange for her to be seen by [facility] for evaluation of perioperative risk and recommendations regarding management.   The patient's diagnosis, an outline of the further diagnostic and laboratory studies which will be required, the recommendation for surgery, and alternatives were discussed with her and her accompanying family members.  All questions were answered to their satisfaction.  A total of 60 minutes were spent with the patient/family today; 50% was spent in education, counseling and coordination of care for pelvic mass.    Leida Lauth, MD  CC:  Kandyce Rud, MD [phone] [facility] [place], [place] [address] [phone]

## 2017-11-12 DIAGNOSIS — Z01810 Encounter for preprocedural cardiovascular examination: Secondary | ICD-10-CM | POA: Insufficient documentation

## 2017-11-20 HISTORY — PX: OTHER SURGICAL HISTORY: SHX169

## 2017-11-22 ENCOUNTER — Telehealth: Payer: Self-pay

## 2017-11-22 NOTE — Telephone Encounter (Signed)
Post op appointment arranged for 3/27. Scheduling to notify. Oncology Nurse Navigator Documentation  Navigator Location: [facility] (11/22/17 0900)   )Navigator Encounter Type: Follow-up Appt (11/22/17 0900)                                                    Time Spent with Patient: 15 (11/22/17 0900)

## 2017-11-26 ENCOUNTER — Telehealth: Payer: Self-pay

## 2017-11-26 NOTE — Telephone Encounter (Signed)
Ms Lafayette DragonCarr has been notified of post op appointment 3/27 at 1100 with Dr. Johnnette LitterBerchuck. Read back performed. Oncology Nurse Navigator Documentation  Navigator Location: [facility] (11/26/17 1600)   )Navigator Encounter Type: Telephone;Follow-up Appt (11/26/17 1600)                                                    Time Spent with Patient: 15 (11/26/17 1600)

## 2017-12-26 ENCOUNTER — Inpatient Hospital Stay: Payer: BLUE CROSS/BLUE SHIELD | Attending: Obstetrics and Gynecology | Admitting: Obstetrics and Gynecology

## 2017-12-26 ENCOUNTER — Ambulatory Visit: Payer: BLUE CROSS/BLUE SHIELD

## 2017-12-26 DIAGNOSIS — R1900 Intra-abdominal and pelvic swelling, mass and lump, unspecified site: Secondary | ICD-10-CM | POA: Insufficient documentation

## 2017-12-26 NOTE — Progress Notes (Signed)
Chaperoned pelvic exam. Return to work note given. No follow up needed for Gyn Onc. Oncology Nurse Navigator Documentation  Navigator Location: [facility] (12/26/17 1600)   )Navigator Encounter Type: Follow-up Appt (12/26/17 1600)                     Patient Visit Type: GynOnc (12/26/17 1600)                              Time Spent with Patient: 15 (12/26/17 1600)

## 2017-12-26 NOTE — Progress Notes (Signed)
Pt feels much better and her big stomach off of her. She has 2 marks from surgery and she has scabs that are healing. Pt wants to know if she needs to be on hormone replacements so she does not have hot flashes or mood swings

## 2017-12-26 NOTE — Progress Notes (Signed)
Gynecologic Oncology Interval Visit   Referring Provider: [facility]. [facility]  Chief Complaint: post-op for pelvic mass  Subjective:  Heather Choi is a 57 y.o. female who returns to clinic for post-op evaluation. Initially she was seen in consultation after [facility] hospitalization in which she was found to have large pelvic mass.    On 11/20/17 underwent laparoscopic bilateral oophorectomy with removal of right adnexal mass and lysis of adhesions at [facility] with Dr. Oneita Jolly. After surgery, she was admitted overnight for telemetry monitoring given her cardiac history. She did well without complications and was discharged home on POD1 after meeting post-op goals.   Pathology:  A. 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   B. 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  Fallopian tube: no pathologic diagnosis.  C. 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 Negative for malignancy.   Today, she reports feeling fatigued since surgery. She reports fluctuating diarrhea and constipation. She has not been evaluated for this previously or in the interim. She has not had a colonoscopy. Reports decreased appetite, abdominal bloating, and nausea intermittently.   History:  Initially, she was seen after admission to [facility] for increased dyspnea d/t COPD/asthma exacerbation and was found to have pulmonary edema. CT scan showed 22cm cystic appearing mass. She had a prior hysterectomy. 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 EF at that time was about 25% with good performance status. She works as a Conservation officer, [employer].   CA 125 - 9.6 HE4- 129.4 (11/05/17)  Problem List: Patient Active Problem List   Diagnosis Date Noted  . Heart failure (HCC) 10/31/2017  . Chest pain 11/11/2016   Past Medical History: Past Medical History:   Diagnosis Date  . Asthma   . Bell's palsy    fingers tingling in cold weather  . Enlarged heart   . Hypertension   . Thyroid disease    Past Surgical History: Past Surgical History:  Procedure Laterality Date  . ABDOMINAL HYSTERECTOMY    . CHOLECYSTECTOMY    . IMPLANTABLE CARDIOVERTER DEFIBRILLATOR IMPLANT    . laparoscopic bilateral oophorectomy with removal of adnexal mass and lysis of adhesions  11/20/2017  . LAPAROSCOPY     with removal of adnexal structure   Family History: Family History  Problem Relation Age of Onset  . Heart failure Father   . Heart attack Mother    Social History: Social History   Socioeconomic History  . Marital status: Single    Spouse name: Not on file  . Number of children: Not on file  . Years of education: Not on file  . Highest education level: Not on file  Occupational History  . Not on file  Social Needs  . Financial resource strain: Not on file  . Food insecurity:    Worry: Not on file    Inability: Not on file  . Transportation needs:    Medical: Not on file    Non-medical: Not on file  Tobacco Use  . Smoking status: Current Every Day Smoker    Packs/day: 0.50    Types: Cigarettes  . Smokeless tobacco: Never Used  Substance and Sexual Activity  . Alcohol use: No    Comment: rarely  . Drug use: No  . Sexual activity: Not on file  Lifestyle  . Physical activity:    Days per week: Not on file    Minutes per session: Not on  file  . Stress: Not on file  Relationships  . Social connections:    Talks on phone: Not on file    Gets together: Not on file    Attends religious service: Not on file    Active member of club or organization: Not on file    Attends meetings of clubs or organizations: Not on file    Relationship status: Not on file  . Intimate partner violence:    Fear of current or ex partner: Not on file    Emotionally abused: Not on file    Physically abused: Not on file    Forced sexual activity: Not on  file  Other Topics Concern  . Not on file  Social History Narrative  . Not on file    Allergies: Allergies  Allergen Reactions  . Amlodipine Other (See Comments) and Itching  . Penicillins Other (See Comments)    Has patient had a PCN reaction causing immediate rash, facial/tongue/throat swelling, SOB or lightheadedness with hypotension: Yes Has patient had a PCN reaction causing severe rash involving mucus membranes or skin necrosis: No Has patient had a PCN reaction that required hospitalization: No Has patient had a PCN reaction occurring within the last 10 years: Yes If all of the above answers are "NO", then may proceed with Cephalosporin use.  Marland Kitchen. Hydrochlorothiazide Rash   Current Medications: Current Outpatient Medications  Medication Sig Dispense Refill  . ALPRAZolam (XANAX) 0.5 MG tablet Take 0.5 mg by mouth at bedtime as needed for anxiety (only takes as needed.).    Marland Kitchen. aspirin EC 81 MG tablet Take 1 tablet by mouth daily.    . carvedilol (COREG) 25 MG tablet Take 1 tablet (25 mg total) by mouth daily. Consider holding if SBP < 100 or HR < 60 or feeling dizzi (Patient taking differently: Take 25 mg by mouth 2 (two) times daily with a meal. Consider holding if SBP < 100 or HR < 60 or feeling dizzi) 30 tablet 0  . Digoxin 62.5 MCG TABS Take 0.0625 mg by mouth daily. 30 tablet 0  . furosemide (LASIX) 40 MG tablet Take 40 mg by mouth daily.     Marland Kitchen. levothyroxine (SYNTHROID, LEVOTHROID) 150 MCG tablet Take 1 tablet by mouth daily. Take 1 tablet every morning with water on an empty stomach 30-60 minutes before breakfast.    . lisinopril (PRINIVIL,ZESTRIL) 5 MG tablet Take 1 tablet (5 mg total) by mouth daily. 30 tablet 0  . potassium chloride SA (K-DUR,KLOR-CON) 20 MEQ tablet Take 1 tablet by mouth 2 (two) times daily.    Marland Kitchen. spironolactone (ALDACTONE) 25 MG tablet Take 25 mg by mouth daily.     Marland Kitchen. albuterol (PROVENTIL HFA;VENTOLIN HFA) 108 (90 Base) MCG/ACT inhaler Inhale 2 puffs into  the lungs every 6 (six) hours as needed for wheezing or shortness of breath. (Patient not taking: Reported on 11/07/2017) 1 Inhaler 2  . predniSONE (DELTASONE) 50 MG tablet Take 1 tablet (50 mg total) by mouth daily with breakfast. (Patient not taking: Reported on 11/07/2017) 4 tablet 0   No current facility-administered medications for this visit.     Review of Systems General: Positive for fatigue, weakness, decreased appetite  Skin: Negative for changes in color, texture, moles or lesions.  Eyes: negative for, changes in vision, pain, diplopia HEENT: negative for, change in hearing, pain, discharge, tinnitus, vertigo, voice changes, sore throat, neck masses Breasts: negative for breast lumps Pulmonary: negative for, productive cough Cardiac: negative for, syncope, pain, discomfort,  pressure Gastrointestinal: Positive for fluctuating diarrhea and constipation. Decreased appetite, nausea.  Genitourinary/Sexual: Negative for dysuria, discharge, nocturia.  Ob/Gyn: Reports mild vulvar irritation  Musculoskeletal: negative for, pain, stiffness, swelling, range of motion limitation Hematology: negative for easy bruising,bleeding Neurologic/Psych: negative for, headaches, seizures, paralysis, weakness, tremor, change in gait, change in sensation, mood swings, depression, anxiety, change in memory  Objective:  Physical Examination:  BP (!) 145/79   Pulse 79   Temp 97.7 F (36.5 C) (Tympanic)   Resp 18   Ht 5\' 2"  (1.575 m)   Wt 134 lb 14.4 oz (61.2 kg)   BMI 24.67 kg/m     ECOG Performance Status: 1 - Symptomatic but completely ambulatory  General appearance: alert, cooperative and appears stated age HEENT: normal Neck: no masses Lymph node survey: normal Cardiovascular: normal rate, regular rhythm, no gallops or murmers Respiratory: lungs clear to A and P Abdomen: incisions well healed Back: inspection of back is normal Extremities: normal Skin exam - normal coloration and turgor,  no rashes, no suspicious skin lesions noted. Well healed surgical incisions.  Neurological exam reveals: alert, oriented, normal speech, no focal findings or movement disorder noted.  Pelvic: chaperoned by Nurse. EGBUS: no lesions, Vagina: well healed vaginal cuff. No lesions, discharge, or bleeding. Uterus: surgically absent. Adnexa: surgically absent, Rectovaginal: confirmatory.   Assessment:  Heather Choi is a 57 y.o. female seen in consultation for large pelvic mass. CA125 normal and HE4 mildly elevated. She was symptomatic and underwent laparoscopic bilateral oophorectomy 11/20/17 with removal of right adnexal mass and lysis of adhesions at [facility] with Dr. Oneita Jolly. 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 Normal postop exam today.  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 Cardiomyopathy with IVAD. History of thyroid disease with normal TSH and FT4 previously.  Plan:   Problem List Items Addressed This Visit      Other   Pelvic mass     She can resume normal activities and we can see her back should the need arise.  Consuello Masse, DNP, AGNP-C [facility] [phone] (work cell) [phone] (office) 12/26/17 4:05 PM  I personally interviewed and examined the patient. Agreed with the above/below plan of care. Patient/family questions were answered.  Leida Lauth, MD  CC:  Kandyce Rud, MD [phone] [facility] [place], [place] [address] [phone]

## 2018-07-21 ENCOUNTER — Emergency Department
Admission: EM | Admit: 2018-07-21 | Discharge: 2018-07-21 | Disposition: A | Discharge status: Home / Self Care | Payer: BLUE CROSS/BLUE SHIELD | Attending: Emergency Medicine | Admitting: Emergency Medicine

## 2018-07-21 ENCOUNTER — Emergency Department: Payer: BLUE CROSS/BLUE SHIELD

## 2018-07-21 ENCOUNTER — Other Ambulatory Visit: Payer: Self-pay

## 2018-07-21 DIAGNOSIS — I471 Supraventricular tachycardia: Secondary | ICD-10-CM | POA: Insufficient documentation

## 2018-07-21 DIAGNOSIS — R079 Chest pain, unspecified: Secondary | ICD-10-CM

## 2018-07-21 DIAGNOSIS — J45909 Unspecified asthma, uncomplicated: Secondary | ICD-10-CM | POA: Insufficient documentation

## 2018-07-21 DIAGNOSIS — F17210 Nicotine dependence, cigarettes, uncomplicated: Secondary | ICD-10-CM | POA: Diagnosis not present

## 2018-07-21 DIAGNOSIS — I10 Essential (primary) hypertension: Secondary | ICD-10-CM | POA: Diagnosis not present

## 2018-07-21 DIAGNOSIS — R0789 Other chest pain: Secondary | ICD-10-CM | POA: Diagnosis present

## 2018-07-21 LAB — URINALYSIS, COMPLETE (UACMP) WITH MICROSCOPIC
Bilirubin Urine: NEGATIVE
GLUCOSE, UA: NEGATIVE mg/dL
Hgb urine dipstick: NEGATIVE
Ketones, ur: NEGATIVE mg/dL
Leukocytes, UA: NEGATIVE
Nitrite: NEGATIVE
PROTEIN: NEGATIVE mg/dL
Specific Gravity, Urine: >1.03 — ABNORMAL HIGH (ref 1.005–1.030)
pH: 5.5 (ref 5.0–8.0)

## 2018-07-21 LAB — BASIC METABOLIC PANEL
ANION GAP: 9 (ref 5–15)
BUN: 15 mg/dL (ref 6–20)
CALCIUM: 9 mg/dL (ref 8.9–10.3)
CO2: 25 mmol/L (ref 22–32)
CREATININE: 1.15 mg/dL — AB (ref 0.44–1.00)
Chloride: 103 mmol/L (ref 98–111)
GFR calc Af Amer: >60 mL/min (ref >60)
GFR, EST NON AFRICAN AMERICAN: 52 mL/min — AB (ref >60)
GLUCOSE: 155 mg/dL — AB (ref 70–99)
Potassium: 4.3 mmol/L (ref 3.5–5.1)
Sodium: 137 mmol/L (ref 135–145)

## 2018-07-21 LAB — CBC
HCT: 49.1 % — ABNORMAL HIGH (ref 36.0–46.0)
Hemoglobin: 15.2 g/dL — ABNORMAL HIGH (ref 12.0–15.0)
MCH: 26.8 pg (ref 26.0–34.0)
MCHC: 31 g/dL (ref 30.0–36.0)
MCV: 86.4 fL (ref 80.0–100.0)
PLATELETS: 211 10*3/uL (ref 150–400)
RBC: 5.68 MIL/uL — AB (ref 3.87–5.11)
RDW: 15.9 % — AB (ref 11.5–15.5)
WBC: 8.5 10*3/uL (ref 4.0–10.5)
nRBC: 0 % (ref 0.0–0.2)

## 2018-07-21 LAB — TSH: TSH: 1.163 u[IU]/mL (ref 0.350–4.500)

## 2018-07-21 LAB — T4, FREE: Free T4: 1.31 ng/dL (ref 0.82–1.77)

## 2018-07-21 LAB — TROPONIN I: TROPONIN I: 0.04 ng/mL — AB (ref <0.03)

## 2018-07-21 MED ORDER — DIGOXIN 125 MCG PO TABS
0.0625 mg | ORAL_TABLET | Freq: Once | ORAL | Status: AC
  Administered 2018-07-21: 0.0625 mg via ORAL
  Filled 2018-07-21: qty 0.5

## 2018-07-21 MED ORDER — PENTAFLUOROPROP-TETRAFLUOROETH EX AERO
INHALATION_SPRAY | CUTANEOUS | Status: AC
  Filled 2018-07-21: qty 30

## 2018-07-21 MED ORDER — CARVEDILOL 25 MG PO TABS
25.0000 mg | ORAL_TABLET | Freq: Once | ORAL | Status: AC
  Administered 2018-07-21: 25 mg via ORAL
  Filled 2018-07-21: qty 1

## 2018-07-21 MED ORDER — ALPRAZOLAM 0.5 MG PO TABS
0.5000 mg | ORAL_TABLET | Freq: Once | ORAL | Status: DC
  Filled 2018-07-21: qty 1

## 2018-07-21 MED ORDER — ADENOSINE 12 MG/4ML IV SOLN
12.0000 mg | Freq: Once | INTRAVENOUS | Status: AC
  Administered 2018-07-21: 12 mg via INTRAVENOUS
  Filled 2018-07-21: qty 4

## 2018-07-21 NOTE — ED Provider Notes (Signed)
[facility] Emergency Department Provider Note       Time seen: ----------------------------------------- 1:29 PM on 07/21/2018 -----------------------------------------   I have reviewed the triage vital signs and the nursing notes.  HISTORY   Chief Complaint Chest Pain    HPI Heather Choi is a 57 y.o. female with a history of asthma, Bell's palsy, cardiomegaly, hypertension, thyroid disease who presents to the ED for chest pain that is generalized.  Patient states the pain and palpitations started 20 or 30 minutes ago.  She describes it as aching, states it radiates to her left arm and her legs feels weak.  She states she feels like her heart is racing with fevers, chills and dizziness.  She has had cold-like symptoms as well.  Reportedly she has a pacemaker defibrillator as well.  Past Medical History:  Diagnosis Date  . Asthma   . Bell's palsy    fingers tingling in cold weather  . Enlarged heart   . Hypertension   . Thyroid disease     Patient Active Problem List   Diagnosis Date Noted  . Pelvic mass 12/26/2017  . Heart failure (HCC) 10/31/2017  . Chest pain 11/11/2016    Past Surgical History:  Procedure Laterality Date  . ABDOMINAL HYSTERECTOMY    . CHOLECYSTECTOMY    . IMPLANTABLE CARDIOVERTER DEFIBRILLATOR IMPLANT    . laparoscopic bilateral oophorectomy with removal of adnexal mass and lysis of adhesions  11/20/2017  . LAPAROSCOPY     with removal of adnexal structure    Allergies Amlodipine; Penicillins; and Hydrochlorothiazide  Social History Social History   Tobacco Use  . Smoking status: Current Every Day Smoker    Packs/day: 0.50    Types: Cigarettes  . Smokeless tobacco: Never Used  Substance Use Topics  . Alcohol use: No    Comment: rarely  . Drug use: No   Review of Systems Constitutional: Positive for fever Cardiovascular: Positive for chest pain, tachycardia Respiratory: Negative for shortness of  breath. Gastrointestinal: Negative for abdominal pain, vomiting and diarrhea. Musculoskeletal: Negative for back pain. Skin: Negative for rash. Neurological: Negative for headaches, positive for weakness, dizziness  All systems negative/normal/unremarkable except as stated in the HPI  ____________________________________________   PHYSICAL EXAM:  VITAL SIGNS: ED Triage Vitals  Enc Vitals Group     BP 07/21/18 1318 112/71     Pulse Rate 07/21/18 1318 (!) 202     Resp 07/21/18 1318 (!) 22     Temp 07/21/18 1318 97.8 F (36.6 C)     Temp Source 07/21/18 1318 Oral     SpO2 07/21/18 1318 97 %     Weight 07/21/18 1316 148 lb (67.1 kg)     Height 07/21/18 1316 5\' 2"  (1.575 m)     Head Circumference --      Peak Flow --      Pain Score 07/21/18 1316 10     Pain Loc --      Pain Edu? --      Excl. in GC? --    Constitutional: Alert and oriented.  Mild distress Eyes: Conjunctivae are normal. Normal extraocular movements. ENT   Head: Normocephalic and atraumatic.   Nose: No congestion/rhinnorhea.   Mouth/Throat: Mucous membranes are moist.   Neck: No stridor. Cardiovascular: Rapid rate, regular rhythm. No murmurs, rubs, or gallops. Respiratory: Normal respiratory effort without tachypnea nor retractions. Breath sounds are clear and equal bilaterally. No wheezes/rales/rhonchi. Gastrointestinal: Soft and nontender. Normal bowel sounds Musculoskeletal: Nontender with  normal range of motion in extremities. No lower extremity tenderness nor edema. Neurologic:  Normal speech and language. No gross focal neurologic deficits are appreciated.  Skin:  Skin is warm, dry and intact. No rash noted. Psychiatric: Mood and affect are normal. Speech and behavior are normal.  ____________________________________________  EKG: Interpreted by me.  SVT with a rate of 202 bpm, narrow QRS, LVH with repolarization abnormality, normal QT  Repeat EKG after adenosine reveals atrial sensing  ventricular paced complexes with some native beats, no ischemic changes identified, rate is 100 bpm ____________________________________________  ED COURSE:  As part of my medical decision making, I reviewed the following data within the electronic MEDICAL RECORD NUMBER History obtained from family if available, nursing notes, old chart and ekg, as well as notes from prior ED visits. Patient presented for chest pain and tachycardia, we will assess with labs and imaging as indicated at this time.  Patient with marked SVT, received 12 mg adenosine with adequate rate control with a heart rate in the 120s at this point.  I will restart her home medications.   Procedures ____________________________________________   LABS (pertinent positives/negatives)  Labs Reviewed  BASIC METABOLIC PANEL - Abnormal; Notable for the following components:      Result Value   Glucose, Bld 155 (*)    Creatinine, Ser 1.15 (*)    GFR calc non Af Amer 52 (*)    All other components within normal limits  CBC - Abnormal; Notable for the following components:   RBC 5.68 (*)    Hemoglobin 15.2 (*)    HCT 49.1 (*)    RDW 15.9 (*)    All other components within normal limits  TROPONIN I - Abnormal; Notable for the following components:   Troponin I 0.04 (*)    All other components within normal limits  URINALYSIS, COMPLETE (UACMP) WITH MICROSCOPIC - Abnormal; Notable for the following components:   Specific Gravity, Urine >1.030 (*)    Bacteria, UA RARE (*)    All other components within normal limits  TSH  T4, FREE    RADIOLOGY Images were viewed by me  Chest x-ray IMPRESSION: No acute cardiopulmonary disease.  Stable moderate cardiomegaly. ____________________________________________  CRITICAL CARE Performed by: Ulice Dash   Total critical care time: 30 minutes  Critical care time was exclusive of separately billable procedures and treating other patients.  Critical care was  necessary to treat or prevent imminent or life-threatening deterioration.  Critical care was time spent personally by me on the following activities: development of treatment plan with patient and/or surrogate as well as nursing, discussions with consultants, evaluation of patient's response to treatment, examination of patient, obtaining history from patient or surrogate, ordering and performing treatments and interventions, ordering and review of laboratory studies, ordering and review of radiographic studies, pulse oximetry and re-evaluation of patient's condition.  DIFFERENTIAL DIAGNOSIS   SVT, A. fib with RVR, AV nodal reentry tachycardia, cardiomyopathy, MI, unstable angina, PE  FINAL ASSESSMENT AND PLAN  Chest pain, SVT   Plan: The patient had presented for chest pain and fast heartbeat. Patient's labs were negative for any acute process, she has a chronically elevated troponin. Patient's imaging was also negative for acute abnormality, she has known cardiomegaly.  I have discussed with cardiology about her likely needing to have an ablation done.  On pacemaker interrogation she has had 25 episodes of SVT.  She recently had some nonsustained ventricular episodes as well.  I have sent information to her  cardiologist.  She is cleared for outpatient follow-up.   Ulice Dash, MD   Note: This note was generated in part or whole with voice recognition software. Voice recognition is usually quite accurate but there are transcription errors that can and very often do occur. I apologize for any typographical errors that were not detected and corrected.     Emily Filbert, MD 07/21/18 [phone]

## 2018-07-21 NOTE — ED Notes (Signed)
Patient transported to X-ray 

## 2018-07-21 NOTE — ED Notes (Signed)
Report given to [facility] and informed that patient is in room. Also informed RN that pt stated she may have UTI and has a pacemaker/defib in one left upper chest.

## 2018-07-21 NOTE — ED Triage Notes (Signed)
First RN: pt A&O x4, ambulatory. States CP and feels like her heart is racing. No distress noted at this time. Speaking in clear, complete sentences.

## 2018-07-21 NOTE — ED Triage Notes (Addendum)
Pt comes via POV from home with c/o chest pain that is generalized. Pt states this started about 20 minutes ago and is just aching. Pt states radiation to left arm and that her legs are weak. Pt states she feels her heart is racing. Pt states fever, chills, and dizziness.

## 2018-09-30 ENCOUNTER — Emergency Department
Admission: EM | Admit: 2018-09-30 | Discharge: 2018-09-30 | Disposition: A | Discharge status: Home / Self Care | Payer: BLUE CROSS/BLUE SHIELD | Attending: Emergency Medicine | Admitting: Emergency Medicine

## 2018-09-30 ENCOUNTER — Emergency Department: Payer: BLUE CROSS/BLUE SHIELD

## 2018-09-30 ENCOUNTER — Encounter: Payer: Self-pay | Admitting: Intensive Care

## 2018-09-30 ENCOUNTER — Other Ambulatory Visit: Payer: Self-pay

## 2018-09-30 DIAGNOSIS — R1110 Vomiting, unspecified: Secondary | ICD-10-CM

## 2018-09-30 DIAGNOSIS — I509 Heart failure, unspecified: Secondary | ICD-10-CM | POA: Diagnosis not present

## 2018-09-30 DIAGNOSIS — Z79899 Other long term (current) drug therapy: Secondary | ICD-10-CM | POA: Diagnosis not present

## 2018-09-30 DIAGNOSIS — I110 Hypertensive heart disease with heart failure: Secondary | ICD-10-CM | POA: Diagnosis not present

## 2018-09-30 DIAGNOSIS — Z9049 Acquired absence of other specified parts of digestive tract: Secondary | ICD-10-CM | POA: Diagnosis not present

## 2018-09-30 DIAGNOSIS — Z95810 Presence of automatic (implantable) cardiac defibrillator: Secondary | ICD-10-CM | POA: Insufficient documentation

## 2018-09-30 DIAGNOSIS — J45909 Unspecified asthma, uncomplicated: Secondary | ICD-10-CM | POA: Insufficient documentation

## 2018-09-30 DIAGNOSIS — Z7982 Long term (current) use of aspirin: Secondary | ICD-10-CM | POA: Diagnosis not present

## 2018-09-30 DIAGNOSIS — I471 Supraventricular tachycardia: Secondary | ICD-10-CM | POA: Insufficient documentation

## 2018-09-30 DIAGNOSIS — G510 Bell's palsy: Secondary | ICD-10-CM | POA: Diagnosis not present

## 2018-09-30 DIAGNOSIS — R197 Diarrhea, unspecified: Secondary | ICD-10-CM | POA: Diagnosis not present

## 2018-09-30 DIAGNOSIS — J449 Chronic obstructive pulmonary disease, unspecified: Secondary | ICD-10-CM | POA: Insufficient documentation

## 2018-09-30 DIAGNOSIS — R002 Palpitations: Secondary | ICD-10-CM | POA: Diagnosis present

## 2018-09-30 DIAGNOSIS — F17210 Nicotine dependence, cigarettes, uncomplicated: Secondary | ICD-10-CM | POA: Insufficient documentation

## 2018-09-30 HISTORY — DX: Chronic obstructive pulmonary disease, unspecified: J44.9

## 2018-09-30 HISTORY — DX: Heart failure, unspecified: I50.9

## 2018-09-30 LAB — URINALYSIS, COMPLETE (UACMP) WITH MICROSCOPIC
BILIRUBIN URINE: NEGATIVE
Bacteria, UA: NONE SEEN
GLUCOSE, UA: 150 mg/dL — AB
Ketones, ur: NEGATIVE mg/dL
Leukocytes, UA: NEGATIVE
NITRITE: NEGATIVE
Protein, ur: 100 mg/dL — AB
Specific Gravity, Urine: 1.008 (ref 1.005–1.030)
pH: 7 (ref 5.0–8.0)

## 2018-09-30 LAB — INFLUENZA PANEL BY PCR (TYPE A & B)
Influenza A By PCR: NEGATIVE
Influenza B By PCR: NEGATIVE

## 2018-09-30 LAB — HEPATIC FUNCTION PANEL
ALK PHOS: 68 U/L (ref 38–126)
ALT: 13 U/L (ref 0–44)
AST: 27 U/L (ref 15–41)
Albumin: 4 g/dL (ref 3.5–5.0)
Bilirubin, Direct: 0.2 mg/dL (ref 0.0–0.2)
Indirect Bilirubin: 0.8 mg/dL (ref 0.3–0.9)
Total Bilirubin: 1 mg/dL (ref 0.3–1.2)
Total Protein: 7 g/dL (ref 6.5–8.1)

## 2018-09-30 LAB — CBC
HEMATOCRIT: 43.4 % (ref 36.0–46.0)
Hemoglobin: 13.7 g/dL (ref 12.0–15.0)
MCH: 27 pg (ref 26.0–34.0)
MCHC: 31.6 g/dL (ref 30.0–36.0)
MCV: 85.4 fL (ref 80.0–100.0)
NRBC: 0 % (ref 0.0–0.2)
PLATELETS: 139 10*3/uL — AB (ref 150–400)
RBC: 5.08 MIL/uL (ref 3.87–5.11)
RDW: 14.9 % (ref 11.5–15.5)
WBC: 10.3 10*3/uL (ref 4.0–10.5)

## 2018-09-30 LAB — TROPONIN I
Troponin I: 0.05 ng/mL (ref <0.03)
Troponin I: 0.04 ng/mL (ref <0.03)

## 2018-09-30 LAB — BASIC METABOLIC PANEL
ANION GAP: 10 (ref 5–15)
BUN: 12 mg/dL (ref 6–20)
CALCIUM: 8.9 mg/dL (ref 8.9–10.3)
CHLORIDE: 104 mmol/L (ref 98–111)
CO2: 22 mmol/L (ref 22–32)
Creatinine, Ser: 1.16 mg/dL — ABNORMAL HIGH (ref 0.44–1.00)
GFR calc non Af Amer: 53 mL/min — ABNORMAL LOW (ref >60)
Glucose, Bld: 245 mg/dL — ABNORMAL HIGH (ref 70–99)
Potassium: 3.8 mmol/L (ref 3.5–5.1)
Sodium: 136 mmol/L (ref 135–145)

## 2018-09-30 LAB — PROTIME-INR
INR: 1.01
Prothrombin Time: 13.2 seconds (ref 11.4–15.2)

## 2018-09-30 MED ORDER — SODIUM CHLORIDE 0.9 % IV BOLUS
1000.0000 mL | Freq: Once | INTRAVENOUS | Status: AC
  Administered 2018-09-30: 1000 mL via INTRAVENOUS

## 2018-09-30 MED ORDER — ONDANSETRON HCL 4 MG/2ML IJ SOLN
4.0000 mg | Freq: Once | INTRAMUSCULAR | Status: AC
  Administered 2018-09-30: 4 mg via INTRAVENOUS
  Filled 2018-09-30: qty 2

## 2018-09-30 NOTE — ED Triage Notes (Signed)
Patient c/o palpitations with central chest pressure with SOB. A&o x4 in triage

## 2018-09-30 NOTE — ED Notes (Signed)
Pt ambulatory to use restroom with staff assistant, pt did give urine sample at this time and pt also had a BM. While this writer was in room with pt, pt did vomit a moderate amount into emesis bag. Writer helped clean pt up and walked pt back to bed. Pt is now resting in bed and back on monitor.

## 2018-09-30 NOTE — ED Notes (Signed)
Tolerated soda well. NP aware.

## 2018-09-30 NOTE — ED Notes (Signed)
Report given to Vanessa RN.

## 2018-09-30 NOTE — ED Notes (Signed)
NP at bedside to discuss pt results and plan of care.

## 2018-09-30 NOTE — ED Notes (Signed)
Pt given soda per NP request. Pt alert and states she is feeling much better. Asking for a [place] sandwich tray. Told pt we would be unable to provide that due to her GI symptoms but would start with clear liquids and see how she tolerates that. Verbalizes understanding.

## 2018-09-30 NOTE — Discharge Instructions (Signed)
Call tomorrow to schedule an appointment with cardiology.  Return to the ER for any symptom of concern.

## 2018-09-30 NOTE — ED Provider Notes (Signed)
[facility] Emergency Department Provider Note  ___________________________________________   None    (approximate)  I have reviewed the triage vital signs and the nursing notes.   HISTORY  Chief Complaint Palpitations   HPI Heather Choi is a 57 y.o. female who presents to the emergency department for treatment and evaluation of palpitations and tachycardia with chest pressure and dyspnea. Symptoms started at 4:45 this afternoon. She has had similar episodes in the past with the last being about 2 months ago. Prior to arrival, she took her Coreg.   Past Medical History:  Diagnosis Date  . Asthma   . Bell's palsy    fingers tingling in cold weather  . CHF (congestive heart failure) (HCC)   . COPD (chronic obstructive pulmonary disease) (HCC)   . Enlarged heart   . Hypertension   . Thyroid disease     Patient Active Problem List   Diagnosis Date Noted  . Pelvic mass 12/26/2017  . Heart failure (HCC) 10/31/2017  . Chest pain 11/11/2016    Past Surgical History:  Procedure Laterality Date  . ABDOMINAL HYSTERECTOMY    . CHOLECYSTECTOMY    . IMPLANTABLE CARDIOVERTER DEFIBRILLATOR IMPLANT    . laparoscopic bilateral oophorectomy with removal of adnexal mass and lysis of adhesions  11/20/2017  . LAPAROSCOPY     with removal of adnexal structure    Prior to Admission medications   Medication Sig Start Date End Date Taking? Authorizing Provider  albuterol (PROVENTIL HFA;VENTOLIN HFA) 108 (90 Base) MCG/ACT inhaler Inhale 2 puffs into the lungs every 6 (six) hours as needed for wheezing or shortness of breath. Patient not taking: Reported on 11/07/2017 10/31/17   Milagros Loll, MD  ALPRAZolam Prudy Feeler) 0.5 MG tablet Take 0.5 mg by mouth at bedtime as needed for anxiety (only takes as needed.).    [provider]  aspirin EC 81 MG tablet Take 1 tablet by mouth daily.    [provider]  carvedilol (COREG) 25 MG tablet Take 1 tablet  (25 mg total) by mouth daily. Consider holding if SBP < 100 or HR < 60 or feeling dizzi Patient taking differently: Take 25 mg by mouth 2 (two) times daily with a meal. Consider holding if SBP < 100 or HR < 60 or feeling dizzi 11/12/16   Delfino Lovett, MD  Digoxin 62.5 MCG TABS Take 0.0625 mg by mouth daily. 07/29/17 07/29/18  Nita Sickle, MD  furosemide (LASIX) 40 MG tablet Take 40 mg by mouth daily.  12/09/14   [provider]  levothyroxine (SYNTHROID, LEVOTHROID) 150 MCG tablet Take 1 tablet by mouth daily. Take 1 tablet every morning with water on an empty stomach 30-60 minutes before breakfast. 03/05/14   [provider]  lisinopril (PRINIVIL,ZESTRIL) 5 MG tablet Take 1 tablet (5 mg total) by mouth daily. 10/31/17 10/31/18  Milagros Loll, MD  potassium chloride SA (K-DUR,KLOR-CON) 20 MEQ tablet Take 1 tablet by mouth 2 (two) times daily. 04/29/15   [provider]  predniSONE (DELTASONE) 50 MG tablet Take 1 tablet (50 mg total) by mouth daily with breakfast. Patient not taking: Reported on 11/07/2017 10/31/17   Milagros Loll, MD  spironolactone (ALDACTONE) 25 MG tablet Take 25 mg by mouth daily.  04/29/15   [provider]    Allergies Amlodipine; Penicillins; and Hydrochlorothiazide  Family History  Problem Relation Age of Onset  . Heart failure Father   . Heart attack Mother     Social History Social History  Tobacco Use  . Smoking status: Current Every Day Smoker    Packs/day: 0.50    Types: Cigarettes  . Smokeless tobacco: Never Used  Substance Use Topics  . Alcohol use: Yes    Comment: rarely  . Drug use: No    Review of Systems  Constitutional: No fever/chills Eyes: No visual changes. ENT: No difficulty swallowing. Cardiovascular: Positive for chest pressure. Respiratory: Positive for shortness of breath. Gastrointestinal: No abdominal pain.  No nausea, no vomiting.  No diarrhea.  No constipation. Genitourinary: Negative for  dysuria. Musculoskeletal: Negative for back pain. Skin: Negative for rash. Neurological: Negative for headaches, focal weakness or numbness. ____________________________________________   PHYSICAL EXAM:  VITAL SIGNS: ED Triage Vitals  Enc Vitals Group     BP 09/30/18 1745 (!) 81/49     Pulse Rate 09/30/18 1745 (!) 169     Resp 09/30/18 1750 18     Temp 09/30/18 1745 98.2 F (36.8 C)     Temp Source 09/30/18 1745 Oral     SpO2 09/30/18 1745 96 %     Weight 09/30/18 1748 150 lb (68 kg)     Height 09/30/18 1748 5\' 2"  (1.575 m)     Head Circumference --      Peak Flow --      Pain Score 09/30/18 1750 10     Pain Loc --      Pain Edu? --      Excl. in GC? --     Constitutional: Alert and oriented. Well appearing and in no acute distress. Eyes: Conjunctivae are normal. Head: Atraumatic. Nose: No congestion/rhinnorhea. Mouth/Throat: Mucous membranes are moist.  Oropharynx non-erythematous. Neck: No stridor.   Cardiovascular: Tachycardic. Good peripheral circulation. Respiratory: Normal respiratory effort.  No retractions. Lungs CTAB. Gastrointestinal: Soft and nontender. No distention. No abdominal bruits. No CVA tenderness. Musculoskeletal: No lower extremity tenderness nor edema.  No joint effusions. Neurologic:  Normal speech and language. No gross focal neurologic deficits are appreciated. No gait instability. Skin:  Skin is warm, dry and intact. No rash noted. Psychiatric: Mood and affect are normal. Speech and behavior are normal.  ____________________________________________   LABS (all labs ordered are listed, but only abnormal results are displayed)  Labs Reviewed  BASIC METABOLIC PANEL - Abnormal; Notable for the following components:      Result Value   Glucose, Bld 245 (*)    Creatinine, Ser 1.16 (*)    GFR calc non Af Amer 53 (*)    All other components within normal limits  CBC - Abnormal; Notable for the following components:   Platelets 139 (*)    All  other components within normal limits  TROPONIN I - Abnormal; Notable for the following components:   Troponin I 0.05 (*)    All other components within normal limits  TROPONIN I - Abnormal; Notable for the following components:   Troponin I 0.04 (*)    All other components within normal limits  URINALYSIS, COMPLETE (UACMP) WITH MICROSCOPIC - Abnormal; Notable for the following components:   Color, Urine YELLOW (*)    APPearance CLOUDY (*)    Glucose, UA 150 (*)    Hgb urine dipstick SMALL (*)    Protein, ur 100 (*)    All other components within normal limits  URINE CULTURE  PROTIME-INR  HEPATIC FUNCTION PANEL  INFLUENZA PANEL BY PCR (TYPE A & B)   ____________________________________________  EKG  ED ECG REPORT I, Kem Boroughsari Miryam Mcelhinney, FNP-BC personally viewed and interpreted this  ECG.   Date: 09/30/2018  EKG Time: 1750  Rate: 164  Rhythm: SVT  Axis: normal  Intervals:none  ST&T Change: No ST elevation  ____________________________________________  RADIOLOGY  ED MD interpretation:  Cardiomegaly without evidence of acute cardiopulmonary disease.  Official radiology report(s): Dg Chest 2 View  Result Date: 09/30/2018 CLINICAL DATA:  Acute chest pain and shortness of breath today. EXAM: CHEST - 2 VIEW COMPARISON:  07/21/2018 and prior chest radiographs FINDINGS: Cardiomegaly and LEFT-sided pacemaker/ICD again noted. There is no evidence of focal airspace disease, pulmonary edema, suspicious pulmonary nodule/mass, pleural effusion, or pneumothorax. No acute bony abnormalities are identified. IMPRESSION: Cardiomegaly without evidence of acute cardiopulmonary disease. Electronically Signed   By: Harmon PierJeffrey  Hu M.D.   On: 09/30/2018 18:24    ____________________________________________   PROCEDURES  Procedure(s) performed: None  Procedures  Critical Care performed: No  ____________________________________________   INITIAL IMPRESSION / ASSESSMENT AND PLAN / ED  COURSE  As part of my medical decision making, I reviewed the following data within the electronic MEDICAL RECORD NUMBER Notes from prior ED visits   [age] year old female with a history of SVT presents with palpitations, chest pressure, and dyspnea. She was here in October for the same and required Adenosine. During initial exam, heart rate was 157 and began to gradually decrease without intervention. She states that she immediately had some relief of chest pressure and less short of breath.   ----------------------------------------- 6:51 PM on 09/30/2018 -----------------------------------------  Troponin noted to be elevated, but appears that it is chronic. She now complains of nausea. Blood pressure remains low, but she seems asymptomatic. NS bolus ordered.   Also at that her last ER visit, her pacemeaker was interrogated and showed 25 episodes of SVT.   She submitted an interrogation to Dr. Darrold JunkerParaschos on 09/12/18 but has not heard anything. It looks like she has an appointment on 12/20/18 for a procedure visit with him, but she isn't sure what it is for.   ----------------------------------------- 7:18 PM on 09/30/2018 -----------------------------------------  RN reports 2 episodes of vomiting and diarrhea. Patient states that she just doesn't feel well in general. Will add influenza test since it is currently prevalent in the community. Also plan to repeat her troponin in a couple of hours.  ----------------------------------------- 9:47 PM on 09/30/2018 -----------------------------------------  Patient feels better. No further episodes of vomiting or diarrhea. Troponin is lower than the initial. She is to call and schedule an appointment with cardiology and inquire as to what her appointment is for on 12/20/18. She agrees to the plan. She was advised to return to the ER for symptoms of concern.      ____________________________________________   FINAL CLINICAL IMPRESSION(S) / ED  DIAGNOSES  Final diagnoses:  SVT (supraventricular tachycardia) (HCC)  Vomiting and diarrhea     ED Discharge Orders    None       Note:  This document was prepared using Dragon voice recognition software and may include unintentional dictation errors.    Chinita Pesterriplett, Janique Hoefer B, FNP 09/30/18 2158    Jeanmarie PlantMcShane, James A, MD 09/30/18 [id number]

## 2018-09-30 NOTE — ED Notes (Signed)
Dr. Lenard LancePaduchowski aware of Troponin of 0.05.

## 2018-09-30 NOTE — ED Notes (Signed)
Patient had two episodes of vomiting and one episode of diarrhea. C.Triplett NP aware.

## 2018-10-02 LAB — URINE CULTURE

## 2019-01-20 ENCOUNTER — Emergency Department: Payer: BLUE CROSS/BLUE SHIELD

## 2019-01-20 ENCOUNTER — Other Ambulatory Visit: Payer: Self-pay

## 2019-01-20 ENCOUNTER — Inpatient Hospital Stay
Admission: EM | Admit: 2019-01-20 | Discharge: 2019-01-23 | DRG: 291 | Disposition: A | Discharge status: Home / Self Care | Payer: BLUE CROSS/BLUE SHIELD | Attending: Internal Medicine | Admitting: Internal Medicine

## 2019-01-20 DIAGNOSIS — J9601 Acute respiratory failure with hypoxia: Secondary | ICD-10-CM | POA: Diagnosis present

## 2019-01-20 DIAGNOSIS — I110 Hypertensive heart disease with heart failure: Principal | ICD-10-CM | POA: Diagnosis present

## 2019-01-20 DIAGNOSIS — R102 Pelvic and perineal pain: Secondary | ICD-10-CM

## 2019-01-20 DIAGNOSIS — Z90710 Acquired absence of both cervix and uterus: Secondary | ICD-10-CM

## 2019-01-20 DIAGNOSIS — I509 Heart failure, unspecified: Secondary | ICD-10-CM

## 2019-01-20 DIAGNOSIS — Z79899 Other long term (current) drug therapy: Secondary | ICD-10-CM | POA: Diagnosis not present

## 2019-01-20 DIAGNOSIS — Z95810 Presence of automatic (implantable) cardiac defibrillator: Secondary | ICD-10-CM

## 2019-01-20 DIAGNOSIS — Z79890 Hormone replacement therapy (postmenopausal): Secondary | ICD-10-CM

## 2019-01-20 DIAGNOSIS — J449 Chronic obstructive pulmonary disease, unspecified: Secondary | ICD-10-CM | POA: Diagnosis present

## 2019-01-20 DIAGNOSIS — I351 Nonrheumatic aortic (valve) insufficiency: Secondary | ICD-10-CM | POA: Diagnosis present

## 2019-01-20 DIAGNOSIS — I5023 Acute on chronic systolic (congestive) heart failure: Secondary | ICD-10-CM | POA: Diagnosis present

## 2019-01-20 DIAGNOSIS — J441 Chronic obstructive pulmonary disease with (acute) exacerbation: Secondary | ICD-10-CM | POA: Diagnosis present

## 2019-01-20 DIAGNOSIS — Z8249 Family history of ischemic heart disease and other diseases of the circulatory system: Secondary | ICD-10-CM

## 2019-01-20 DIAGNOSIS — Z20828 Contact with and (suspected) exposure to other viral communicable diseases: Secondary | ICD-10-CM | POA: Diagnosis present

## 2019-01-20 DIAGNOSIS — E039 Hypothyroidism, unspecified: Secondary | ICD-10-CM | POA: Diagnosis present

## 2019-01-20 DIAGNOSIS — F419 Anxiety disorder, unspecified: Secondary | ICD-10-CM | POA: Diagnosis present

## 2019-01-20 DIAGNOSIS — F17210 Nicotine dependence, cigarettes, uncomplicated: Secondary | ICD-10-CM | POA: Diagnosis present

## 2019-01-20 DIAGNOSIS — Z8543 Personal history of malignant neoplasm of ovary: Secondary | ICD-10-CM | POA: Diagnosis not present

## 2019-01-20 DIAGNOSIS — Z888 Allergy status to other drugs, medicaments and biological substances status: Secondary | ICD-10-CM | POA: Diagnosis not present

## 2019-01-20 DIAGNOSIS — I10 Essential (primary) hypertension: Secondary | ICD-10-CM | POA: Diagnosis present

## 2019-01-20 DIAGNOSIS — Z7982 Long term (current) use of aspirin: Secondary | ICD-10-CM | POA: Diagnosis not present

## 2019-01-20 DIAGNOSIS — Z880 Allergy status to penicillin: Secondary | ICD-10-CM | POA: Diagnosis not present

## 2019-01-20 DIAGNOSIS — R0602 Shortness of breath: Secondary | ICD-10-CM | POA: Diagnosis not present

## 2019-01-20 DIAGNOSIS — I5022 Chronic systolic (congestive) heart failure: Secondary | ICD-10-CM | POA: Diagnosis present

## 2019-01-20 LAB — CBC WITH DIFFERENTIAL/PLATELET
Abs Immature Granulocytes: 0.01 10*3/uL (ref 0.00–0.07)
Basophils Absolute: 0.1 10*3/uL (ref 0.0–0.1)
Basophils Relative: 1 %
Eosinophils Absolute: 0.3 10*3/uL (ref 0.0–0.5)
Eosinophils Relative: 4 %
HCT: 43.5 % (ref 36.0–46.0)
Hemoglobin: 13.8 g/dL (ref 12.0–15.0)
Immature Granulocytes: 0 %
Lymphocytes Relative: 24 %
Lymphs Abs: 1.6 10*3/uL (ref 0.7–4.0)
MCH: 27.7 pg (ref 26.0–34.0)
MCHC: 31.7 g/dL (ref 30.0–36.0)
MCV: 87.2 fL (ref 80.0–100.0)
Monocytes Absolute: 0.6 10*3/uL (ref 0.1–1.0)
Monocytes Relative: 9 %
Neutro Abs: 4.1 10*3/uL (ref 1.7–7.7)
Neutrophils Relative %: 62 %
Platelets: 125 10*3/uL — ABNORMAL LOW (ref 150–400)
RBC: 4.99 MIL/uL (ref 3.87–5.11)
RDW: 15 % (ref 11.5–15.5)
WBC: 6.6 10*3/uL (ref 4.0–10.5)
nRBC: 0 % (ref 0.0–0.2)

## 2019-01-20 LAB — COMPREHENSIVE METABOLIC PANEL
ALT: 9 U/L (ref 0–44)
AST: 15 U/L (ref 15–41)
Albumin: 3.9 g/dL (ref 3.5–5.0)
Alkaline Phosphatase: 75 U/L (ref 38–126)
Anion gap: 9 (ref 5–15)
BUN: 18 mg/dL (ref 6–20)
CO2: 28 mmol/L (ref 22–32)
Calcium: 9 mg/dL (ref 8.9–10.3)
Chloride: 105 mmol/L (ref 98–111)
Creatinine, Ser: 1.09 mg/dL — ABNORMAL HIGH (ref 0.44–1.00)
GFR calc Af Amer: >60 mL/min (ref >60)
GFR calc non Af Amer: 56 mL/min — ABNORMAL LOW (ref >60)
Glucose, Bld: 183 mg/dL — ABNORMAL HIGH (ref 70–99)
Potassium: 4 mmol/L (ref 3.5–5.1)
Sodium: 142 mmol/L (ref 135–145)
Total Bilirubin: 0.4 mg/dL (ref 0.3–1.2)
Total Protein: 7.1 g/dL (ref 6.5–8.1)

## 2019-01-20 LAB — LACTIC ACID, PLASMA
Lactic Acid, Venous: 2.3 mmol/L (ref 0.5–1.9)
Lactic Acid, Venous: 1.6 mmol/L (ref 0.5–1.9)

## 2019-01-20 LAB — BRAIN NATRIURETIC PEPTIDE: B Natriuretic Peptide: 207 pg/mL — ABNORMAL HIGH (ref 0.0–100.0)

## 2019-01-20 LAB — SARS CORONAVIRUS 2 BY RT PCR (HOSPITAL ORDER, PERFORMED IN ~~LOC~~ HOSPITAL LAB): SARS Coronavirus 2: NEGATIVE

## 2019-01-20 LAB — TROPONIN I: Troponin I: 0.03 ng/mL (ref <0.03)

## 2019-01-20 MED ORDER — FUROSEMIDE 10 MG/ML IJ SOLN
40.0000 mg | Freq: Once | INTRAMUSCULAR | Status: AC
  Administered 2019-01-20: 20:00:00 40 mg via INTRAVENOUS
  Filled 2019-01-20: qty 4

## 2019-01-20 MED ORDER — ASPIRIN EC 81 MG PO TBEC
81.0000 mg | DELAYED_RELEASE_TABLET | Freq: Every day | ORAL | Status: DC
  Administered 2019-01-21 – 2019-01-23 (×3): 81 mg via ORAL
  Filled 2019-01-20 (×3): qty 1

## 2019-01-20 MED ORDER — NICOTINE 7 MG/24HR TD PT24
7.0000 mg | MEDICATED_PATCH | Freq: Every day | TRANSDERMAL | Status: DC
  Administered 2019-01-20 – 2019-01-23 (×4): 7 mg via TRANSDERMAL
  Filled 2019-01-20 (×5): qty 1

## 2019-01-20 MED ORDER — METHYLPREDNISOLONE SODIUM SUCC 125 MG IJ SOLR
125.0000 mg | Freq: Once | INTRAMUSCULAR | Status: AC
  Administered 2019-01-20: 20:00:00 125 mg via INTRAVENOUS
  Filled 2019-01-20: qty 2

## 2019-01-20 MED ORDER — ACETAMINOPHEN 325 MG PO TABS: 650.0000 mg | ORAL_TABLET | Freq: Four times a day (QID) | ORAL | Status: DC | PRN

## 2019-01-20 MED ORDER — FUROSEMIDE 10 MG/ML IJ SOLN
20.0000 mg | Freq: Once | INTRAMUSCULAR | Status: AC
  Administered 2019-01-20: 20 mg via INTRAVENOUS
  Filled 2019-01-20: qty 2

## 2019-01-20 MED ORDER — ONDANSETRON HCL 4 MG/2ML IJ SOLN: 4.0000 mg | Freq: Four times a day (QID) | INTRAMUSCULAR | Status: DC | PRN

## 2019-01-20 MED ORDER — ACETAMINOPHEN 650 MG RE SUPP: 650.0000 mg | Freq: Four times a day (QID) | RECTAL | Status: DC | PRN

## 2019-01-20 MED ORDER — DOXYCYCLINE HYCLATE 100 MG PO TABS
100.0000 mg | ORAL_TABLET | Freq: Two times a day (BID) | ORAL | Status: DC
  Administered 2019-01-21 – 2019-01-23 (×5): 100 mg via ORAL
  Filled 2019-01-20 (×5): qty 1

## 2019-01-20 MED ORDER — AZITHROMYCIN 500 MG PO TABS
500.0000 mg | ORAL_TABLET | Freq: Once | ORAL | Status: AC
  Administered 2019-01-20: 500 mg via ORAL
  Filled 2019-01-20: qty 1

## 2019-01-20 MED ORDER — FUROSEMIDE 40 MG PO TABS: 40.0000 mg | ORAL_TABLET | Freq: Two times a day (BID) | ORAL | Status: DC

## 2019-01-20 MED ORDER — SODIUM CHLORIDE 0.9 % IV SOLN
1.0000 g | Freq: Once | INTRAVENOUS | Status: AC
  Administered 2019-01-20: 1 g via INTRAVENOUS
  Filled 2019-01-20: qty 10

## 2019-01-20 MED ORDER — DIGOXIN 125 MCG PO TABS
0.0625 mg | ORAL_TABLET | Freq: Every day | ORAL | Status: DC
  Administered 2019-01-21 – 2019-01-23 (×3): 0.0625 mg via ORAL
  Filled 2019-01-20 (×3): qty 0.5

## 2019-01-20 MED ORDER — ONDANSETRON HCL 4 MG PO TABS
4.0000 mg | ORAL_TABLET | Freq: Four times a day (QID) | ORAL | Status: DC | PRN
  Administered 2019-01-22: 4 mg via ORAL
  Filled 2019-01-20: qty 1

## 2019-01-20 MED ORDER — ENOXAPARIN SODIUM 40 MG/0.4ML ~~LOC~~ SOLN
40.0000 mg | SUBCUTANEOUS | Status: DC
  Administered 2019-01-20 – 2019-01-22 (×3): 40 mg via SUBCUTANEOUS
  Filled 2019-01-20 (×3): qty 0.4

## 2019-01-20 MED ORDER — LEVOTHYROXINE SODIUM 50 MCG PO TABS
150.0000 ug | ORAL_TABLET | Freq: Every day | ORAL | Status: DC
  Administered 2019-01-21 – 2019-01-22 (×2): 150 ug via ORAL
  Filled 2019-01-20 (×2): qty 1

## 2019-01-20 MED ORDER — ALBUTEROL SULFATE HFA 108 (90 BASE) MCG/ACT IN AERS
6.0000 | INHALATION_SPRAY | RESPIRATORY_TRACT | Status: DC | PRN
  Administered 2019-01-20: 20:00:00 6 via RESPIRATORY_TRACT
  Filled 2019-01-20: qty 6.7

## 2019-01-20 MED ORDER — ALPRAZOLAM 0.5 MG PO TABS
0.5000 mg | ORAL_TABLET | Freq: Every evening | ORAL | Status: DC | PRN
  Administered 2019-01-22: 0.5 mg via ORAL
  Filled 2019-01-20: qty 1

## 2019-01-20 MED ORDER — IPRATROPIUM-ALBUTEROL 20-100 MCG/ACT IN AERS
1.0000 | INHALATION_SPRAY | Freq: Four times a day (QID) | RESPIRATORY_TRACT | Status: DC | PRN
  Filled 2019-01-20: qty 4

## 2019-01-20 NOTE — H&P (Signed)
[facility] [place] at [facility]   PATIENT NAME: Heather Choi    MR#:  [id number]  DATE OF BIRTH:  July 09, 1961  DATE OF ADMISSION:  01/20/2019  PRIMARY CARE PHYSICIAN: Kandyce Rud, MD   REQUESTING/REFERRING PHYSICIAN: Don Perking, MD  CHIEF COMPLAINT:   Chief Complaint  Patient presents with  . Shortness of Breath    HISTORY OF PRESENT ILLNESS:  Heather Choi  is a 58 y.o. female who presents with chief complaint as above.  Patient presents the ED with a complaint of shortness of breath.  She states that she has had lower extremity edema today, more than usual.  She has a known history of significant heart failure with an EF around 25%.  She also has a history of COPD.  She denies any fever, chills, cough, nausea or vomiting, diarrhea.  Work-up here in the ED is largely consistent with CHF exacerbation, with significant edema on chest x-ray.  However, patient does have a mildly elevated lactic acid as well.  Hospitalist were called for admission  PAST MEDICAL HISTORY:   Past Medical History:  Diagnosis Date  . Asthma   . Bell's palsy    fingers tingling in cold weather  . CHF (congestive heart failure) (HCC)   . COPD (chronic obstructive pulmonary disease) (HCC)   . Enlarged heart   . Hypertension   . Thyroid disease      PAST SURGICAL HISTORY:   Past Surgical History:  Procedure Laterality Date  . ABDOMINAL HYSTERECTOMY    . CHOLECYSTECTOMY    . IMPLANTABLE CARDIOVERTER DEFIBRILLATOR IMPLANT    . laparoscopic bilateral oophorectomy with removal of adnexal mass and lysis of adhesions  11/20/2017  . LAPAROSCOPY     with removal of adnexal structure     SOCIAL HISTORY:   Social History   Tobacco Use  . Smoking status: Current Every Day Smoker    Packs/day: 0.50    Types: Cigarettes  . Smokeless tobacco: Never Used  Substance Use Topics  . Alcohol use: Yes    Comment: rarely     FAMILY HISTORY:   Family History  Problem  Relation Age of Onset  . Heart failure Father   . Heart attack Mother      DRUG ALLERGIES:   Allergies  Allergen Reactions  . Amlodipine Other (See Comments) and Itching  . Penicillins Other (See Comments)    Has patient had a PCN reaction causing immediate rash, facial/tongue/throat swelling, SOB or lightheadedness with hypotension: Yes Has patient had a PCN reaction causing severe rash involving mucus membranes or skin necrosis: No Has patient had a PCN reaction that required hospitalization: No Has patient had a PCN reaction occurring within the last 10 years: Yes If all of the above answers are "NO", then may proceed with Cephalosporin use.  Marland Kitchen Hydrochlorothiazide Rash    MEDICATIONS AT HOME:   Prior to Admission medications   Medication Sig Start Date End Date Taking? Authorizing Provider  albuterol (PROVENTIL HFA;VENTOLIN HFA) 108 (90 Base) MCG/ACT inhaler Inhale 2 puffs into the lungs every 6 (six) hours as needed for wheezing or shortness of breath. Patient not taking: Reported on 11/07/2017 10/31/17   Milagros Loll, MD  ALPRAZolam Prudy Feeler) 0.5 MG tablet Take 0.5 mg by mouth at bedtime as needed for anxiety (only takes as needed.).    [provider]  aspirin EC 81 MG tablet Take 1 tablet by mouth daily.    [provider]  carvedilol (COREG) 25 MG  tablet Take 1 tablet (25 mg total) by mouth daily. Consider holding if SBP < 100 or HR < 60 or feeling dizzi Patient taking differently: Take 25 mg by mouth 2 (two) times daily with a meal. Consider holding if SBP < 100 or HR < 60 or feeling dizzi 11/12/16   Delfino Lovett, MD  Digoxin 62.5 MCG TABS Take 0.0625 mg by mouth daily. 07/29/17 07/29/18  Nita Sickle, MD  furosemide (LASIX) 40 MG tablet Take 40 mg by mouth daily.  12/09/14   [provider]  levothyroxine (SYNTHROID, LEVOTHROID) 150 MCG tablet Take 1 tablet by mouth daily. Take 1 tablet every morning with water on an empty stomach 30-60 minutes  before breakfast. 03/05/14   [provider]  lisinopril (PRINIVIL,ZESTRIL) 5 MG tablet Take 1 tablet (5 mg total) by mouth daily. 10/31/17 10/31/18  Milagros Loll, MD  potassium chloride SA (K-DUR,KLOR-CON) 20 MEQ tablet Take 1 tablet by mouth 2 (two) times daily. 04/29/15   [provider]  predniSONE (DELTASONE) 50 MG tablet Take 1 tablet (50 mg total) by mouth daily with breakfast. Patient not taking: Reported on 11/07/2017 10/31/17   Milagros Loll, MD  spironolactone (ALDACTONE) 25 MG tablet Take 25 mg by mouth daily.  04/29/15   [provider]    REVIEW OF SYSTEMS:  Review of Systems  Constitutional: Negative for chills, fever, malaise/fatigue and weight loss.  HENT: Negative for ear pain, hearing loss and tinnitus.   Eyes: Negative for blurred vision, double vision, pain and redness.  Respiratory: Positive for shortness of breath. Negative for cough and hemoptysis.   Cardiovascular: Positive for leg swelling. Negative for chest pain, palpitations and orthopnea.  Gastrointestinal: Negative for abdominal pain, constipation, diarrhea, nausea and vomiting.  Genitourinary: Negative for dysuria, frequency and hematuria.  Musculoskeletal: Negative for back pain, joint pain and neck pain.  Skin:       No acne, rash, or lesions  Neurological: Negative for dizziness, tremors, focal weakness and weakness.  Endo/Heme/Allergies: Negative for polydipsia. Does not bruise/bleed easily.  Psychiatric/Behavioral: Negative for depression. The patient is not nervous/anxious and does not have insomnia.      VITAL SIGNS:   Vitals:   01/20/19 2013 01/20/19 2108 01/20/19 2130 01/20/19 2207  BP: (!) 160/76 116/74 108/74 132/82  Pulse:  78 77 78  Resp:  15 (!) 23 16  Temp:      TempSrc:      SpO2:  96% 95% 95%  Weight:      Height:       Wt Readings from Last 3 Encounters:  01/20/19 74.8 kg  09/30/18 68 kg  07/21/18 67.1 kg    PHYSICAL EXAMINATION:  Physical Exam   Vitals reviewed. Constitutional: She is oriented to person, place, and time. She appears well-developed and well-nourished. No distress.  HENT:  Head: Normocephalic and atraumatic.  Mouth/Throat: Oropharynx is clear and moist.  Eyes: Pupils are equal, round, and reactive to light. Conjunctivae and EOM are normal. No scleral icterus.  Neck: Normal range of motion. Neck supple. No JVD present. No thyromegaly present.  Cardiovascular: Normal rate, regular rhythm and intact distal pulses. Exam reveals no gallop and no friction rub.  No murmur heard. Respiratory: Effort normal. No respiratory distress. She has no wheezes. She has rales.  GI: Soft. Bowel sounds are normal. She exhibits no distension. There is no abdominal tenderness.  Musculoskeletal: Normal range of motion.        General: Edema present.  Comments: No arthritis, no gout  Lymphadenopathy:    She has no cervical adenopathy.  Neurological: She is alert and oriented to person, place, and time. No cranial nerve deficit.  No dysarthria, no aphasia  Skin: Skin is warm and dry. No rash noted. No erythema.  Psychiatric: She has a normal mood and affect. Her behavior is normal. Judgment and thought content normal.    LABORATORY PANEL:   CBC Recent Labs  Lab 01/20/19 2009  WBC 6.6  HGB 13.8  HCT 43.5  PLT 125*   ------------------------------------------------------------------------------------------------------------------  Chemistries  Recent Labs  Lab 01/20/19 2009  NA 142  K 4.0  CL 105  CO2 28  GLUCOSE 183*  BUN 18  CREATININE 1.09*  CALCIUM 9.0  AST 15  ALT 9  ALKPHOS 75  BILITOT 0.4   ------------------------------------------------------------------------------------------------------------------  Cardiac Enzymes Recent Labs  Lab 01/20/19 2009  TROPONINI 0.03*   ------------------------------------------------------------------------------------------------------------------  RADIOLOGY:   Dg Chest Port 1 View  Result Date: 01/20/2019 CLINICAL DATA:  Shortness of breath, cough EXAM: PORTABLE CHEST 1 VIEW COMPARISON:  09/30/2018 FINDINGS: Left AICD remains in place, unchanged. Cardiomegaly with mild vascular congestion. No confluent opacities or effusions. No overt edema. No acute bony abnormality. IMPRESSION: Cardiomegaly, vascular congestion. Electronically Signed   By: Charlett Nose M.D.   On: 01/20/2019 20:54    EKG:   Orders placed or performed during the hospital encounter of 01/20/19  . ED EKG 12-Lead  . ED EKG 12-Lead  . EKG 12-Lead  . EKG 12-Lead    IMPRESSION AND PLAN:  Principal Problem:   Acute on chronic systolic CHF (congestive heart failure) (HCC) -IV Lasix given in the ED, will give another dose later on tonight.  Continue home meds, repeat chest x-ray in the a.m.  Get a cardiology consult.  ED physician gave antibiotics initially in the ED given the patient's elevated lactic acid, with the thought that her pulmonary edema may be hiding some underlying infection.  Hopefully repeat chest x-ray will have less edema and will show if there is any underlying findings.  Patient's white blood cell count was normal, with a normal differential, I will defer decision to continue antibiotics until tomorrow Active Problems:   COPD (chronic obstructive pulmonary disease) (HCC) -continue home dose inhalers.  Patient was given Solu-Medrol in the ED, but I will not continue this at this time given that she is not wheezing.  This could be restarted if COPD seems to be more of a driving factor in her shortness of breath, though I feel at this time it is likely more driven by her heart failure.   HTN (hypertension) -home dose antihypertensives   Hypothyroidism -home dose thyroid replacement   Anxiety -home dose anxiolytic  Chart review performed and case discussed with ED provider. Labs, imaging and/or ECG reviewed by provider and discussed with patient/family. Management plans  discussed with the patient and/or family.  DVT PROPHYLAXIS: SubQ lovenox   GI PROPHYLAXIS:  None  ADMISSION STATUS: Inpatient     CODE STATUS: Full Code Status History    Date Active Date Inactive Code Status Order ID Comments User Context   10/30/2017 0240 10/31/2017 1828 Full Code [id number]  Arnaldo Natal, MD ED   11/11/2016 1632 11/12/2016 1622 Full Code [id number]  Ramonita Lab, MD Inpatient      TOTAL TIME TAKING CARE OF THIS PATIENT: 45 minutes.   Barney Drain 01/20/2019, 10:23 PM  [facility]  Office  [phone]  CC: Primary  care physician; Kandyce RudBabaoff, Marcus, MD  Note:  This document was prepared using Dragon voice recognition software and may include unintentional dictation errors.

## 2019-01-20 NOTE — ED Notes (Signed)
Date and time results received: 01/20/19 10:18 PM  (use smartphrase ".now" to insert current time)  Test: Troponin Critical Value: 0.03  Name of Provider Notified: Dr. Don Perking  Orders Received? Or Actions Taken?: Orders Received - See Orders for details

## 2019-01-20 NOTE — ED Notes (Signed)
Pt assisted to bathroom at this time. Pt with steady gait. Pt in NAD at this time. Pt assisted back to bed. This RN will continue to monitor.

## 2019-01-20 NOTE — ED Notes (Signed)
.. ED TO INPATIENT HANDOFF REPORT  ED Nurse Name and Phone #: Pattricia Bossnnie [phone]  [phone]  S Name/Age/Gender Heather Choi 58 y.o. female Room/Bed: ED11A/ED11A  Code Status   Code Status: Prior  Home/SNF/Other Home Patient oriented to: self, place, time and situation Is this baseline? Yes   Triage Complete: Triage complete  Chief Complaint poss COVID  Triage Note Pt presents from home via acems with c/o coughing, fever, and vomiting that began today. Pt has hx of COPD and CHF. Pt was 88% on room air upon ems arrival. Pt was placed on 8L non-rebreather with oxygen saturation at 99%. Pt switched to 2L nasal cannula upon arrival to ER and currently saturating 99%. Pt is current employee at [employer]. Pt has pacemaker and defibrillator. 20 G placed by ems in route.    Allergies Allergies  Allergen Reactions  . Amlodipine Other (See Comments) and Itching  . Penicillins Other (See Comments)    Has patient had a PCN reaction causing immediate rash, facial/tongue/throat swelling, SOB or lightheadedness with hypotension: Yes Has patient had a PCN reaction causing severe rash involving mucus membranes or skin necrosis: No Has patient had a PCN reaction that required hospitalization: No Has patient had a PCN reaction occurring within the last 10 years: Yes If all of the above answers are "NO", then may proceed with Cephalosporin use.  Marland Kitchen. Hydrochlorothiazide Rash    Level of Care/Admitting Diagnosis ED Disposition    ED Disposition Condition Comment   Admit  The patient appears reasonably stabilized for admission considering the current resources, flow, and capabilities available in the ED at this time, and I doubt any other [facility] requiring further screening and/or treatment in the ED prior to admission is  present.       B Medical/Surgery History Past Medical History:  Diagnosis Date  . Asthma   . Bell's palsy    fingers tingling in cold weather  . CHF (congestive heart failure) (HCC)   .  COPD (chronic obstructive pulmonary disease) (HCC)   . Enlarged heart   . Hypertension   . Thyroid disease    Past Surgical History:  Procedure Laterality Date  . ABDOMINAL HYSTERECTOMY    . CHOLECYSTECTOMY    . IMPLANTABLE CARDIOVERTER DEFIBRILLATOR IMPLANT    . laparoscopic bilateral oophorectomy with removal of adnexal mass and lysis of adhesions  11/20/2017  . LAPAROSCOPY     with removal of adnexal structure     A IV Location/Drains/Wounds Patient Lines/Drains/Airways Status   Active Line/Drains/Airways    Name:   Placement date:   Placement time:   Site:   Days:   Peripheral IV 01/20/19 Left Antecubital   01/20/19    2023    Antecubital   less than 1   Peripheral IV 01/20/19 Right Antecubital   01/20/19    2109    Antecubital   less than 1          Intake/Output Last 24 hours No intake or output data in the 24 hours ending 01/20/19 2155  Labs/Imaging Results for orders placed or performed during the hospital encounter of 01/20/19 (from the past 48 hour(s))  Lactic acid, plasma     Status: Abnormal   Collection Time: 01/20/19  8:09 PM  Result Value Ref Range   Lactic Acid, Venous 2.3 (HH) 0.5 - 1.9 mmol/L    Comment: CRITICAL RESULT CALLED TO, READ BACK BY AND VERIFIED WITH Bronsen Serano AT 2047 01/20/2019 SMA Performed at [facility], [address] [place], [place] [address]   CBC WITH DIFFERENTIAL     Status: Abnormal   Collection Time: 01/20/19  8:09 PM  Result Value Ref Range   WBC 6.6 4.0 - 10.5 K/uL   RBC 4.99 3.87 - 5.11 MIL/uL   Hemoglobin 13.8 12.0 - 15.0 g/dL   HCT 07.6 22.6 - 33.3 %   MCV 87.2 80.0 - 100.0 fL   MCH 27.7 26.0 - 34.0 pg   MCHC 31.7 30.0 - 36.0 g/dL   RDW 54.5 62.5 - 63.8 %   Platelets 125 (L) 150 - 400 K/uL   nRBC 0.0 0.0 - 0.2 %   Neutrophils Relative % 62 %   Neutro Abs 4.1 1.7 - 7.7 K/uL   Lymphocytes Relative 24 %   Lymphs Abs 1.6 0.7 - 4.0 K/uL   Monocytes Relative 9 %   Monocytes Absolute 0.6 0.1 - 1.0 K/uL    Eosinophils Relative 4 %   Eosinophils Absolute 0.3 0.0 - 0.5 K/uL   Basophils Relative 1 %   Basophils Absolute 0.1 0.0 - 0.1 K/uL   Immature Granulocytes 0 %   Abs Immature Granulocytes 0.01 0.00 - 0.07 K/uL    Comment: Performed at [facility], [address] [place], [place] [address]  Comprehensive metabolic panel     Status: Abnormal   Collection Time: 01/20/19  8:09 PM  Result Value Ref Range   Sodium 142 135 - 145 mmol/L   Potassium 4.0 3.5 - 5.1 mmol/L   Chloride 105 98 - 111 mmol/L   CO2 28 22 - 32 mmol/L   Glucose, Bld 183 (H) 70 - 99 mg/dL   BUN 18 6 - 20 mg/dL   Creatinine, Ser 2.87 (H) 0.44 - 1.00 mg/dL   Calcium 9.0 8.9 - 68.1 mg/dL   Total Protein 7.1 6.5 - 8.1 g/dL   Albumin 3.9 3.5 - 5.0 g/dL   AST 15 15 - 41 U/L   ALT 9 0 - 44 U/L   Alkaline Phosphatase 75 38 - 126 U/L   Total Bilirubin 0.4 0.3 - 1.2 mg/dL   GFR calc non Af Amer 56 (L) >60 mL/min   GFR calc Af Amer >60 >60 mL/min   Anion gap 9 5 - 15    Comment: Performed at [facility], [address] [place], [place] [address]  SARS Coronavirus 2 [facility] order, Performed in [facility] hospital lab)     Status: None   Collection Time: 01/20/19  8:10 PM  Result Value Ref Range   SARS Coronavirus 2 NEGATIVE NEGATIVE    Comment: (NOTE) If result is NEGATIVE SARS-CoV-2 target nucleic acids are NOT DETECTED. The SARS-CoV-2 RNA is generally detectable in upper and lower  respiratory specimens during the acute phase of infection. The lowest  concentration of SARS-CoV-2 viral copies this assay can detect is 250  copies / mL. A negative result does not preclude SARS-CoV-2 infection  and should not be used as the sole basis for treatment or other  patient management decisions.  A negative result may occur with  improper specimen collection / handling, submission of specimen other  than nasopharyngeal swab, presence of viral mutation(s) within the  areas targeted by this assay, and  inadequate number of viral copies  (<250 copies / mL). A negative result must be combined with clinical  observations, patient history, and epidemiological information. If result is POSITIVE SARS-CoV-2 target nucleic acids are DETECTED. The SARS-CoV-2 RNA is generally detectable in upper and  lower  respiratory specimens dur ing the acute phase of infection.  Positive  results are indicative of active infection with SARS-CoV-2.  Clinical  correlation with patient history and other diagnostic information is  necessary to determine patient infection status.  Positive results do  not rule out bacterial infection or co-infection with other viruses. If result is PRESUMPTIVE POSTIVE SARS-CoV-2 nucleic acids MAY BE PRESENT.   A presumptive positive result was obtained on the submitted specimen  and confirmed on repeat testing.  While 2019 novel coronavirus  (SARS-CoV-2) nucleic acids may be present in the submitted sample  additional confirmatory testing may be necessary for epidemiological  and / or clinical management purposes  to differentiate between  SARS-CoV-2 and other Sarbecovirus currently known to infect humans.  If clinically indicated additional testing with an alternate test  methodology [phone]) is advised. The SARS-CoV-2 RNA is generally  detectable in upper and lower respiratory sp ecimens during the acute  phase of infection. The expected result is Negative. Fact Sheet for Patients:  BoilerBrush.com.cy Fact Sheet for Healthcare Providers: https://pope.com/ This test is not yet approved or cleared by the [place] FDA and has been authorized for detection and/or diagnosis of SARS-CoV-2 by FDA under an Emergency Use Authorization (EUA).  This EUA will remain in effect (meaning this test can be used) for the duration of the COVID-19 declaration under Section 564(b)(1) of the Act, 21 U.S.C. section 360bbb-3(b)(1), unless the  authorization is terminated or revoked sooner. Performed at [facility], [address] [place], [place] [address]    Dg Chest [place] View 1 View  Result Date: 01/20/2019 CLINICAL DATA:  Shortness of breath, cough EXAM: PORTABLE CHEST 1 VIEW COMPARISON:  09/30/2018 FINDINGS: Left AICD remains in place, unchanged. Cardiomegaly with mild vascular congestion. No confluent opacities or effusions. No overt edema. No acute bony abnormality. IMPRESSION: Cardiomegaly, vascular congestion. Electronically Signed   By: Charlett Nose M.D.   On: 01/20/2019 20:54    Pending Labs Unresulted Labs (From admission, onward)    Start     Ordered   01/20/19 2130  Brain natriuretic peptide  Add-on,   AD     01/20/19 2129   01/20/19 2130  Troponin I - Add-On to previous collection  Add-on,   AD     01/20/19 2129   01/20/19 2051  HIV antibody (Routine Testing)  Once,   STAT     01/20/19 2050   01/20/19 2009  Lactic acid, plasma  Now then every 2 hours,   STAT     01/20/19 2008   01/20/19 2009  Blood Culture (routine x 2)  BLOOD CULTURE X 2,   STAT     01/20/19 2008          Vitals/Pain Today's Vitals   01/20/19 2013 01/20/19 2108 01/20/19 2130 01/20/19 2154  BP: (!) 160/76 116/74 108/74   Pulse:  78 77   Resp:  15 (!) 23   Temp:      TempSrc:      SpO2:  96% 95%   Weight:      Height:      PainSc:    0-No pain    Isolation Precautions Droplet and Contact precautions  Medications Medications  albuterol (VENTOLIN HFA) 108 (90 Base) MCG/ACT inhaler 6 puff (6 puffs Inhalation Given 01/20/19 2022)  cefTRIAXone (ROCEPHIN) 1 g in sodium chloride 0.9 % 100 mL IVPB (1 g Intravenous New Bag/Given 01/20/19 2150)  methylPREDNISolone sodium succinate (SOLU-MEDROL) 125 mg/2 mL injection  125 mg (125 mg Intravenous Given 01/20/19 2021)  furosemide (LASIX) injection 40 mg (40 mg Intravenous Given 01/20/19 2021)  azithromycin (ZITHROMAX) tablet 500 mg (500 mg Oral Given 01/20/19 2149)     Mobility walks Low fall risk   Focused Assessments Pulmonary Assessment Handoff:  Lung sounds: Bilateral Breath Sounds: Expiratory wheezes O2 Device: Room Air        R Recommendations: See Admitting Provider Note  Report given to:   Additional Notes:  25% Ejection fraction.

## 2019-01-20 NOTE — ED Notes (Signed)
Admitting doctor at bedside 

## 2019-01-20 NOTE — ED Notes (Signed)
Date and time results received: 01/20/19 9:17 PM (use smartphrase ".now" to insert current time)  Test: Lactic Acid Critical Value: 2.3  Name of Provider Notified: Dr. Don Perking  Orders Received? Or Actions Taken?: Orders Received - See Orders for details

## 2019-01-20 NOTE — ED Provider Notes (Signed)
[facility] Emergency Department Provider Note  ____________________________________________  Time seen: Approximately 8:16 PM  I have reviewed the triage vital signs and the nursing notes.   HISTORY  Chief Complaint Shortness of Breath   HPI Heather Choi is a 58 y.o. female with a history of asthma, COPD, smoking, systolic CHF with EF 25-30% s/p AICD who presents for evaluation of shortness of breath.  Patient reports that she started to have a low-grade fever today.  Has a cough that is productive.  Developed progressively worsening shortness of breath during the day.  No chest pain.  Has been using her inhaler.  Continues to smoke.  No personal family history of blood clots, recent travel immobilization, leg pain or swelling, hemoptysis, or exogenous hormones.  Patient works at [employer].  She denies any known exposure to COVID patients or travel to endemic regions.  Initially patient was hypoxic to the upper 80s when EMS arrived.  She was transported on CPAP.  Upon arrival to the emergency room patient was weaned to 2 L nasal cannula.   Past Medical History:  Diagnosis Date   Asthma    Bell's palsy    fingers tingling in cold weather   CHF (congestive heart failure) (HCC)    COPD (chronic obstructive pulmonary disease) (HCC)    Enlarged heart    Hypertension    Thyroid disease     Patient Active Problem List   Diagnosis Date Noted   Acute on chronic systolic CHF (congestive heart failure) (HCC) 01/20/2019   COPD (chronic obstructive pulmonary disease) (HCC) 01/20/2019   HTN (hypertension) 01/20/2019   Hypothyroidism 01/20/2019   Anxiety 01/20/2019   Pelvic mass 12/26/2017   Heart failure (HCC) 10/31/2017   Chest pain 11/11/2016    Past Surgical History:  Procedure Laterality Date   ABDOMINAL HYSTERECTOMY     CHOLECYSTECTOMY     IMPLANTABLE CARDIOVERTER DEFIBRILLATOR IMPLANT     laparoscopic bilateral oophorectomy with  removal of adnexal mass and lysis of adhesions  11/20/2017   LAPAROSCOPY     with removal of adnexal structure    Prior to Admission medications   Medication Sig Start Date End Date Taking? Authorizing Provider  albuterol (PROVENTIL HFA;VENTOLIN HFA) 108 (90 Base) MCG/ACT inhaler Inhale 2 puffs into the lungs every 6 (six) hours as needed for wheezing or shortness of breath. Patient not taking: Reported on 11/07/2017 10/31/17   Milagros Loll, MD  ALPRAZolam Prudy Feeler) 0.5 MG tablet Take 0.5 mg by mouth at bedtime as needed for anxiety (only takes as needed.).    [provider]  aspirin EC 81 MG tablet Take 1 tablet by mouth daily.    [provider]  carvedilol (COREG) 25 MG tablet Take 1 tablet (25 mg total) by mouth daily. Consider holding if SBP < 100 or HR < 60 or feeling dizzi Patient taking differently: Take 25 mg by mouth 2 (two) times daily with a meal. Consider holding if SBP < 100 or HR < 60 or feeling dizzi 11/12/16   Delfino Lovett, MD  Digoxin 62.5 MCG TABS Take 0.0625 mg by mouth daily. 07/29/17 07/29/18  Nita Sickle, MD  furosemide (LASIX) 40 MG tablet Take 40 mg by mouth daily.  12/09/14   [provider]  levothyroxine (SYNTHROID, LEVOTHROID) 150 MCG tablet Take 1 tablet by mouth daily. Take 1 tablet every morning with water on an empty stomach 30-60 minutes before breakfast. 03/05/14   [provider]  lisinopril (PRINIVIL,ZESTRIL) 5 MG  tablet Take 1 tablet (5 mg total) by mouth daily. 10/31/17 10/31/18  Milagros LollSudini, Srikar, MD  potassium chloride SA (K-DUR,KLOR-CON) 20 MEQ tablet Take 1 tablet by mouth 2 (two) times daily. 04/29/15   [provider]  predniSONE (DELTASONE) 50 MG tablet Take 1 tablet (50 mg total) by mouth daily with breakfast. Patient not taking: Reported on 11/07/2017 10/31/17   Milagros LollSudini, Srikar, MD  spironolactone (ALDACTONE) 25 MG tablet Take 25 mg by mouth daily.  04/29/15   [provider]    Allergies Amlodipine;  Penicillins; and Hydrochlorothiazide  Family History  Problem Relation Age of Onset   Heart failure Father    Heart attack Mother     Social History Social History   Tobacco Use   Smoking status: Current Every Day Smoker    Packs/day: 0.50    Types: Cigarettes   Smokeless tobacco: Never Used  Substance Use Topics   Alcohol use: Yes    Comment: rarely   Drug use: No    Review of Systems  Constitutional: + fever. Eyes: Negative for visual changes. ENT: Negative for sore throat. Neck: No neck pain  Cardiovascular: Negative for chest pain. Respiratory: + shortness of breath, cough Gastrointestinal: Negative for abdominal pain, vomiting or diarrhea. Genitourinary: Negative for dysuria. Musculoskeletal: Negative for back pain. Skin: Negative for rash. Neurological: Negative for headaches, weakness or numbness. Psych: No SI or HI  ____________________________________________   PHYSICAL EXAM:  VITAL SIGNS: ED Triage Vitals  Enc Vitals Group     BP 01/20/19 2013 (!) 160/76     Pulse Rate 01/20/19 2011 85     Resp 01/20/19 2011 20     Temp 01/20/19 2011 98.3 F (36.8 C)     Temp Source 01/20/19 2011 Oral     SpO2 01/20/19 2011 100 %     Weight 01/20/19 2012 165 lb (74.8 kg)     Height 01/20/19 2012 5\' 5"  (1.651 m)     Head Circumference --      Peak Flow --      Pain Score 01/20/19 2012 0     Pain Loc --      Pain Edu? --      Excl. in GC? --     Constitutional: Alert and oriented, mild increased WOB.  HEENT:      Head: Normocephalic and atraumatic.         Eyes: Conjunctivae are normal. Sclera is non-icteric.       Mouth/Throat: Mucous membranes are moist.       Neck: Supple with no signs of meningismus. Cardiovascular: Regular rate and rhythm. No murmurs, gallops, or rubs. 2+ symmetrical distal pulses are present in all extremities. No JVD. Respiratory: Mild increased work of breathing, satting 100% on room air, slightly decreased air movement  bilaterally with faint expiratory wheezes. Gastrointestinal: Soft, non tender, and non distended with positive bowel sounds. No rebound or guarding. Musculoskeletal: Nontender with normal range of motion in all extremities. No edema, cyanosis, or erythema of extremities. Neurologic: Normal speech and language. Face is symmetric. Moving all extremities. No gross focal neurologic deficits are appreciated. Skin: Skin is warm, dry and intact. No rash noted. Psychiatric: Mood and affect are normal. Speech and behavior are normal.  ____________________________________________   LABS (all labs ordered are listed, but only abnormal results are displayed)  Labs Reviewed  LACTIC ACID, PLASMA - Abnormal; Notable for the following components:      Result Value   Lactic Acid, Venous 2.3 (*)  All other components within normal limits  CBC WITH DIFFERENTIAL/PLATELET - Abnormal; Notable for the following components:   Platelets 125 (*)    All other components within normal limits  COMPREHENSIVE METABOLIC PANEL - Abnormal; Notable for the following components:   Glucose, Bld 183 (*)    Creatinine, Ser 1.09 (*)    GFR calc non Af Amer 56 (*)    All other components within normal limits  BRAIN NATRIURETIC PEPTIDE - Abnormal; Notable for the following components:   B Natriuretic Peptide 207.0 (*)    All other components within normal limits  SARS CORONAVIRUS 2 (HOSPITAL ORDER, PERFORMED IN [place] [facility])  CULTURE, BLOOD (ROUTINE X 2)  CULTURE, BLOOD (ROUTINE X 2)  LACTIC ACID, PLASMA  HIV ANTIBODY (ROUTINE TESTING W REFLEX)  TROPONIN I   ____________________________________________  EKG  ED ECG REPORT I, Nita Sickle, the attending physician, personally viewed and interpreted this ECG.  Normal sinus rhythm, rate of 81, normal intervals, normal axis, LVH, T wave inversions in lateral leads, no ST elevation, occasional PVCs.  No significant changes when compared to  prior. ____________________________________________  RADIOLOGY  I have personally reviewed the images performed during this visit and I agree with the Radiologist's read.   Interpretation by Radiologist:  Dg Chest Port 1 View  Result Date: 01/20/2019 CLINICAL DATA:  Shortness of breath, cough EXAM: PORTABLE CHEST 1 VIEW COMPARISON:  09/30/2018 FINDINGS: Left AICD remains in place, unchanged. Cardiomegaly with mild vascular congestion. No confluent opacities or effusions. No overt edema. No acute bony abnormality. IMPRESSION: Cardiomegaly, vascular congestion. Electronically Signed   By: Charlett Nose M.D.   On: 01/20/2019 20:54      ____________________________________________   PROCEDURES  Procedure(s) performed: None Procedures Critical Care performed: yes  CRITICAL CARE Performed by: Nita Sickle  ?  Total critical care time: 35 min  Critical care time was exclusive of separately billable procedures and treating other patients.  Critical care was necessary to treat or prevent imminent or life-threatening deterioration.  Critical care was time spent personally by me on the following activities: development of treatment plan with patient and/or surrogate as well as nursing, discussions with consultants, evaluation of patient's response to treatment, examination of patient, obtaining history from patient or surrogate, ordering and performing treatments and interventions, ordering and review of laboratory studies, ordering and review of radiographic studies, pulse oximetry and re-evaluation of patient's condition.  ____________________________________________   INITIAL IMPRESSION / ASSESSMENT AND PLAN / ED COURSE  58 y.o. female with a history of asthma, COPD, smoking, systolic CHF with EF 25-30% s/p AICD who presents for evaluation of productive shortness of breath, subjective fever, and cough. Patient afebrile. Has not taken any anti-pyretics at home.   Patient  arrives with mildly increased work of breathing, satting 100% on room air with slight decreased air movement bilaterally and faint expiratory wheezes.  Differential diagnoses including COPD exacerbation, CHF exacerbation, pneumonia, viral illness, COVID.  Patient refusing COVID testing.  Will do chest x-ray to eval for pneumonia or pulmonary edema.  Will treat with Solu-Medrol, albuterol inhaler, IV Lasix.  Labs are pending.  Clinical Course as of Jan 19 2217  Mon Jan 20, 2019  2203 Chest x-ray concerning for mild pulmonary edema.  Patient was given IV Lasix.  Borderline lactic acidosis of 2.3 with no leukocytosis however since patient reports fever at home I will cover for possible pneumonia with Rocephin and azithromycin.  COVID-19 swab was negative.  Discussed with Dr. Anne Hahn for  admission.   [CV]    Clinical Course User Index [CV] Don Perking [place], MD     As part of my medical decision making, I reviewed the following data within the electronic MEDICAL RECORD NUMBER Nursing notes reviewed and incorporated, Labs reviewed , EKG interpreted , Old EKG reviewed, Old chart reviewed, Radiograph reviewed , Discussed with admitting physician , Notes from prior ED visits and [place] Controlled Substance Database    Pertinent labs & imaging results that were available during my care of the patient were reviewed by me and considered in my medical decision making (see chart for details).    ____________________________________________   FINAL CLINICAL IMPRESSION(S) / ED DIAGNOSES  Final diagnoses:  Acute respiratory failure with hypoxia (HCC)  Acute on chronic congestive heart failure, unspecified heart failure type (HCC)  COPD exacerbation (HCC)      NEW MEDICATIONS STARTED DURING THIS VISIT:  ED Discharge Orders    None       Note:  This document was prepared using Dragon voice recognition software and may include unintentional dictation errors.    Don Perking, [place], MD 01/20/19  2220

## 2019-01-20 NOTE — ED Triage Notes (Signed)
Pt presents from home via [facility] with c/o coughing, fever, and vomiting that began today. Pt has hx of COPD and CHF. Pt was 88% on room air upon ems arrival. Pt was placed on 8L non-rebreather with oxygen saturation at 99%. Pt switched to 2L nasal cannula upon arrival to ER and currently saturating 99%. Pt is current employee at [employer]. Pt has pacemaker and defibrillator. 20 G placed by ems in route.

## 2019-01-21 ENCOUNTER — Inpatient Hospital Stay: Payer: BLUE CROSS/BLUE SHIELD

## 2019-01-21 LAB — CBC
HCT: 45.6 % (ref 36.0–46.0)
Hemoglobin: 14.6 g/dL (ref 12.0–15.0)
MCH: 27.3 pg (ref 26.0–34.0)
MCHC: 32 g/dL (ref 30.0–36.0)
MCV: 85.4 fL (ref 80.0–100.0)
Platelets: 136 10*3/uL — ABNORMAL LOW (ref 150–400)
RBC: 5.34 MIL/uL — ABNORMAL HIGH (ref 3.87–5.11)
RDW: 14.9 % (ref 11.5–15.5)
WBC: 6.6 10*3/uL (ref 4.0–10.5)
nRBC: 0 % (ref 0.0–0.2)

## 2019-01-21 LAB — BASIC METABOLIC PANEL
Anion gap: 10 (ref 5–15)
BUN: 21 mg/dL — ABNORMAL HIGH (ref 6–20)
CO2: 24 mmol/L (ref 22–32)
Calcium: 9.6 mg/dL (ref 8.9–10.3)
Chloride: 105 mmol/L (ref 98–111)
Creatinine, Ser: 1.02 mg/dL — ABNORMAL HIGH (ref 0.44–1.00)
GFR calc Af Amer: >60 mL/min (ref >60)
GFR calc non Af Amer: >60 mL/min (ref >60)
Glucose, Bld: 190 mg/dL — ABNORMAL HIGH (ref 70–99)
Potassium: 4.1 mmol/L (ref 3.5–5.1)
Sodium: 139 mmol/L (ref 135–145)

## 2019-01-21 MED ORDER — FUROSEMIDE 10 MG/ML IJ SOLN
60.0000 mg | Freq: Two times a day (BID) | INTRAMUSCULAR | Status: AC
  Administered 2019-01-21 – 2019-01-22 (×4): 60 mg via INTRAVENOUS
  Filled 2019-01-21 (×4): qty 6

## 2019-01-21 NOTE — Progress Notes (Signed)
[facility] [facility] [place] [facility]   PATIENT NAME: Heather Choi    MR#:  [id number]  DATE OF BIRTH:  04/06/1961  SUBJECTIVE:  CHIEF COMPLAINT:   Chief Complaint  Patient presents with  . Shortness of Breath   Patient continues to have shortness of breath.  Diuresing well. Feels her abdomen is distended.  REVIEW OF SYSTEMS:    Review of Systems  Constitutional: Negative for chills and fever.  HENT: Negative for sore throat.   Eyes: Negative for blurred vision, double vision and pain.  Respiratory: Positive for shortness of breath. Negative for cough, hemoptysis and wheezing.   Cardiovascular: Positive for orthopnea and leg swelling. Negative for chest pain and palpitations.  Gastrointestinal: Negative for abdominal pain, constipation, diarrhea, heartburn, nausea and vomiting.  Genitourinary: Negative for dysuria and hematuria.  Musculoskeletal: Negative for back pain and joint pain.  Skin: Negative for rash.  Neurological: Negative for sensory change, speech change, focal weakness and headaches.  Endo/Heme/Allergies: Does not bruise/bleed easily.  Psychiatric/Behavioral: Negative for depression. The patient is not nervous/anxious.     DRUG ALLERGIES:   Allergies  Allergen Reactions  . Amlodipine Other (See Comments) and Itching  . Penicillins Other (See Comments)    Has patient had a PCN reaction causing immediate rash, facial/tongue/throat swelling, SOB or lightheadedness with hypotension: Yes Has patient had a PCN reaction causing severe rash involving mucus membranes or skin necrosis: No Has patient had a PCN reaction that required hospitalization: No Has patient had a PCN reaction occurring within the last 10 years: Yes If all of the above answers are "NO", then may proceed with Cephalosporin use.  Marland Kitchen. Hydrochlorothiazide Rash    VITALS:  Blood pressure (!) 144/77, pulse 72, temperature 97.9 F (36.6 C), temperature source Oral, resp. rate 19, height  5\' 1"  (1.549 m), weight 69.6 kg, SpO2 93 %.  PHYSICAL EXAMINATION:   Physical Exam  GENERAL:  58 y.o.-year-old patient lying in the bed with no acute distress.  EYES: Pupils equal, round, reactive to light and accommodation. No scleral icterus. Extraocular muscles intact.  HEENT: Head atraumatic, normocephalic. Oropharynx and nasopharynx clear.  NECK:  Supple, no jugular venous distention. No thyroid enlargement, no tenderness.  LUNGS: Normal breath sounds bilaterally, no wheezing, rales, rhonchi. No use of accessory muscles of respiration.  CARDIOVASCULAR: S1, S2 normal. No murmurs, rubs, or gallops.  ABDOMEN: Soft, nontender, nondistended. Bowel sounds present. No organomegaly or mass.  EXTREMITIES: No cyanosis, clubbing or edema b/l.    NEUROLOGIC: Cranial nerves II through XII are intact. No focal Motor or sensory deficits b/l.   PSYCHIATRIC: The patient is alert and oriented x 3.  SKIN: No obvious rash, lesion, or ulcer.   LABORATORY PANEL:   CBC Recent Labs  Lab 01/21/19 0425  WBC 6.6  HGB 14.6  HCT 45.6  PLT 136*   ------------------------------------------------------------------------------------------------------------------ Chemistries  Recent Labs  Lab 01/20/19 2009 01/21/19 0425  NA 142 139  K 4.0 4.1  CL 105 105  CO2 28 24  GLUCOSE 183* 190*  BUN 18 21*  CREATININE 1.09* 1.02*  CALCIUM 9.0 9.6  AST 15  --   ALT 9  --   ALKPHOS 75  --   BILITOT 0.4  --    ------------------------------------------------------------------------------------------------------------------  Cardiac Enzymes Recent Labs  Lab 01/20/19 2009  TROPONINI 0.03*   ------------------------------------------------------------------------------------------------------------------  RADIOLOGY:  [place] Pelvis (transabdominal Only)  Result Date: 01/21/2019 CLINICAL DATA:  58 year old female with pelvic pain and fullness status post abdominal  hysterectomy, and then a laparoscopic  bilateral oophorectomy in February 2019 for removal of a cystic adnexal mass. EXAM: TRANSABDOMINAL ULTRASOUND OF PELVIS TECHNIQUE: Transabdominal ultrasound examination of the pelvis was performed including evaluation of the uterus, ovaries, adnexal regions, and pelvic cul-de-sac. COMPARISON:  Pre for ectomy CT Abdomen and Pelvis 10/31/2017. FINDINGS: Uterus Measurements: Surgically absent. Endometrium Thickness: Surgically absent. Right ovary Measurements: Surgically absent. Left ovary Measurements: Surgically absent. Other findings: No pelvic free fluid. No pelvic mass or abnormality is evident by ultrasound. Diminutive urinary bladder partially visible. IMPRESSION: Negative ultrasound appearance of the pelvis after hysterectomy and bilateral oophorectomy. Electronically Signed   By: Odessa Fleming M.D.   On: 01/21/2019 08:40   Dg Chest Port 1 View  Result Date: 01/21/2019 CLINICAL DATA:  Congestive failure EXAM: PORTABLE CHEST 1 VIEW COMPARISON:  01/20/2019 FINDINGS: Cardiac shadow is again enlarged. Defibrillator is again noted. Lungs are well aerated bilaterally. Mild interstitial changes are seen stable from the previous day. No sizable effusion is noted. No focal confluent infiltrate is noted. IMPRESSION: Stable changes of vascular congestion and interstitial edema. No new focal abnormality is noted. Electronically Signed   By: Alcide Clever M.D.   On: 01/21/2019 08:04   Dg Chest Port 1 View  Result Date: 01/20/2019 CLINICAL DATA:  Shortness of breath, cough EXAM: PORTABLE CHEST 1 VIEW COMPARISON:  09/30/2018 FINDINGS: Left AICD remains in place, unchanged. Cardiomegaly with mild vascular congestion. No confluent opacities or effusions. No overt edema. No acute bony abnormality. IMPRESSION: Cardiomegaly, vascular congestion. Electronically Signed   By: Charlett Nose M.D.   On: 01/20/2019 20:54     ASSESSMENT AND PLAN:   *  Acute on chronic systolic CHF (congestive heart failure)  She has gained 15  pounds in the last 2 months. - IV Lasix, Beta blockers - Input and Output - Counseled to limit fluids and Salt - Monitor Bun/Cr and Potassium - Echo-reordered and pending -Cardiology follow up after discharge  Patient still has significant fluid overload and will need further work-up.  Abdominal distention due to ascites from CHF.  Patient had history of ovarian cancer/cyst and had hysterectomy pelvic ultrasound ordered on admission shows nothing acute.   * COPD (chronic obstructive pulmonary disease)  No wheezing.  Nebulizers as needed   * HTN  -home dose antihypertensives   * Hypothyroidism -home dose thyroid replacement   * Anxiety -home dose anxiolytic  All the records are reviewed and case discussed with Care Management/Social Workerr. Management plans discussed with the patient, family and they are in agreement.  CODE STATUS: Full code  DVT Prophylaxis: SCDs  TOTAL TIME TAKING CARE OF THIS PATIENT: 30 minutes.   POSSIBLE D/C IN 1-2 DAYS, DEPENDING ON CLINICAL CONDITION.  Molinda Bailiff Lev Cervone M.D on 01/21/2019 at 1:03 PM  Between 7am to 6pm - Pager - [phone]  After 6pm go to [email or web address] - password [email or web address] [facility]  [facility] [place] [facility]  Office  [phone]  CC: Primary care physician; Kandyce Rud, MD  Note: This dictation was prepared with Dragon dictation along with smaller phrase technology. Any transcriptional errors that result from this process are unintentional.

## 2019-01-21 NOTE — Progress Notes (Signed)
Inpatient Diabetes Program Recommendations  AACE/ADA: New Consensus Statement on Inpatient Glycemic Control   Target Ranges:  Prepandial:   less than 140 mg/dL      Peak postprandial:   less than 180 mg/dL (1-2 hours)      Critically ill patients:  140 - 180 mg/dL  Results for DESTINII, GRIEGER (MRN [id number]) as of 01/21/2019 09:43  Ref. Range 01/20/2019 20:09 01/21/2019 04:25  Glucose Latest Ref Range: 70 - 99 mg/dL 071 (H) 219 (H)   Results for CORREN, GAINEY (MRN [id number]) as of 01/21/2019 09:43  Ref. Range 10/30/2017 02:48  Hemoglobin A1C Latest Ref Range: 4.8 - 5.6 % 6.0 (H)   Review of Glycemic Control  Diabetes history: NO Outpatient Diabetes medications: NA Current orders for Inpatient glycemic control: None  Inpatient Diabetes Program Recommendations:   Correction (SSI): Please consider ordering CBGs ACHS with Novolog 0-9 units TID with meals and Novolog 0-5 units QHS.  HbgA1C: Please consider ordering an A1C to evaluate glycemic control over the past 2-3 months.  NOTE: Initial glucose 183 mg/d at 20:09 on 01/20/19. Patient received one time Solumedrol 125 mg at 20:21 on 01/20/19.   Thanks, Orlando Penner, RN, MSN, CDE Diabetes Coordinator Inpatient Diabetes Program [phone] (Team Pager from 8am to 5pm)

## 2019-01-21 NOTE — Plan of Care (Signed)
  Problem: Clinical Measurements: Goal: Respiratory complications will improve Outcome: Progressing Note:  On room air.   Problem: Activity: Goal: Risk for activity intolerance will decrease Outcome: Progressing Note:  Up independently in room   Problem: Coping: Goal: Level of anxiety will decrease Outcome: Progressing   Problem: Elimination: Goal: Will not experience complications related to urinary retention Outcome: Progressing   Problem: Pain Managment: Goal: General experience of comfort will improve Outcome: Progressing Note:  No complaints of pain this shift   Problem: Safety: Goal: Ability to remain free from injury will improve Outcome: Progressing   Problem: Cardiac: Goal: Ability to achieve and maintain adequate cardiopulmonary perfusion will improve Outcome: Progressing Note:  Receiving IV lasix   Problem: Education: Goal: Knowledge of General Education information will improve Description Including pain rating scale, medication(s)/side effects and non-pharmacologic comfort measures Outcome: Completed/Met   Problem: Nutrition: Goal: Adequate nutrition will be maintained Outcome: Completed/Met

## 2019-01-21 NOTE — TOC Initial Note (Signed)
Transition of Care [facility](TOC) - Initial/Assessment Note    Patient Details  Name: Heather Choi MRN: [id number] Date of Birth: 06/15/1961  Transition of Care [facility](TOC) CM/SW Contact:    Sherren KernsJennifer L Majed Pellegrin, RN Phone Number: 01/21/2019, 10:46 AM  Clinical Narrative:     Patient is from home, independent.  Her disabled nephew lives with her and her niece.  She has been admitted with acute on chronic CHF.   She has access to a scale and is able to get one.  Current with PCP. [facility]  She states she has had difficulty in the past affording her Albuterol.  Pending benefits check on price.  Not current with the heart failure clinic and does not wish to have an appointment.  She states she is too busy taking care of her nephew.  Will let patient know how much her albuterol will cost once benefits check is complete.              Expected Discharge Plan: Home/Self Care Barriers to Discharge: Continued Medical Work up   Patient Goals and CMS Choice        Expected Discharge Plan and Services Expected Discharge Plan: Home/Self Care   Discharge Planning Services: CM Consult, HF Clinic   Living arrangements for the past 2 months: Single Family Home                          Prior Living Arrangements/Services Living arrangements for the past 2 months: Single Family Home Lives with:: Other (Comment)(Her niece and nephew live with her) Patient language and need for interpreter reviewed:: Yes Do you feel safe going back to the place where you live?: Yes            Criminal Activity/Legal Involvement Pertinent to Current Situation/Hospitalization: No - Comment as needed  Activities of Daily Living Home Assistive Devices/Equipment: Eyeglasses ADL Screening (condition at time of admission) Patient's cognitive ability adequate to safely complete daily activities?: Yes Is the patient deaf or have difficulty hearing?: No Does the patient have difficulty  seeing, even when wearing glasses/contacts?: No Does the patient have difficulty concentrating, remembering, or making decisions?: No Patient able to express need for assistance with ADLs?: Yes Does the patient have difficulty dressing or bathing?: No Independently performs ADLs?: Yes (appropriate for developmental age) Does the patient have difficulty walking or climbing stairs?: Yes Weakness of Legs: Both Weakness of Arms/Hands: Both  Permission Sought/Granted                  Emotional Assessment Appearance:: Appears stated age Attitude/Demeanor/Rapport: Gracious Affect (typically observed): Accepting Orientation: : Oriented to Self, Oriented to Place, Oriented to  Time, Oriented to Situation Alcohol / Substance Use: Not Applicable    Admission diagnosis:  COPD exacerbation (HCC) [J44.1] Acute respiratory failure with hypoxia (HCC) [J96.01] Acute on chronic congestive heart failure, unspecified heart failure type [facility](HCC) [I50.9] Patient Active Problem List   Diagnosis Date Noted  . Acute on chronic systolic CHF (congestive heart failure) (HCC) 01/20/2019  . COPD (chronic obstructive pulmonary disease) (HCC) 01/20/2019  . HTN (hypertension) 01/20/2019  . Hypothyroidism 01/20/2019  . Anxiety 01/20/2019  . Pelvic mass 12/26/2017  . Heart failure (HCC) 10/31/2017  . Chest pain 11/11/2016   PCP:  Kandyce RudBabaoff, Marcus, MD Pharmacy:   [facility] [address] - [place] [address] [place] [address] Phone: [phone] Fax:  [phone]     Social Determinants of Health (SDOH) Interventions    Readmission Risk Interventions No flowsheet data found.

## 2019-01-21 NOTE — TOC Benefit Eligibility Note (Addendum)
Transition of Care [facility]) Benefit Eligibility Note    Patient Details  Name: Heather Choi MRN: [id number] Date of Birth: 11-04-1960   Medication/Dose: Albuterol (Ventolin HFA) 108 (90 base) MCT/ACT Inhaler - 6 puffs every hour PRN  Covered?: Yes  Tier: 2 Drug  Prescription Coverage [facility]: [facility]   Spoke with Person/Company/Phone Number:: Ginger C. with [employer]: [phone]  Co-Pay: $50 estimated copay for Ventolin HFA for 30 day supply, mail order estimated cost is $52.51. Numbers quoted based on 18 package count.  Prior Approval: No(No PA for Ventolin, required for Albuterol.)  Deductible: (Representive unable to relay deductible information.)  Additional Notes: Ventolin shows as preferred medication, where as albuterol displaying as  non-formulary.  Albuterol would require PA.  Number for PA: [phone].    Heather Choi Phone Number: [phone] or [phone] 01/21/2019, 11:54 AM

## 2019-01-21 NOTE — Progress Notes (Signed)
Pt states she has had a hysterectomy in the past and had a fluid filled cyst removed as well, pt says this feels the same as when that happened and wanted the doctor to know, relayed information to Dr. Anne Hahn, he ordered a pelvic US. I asked pt if her abdomen was tender at all and she pointed more towards her sternum. Will continue to monitor. Shirley Friar, RN, BSN

## 2019-01-21 NOTE — Consult Note (Addendum)
[facility]  CARDIOLOGY CONSULT NOTE  Patient ID: Heather PersiaCarolyn W Amara MRN: [id number] DOB/AGE: 57/05/1961 57 y.o.  Admit date: 01/20/2019 Referring Physician Oralia ManisWillis, David, MD  Primary Physician Corrinne EagleBaboff, Marcus, MD Primary Cardiologist Jamse MeadParaschos, Alex, MD Reason for Consultation Acute on chronic systolic heart failure  HPI:  Heather PersiaCarolyn W Williamsen is a 58 y.o. 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 Patient has a 1 month history of intermittent cough treated with prednisone and antibiotics without significant improvement of her symptoms. Yesterday morning she noticed worse shortness of breath, which prompted her to call EMS. She was initially hypoxic to 88-89% on EMS arrival, however, O2 sats improved upon arrival to the ED. Patient underwent COVID testing in the ER, which was negative. CXR was consistent with mild pulmonary edema, so she received IV lasix. She was also empirically given antibiotics in the ED. Her shortness of breath is now improved and leg swelling she had endorsed yesterday is now resolved. She denies any chest pain, palpitations, lightheadedness, or syncope.   She has known normal coronary anatomy by her previous cardiac catheterization in 2015.   Review of systems complete and found to be negative unless listed above   Past Medical History:  Diagnosis Date  . Asthma   . Bell's palsy    fingers tingling in cold weather  . CHF (congestive heart failure) (HCC)   . COPD (chronic obstructive pulmonary disease) (HCC)   . Enlarged heart   . Hypertension   . Thyroid disease     Past Surgical History:  Procedure Laterality Date  . ABDOMINAL HYSTERECTOMY    . CHOLECYSTECTOMY    . IMPLANTABLE CARDIOVERTER DEFIBRILLATOR IMPLANT    . laparoscopic  bilateral oophorectomy with removal of adnexal mass and lysis of adhesions  11/20/2017  . LAPAROSCOPY     with removal of adnexal structure    Medications Prior to Admission  Medication Sig Dispense Refill Last Dose  . albuterol (PROVENTIL HFA;VENTOLIN HFA) 108 (90 Base) MCG/ACT inhaler Inhale 2 puffs into the lungs every 6 (six) hours as needed for wheezing or shortness of breath. (Patient not taking: Reported on 11/07/2017) 1 Inhaler 2 Not Taking  . ALPRAZolam (XANAX) 0.5 MG tablet Take 0.5 mg by mouth at bedtime as needed for anxiety (only takes as needed.).   Taking  . aspirin EC 81 MG tablet Take 1 tablet by mouth daily.   Taking  . carvedilol (COREG) 25 MG tablet Take 1 tablet (25 mg total) by mouth daily. Consider holding if SBP < 100 or HR < 60 or feeling dizzi (Patient taking differently: Take 25 mg by mouth 2 (two) times daily with a meal. Consider holding if SBP < 100 or HR < 60 or feeling dizzi) 30 tablet 0 Taking  . Digoxin 62.5 MCG TABS Take 0.0625 mg by mouth daily. 30 tablet 0 Taking  . furosemide (LASIX) 40 MG tablet Take 40 mg by mouth daily.    Taking  . levothyroxine (SYNTHROID, LEVOTHROID) 150 MCG tablet Take 1 tablet by mouth daily. Take 1 tablet every morning with water on an empty stomach 30-60 minutes before breakfast.   Taking  . lisinopril (PRINIVIL,ZESTRIL) 5 MG tablet Take 1 tablet (5 mg total) by mouth daily. 30 tablet 0 Taking  . potassium chloride SA (K-DUR,KLOR-CON) 20 MEQ  tablet Take 1 tablet by mouth 2 (two) times daily.   Taking  . predniSONE (DELTASONE) 50 MG tablet Take 1 tablet (50 mg total) by mouth daily with breakfast. (Patient not taking: Reported on 11/07/2017) 4 tablet 0 Not Taking  . spironolactone (ALDACTONE) 25 MG tablet Take 25 mg by mouth daily.    Taking   Social History   Socioeconomic History  . Marital status: Single    Spouse name: Not on file  . Number of children: Not on file  . Years of education: Not on file  . Highest education level:  Not on file  Occupational History  . Not on file  Social Needs  . Financial resource strain: Not on file  . Food insecurity:    Worry: Not on file    Inability: Not on file  . Transportation needs:    Medical: Not on file    Non-medical: Not on file  Tobacco Use  . Smoking status: Current Every Day Smoker    Packs/day: 0.50    Types: Cigarettes  . Smokeless tobacco: Never Used  Substance and Sexual Activity  . Alcohol use: Yes    Comment: rarely  . Drug use: No  . Sexual activity: Not on file  Lifestyle  . Physical activity:    Days per week: Not on file    Minutes per session: Not on file  . Stress: Not on file  Relationships  . Social connections:    Talks on phone: Not on file    Gets together: Not on file    Attends religious service: Not on file    Active member of club or organization: Not on file    Attends meetings of clubs or organizations: Not on file    Relationship status: Not on file  . Intimate partner violence:    Fear of current or ex partner: Not on file    Emotionally abused: Not on file    Physically abused: Not on file    Forced sexual activity: Not on file  Other Topics Concern  . Not on file  Social History Narrative  . Not on file    Family History  Problem Relation Age of Onset  . Heart failure Father   . Heart attack Mother     Review of systems complete and found to be negative unless listed above    PHYSICAL EXAM   General: Well developed, well nourished, in no acute distress HEENT:  Normocephalic and atramatic Neck:  No JVD.  Lungs: Clear bilaterally to auscultation and percussion. No wheezing appreciated.  Heart: HRRR . Normal S1 and S2 without gallops or murmurs.  Extremities: No clubbing, cyanosis or edema.   Neuro: Alert and oriented X 3. Psych:  Good affect, responds appropriately  Labs:   Lab Results  Component Value Date   WBC 6.6 01/21/2019   HGB 14.6 01/21/2019   HCT 45.6 01/21/2019   MCV 85.4 01/21/2019   PLT  136 (L) 01/21/2019    Recent Labs  Lab 01/20/19 2009 01/21/19 0425  NA 142 139  K 4.0 4.1  CL 105 105  CO2 28 24  BUN 18 21*  CREATININE 1.09* 1.02*  CALCIUM 9.0 9.6  PROT 7.1  --   BILITOT 0.4  --   ALKPHOS 75  --   ALT 9  --   AST 15  --   GLUCOSE 183* 190*   Lab Results  Component Value Date   CKTOTAL 107 10/05/2013  TROPONINI 0.03 (HH) 01/20/2019    Lab Results  Component Value Date   CHOL 113 11/12/2016   Lab Results  Component Value Date   HDL 40 (L) 11/12/2016   Lab Results  Component Value Date   LDLCALC 60 11/12/2016   Lab Results  Component Value Date   TRIG 66 11/12/2016   Lab Results  Component Value Date   CHOLHDL 2.8 11/12/2016   No results found for: LDLDIRECT    Radiology: US Pelvis (transabdominal Only)  Result Date: 01/21/2019 CLINICAL DATA:  58 year old female with pelvic pain and fullness status post abdominal hysterectomy, and then a laparoscopic bilateral oophorectomy in February 2019 for removal of a cystic adnexal mass. EXAM: TRANSABDOMINAL ULTRASOUND OF PELVIS TECHNIQUE: Transabdominal ultrasound examination of the pelvis was performed including evaluation of the uterus, ovaries, adnexal regions, and pelvic cul-de-sac. COMPARISON:  Pre for ectomy CT Abdomen and Pelvis 10/31/2017. FINDINGS: Uterus Measurements: Surgically absent. Endometrium Thickness: Surgically absent. Right ovary Measurements: Surgically absent. Left ovary Measurements: Surgically absent. Other findings: No pelvic free fluid. No pelvic mass or abnormality is evident by ultrasound. Diminutive urinary bladder partially visible. IMPRESSION: Negative ultrasound appearance of the pelvis after hysterectomy and bilateral oophorectomy. Electronically Signed   By: Odessa Fleming M.D.   On: 01/21/2019 08:40   Dg Chest Port 1 View  Result Date: 01/21/2019 CLINICAL DATA:  Congestive failure EXAM: PORTABLE CHEST 1 VIEW COMPARISON:  01/20/2019 FINDINGS: Cardiac shadow is again enlarged.  Defibrillator is again noted. Lungs are well aerated bilaterally. Mild interstitial changes are seen stable from the previous day. No sizable effusion is noted. No focal confluent infiltrate is noted. IMPRESSION: Stable changes of vascular congestion and interstitial edema. No new focal abnormality is noted. Electronically Signed   By: Alcide Clever M.D.   On: 01/21/2019 08:04   Dg Chest Port 1 View  Result Date: 01/20/2019 CLINICAL DATA:  Shortness of breath, cough EXAM: PORTABLE CHEST 1 VIEW COMPARISON:  09/30/2018 FINDINGS: Left AICD remains in place, unchanged. Cardiomegaly with mild vascular congestion. No confluent opacities or effusions. No overt edema. No acute bony abnormality. IMPRESSION: Cardiomegaly, vascular congestion. Electronically Signed   By: Charlett Nose M.D.   On: 01/20/2019 20:54    Telemetry review: Sinus rhythm, Rate of 74 BPM, No significant ST-T wave changes  ASSESSMENT AND PLAN:  Ms. Hober is a 58 y.o. 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 Patient presented to the ED last night with signs and symptoms of fluid overload. CXR consistent with mild pulmonary edema. Troponin initially 0.04 and then 0.03 on repeat. Likely troponin leak secondary to CHF exacerbation. She received empiric antibiotic treatment in the ED with azithyromycin and Rocephin. She also received one IV dose of solumedrol given mild wheezes initially heard on exam during ED presentation. Renal function stable.  Plan:  1. Continue diuresis with IV lasix while inpatient. Recommend increasing home dose of PO lasix to 80 mg daily at time of discharge 2. Continue appropriate medical management of systolic CHF with beta blocker, ACE, aldosterone antagonist, and loop diuretic 3. No evidence of MI. Troponin increased likely secondary to demand  ischemia from CHF. No further cardiac diagnostics indicated at this time.  4. Encourage ambulation. If patient ambulating and feeling well, patient would be appropriate for discharge to home.  5. Follow up with outpatient cardiologist Dr. Darrold Junker in one  week from discharge - will consider repeat ECHO in the outpatient setting.   The patient's history and exam findings were discussed with Dr. Gwen Pounds. The plan was made in conjunction with Dr. Gwen Pounds.  Signed: Harrell Gave PA-C 01/21/2019, 8:55 AM

## 2019-01-22 ENCOUNTER — Inpatient Hospital Stay
Admit: 2019-01-22 | Discharge: 2019-01-22 | Disposition: A | Discharge status: Home / Self Care | Payer: BLUE CROSS/BLUE SHIELD | Attending: Internal Medicine | Admitting: Internal Medicine

## 2019-01-22 DIAGNOSIS — I5043 Acute on chronic combined systolic (congestive) and diastolic (congestive) heart failure: Secondary | ICD-10-CM

## 2019-01-22 LAB — BASIC METABOLIC PANEL
Anion gap: 11 (ref 5–15)
BUN: 33 mg/dL — ABNORMAL HIGH (ref 6–20)
CO2: 24 mmol/L (ref 22–32)
Calcium: 9.2 mg/dL (ref 8.9–10.3)
Chloride: 105 mmol/L (ref 98–111)
Creatinine, Ser: 1 mg/dL (ref 0.44–1.00)
GFR calc Af Amer: >60 mL/min (ref >60)
GFR calc non Af Amer: >60 mL/min (ref >60)
Glucose, Bld: 123 mg/dL — ABNORMAL HIGH (ref 70–99)
Potassium: 3.7 mmol/L (ref 3.5–5.1)
Sodium: 140 mmol/L (ref 135–145)

## 2019-01-22 LAB — HIV ANTIBODY (ROUTINE TESTING W REFLEX): HIV Screen 4th Generation wRfx: NONREACTIVE

## 2019-01-22 MED ORDER — POLYETHYLENE GLYCOL 3350 17 G PO PACK
17.0000 g | PACK | Freq: Once | ORAL | Status: DC
  Filled 2019-01-22: qty 1

## 2019-01-22 MED ORDER — FUROSEMIDE 40 MG PO TABS
80.0000 mg | ORAL_TABLET | Freq: Every day | ORAL | Status: DC
  Administered 2019-01-23: 80 mg via ORAL
  Filled 2019-01-22: qty 2

## 2019-01-22 MED ORDER — LISINOPRIL 5 MG PO TABS
5.0000 mg | ORAL_TABLET | Freq: Every day | ORAL | Status: DC
  Administered 2019-01-23: 5 mg via ORAL
  Filled 2019-01-22: qty 1

## 2019-01-22 MED ORDER — ENOXAPARIN SODIUM 40 MG/0.4ML ~~LOC~~ SOLN: 40.0000 mg | SUBCUTANEOUS | Status: DC

## 2019-01-22 MED ORDER — CARVEDILOL 12.5 MG PO TABS
12.5000 mg | ORAL_TABLET | Freq: Two times a day (BID) | ORAL | Status: DC
  Administered 2019-01-22 – 2019-01-23 (×2): 12.5 mg via ORAL
  Filled 2019-01-22 (×2): qty 1

## 2019-01-22 MED ORDER — GLUCERNA SHAKE PO LIQD
237.0000 mL | Freq: Two times a day (BID) | ORAL | Status: DC
  Administered 2019-01-22: 16:00:00 237 mL via ORAL

## 2019-01-22 MED ORDER — PERFLUTREN LIPID MICROSPHERE
1.0000 mL | INTRAVENOUS | Status: AC | PRN
  Administered 2019-01-22: 15:00:00 2 mL via INTRAVENOUS
  Filled 2019-01-22: qty 10

## 2019-01-22 NOTE — Plan of Care (Signed)
Nutrition Education Note  RD consulted for nutrition education regarding CHF.  58 y/o female admitted with CHF  Spoke with pt via telephone. Pt reports good appetite and oral intake pta and currently. 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 Patient did ask several questions regarding her fluid restriction which RD answered. Per chart, pt is weight stable pta.   RD provided "Low Sodium Nutrition Therapy" handout from the [employer] via mail. Reviewed patient's dietary recall. Provided examples on ways to decrease sodium intake in diet. Discouraged intake of processed foods and use of salt shaker. Encouraged fresh fruits and vegetables as well as whole grain sources of carbohydrates to maximize fiber intake.   RD discussed why it is important for patient to adhere to diet recommendations, and emphasized the role of fluids, foods to avoid, and importance of weighing self daily. Teach back method used.  Expect fair compliance.  Body mass index is 29.59 kg/m. Pt meets criteria for overweight based on current BMI.  Current diet order is HH, patient is consuming approximately 80-100% of meals at this time. Labs and medications reviewed.   RD will add Glucerna BID as patient is requesting supplements.   No further nutrition interventions warranted at this time. RD contact information provided. If additional nutrition issues arise, please re-consult RD.   Betsey Holiday MS, RD, LDN Pager #- [phone] Office#- [phone] After Hours Pager: [phone]

## 2019-01-22 NOTE — Progress Notes (Signed)
*  PRELIMINARY RESULTS* Echocardiogram 2D Echocardiogram has been performed.  Heather Choi 01/22/2019, 2:56 PM

## 2019-01-22 NOTE — Progress Notes (Signed)
[facility] Cardiology [facility] Encounter Note  Patient: Heather Choi / Admit Date: 01/20/2019 / Date of Encounter: 01/22/2019, 9:09 AM   Subjective: Patient breathing much better today.  Less shortness of breath and weakness.  Patient does have still some mild amount of abdominal bloating and mild amount of nausea.  Telemetry shows normal sinus rhythm.  No evidence of myocardial infarction.  Review of Systems: Positive for: Shortness of breath nausea Negative for: Vision change, hearing change, syncope, dizziness,  vomiting,diarrhea, bloody stool, stomach pain, cough, congestion, diaphoresis, urinary frequency, urinary pain,skin lesions, skin rashes Others previously listed  Objective: Telemetry: Normal sinus rhythm Physical Exam: Blood pressure 138/80, pulse 80, temperature 98.4 F (36.9 C), temperature source Oral, resp. rate 16, height 5\' 1"  (1.549 m), weight 71 kg, SpO2 96 %. Body mass index is 29.59 kg/m. General: Well developed, well nourished, in no acute distress. Head: Normocephalic, atraumatic, sclera non-icteric, no xanthomas, nares are without discharge. Neck: No apparent masses Lungs: Normal respirations with no wheezes, no rhonchi, no rales , no crackles   Heart: Regular rate and rhythm, normal S1 S2, no murmur, no rub, no gallop, PMI is normal size and placement, carotid upstroke normal without bruit, jugular venous pressure normal Abdomen: Soft, non-tender, distended with normoactive bowel sounds. No hepatosplenomegaly. Abdominal aorta is normal size without bruit Extremities: Trace edema, no clubbing, no cyanosis, no ulcers,  Peripheral: 2+ radial, 2+ femoral, 2+ dorsal pedal pulses Neuro: Alert and oriented. Moves all extremities spontaneously. Psych:  Responds to questions appropriately with a normal affect.   Intake/Output Summary (Last 24 hours) at 01/22/2019 0909 Last data filed at 01/22/2019 0410 Gross per 24 hour  Intake 720 ml  Output 1800 ml  Net -1080  ml    Inpatient Medications:  . aspirin EC  81 mg Oral Daily  . digoxin  0.0625 mg Oral Daily  . doxycycline  100 mg Oral Q12H  . enoxaparin (LOVENOX) injection  40 mg Subcutaneous Q24H  . furosemide  60 mg Intravenous BID  . levothyroxine  150 mcg Oral Daily  . nicotine  7 mg Transdermal Daily   Infusions:   Labs: Recent Labs    01/21/19 0425 01/22/19 0407  NA 139 140  K 4.1 3.7  CL 105 105  CO2 24 24  GLUCOSE 190* 123*  BUN 21* 33*  CREATININE 1.02* 1.00  CALCIUM 9.6 9.2   Recent Labs    01/20/19 2009  AST 15  ALT 9  ALKPHOS 75  BILITOT 0.4  PROT 7.1  ALBUMIN 3.9   Recent Labs    01/20/19 2009 01/21/19 0425  WBC 6.6 6.6  NEUTROABS 4.1  --   HGB 13.8 14.6  HCT 43.5 45.6  MCV 87.2 85.4  PLT 125* 136*   Recent Labs    01/20/19 2009  TROPONINI 0.03*   Invalid input(s): POCBNP No results for input(s): HGBA1C in the last 72 hours.   Weights: Filed Weights   01/20/19 2319 01/21/19 0521 01/22/19 0410  Weight: 70.6 kg 69.6 kg 71 kg     Radiology/Studies:  US Pelvis (transabdominal Only)  Result Date: 01/21/2019 CLINICAL DATA:  58 year old female with pelvic pain and fullness status post abdominal hysterectomy, and then a laparoscopic bilateral oophorectomy in February 2019 for removal of a cystic adnexal mass. EXAM: TRANSABDOMINAL ULTRASOUND OF PELVIS TECHNIQUE: Transabdominal ultrasound examination of the pelvis was performed including evaluation of the uterus, ovaries, adnexal regions, and pelvic cul-de-sac. COMPARISON:  Pre for ectomy CT Abdomen and Pelvis 10/31/2017.  FINDINGS: Uterus Measurements: Surgically absent. Endometrium Thickness: Surgically absent. Right ovary Measurements: Surgically absent. Left ovary Measurements: Surgically absent. Other findings: No pelvic free fluid. No pelvic mass or abnormality is evident by ultrasound. Diminutive urinary bladder partially visible. IMPRESSION: Negative ultrasound appearance of the pelvis after  hysterectomy and bilateral oophorectomy. Electronically Signed   By: Odessa FlemingH  Hall M.D.   On: 01/21/2019 08:40   Dg Chest Port 1 View  Result Date: 01/21/2019 CLINICAL DATA:  Congestive failure EXAM: PORTABLE CHEST 1 VIEW COMPARISON:  01/20/2019 FINDINGS: Cardiac shadow is again enlarged. Defibrillator is again noted. Lungs are well aerated bilaterally. Mild interstitial changes are seen stable from the previous day. No sizable effusion is noted. No focal confluent infiltrate is noted. IMPRESSION: Stable changes of vascular congestion and interstitial edema. No new focal abnormality is noted. Electronically Signed   By: Alcide CleverMark  Lukens M.D.   On: 01/21/2019 08:04   Dg Chest Port 1 View  Result Date: 01/20/2019 CLINICAL DATA:  Shortness of breath, cough EXAM: PORTABLE CHEST 1 VIEW COMPARISON:  09/30/2018 FINDINGS: Left AICD remains in place, unchanged. Cardiomegaly with mild vascular congestion. No confluent opacities or effusions. No overt edema. No acute bony abnormality. IMPRESSION: Cardiomegaly, vascular congestion. Electronically Signed   By: Charlett NoseKevin  Dover M.D.   On: 01/20/2019 20:54     Assessment and Recommendation  58 y.o. female with acute on chronic systolic dysfunction congestive heart failure significantly improved with intravenous diuresis now with still slight abdominal bloating and nausea without evidence of myocardial infarction 1.  Low-dose beta-blocker as tolerated for cardiomyopathy LV systolic dysfunction and congestive heart failure 2.  Okay for furosemide to oral medication management and consider 80 mg each day 3.  Further possibility of treatment options including Aldactone and ACE inhibitor after above as able 4.  Begin ambulation and follow for improvements of symptoms and possible discharge home if improving with further adjustments of medication management as an outpatient 5.  No further cardiac diagnostics necessary at this time  Signed, Arnoldo HookerBruce Lizandro Spellman M.D. FACC

## 2019-01-22 NOTE — Progress Notes (Signed)
[employer] [facility] - [place] at [facility]   PATIENT NAME: Heather Choi    MR#:  [id number]  DATE OF BIRTH:  04/25/1961  SUBJECTIVE:  CHIEF COMPLAINT:   Chief Complaint  Patient presents with  . Shortness of Breath   Patient has shortness of breath on exertion.  Diuresing well. Feels her abdomen is distended secondary to fluid. No complaints of chest pain  REVIEW OF SYSTEMS:    Review of Systems  Constitutional: Negative for chills and fever.  HENT: Negative for sore throat.   Eyes: Negative for blurred vision, double vision and pain.  Respiratory: Positive for shortness of breath. Negative for cough, hemoptysis and wheezing.   Cardiovascular: Positive for orthopnea and leg swelling. Negative for chest pain and palpitations.  Gastrointestinal: Negative for abdominal pain, constipation, diarrhea, heartburn, nausea and vomiting.  Genitourinary: Negative for dysuria and hematuria.  Musculoskeletal: Negative for back pain and joint pain.  Skin: Negative for rash.  Neurological: Negative for sensory change, speech change, focal weakness and headaches.  Endo/Heme/Allergies: Does not bruise/bleed easily.  Psychiatric/Behavioral: Negative for depression. The patient is not nervous/anxious.     DRUG ALLERGIES:   Allergies  Allergen Reactions  . Amlodipine Other (See Comments) and Itching  . Penicillins Other (See Comments)    Has patient had a PCN reaction causing immediate rash, facial/tongue/throat swelling, SOB or lightheadedness with hypotension: Yes Has patient had a PCN reaction causing severe rash involving mucus membranes or skin necrosis: No Has patient had a PCN reaction that required hospitalization: No Has patient had a PCN reaction occurring within the last 10 years: Yes If all of the above answers are "NO", then may proceed with Cephalosporin use.  Marland Kitchen. Hydrochlorothiazide Rash    VITALS:  Blood pressure 138/80, pulse 82, temperature 98.4 F (36.9 C),  temperature source Oral, resp. rate 16, height 5\' 1"  (1.549 m), weight 71 kg, SpO2 96 %.  PHYSICAL EXAMINATION:   Physical Exam  GENERAL:  58 y.o.-year-old patient lying in the bed with no acute distress.  EYES: Pupils equal, round, reactive to light and accommodation. No scleral icterus. Extraocular muscles intact.  HEENT: Head atraumatic, normocephalic. Oropharynx and nasopharynx clear.  NECK:  Supple, no jugular venous distention. No thyroid enlargement, no tenderness.  LUNGS: Improved breath sounds bilaterally, no wheezing, rales, rhonchi. No use of accessory muscles of respiration.  CARDIOVASCULAR: S1, S2 normal. No murmurs, rubs, or gallops.  ABDOMEN: Soft, nontender, nondistended. Bowel sounds present. No organomegaly or mass.  EXTREMITIES: No cyanosis, clubbing Decreased edema b/l.    NEUROLOGIC: Cranial nerves II through XII are intact. No focal Motor or sensory deficits b/l.   PSYCHIATRIC: The patient is alert and oriented x 3.  SKIN: No obvious rash, lesion, or ulcer.   LABORATORY PANEL:   CBC Recent Labs  Lab 01/21/19 0425  WBC 6.6  HGB 14.6  HCT 45.6  PLT 136*   ------------------------------------------------------------------------------------------------------------------ Chemistries  Recent Labs  Lab 01/20/19 2009  01/22/19 0407  NA 142   < > 140  K 4.0   < > 3.7  CL 105   < > 105  CO2 28   < > 24  GLUCOSE 183*   < > 123*  BUN 18   < > 33*  CREATININE 1.09*   < > 1.00  CALCIUM 9.0   < > 9.2  AST 15  --   --   ALT 9  --   --   ALKPHOS 75  --   --  BILITOT 0.4  --   --    < > = values in this interval not displayed.   ------------------------------------------------------------------------------------------------------------------  Cardiac Enzymes Recent Labs  Lab 01/20/19 2009  TROPONINI 0.03*   ------------------------------------------------------------------------------------------------------------------  RADIOLOGY:  US Pelvis  (transabdominal Only)  Result Date: 01/21/2019 CLINICAL DATA:  58 year old female with pelvic pain and fullness status post abdominal hysterectomy, and then a laparoscopic bilateral oophorectomy in February 2019 for removal of a cystic adnexal mass. EXAM: TRANSABDOMINAL ULTRASOUND OF PELVIS TECHNIQUE: Transabdominal ultrasound examination of the pelvis was performed including evaluation of the uterus, ovaries, adnexal regions, and pelvic cul-de-sac. COMPARISON:  Pre for ectomy CT Abdomen and Pelvis 10/31/2017. FINDINGS: Uterus Measurements: Surgically absent. Endometrium Thickness: Surgically absent. Right ovary Measurements: Surgically absent. Left ovary Measurements: Surgically absent. Other findings: No pelvic free fluid. No pelvic mass or abnormality is evident by ultrasound. Diminutive urinary bladder partially visible. IMPRESSION: Negative ultrasound appearance of the pelvis after hysterectomy and bilateral oophorectomy. Electronically Signed   By: Odessa Fleming M.D.   On: 01/21/2019 08:40   Dg Chest Port 1 View  Result Date: 01/21/2019 CLINICAL DATA:  Congestive failure EXAM: PORTABLE CHEST 1 VIEW COMPARISON:  01/20/2019 FINDINGS: Cardiac shadow is again enlarged. Defibrillator is again noted. Lungs are well aerated bilaterally. Mild interstitial changes are seen stable from the previous day. No sizable effusion is noted. No focal confluent infiltrate is noted. IMPRESSION: Stable changes of vascular congestion and interstitial edema. No new focal abnormality is noted. Electronically Signed   By: Alcide Clever M.D.   On: 01/21/2019 08:04   Dg Chest Port 1 View  Result Date: 01/20/2019 CLINICAL DATA:  Shortness of breath, cough EXAM: PORTABLE CHEST 1 VIEW COMPARISON:  09/30/2018 FINDINGS: Left AICD remains in place, unchanged. Cardiomegaly with mild vascular congestion. No confluent opacities or effusions. No overt edema. No acute bony abnormality. IMPRESSION: Cardiomegaly, vascular congestion.  Electronically Signed   By: Charlett Nose M.D.   On: 01/20/2019 20:54     ASSESSMENT AND PLAN:   *  Acute on chronic systolic CHF (congestive heart failure)  She has gained 15 pounds in the last 2 months. -Discontinue IV Lasix,  Continue beta blockers Start oral Lasix for diuresis - Input and Output - Counseled to limit fluids and Salt - Monitor Bun/Cr and Potassium - Echo-reordered and pending -Cardiology follow up appreciated Consider Aldactone and ACE inhibitor once optimized  * Abdominal distention due to ascites from CHF.  Patient had history of ovarian cancer/cyst and had hysterectomy pelvic ultrasound ordered on admission shows nothing acute.   * COPD (chronic obstructive pulmonary disease)  No wheezing.  Nebulizers as needed   * HTN  -home dose antihypertensives   * Hypothyroidism -home dose thyroid replacement   * Anxiety -home dose anxiolytic  All the records are reviewed and case discussed with Care Management/Social Workerr. Management plans discussed with the patient, family and they are in agreement.  CODE STATUS: Full code  DVT Prophylaxis: SCDs  TOTAL TIME TAKING CARE OF THIS PATIENT: 35 minutes.   POSSIBLE D/C IN 1-2 DAYS, DEPENDING ON CLINICAL CONDITION.  Ihor Austin M.D on 01/22/2019 at 11:16 AM  Between 7am to 6pm - Pager - [phone]  After 6pm go to [email or web address] - password [email or web address] [facility]  [facility] [place] [facility]  Office  [phone]  CC: Primary care physician; Kandyce Rud, MD  Note: This dictation was prepared with Dragon dictation along with smaller phrase technology. Any transcriptional errors that result from this process are unintentional.

## 2019-01-22 NOTE — Progress Notes (Signed)
Cardiovascular and Pulmonary Nurse Navigator Note:    58 year old female with PMHx asthma, CHF, COPD, HTN, and thyroid disease who presented to the ED with c/o SOB and lower extremity edema.   BNP 207.   CXR upon arrival:  Impression - cardiomegaly, vascular congestion.     CHF Education:? Patient lying in bed with HOB elevated watching television.  ? Educational session with patient completed.  Provided patient with "Living Better with Heart Failure" packet. Briefly reviewed definition of heart failure and signs and symptoms of an exacerbation.?Explained to patient that HF is a chronic illness which requires self-assessment / self-management along with help from the cardiologist/PCP.?Discussed the Ejection Fraction measurement.  Reviewd her previous EF readings (11/11/2016 EF of  20 - 25%) and 10/30/2017 EF of 25 - 30%) with patient.   Echo ordered by Dr. Tobi Bastos today.  ? ? *Reviewed importance of and reason behind checking weight daily in the AM, after using the bathroom, but before getting dressed. Patient has functioning scale.  ? *Reviewed with patient the following information: *Discussed when to call the Dr= weight gain of >2-3lb overnight of 5lb in a week,  *Discussed yellow zone= call MD: weight gain of >2-3lb overnight of 5lb in a week, increased swelling, increased SOB when lying down, chest discomfort, dizziness, increased fatigue *Red Zone= call 911: struggle to breath, fainting or near fainting, significant chest pain   HF Zone Magnet reviewed with patient.    *Diet - Reviewed low sodium diet-provided handout of recommended and not recommended foods.  Dietitian Consultation for education completed today.  ? *Discussed fluid intake with patient as well. Patient is currently on 1500 ml fluid restriction.  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   ? *Instructed patient to take medications as prescribed for heart failure.  Explained briefly why pt is on the medications (either make you feel better, live longer or keep you out of the hospital) and discussed monitoring and side effects. Patient has been on several different medications over the years for HF.  Patient currently on the following cardiac medications:   Aspirin, coreg, lanoxin, Lasix, and Lisinopril,,  ? *Discussed exercise / activity.  Informed patient with diagnosis of CHF with an EF of 25 - 30% she is a candidate for Cardiac Rehab.  Patient stated she does not exercise per se, but is active.  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  Patient also informed this RN she stays active as her niece died and she has 5 children.  She is helping take care of two of the children.  Patient has declined participation in Cardiac Rehab.  Encouraged patient to be as active as possible.  ? *Smoking Cessation- Patient is a CURRENT smoker.  Patient smokes 1/2 pack per day.  Patient informed this RN that she has tried to quit before, but has never been successful.  Patient currently using Nicotine patch while here in the hospital.  "Thinking about Quitting - Yes, You Can" given and reviewed with patient.  2020 Quit Smart Schedule including New Telephonic Class option given to patient.   ? *[facility] Failure [facility] - Explained the purpose of the HF Clinic. ?Explained to patient the HF Clinic does not replace PCP nor Cardiologist, but is an additional resource to helping patient manage heart failure at home. Patient does not wish to be followed in the [facility] HF Clinic. As  a result, the appointment which had been made for the patient in the [facility] was cancelled per patient's request.   ? Again, the 5 Steps to Living Better with Heart Failure were reviewed with patient.  ? Patient thanked me for providing the above information. ? ? Army Meliaiane , RN, BSN, [facility]? [facility]  [facility] Cardiac &?Pulmonary Rehab  Cardiovascular &?Pulmonary Nurse Navigator  Direct  Line: [phone]  Department Phone #: [phone] Fax: [phone]? Email Address: [email or web address]

## 2019-01-22 NOTE — Consult Note (Addendum)
[place] - PHARMACIST COUNSELING NOTE  ADHERENCE ASSESSMENT Patient states she had this diagnosis for a long time and understands what heart failure is. She states he does not miss doses of her medications and has no problem obtaining the medications.   Guideline-Directed Medical Therapy/Evidence Based Medicine  ACE/ARB/ARNI: Lisinopril 5 mg daily  Beta Blocker: Coreg 25 mg BID - dose reduced to 12.5 mg BID, plan to titrate slowly.  Aldosterone Antagonist: Spironolactone 25 mg daily - currently holding.  Diuretic: Furosemide 40 mg daily - plan to increase to furosemide 80 mg daily.     SUBJECTIVE   HPI: Patient presents the ED with a complaint of shortness of breath.  She states that she has had lower extremity edema today, more than usual.  She has a known history of significant heart failure with an EF around 25%.  She also has a history of COPD.  She denies any fever, chills, cough, nausea or vomiting, diarrhea.  Work-up here in the ED is largely consistent with CHF exacerbation, with significant edema on chest x-ray.   Past Medical History:  Diagnosis Date  . Asthma   . Bell's palsy    fingers tingling in cold weather  . CHF (congestive heart failure) ([place])   . COPD (chronic obstructive pulmonary disease) ([place])   . Enlarged heart   . Hypertension   . Thyroid disease         OBJECTIVE    Vital signs: HR 80s, BP 130-140/70-90, weight (pounds) 156 lb  ECHO: Date 10/30/2017, EF 25-30%  BMP Latest Ref Rng & Units 01/22/2019 01/21/2019 01/20/2019  Glucose 70 - 99 mg/dL 123(H) 190(H) 183(H)  BUN 6 - 20 mg/dL 33(H) 21(H) 18  Creatinine 0.44 - 1.00 mg/dL 1.00 1.02(H) 1.09(H)  Sodium 135 - 145 mmol/L 140 139 142  Potassium 3.5 - 5.1 mmol/L 3.7 4.1 4.0  Chloride 98 - 111 mmol/L 105 105 105  CO2 22 - 32 mmol/L 24 24 28   Calcium 8.9 - 10.3 mg/dL 9.2 9.6 9.0    ASSESSMENT Patient's last echo is over a year old. Creatinine stable,  potassium stable. Blood pressure is slightly elevated in the 140s and HR in th 70-80s. Restarting patient's home medications today and tomorrow. Patient did not have any questions or concerns about her medications.  Patient's SOB and LEE has improved on IV lasix.  Pt is also on digoxin and the dose was reduced during hospitalization.    PLAN Recommend to obtain another echo. Patient will start coreg and lisinopril today. Monitor potassium and blood pressure to assess restart of spironolactone. Plan to change to PO lasix tomorrow. Recommend getting a digoxin level on 01/24/2019. Goal digoxin level for HF is 0.5 to 1. Patient was informed the importance of daily weights and monitoring blood pressure. She understands the importance of limiting salt intake as well. Patient understands the importance of seeing PCP after admission to assess and monitor drug therapy.     Time spent: 20 minutes  Oswald Hillock, Pharm.D, BCPS Clinical Pharmacist 01/22/2019 12:09 PM    Current Facility-Administered Medications:  .  acetaminophen (TYLENOL) tablet 650 mg, 650 mg, Oral, Q6H PRN **OR** acetaminophen (TYLENOL) suppository 650 mg, 650 mg, Rectal, Q6H PRN, Lance Coon, MD .  ALPRAZolam Duanne Moron) tablet 0.5 mg, 0.5 mg, Oral, QHS PRN, Lance Coon, MD .  aspirin EC tablet 81 mg, 81 mg, Oral, Daily, Lance Coon, MD, 81 mg at 01/22/19 0909 .  carvedilol (COREG)  tablet 12.5 mg, 12.5 mg, Oral, BID WC, Pyreddy, Pavan, MD .  digoxin (LANOXIN) tablet 0.0625 mg, 0.0625 mg, Oral, Daily, Lance Coon, MD, 0.0625 mg at 01/22/19 0910 .  doxycycline (VIBRA-TABS) tablet 100 mg, 100 mg, Oral, Q12H, Lance Coon, MD, 100 mg at 01/22/19 0909 .  enoxaparin (LOVENOX) injection 40 mg, 40 mg, Subcutaneous, Q24H, Lance Coon, MD, 40 mg at 01/21/19 2153 .  feeding supplement (GLUCERNA SHAKE) (GLUCERNA SHAKE) liquid 237 mL, 237 mL, Oral, BID BM, Pyreddy, Pavan, MD .  furosemide (LASIX) injection 60 mg, 60 mg, Intravenous, BID,  Pyreddy, Pavan, MD, 60 mg at 01/22/19 0910 .  [START ON 01/23/2019] furosemide (LASIX) tablet 80 mg, 80 mg, Oral, Daily, Pyreddy, Pavan, MD .  Ipratropium-Albuterol (COMBIVENT) respimat 1 puff, 1 puff, Inhalation, Q6H PRN, Lance Coon, MD .  levothyroxine (SYNTHROID) tablet 150 mcg, 150 mcg, Oral, Daily, Lance Coon, MD, 150 mcg at 01/22/19 0531 .  [START ON 01/23/2019] lisinopril (ZESTRIL) tablet 5 mg, 5 mg, Oral, Daily, Pyreddy, Pavan, MD .  nicotine (NICODERM CQ - dosed in mg/24 hr) patch 7 mg, 7 mg, Transdermal, Daily, Lance Coon, MD, 7 mg at 01/22/19 0911 .  ondansetron (ZOFRAN) tablet 4 mg, 4 mg, Oral, Q6H PRN, 4 mg at 01/22/19 0920 **OR** ondansetron (ZOFRAN) injection 4 mg, 4 mg, Intravenous, Q6H PRN, Lance Coon, MD .  polyethylene glycol (MIRALAX / GLYCOLAX) packet 17 g, 17 g, Oral, Once, Pyreddy, Pavan, MD   COUNSELING POINTS/CLINICAL PEARLS  Carvedilol (Goal: weight less than 85 kg is 25 mg BID, weight greater than 85 kg is 50 mg BID)  Patient should avoid activities requiring coordination until drug effects are  realized, as drug may cause dizziness.  This drug may cause diarrhea, nausea, vomiting, arthralgia, back pain, myalgia, headache, vision disorder, erectile dysfunction, reduced libido, or fatigue.  Instruct patient to report signs/symptoms of adverse cardiovascular effects such as hypotension (especially in elderly patients), arrhythmias, syncope, palpitations, angina, or edema.  Drug may mask symptoms of hypoglycemia. Advise diabetic patients to carefully monitor blood sugar levels.  Patient should take drug with food.  Advise patient against sudden discontinuation of drug. Lisinopril (Goal: 20 to 40 mg once daily)  This drug may cause nausea, vomiting, dizziness, headache, or  angioedema of face, lips, throat, or intestines.  Instruct patient to report signs/symptoms of hypotension, or a persistent  cough.  Advise patient against sudden discontinuation of  drug. Furosemide  Drug causes sun-sensitivity. Advise patient to use sunscreen and avoid  tanning beds. Patient should avoid activities requiring coordination until drug effects are realized, as drug may cause dizziness, vertigo, or blurred vision. This drug may cause hyperglycemia, hyperuricemia, constipation, diarrhea, loss of appetite, nausea, vomiting, purpuric disorder, cramps, spasticity, asthenia, headache, paresthesia, or scaling eczema. Instruct patient to report unusual bleeding/bruising or signs/symptoms of hypotension, infection, pancreatitis, or ototoxicity (tinnitus, hearing impairment). Advise patient to report signs/symptoms of a severe skin reactions (flu-like symptoms, spreading red rash, or skin/mucous membrane blistering) or erythema multiforme. Instruct patient to eat high-potassium foods during drug therapy, as  directed by healthcare professional.  Patient should not drink alcohol while taking this drug. Spironolactone  Warn patient to report dehydration, hypotension, or symptoms of  worsening renal function.  Counsel female patient to report gynecomastia.  Side effects may include diarrhea, nausea, vomiting, abdominal cramping, fever, leg cramps, lethargy, mental confusion, decreased libido, irregular menses, and rash. Suspension: Tell patient to take drug consistently with respect to food,  either before or after a meal.  Advise patient to avoid potassium supplements and foods containing high levels of potassium, including salt substitutes.  DRUGS TO AVOID IN HEART FAILURE  Drug or Class Mechanism  Analgesics . NSAIDs . COX-2 inhibitors . Glucocorticoids  Sodium and water retention, increased systemic vascular resistance, decreased response to diuretics   Diabetes Medications . Metformin . Thiazolidinediones o Rosiglitazone (Avandia) o Pioglitazone (Actos) . DPP4 Inhibitors o Saxagliptin (Onglyza) o Sitagliptin (Januvia)   Lactic acidosis Possible calcium  channel blockade   Unknown  Antiarrhythmics . Class I  o Flecainide o Disopyramide . Class III o Sotalol . Other o Dronedarone  Negative inotrope, proarrhythmic   Proarrhythmic, beta blockade  Negative inotrope  Antihypertensives . Alpha Blockers o Doxazosin . Calcium Channel Blockers o Diltiazem o Verapamil o Nifedipine . Central Alpha Adrenergics o Moxonidine . Peripheral Vasodilators o Minoxidil  Increases renin and aldosterone  Negative inotrope    Possible sympathetic withdrawal  Unknown  Anti-infective . Itraconazole . Amphotericin B  Negative inotrope Unknown  Hematologic . Anagrelide . Cilostazol   Possible inhibition of PD IV Inhibition of PD III causing arrhythmias  Neurologic/Psychiatric . Stimulants . Anti-Seizure Drugs o Carbamazepine o Pregabalin . Antidepressants o Tricyclics o Citalopram . Parkinsons o Bromocriptine o Pergolide o Pramipexole . Antipsychotics o Clozapine . Antimigraine o Ergotamine o Methysergide . Appetite suppressants . Bipolar o Lithium  Peripheral alpha and beta agonist activity  Negative inotrope and chronotrope Calcium channel blockade  Negative inotrope, proarrhythmic Dose-dependent QT prolongation  Excessive serotonin activity/valvular damage Excessive serotonin activity/valvular damage Unknown  IgE mediated hypersensitivy, calcium channel blockade  Excessive serotonin activity/valvular damage Excessive serotonin activity/valvular damage Valvular damage  Direct myofibrillar degeneration, adrenergic stimulation  Antimalarials . Chloroquine . Hydroxychloroquine Intracellular inhibition of lysosomal enzymes  Urologic Agents . Alpha Blockers o Doxazosin o Prazosin o Tamsulosin o Terazosin  Increased renin and aldosterone  Adapted from Page RL, et al. "Drugs That May Cause or Exacerbate Heart Failure: A Scientific Statement from the [employer]." Circulation 2016;  833:A25-K53. DOI: 10.1161/CIR.0000000000000426   MEDICATION ADHERENCES TIPS AND STRATEGIES 1. Taking medication as prescribed improves patient outcomes in heart failure (reduces hospitalizations, improves symptoms, increases survival) 2. Side effects of medications can be managed by decreasing doses, switching agents, stopping drugs, or adding additional therapy. Please let someone in the [facility] know if you have having bothersome side effects so we can modify your regimen. Do not alter your medication regimen without talking to [place].  3. Medication reminders can help patients remember to take drugs on time. If you are missing or forgetting doses you can try linking behaviors, using pill boxes, or an electronic reminder like an alarm on your phone or an app. Some people can also get automated phone calls as medication reminders.

## 2019-01-23 LAB — POTASSIUM: Potassium: 4 mmol/L (ref 3.5–5.1)

## 2019-01-23 LAB — ECHOCARDIOGRAM COMPLETE
Height: 61 in
Weight: 2505.6 oz

## 2019-01-23 MED ORDER — CARVEDILOL 12.5 MG PO TABS: 12.5000 mg | ORAL_TABLET | Freq: Two times a day (BID) | ORAL | 0 refills | Status: DC

## 2019-01-23 MED ORDER — FUROSEMIDE 80 MG PO TABS: 80.0000 mg | ORAL_TABLET | Freq: Every day | ORAL | 1 refills | Status: DC

## 2019-01-23 MED ORDER — GLUCERNA SHAKE PO LIQD: 237.0000 mL | Freq: Two times a day (BID) | ORAL | 0 refills | Status: AC

## 2019-01-23 NOTE — Progress Notes (Signed)
Advanced care plan. Purpose of the Encounter: CODE STATUS Parties in Attendance:Patient Patient's Decision Capacity:Good Subjective/Patient's story: Heather Choi  is a 58 y.o. female who presents with chief complaint as above.  Patient presents the ED with a complaint of shortness of breath.  She states that she has had lower extremity edema today, more than usual.  She has a known history of significant heart failure with an EF around 25%.  She also has a history of COPD.  She denies any fever, chills, cough, nausea or vomiting, diarrhea.  Work-up here in the ED is largely consistent with CHF exacerbation, with significant edema on chest x-ray.  However, patient does have a mildly elevated lactic acid as well.  Hospitalist were called for admission Objective/Medical story Patient needs aggressive diuresis with IV lasix. Needs cardiology evaluation and work up. Goals of care determination:  Advance care directives and goals of care discussed.  Patient wants everything done which includes cpr, intubation and ventilator if need arises. CODE STATUS: Full code Time spent discussing advanced care planning: 16 minutes

## 2019-01-23 NOTE — Progress Notes (Signed)
[employer] - [place] at [facility] Heather Choi was admitted to the Hospital on 01/20/2019 and Discharged  01/23/2019 and should be excused from work/school from 01/20/2019 to 01/27/2019.   Call Heather Austin MD with questions.  Heather Choi M.D on 01/23/2019,at 11:57 AM  [facility] [employer] - [place] at [facility]  [phone]

## 2019-01-23 NOTE — Discharge Summary (Signed)
[facility] [place] [facility]   PATIENT NAME: Heather Choi    MR#:  [id number]  DATE OF BIRTH:  05/10/1961  DATE OF ADMISSION:  01/20/2019 ADMITTING PHYSICIAN: Heather Manisavid Willis, MD  DATE OF DISCHARGE: 01/23/2019  PRIMARY CARE PHYSICIAN: Heather RudBabaoff, Marcus, MD   ADMISSION DIAGNOSIS:  COPD exacerbation (HCC) [J44.1] Acute respiratory failure with hypoxia (HCC) [J96.01] Acute on chronic congestive heart failure, unspecified heart failure type (HCC) [I50.9]  DISCHARGE DIAGNOSIS:  Principal Problem:   Acute on chronic systolic CHF (congestive heart failure) (HCC) Active Problems:   COPD (chronic obstructive pulmonary disease) (HCC)   HTN (hypertension)   Hypothyroidism   Anxiety   SECONDARY DIAGNOSIS:   Past Medical History:  Diagnosis Date  . Asthma   . Bell's palsy    fingers tingling in cold weather  . CHF (congestive heart failure) (HCC)   . COPD (chronic obstructive pulmonary disease) (HCC)   . Enlarged heart   . Hypertension   . Thyroid disease      ADMITTING HISTORY Heather Choi  is a 58 y.o. female who presents with chief complaint as above.  Patient presents the ED with a complaint of shortness of breath.  She states that she has had lower extremity edema today, more than usual.  She has a known history of significant heart failure with an EF around 25%.  She also has a history of COPD.  She denies any fever, chills, cough, nausea or vomiting, diarrhea.  Work-up here in the ED is largely consistent with CHF exacerbation, with significant edema on chest x-ray.  However, patient does have a mildly elevated lactic acid as well.  Hospitalist were called for admission  HOSPITAL COURSE:  Patient was admitted to telemetry for acute on chronic systolic heart failure exacerbation.  Diuresed aggressively with IV Lasix and [facility] during [facility].  Patient also continued home dose inhalers for COPD. Echocardiogram 1. The left  ventricle has severely reduced systolic function, with an ejection fraction of 25-30%. The cavity size was moderate to severely dilated. Left ventricular diastolic Doppler parameters are indeterminate.  2. The right ventricle has normal systolic function. The cavity was normal. There is no increase in right ventricular wall thickness.  3. Left atrial size was mild-moderately dilated.  4. The aortic valve is tricuspid. Aortic valve regurgitation is moderate by color flow Doppler. Patient was weaned off oxygen.  Significant diuresis was done patient felt much better.  Patient was switched to p.o. Lasix and will continue beta-blocker ACE inhibitor and follow-up with cardiology in the clinic.  During the hospitalization COVID-19 test was negative. CONSULTS OBTAINED:    DRUG ALLERGIES:   Allergies  Allergen Reactions  . Amlodipine Other (See Comments) and Itching  . Penicillins Other (See Comments)    Has patient had a PCN reaction causing immediate rash, facial/tongue/throat swelling, SOB or lightheadedness with hypotension: Yes Has patient had a PCN reaction causing severe rash involving mucus membranes or skin necrosis: No Has patient had a PCN reaction that required hospitalization: No Has patient had a PCN reaction occurring within the last 10 years: Yes If all of the above answers are "NO", then may proceed with Cephalosporin use.  Marland Kitchen. Hydrochlorothiazide Rash    DISCHARGE MEDICATIONS:   Allergies as of 01/23/2019      Reactions   Amlodipine Other (See Comments), Itching   Penicillins Other (See Comments)   Has patient had a PCN reaction causing immediate rash, facial/tongue/throat swelling, SOB or lightheadedness  with hypotension: Yes Has patient had a PCN reaction causing severe rash involving mucus membranes or skin necrosis: No Has patient had a PCN reaction that required hospitalization: No Has patient had a PCN reaction occurring within the last 10 years: Yes If all of the above  answers are "NO", then may proceed with Cephalosporin use.   Hydrochlorothiazide Rash      Medication List    TAKE these medications   ALPRAZolam 0.5 MG tablet Commonly known as:  XANAX Take 0.5 mg by mouth at bedtime as needed for anxiety.   aspirin EC 81 MG tablet Take 81 mg by mouth daily.   carvedilol 12.5 MG tablet Commonly known as:  COREG Take 1 tablet (12.5 mg total) by mouth 2 (two) times daily with a meal for 30 days. What changed:    medication strength  how much to take  when to take this  additional instructions   cetirizine 10 MG tablet Commonly known as:  ZYRTEC Take 10 mg by mouth daily.   digoxin 0.125 MG tablet Commonly known as:  LANOXIN Take 0.125 mg by mouth daily.   feeding supplement (GLUCERNA SHAKE) Liqd Take 237 mLs by mouth 2 (two) times daily between meals for 30 days.   fluticasone 50 MCG/ACT nasal spray Commonly known as:  FLONASE Place 2 sprays into both nostrils daily.   furosemide 80 MG tablet Commonly known as:  LASIX Take 1 tablet (80 mg total) by mouth daily for 30 days. What changed:    medication strength  how much to take   levothyroxine 137 MCG tablet Commonly known as:  SYNTHROID Take 137 mcg by mouth daily.   lisinopril 5 MG tablet Commonly known as:  ZESTRIL Take 5 mg by mouth daily.   potassium chloride SA 20 MEQ tablet Commonly known as:  K-DUR Take 20 mEq by mouth 2 (two) times daily.   ProAir RespiClick 108 (90 Base) MCG/ACT Aepb Generic drug:  Albuterol Sulfate Inhale 2 puffs into the lungs every 4 (four) hours.   SALONPAS PAIN RELIEF PATCH EX Apply 1 patch topically daily as needed (pain).   spironolactone 25 MG tablet Commonly known as:  ALDACTONE Take 25 mg by mouth daily.       Today  Patient seen today No chest pain Decrease shortness of breath Weaned off oxygen Hemodynamically stable VITAL SIGNS:  Blood pressure 117/80, pulse 72, temperature 98 F (36.7 C), temperature source  Oral, resp. rate 18, height  (1.549 m), weight 72.8 kg, SpO2 97 %.  I/O:    Intake/Output Summary (Last 24 hours) at 01/23/2019 1051 Last data filed at 01/23/2019 1001 Gross per 24 hour  Intake 600 ml  Output 2250 ml  Net -1650 ml    PHYSICAL EXAMINATION:  Physical Exam  GENERAL:  58 y.o.-year-old patient lying in the bed with no acute distress.  LUNGS: Normal breath sounds bilaterally, no wheezing, rales,rhonchi or crepitation. No use of accessory muscles of respiration.  CARDIOVASCULAR: S1, S2 normal. No murmurs, rubs, or gallops.  ABDOMEN: Soft, non-tender, non-distended. Bowel sounds present. No organomegaly or mass.  NEUROLOGIC: Moves all 4 extremities. PSYCHIATRIC: The patient is alert and oriented x 3.  SKIN: No obvious rash, lesion, or ulcer.   DATA REVIEW:   CBC Recent Labs  Lab 01/21/19 0425  WBC 6.6  HGB 14.6  HCT 45.6  PLT 136*    Chemistries  Recent Labs  Lab 01/20/19 2009  01/22/19 0407 01/23/19 0304  NA 142   < >  140  --   K 4.0   < > 3.7 4.0  CL 105   < > 105  --   CO2 28   < > 24  --   GLUCOSE 183*   < > 123*  --   BUN 18   < > 33*  --   CREATININE 1.09*   < > 1.00  --   CALCIUM 9.0   < > 9.2  --   AST 15  --   --   --   ALT 9  --   --   --   ALKPHOS 75  --   --   --   BILITOT 0.4  --   --   --    < > = values in this interval not displayed.    Cardiac Enzymes Recent Labs  Lab 01/20/19 2009  TROPONINI 0.03*    Microbiology Results  Results for orders placed or performed during the hospital encounter of 01/20/19  SARS Coronavirus 2 [facility] order, Performed in [facility] hospital lab)     Status: None   Collection Time: 01/20/19  8:10 PM  Result Value Ref Range Status   SARS Coronavirus 2 NEGATIVE NEGATIVE Final    Comment: (NOTE) If result is NEGATIVE SARS-CoV-2 target nucleic acids are NOT DETECTED. The SARS-CoV-2 RNA is generally detectable in upper and lower  respiratory specimens during the acute phase of infection.  The lowest  concentration of SARS-CoV-2 viral copies this assay can detect is 250  copies / mL. A negative result does not preclude SARS-CoV-2 infection  and should not be used as the sole basis for treatment or other  patient management decisions.  A negative result may occur with  improper specimen collection / handling, submission of specimen other  than nasopharyngeal swab, presence of viral mutation(s) within the  areas targeted by this assay, and inadequate number of viral copies  (<250 copies / mL). A negative result must be combined with clinical  observations, patient history, and epidemiological information. If result is POSITIVE SARS-CoV-2 target nucleic acids are DETECTED. The SARS-CoV-2 RNA is generally detectable in upper and lower  respiratory specimens dur ing the acute phase of infection.  Positive  results are indicative of active infection with SARS-CoV-2.  Clinical  correlation with patient history and other diagnostic information is  necessary to determine patient infection status.  Positive results do  not rule out bacterial infection or co-infection with other viruses. If result is PRESUMPTIVE POSTIVE SARS-CoV-2 nucleic acids MAY BE PRESENT.   A presumptive positive result was obtained on the submitted specimen  and confirmed on repeat testing.  While 2019 novel coronavirus  (SARS-CoV-2) nucleic acids may be present in the submitted sample  additional confirmatory testing may be necessary for epidemiological  and / or clinical management purposes  to differentiate between  SARS-CoV-2 and other Sarbecovirus currently known to infect humans.  If clinically indicated additional testing with an alternate test  methodology [phone]) is advised. The SARS-CoV-2 RNA is generally  detectable in upper and lower respiratory sp ecimens during the acute  phase of infection. The expected result is Negative. Fact Sheet for Patients:   BoilerBrush.com.cy Fact Sheet for Healthcare Providers: https://pope.com/ This test is not yet approved or cleared by the [place] FDA and has been authorized for detection and/or diagnosis of SARS-CoV-2 by FDA under an Emergency Use Authorization (EUA).  This EUA will remain in effect (meaning this test can be used) for the duration  of the COVID-19 declaration under Section 564(b)(1) of the Act, 21 U.S.C. section 360bbb-3(b)(1), unless the authorization is terminated or revoked sooner. Performed at [facility], [address] [place], [place] [address]   Blood Culture (routine x 2)     Status: None (Preliminary result)   Collection Time: 01/20/19  8:57 PM  Result Value Ref Range Status   Specimen Description BLOOD RIGHT ANTECUBITAL  Final   Special Requests   Final    BOTTLES DRAWN AEROBIC AND ANAEROBIC Blood Culture adequate volume   Culture   Final    NO GROWTH 3 DAYS Performed at [facility], [address] [place], [place] [address]    Report Status PENDING  Incomplete  Blood Culture (routine x 2)     Status: None (Preliminary result)   Collection Time: 01/20/19  8:58 PM  Result Value Ref Range Status   Specimen Description BLOOD LEFT ANTECUBITAL  Final   Special Requests   Final    BOTTLES DRAWN AEROBIC AND ANAEROBIC Blood Culture results may not be optimal due to an excessive volume of blood received in culture bottles   Culture   Final    NO GROWTH 3 DAYS Performed at [facility], [address] [place], [place] [address]    Report Status PENDING  Incomplete    RADIOLOGY:  No results found.  Follow up with PCP in 1 week.  Management plans discussed with the patient, family and they are in agreement.  CODE STATUS: Full code    Code Status Orders  (From admission, onward)         Start     Ordered   01/20/19 2300  Full code  Continuous     01/20/19 2259        Code  Status History    Date Active Date Inactive Code Status Order ID Comments User Context   10/30/2017 0240 10/31/2017 1828 Full Code [id number]  Arnaldo Natal, MD ED   11/11/2016 1632 11/12/2016 1622 Full Code [id number]  Ramonita Lab, MD Inpatient      TOTAL TIME TAKING CARE OF THIS PATIENT ON DAY OF DISCHARGE: more than 35 minutes.   Ihor Austin M.D on 01/23/2019 at 10:51 AM  Between 7am to 6pm - Pager - [phone]  After 6pm go to [email or web address] - password [email or web address] [facility]  [facility]  Office  [phone]  CC: Primary care physician; Heather Rud, MD  Note: This dictation was prepared with Dragon dictation along with smaller phrase technology. Any transcriptional errors that result from this process are unintentional.

## 2019-01-23 NOTE — Plan of Care (Signed)
Pt ready for discharge home.  Problem: Health Behavior/Discharge Planning: Goal: Ability to manage health-related needs will improve Outcome: Completed/Met   Problem: Clinical Measurements: Goal: Ability to maintain clinical measurements within normal limits will improve Outcome: Completed/Met Goal: Will remain free from infection Outcome: Completed/Met Goal: Diagnostic test results will improve Outcome: Completed/Met Goal: Respiratory complications will improve Outcome: Completed/Met Goal: Cardiovascular complication will be avoided Outcome: Completed/Met   Problem: Activity: Goal: Risk for activity intolerance will decrease Outcome: Completed/Met   Problem: Coping: Goal: Level of anxiety will decrease Outcome: Completed/Met   Problem: Elimination: Goal: Will not experience complications related to bowel motility Outcome: Completed/Met Goal: Will not experience complications related to urinary retention Outcome: Completed/Met   Problem: Pain Managment: Goal: General experience of comfort will improve Outcome: Completed/Met   Problem: Safety: Goal: Ability to remain free from injury will improve Outcome: Completed/Met   Problem: Skin Integrity: Goal: Risk for impaired skin integrity will decrease Outcome: Completed/Met   Problem: Education: Goal: Ability to demonstrate management of disease process will improve Outcome: Completed/Met Goal: Ability to verbalize understanding of medication therapies will improve Outcome: Completed/Met Goal: Individualized Educational Video(s) Outcome: Completed/Met   Problem: Activity: Goal: Capacity to carry out activities will improve Outcome: Completed/Met   Problem: Cardiac: Goal: Ability to achieve and maintain adequate cardiopulmonary perfusion will improve Outcome: Completed/Met

## 2019-01-25 LAB — CULTURE, BLOOD (ROUTINE X 2)
Culture: NO GROWTH
Culture: NO GROWTH
Special Requests: ADEQUATE

## 2019-01-29 ENCOUNTER — Telehealth: Payer: BLUE CROSS/BLUE SHIELD | Admitting: Family

## 2019-05-10 ENCOUNTER — Other Ambulatory Visit: Payer: Self-pay

## 2019-05-10 ENCOUNTER — Emergency Department: Payer: BC Managed Care – PPO

## 2019-05-10 ENCOUNTER — Emergency Department
Admission: EM | Admit: 2019-05-10 | Discharge: 2019-05-10 | Disposition: A | Discharge status: Home / Self Care | Payer: BC Managed Care – PPO | Attending: Emergency Medicine | Admitting: Emergency Medicine

## 2019-05-10 DIAGNOSIS — R42 Dizziness and giddiness: Secondary | ICD-10-CM | POA: Insufficient documentation

## 2019-05-10 DIAGNOSIS — J449 Chronic obstructive pulmonary disease, unspecified: Secondary | ICD-10-CM | POA: Insufficient documentation

## 2019-05-10 DIAGNOSIS — E039 Hypothyroidism, unspecified: Secondary | ICD-10-CM | POA: Diagnosis not present

## 2019-05-10 DIAGNOSIS — R000 Tachycardia, unspecified: Secondary | ICD-10-CM

## 2019-05-10 DIAGNOSIS — Z7982 Long term (current) use of aspirin: Secondary | ICD-10-CM | POA: Diagnosis not present

## 2019-05-10 DIAGNOSIS — F17210 Nicotine dependence, cigarettes, uncomplicated: Secondary | ICD-10-CM | POA: Insufficient documentation

## 2019-05-10 DIAGNOSIS — I110 Hypertensive heart disease with heart failure: Secondary | ICD-10-CM | POA: Diagnosis not present

## 2019-05-10 DIAGNOSIS — Z79899 Other long term (current) drug therapy: Secondary | ICD-10-CM | POA: Insufficient documentation

## 2019-05-10 DIAGNOSIS — R0789 Other chest pain: Secondary | ICD-10-CM | POA: Diagnosis not present

## 2019-05-10 DIAGNOSIS — I509 Heart failure, unspecified: Secondary | ICD-10-CM | POA: Diagnosis not present

## 2019-05-10 DIAGNOSIS — I471 Supraventricular tachycardia, unspecified: Secondary | ICD-10-CM

## 2019-05-10 DIAGNOSIS — R002 Palpitations: Secondary | ICD-10-CM | POA: Diagnosis present

## 2019-05-10 DIAGNOSIS — R1907 Generalized intra-abdominal and pelvic swelling, mass and lump: Secondary | ICD-10-CM | POA: Diagnosis not present

## 2019-05-10 LAB — CBC
HCT: 45.5 % (ref 36.0–46.0)
Hemoglobin: 14.7 g/dL (ref 12.0–15.0)
MCH: 27.5 pg (ref 26.0–34.0)
MCHC: 32.3 g/dL (ref 30.0–36.0)
MCV: 85.2 fL (ref 80.0–100.0)
Platelets: 177 10*3/uL (ref 150–400)
RBC: 5.34 MIL/uL — ABNORMAL HIGH (ref 3.87–5.11)
RDW: 15.2 % (ref 11.5–15.5)
WBC: 9.4 10*3/uL (ref 4.0–10.5)
nRBC: 0 % (ref 0.0–0.2)

## 2019-05-10 LAB — BASIC METABOLIC PANEL
Anion gap: 12 (ref 5–15)
BUN: 16 mg/dL (ref 6–20)
CO2: 23 mmol/L (ref 22–32)
Calcium: 9.2 mg/dL (ref 8.9–10.3)
Chloride: 103 mmol/L (ref 98–111)
Creatinine, Ser: 1.28 mg/dL — ABNORMAL HIGH (ref 0.44–1.00)
GFR calc Af Amer: 54 mL/min — ABNORMAL LOW (ref >60)
GFR calc non Af Amer: 46 mL/min — ABNORMAL LOW (ref >60)
Glucose, Bld: 192 mg/dL — ABNORMAL HIGH (ref 70–99)
Potassium: 4 mmol/L (ref 3.5–5.1)
Sodium: 138 mmol/L (ref 135–145)

## 2019-05-10 LAB — TROPONIN I (HIGH SENSITIVITY)
Troponin I (High Sensitivity): 39 ng/L — ABNORMAL HIGH (ref <18)
Troponin I (High Sensitivity): 42 ng/L — ABNORMAL HIGH (ref <18)

## 2019-05-10 LAB — DIGOXIN LEVEL: Digoxin Level: <0.2 ng/mL — ABNORMAL LOW (ref 0.8–2.0)

## 2019-05-10 LAB — BRAIN NATRIURETIC PEPTIDE: B Natriuretic Peptide: 80 pg/mL (ref 0.0–100.0)

## 2019-05-10 MED ORDER — ADENOSINE 6 MG/2ML IV SOLN
6.0000 mg | Freq: Once | INTRAVENOUS | Status: AC
  Administered 2019-05-10: 6 mg via INTRAVENOUS

## 2019-05-10 MED ORDER — IOHEXOL 300 MG/ML  SOLN
100.0000 mL | Freq: Once | INTRAMUSCULAR | Status: AC | PRN
  Administered 2019-05-10: 100 mL via INTRAVENOUS

## 2019-05-10 MED ORDER — SODIUM CHLORIDE 0.9% FLUSH: 3.0000 mL | Freq: Once | INTRAVENOUS | Status: DC

## 2019-05-10 NOTE — ED Triage Notes (Signed)
Pt presents via POV c/o "heart racing" HR noted to be 190s in triage. Taken to ED room 8. Pt reports hx of same. Started this am at 0600. Ambulatory to triage. Reports chest pain.

## 2019-05-10 NOTE — ED Triage Notes (Signed)
MD at bedside. 

## 2019-05-10 NOTE — ED Notes (Signed)
Pt may eat and drink per EDP. Pt given juice and crackers, tolerating well

## 2019-05-10 NOTE — ED Notes (Signed)
Pt ambulatory to toilet with steady gait noted.  

## 2019-05-10 NOTE — ED Provider Notes (Signed)
[facility] Emergency Department Provider Note ____________________________________________   First MD Initiated Contact with Patient 05/10/19 [phone]     (approximate)  I have reviewed the triage vital signs and the nursing notes.   HISTORY  Chief Complaint Tachycardia    HPI Heather Choi is a 58 y.o. female with PMH as noted below who presents with palpitations, acute onset when she awoke this morning around 6 AM.  She states that this has happened before.  She reports some pressure in her chest and feels lightheaded.  She denies significant shortness of breath although she has had some increased shortness of breath recently.  The patient denies lower extremity swelling but does state that she has swelling in her abdomen, which is where she tends to get edema.  Past Medical History:  Diagnosis Date  . Asthma   . Bell's palsy    fingers tingling in cold weather  . CHF (congestive heart failure) (HCC)   . COPD (chronic obstructive pulmonary disease) (HCC)   . Enlarged heart   . Hypertension   . Thyroid disease     Patient Active Problem List   Diagnosis Date Noted  . Acute on chronic systolic CHF (congestive heart failure) (HCC) 01/20/2019  . COPD (chronic obstructive pulmonary disease) (HCC) 01/20/2019  . HTN (hypertension) 01/20/2019  . Hypothyroidism 01/20/2019  . Anxiety 01/20/2019  . Pelvic mass 12/26/2017  . Heart failure (HCC) 10/31/2017  . Chest pain 11/11/2016    Past Surgical History:  Procedure Laterality Date  . ABDOMINAL HYSTERECTOMY    . CHOLECYSTECTOMY    . IMPLANTABLE CARDIOVERTER DEFIBRILLATOR IMPLANT    . laparoscopic bilateral oophorectomy with removal of adnexal mass and lysis of adhesions  11/20/2017  . LAPAROSCOPY     with removal of adnexal structure    Prior to Admission medications   Medication Sig Start Date End Date Taking? Authorizing Provider  Albuterol Sulfate (PROAIR RESPICLICK) 108 (90 Base) MCG/ACT AEPB  Inhale 2 puffs into the lungs every 4 (four) hours as needed (shortness of breath / wheezing).    Yes [provider]  ALPRAZolam Prudy Feeler(XANAX) 0.5 MG tablet Take 0.5 mg by mouth at bedtime as needed for anxiety.    Yes [provider]  aspirin EC 81 MG tablet Take 81 mg by mouth daily.    Yes [provider]  carvedilol (COREG) 25 MG tablet Take 12.5 mg by mouth 2 (two) times daily. 01/31/19  Yes [provider]  cetirizine (ZYRTEC) 10 MG tablet Take 10 mg by mouth daily.   Yes [provider]  digoxin (LANOXIN) 0.125 MG tablet Take 0.125 mg by mouth daily.   Yes [provider]  fluticasone (FLONASE) 50 MCG/ACT nasal spray Place 2 sprays into both nostrils daily.   Yes [provider]  furosemide (LASIX) 40 MG tablet Take 80 mg by mouth daily. 03/17/19  Yes [provider]  levothyroxine (SYNTHROID) 137 MCG tablet Take 137 mcg by mouth daily.  03/05/14  Yes [provider]  Liniments (SALONPAS PAIN RELIEF PATCH EX) Apply 1 patch topically daily as needed (pain).   Yes [provider]  lisinopril (ZESTRIL) 5 MG tablet Take 5 mg by mouth daily.   Yes [provider]  metolazone (ZAROXOLYN) 5 MG tablet Take 5 mg by mouth See admin instructions. Take 1 tablet (5mg ) by mouth once daily - take a 1 additional tablet (5mg ) daily on Tuesday and Saturday afternoons 02/06/19 02/06/20 Yes [provider]  potassium chloride SA (K-DUR,KLOR-CON) 20 MEQ tablet Take 20 mEq by mouth 2 (two) times daily.  04/29/15  Yes [provider]  spironolactone (ALDACTONE) 25 MG tablet Take 25 mg by mouth daily.  04/29/15  Yes [provider]    Allergies Amlodipine, Penicillins, and Hydrochlorothiazide  Family History  Problem Relation Age of Onset  . Heart failure Father   . Heart attack Mother     Social History Social History   Tobacco Use  . Smoking status: Current Every Day Smoker    Packs/day: 0.50     Types: Cigarettes  . Smokeless tobacco: Never Used  Substance Use Topics  . Alcohol use: Yes    Comment: rarely  . Drug use: No    Review of Systems  Constitutional: No fever. Eyes: No redness. ENT: No sore throat. Cardiovascular: Positive for chest pain. Respiratory: Denies shortness of breath. Gastrointestinal: Positive for nausea. Genitourinary: Negative for dysuria.  Musculoskeletal: Negative for back pain. Skin: Negative for rash. Neurological: Negative for headache.   ____________________________________________   PHYSICAL EXAM:  VITAL SIGNS: ED Triage Vitals  Enc Vitals Group     BP 05/10/19 0736 98/66     Pulse Rate 05/10/19 0736 (!) 187     Resp 05/10/19 0736 20     Temp --      Temp src --      SpO2 --      Weight 05/10/19 0735 160 lb (72.6 kg)     Height --      Head Circumference --      Peak Flow --      Pain Score 05/10/19 0734 8     Pain Loc --      Pain Edu? --      Excl. in [place]? --     Constitutional: Alert and oriented.  Uncomfortable but relatively well-appearing. Eyes: Conjunctivae are normal.  Head: Atraumatic. Nose: No congestion/rhinnorhea. Mouth/Throat: Mucous membranes are moist.   Neck: Normal range of motion.  Cardiovascular: Tachycardic, regular rhythm. Grossly normal heart sounds.  Good peripheral circulation. Respiratory: Normal respiratory effort.  No retractions. Lungs CTAB. Gastrointestinal: Soft and nontender. No significant distention.  Genitourinary: No flank tenderness. Musculoskeletal: No lower extremity edema.  Extremities warm and well perfused.  Neurologic:  Normal speech and language. No gross focal neurologic deficits are appreciated.  Skin:  Skin is warm and dry. No rash noted. Psychiatric: Mood and affect are normal. Speech and behavior are normal.  ____________________________________________   LABS (all labs ordered are listed, but only abnormal results are displayed)  Labs Reviewed  BASIC METABOLIC  PANEL - Abnormal; Notable for the following components:      Result Value   Glucose, Bld 192 (*)    Creatinine, Ser 1.28 (*)    GFR calc non Af Amer 46 (*)    GFR calc Af Amer 54 (*)    All other components within normal limits  CBC - Abnormal; Notable for the following components:   RBC 5.34 (*)    All other components within normal limits  DIGOXIN LEVEL - Abnormal; Notable for the following components:   Digoxin Level <0.2 (*)    All other components within normal limits  TROPONIN I (HIGH SENSITIVITY) - Abnormal; Notable for the following components:   Troponin I (High Sensitivity) 39 (*)    All other components within normal limits  TROPONIN I (HIGH SENSITIVITY) - Abnormal; Notable for the following components:   Troponin I (High Sensitivity) 42 (*)  All other components within normal limits  BRAIN NATRIURETIC PEPTIDE   ____________________________________________  EKG  ED ECG REPORT I, Dionne BucySebastian Shakaria Raphael, the attending physician, personally viewed and interpreted this ECG.  Date: 05/10/2019 EKG Time: 0736 Rate: 187 Rhythm: Supraventricular tachycardia QRS Axis: normal Intervals: normal ST/T Wave abnormalities: Nonspecific ST abnormalities likely rate related Narrative Interpretation: SVT   ED ECG REPORT I, Dionne BucySebastian Norely Schlick, the attending physician, personally viewed and interpreted this ECG.  Date: 05/10/2019 EKG Time: 0745 Rate: 125 Rhythm: Sinus tachycardia QRS Axis: normal Intervals: normal ST/T Wave abnormalities: LVH with repolarization abnormality Narrative Interpretation: Nonspecific abnormalities with no evidence of acute ischemia   ____________________________________________  RADIOLOGY  CXR: No focal infiltrate or other acute abnormality CT abdomen: No acute abnormality ____________________________________________   PROCEDURES  Procedure(s) performed: No  Procedures  Critical Care performed: Yes  CRITICAL CARE Performed by:  Dionne BucySebastian Armoni Kludt   Total critical care time: 20 minutes  Critical care time was exclusive of separately billable procedures and treating other patients.  Critical care was necessary to treat or prevent imminent or life-threatening deterioration.  Critical care was time spent personally by me on the following activities: development of treatment plan with patient and/or surrogate as well as nursing, discussions with consultants, evaluation of patient's response to treatment, examination of patient, obtaining history from patient or surrogate, ordering and performing treatments and interventions, ordering and review of laboratory studies, ordering and review of radiographic studies, pulse oximetry and re-evaluation of patient's condition. ____________________________________________   INITIAL IMPRESSION / ASSESSMENT AND PLAN / ED COURSE  Pertinent labs & imaging results that were available during my care of the patient were reviewed by me and considered in my medical decision making (see chart for details).  58 year old female with PMH as noted above including history of CHF presents with palpitations and tachycardia since she awoke about an hour ago.  She reports having had similar episodes in the past.  She states she has felt slightly short of breath over the last few days and has had some increased edema around her abdomen.  I reviewed the past medical records in epic.  The patient was most recently admitted in April with respiratory failure due to CHF and COPD exacerbations.  The patient was seen in the ED in October 2018 with an episode of SVT and was discharged home.  On initial exam, the heart rate was in the 180s.  The patient's blood pressure was borderline low with otherwise normal vital signs.  She appears slightly uncomfortable but in no acute distress.  The remainder of the exam is as described above.  There is no significant peripheral edema and no palpable distention or  tenderness to the abdomen.  Overall presentation is consistent with SVT.  The patient successfully returned to sinus rhythm after a single dose of adenosine.  She is now still somewhat tachycardic.  We will obtain chest x-ray and labs to evaluate for acute CHF or cardiac ischemia, and check a digoxin level.  ----------------------------------------- 1:14 PM on 05/10/2019 -----------------------------------------  Initial and repeat troponin are both around 40 with no significant rise.  The other lab work-up is unremarkable.  The patient's digoxin level is low.  The heart rate has gradually come down to the 80s and the patient's blood pressure has remained stable.  She continues to appear comfortable and is asymptomatic at this time.  The patient reports did again that she had some upper abdominal distention and swelling.  She states that it feels similar to when  she was diagnosed with a pelvic mass, and requested imaging.  Although she has no focal tenderness, I think it would be reasonable to reimage her.  I obtained a CT and it was negative.  I consulted Dr. Clayborn Bigness from cardiology and discussed the case with him.  He advises that the patient would likely be a candidate for ablation at some point and recommends that she follow-up with her cardiologist Dr. Saralyn Pilar this week.  He agrees with discharge home based on the current clinical status and work-up.  At this time, the patient is stable for discharge home.  She is eating,  tolerating p.o. without difficulty.  Return precautions given and she expressed understanding.  I instructed her to call her cardiologist this week and she agreed to do so.  ____________________________________________   FINAL CLINICAL IMPRESSION(S) / ED DIAGNOSES  Final diagnoses:  SVT (supraventricular tachycardia) ([place])      NEW MEDICATIONS STARTED DURING THIS VISIT:  New Prescriptions   No medications on file     Note:  This document was prepared  using Dragon voice recognition software and may include unintentional dictation errors.    Arta Silence, MD 05/10/19 1316

## 2019-05-10 NOTE — Discharge Instructions (Addendum)
Call your cardiologist office this week to arrange for follow-up.  You should make arrangements to have the ablation that was planned.  Return to the ER immediately for new, worsening, or persistent palpitations, chest pain, shortness breath, weakness, abdominal pain, vomiting, or any other new or worsening symptoms that concern you.

## 2019-05-10 NOTE — ED Notes (Signed)
Pt taken to CT.

## 2019-06-07 ENCOUNTER — Encounter: Payer: Self-pay | Admitting: Emergency Medicine

## 2019-06-07 ENCOUNTER — Other Ambulatory Visit: Payer: Self-pay

## 2019-06-07 ENCOUNTER — Emergency Department: Payer: BC Managed Care – PPO

## 2019-06-07 ENCOUNTER — Emergency Department
Admission: EM | Admit: 2019-06-07 | Discharge: 2019-06-07 | Disposition: A | Discharge status: Home / Self Care | Payer: BC Managed Care – PPO | Attending: Emergency Medicine | Admitting: Emergency Medicine

## 2019-06-07 DIAGNOSIS — I110 Hypertensive heart disease with heart failure: Secondary | ICD-10-CM | POA: Insufficient documentation

## 2019-06-07 DIAGNOSIS — I5022 Chronic systolic (congestive) heart failure: Secondary | ICD-10-CM | POA: Insufficient documentation

## 2019-06-07 DIAGNOSIS — R000 Tachycardia, unspecified: Secondary | ICD-10-CM | POA: Diagnosis present

## 2019-06-07 DIAGNOSIS — Z7982 Long term (current) use of aspirin: Secondary | ICD-10-CM | POA: Insufficient documentation

## 2019-06-07 DIAGNOSIS — Z79899 Other long term (current) drug therapy: Secondary | ICD-10-CM | POA: Diagnosis not present

## 2019-06-07 DIAGNOSIS — F17210 Nicotine dependence, cigarettes, uncomplicated: Secondary | ICD-10-CM | POA: Insufficient documentation

## 2019-06-07 DIAGNOSIS — I471 Supraventricular tachycardia: Secondary | ICD-10-CM | POA: Diagnosis not present

## 2019-06-07 DIAGNOSIS — J45909 Unspecified asthma, uncomplicated: Secondary | ICD-10-CM | POA: Insufficient documentation

## 2019-06-07 DIAGNOSIS — J449 Chronic obstructive pulmonary disease, unspecified: Secondary | ICD-10-CM | POA: Diagnosis not present

## 2019-06-07 DIAGNOSIS — E039 Hypothyroidism, unspecified: Secondary | ICD-10-CM | POA: Diagnosis not present

## 2019-06-07 LAB — CBC WITH DIFFERENTIAL/PLATELET
Abs Immature Granulocytes: 0.03 10*3/uL (ref 0.00–0.07)
Basophils Absolute: 0 10*3/uL (ref 0.0–0.1)
Basophils Relative: 0 %
Eosinophils Absolute: 0.3 10*3/uL (ref 0.0–0.5)
Eosinophils Relative: 3 %
HCT: 43.7 % (ref 36.0–46.0)
Hemoglobin: 14 g/dL (ref 12.0–15.0)
Immature Granulocytes: 0 %
Lymphocytes Relative: 27 %
Lymphs Abs: 2.9 10*3/uL (ref 0.7–4.0)
MCH: 27.2 pg (ref 26.0–34.0)
MCHC: 32 g/dL (ref 30.0–36.0)
MCV: 85 fL (ref 80.0–100.0)
Monocytes Absolute: 0.7 10*3/uL (ref 0.1–1.0)
Monocytes Relative: 6 %
Neutro Abs: 6.9 10*3/uL (ref 1.7–7.7)
Neutrophils Relative %: 64 %
Platelets: 167 10*3/uL (ref 150–400)
RBC: 5.14 MIL/uL — ABNORMAL HIGH (ref 3.87–5.11)
RDW: 14.7 % (ref 11.5–15.5)
WBC: 10.9 10*3/uL — ABNORMAL HIGH (ref 4.0–10.5)
nRBC: 0 % (ref 0.0–0.2)

## 2019-06-07 LAB — COMPREHENSIVE METABOLIC PANEL
ALT: 16 U/L (ref 0–44)
AST: 24 U/L (ref 15–41)
Albumin: 4.3 g/dL (ref 3.5–5.0)
Alkaline Phosphatase: 77 U/L (ref 38–126)
Anion gap: 11 (ref 5–15)
BUN: 14 mg/dL (ref 6–20)
CO2: 25 mmol/L (ref 22–32)
Calcium: 9.3 mg/dL (ref 8.9–10.3)
Chloride: 104 mmol/L (ref 98–111)
Creatinine, Ser: 1.15 mg/dL — ABNORMAL HIGH (ref 0.44–1.00)
GFR calc Af Amer: >60 mL/min (ref >60)
GFR calc non Af Amer: 53 mL/min — ABNORMAL LOW (ref >60)
Glucose, Bld: 202 mg/dL — ABNORMAL HIGH (ref 70–99)
Potassium: 4.4 mmol/L (ref 3.5–5.1)
Sodium: 140 mmol/L (ref 135–145)
Total Bilirubin: 0.8 mg/dL (ref 0.3–1.2)
Total Protein: 7.6 g/dL (ref 6.5–8.1)

## 2019-06-07 LAB — DIGOXIN LEVEL: Digoxin Level: 0.3 ng/mL — ABNORMAL LOW (ref 0.8–2.0)

## 2019-06-07 LAB — TSH: TSH: 2.309 u[IU]/mL (ref 0.350–4.500)

## 2019-06-07 LAB — TROPONIN I (HIGH SENSITIVITY): Troponin I (High Sensitivity): 38 ng/L — ABNORMAL HIGH (ref <18)

## 2019-06-07 MED ORDER — ADENOSINE 6 MG/2ML IV SOLN
6.0000 mg | Freq: Once | INTRAVENOUS | Status: AC
  Administered 2019-06-07: 15:00:00 6 mg via INTRAVENOUS

## 2019-06-07 MED ORDER — ADENOSINE 6 MG/2ML IV SOLN
INTRAVENOUS | Status: AC
  Administered 2019-06-07: 6 mg via INTRAVENOUS
  Filled 2019-06-07: qty 2

## 2019-06-07 MED ORDER — ADENOSINE 12 MG/4ML IV SOLN
INTRAVENOUS | Status: AC
  Filled 2019-06-07: qty 4

## 2019-06-07 MED ORDER — CARVEDILOL 25 MG PO TABS
25.0000 mg | ORAL_TABLET | Freq: Two times a day (BID) | ORAL | Status: DC
  Administered 2019-06-07: 25 mg via ORAL
  Filled 2019-06-07: qty 1

## 2019-06-07 MED ORDER — DILTIAZEM HCL 25 MG/5ML IV SOLN
INTRAVENOUS | Status: AC
  Administered 2019-06-07: 25 mg
  Filled 2019-06-07: qty 5

## 2019-06-07 NOTE — ED Provider Notes (Addendum)
[facility] Emergency Department Provider Note   ____________________________________________   First MD Initiated Contact with Patient 06/07/19 1458     (approximate)  I have reviewed the triage vital signs and the nursing notes.   HISTORY  Chief Complaint Tachycardia   HPI Heather Choi is a 58 y.o. female who reports she missed her carvedilol last night and again this morning.  She comes in in SVT with a heart rate of 180.  She is done this before.  This really spotted to adenosine in the past.  In the emergency room grunting and blowing through an occluded straw slow the heart rate down to only 166.  It immediately goes back up again.  Adenosine brings it down to sinus rhythm patient then got some IV Cardizem and heart rate came down into the 80s.  We will get some lab work and restart her carvedilol.         Past Medical History:  Diagnosis Date  . Asthma   . Bell's palsy    fingers tingling in cold weather  . CHF (congestive heart failure) (HCC)   . COPD (chronic obstructive pulmonary disease) (HCC)   . Enlarged heart   . Hypertension   . Thyroid disease     Patient Active Problem List   Diagnosis Date Noted  . Acute on chronic systolic CHF (congestive heart failure) (HCC) 01/20/2019  . COPD (chronic obstructive pulmonary disease) (HCC) 01/20/2019  . HTN (hypertension) 01/20/2019  . Hypothyroidism 01/20/2019  . Anxiety 01/20/2019  . Pelvic mass 12/26/2017  . Heart failure (HCC) 10/31/2017  . Chest pain 11/11/2016    Past Surgical History:  Procedure Laterality Date  . ABDOMINAL HYSTERECTOMY    . CHOLECYSTECTOMY    . IMPLANTABLE CARDIOVERTER DEFIBRILLATOR IMPLANT    . laparoscopic bilateral oophorectomy with removal of adnexal mass and lysis of adhesions  11/20/2017  . LAPAROSCOPY     with removal of adnexal structure    Prior to Admission medications   Medication Sig Start Date End Date Taking? Authorizing Provider   Albuterol Sulfate (PROAIR RESPICLICK) 108 (90 Base) MCG/ACT AEPB Inhale 2 puffs into the lungs every 4 (four) hours as needed (shortness of breath / wheezing).     [provider]  ALPRAZolam Prudy Feeler) 0.5 MG tablet Take 0.5 mg by mouth at bedtime as needed for anxiety.     [provider]  aspirin EC 81 MG tablet Take 81 mg by mouth daily.     [provider]  carvedilol (COREG) 25 MG tablet Take 12.5 mg by mouth 2 (two) times daily. 01/31/19   [provider]  cetirizine (ZYRTEC) 10 MG tablet Take 10 mg by mouth daily.    [provider]  digoxin (LANOXIN) 0.125 MG tablet Take 0.125 mg by mouth daily.    [provider]  fluticasone (FLONASE) 50 MCG/ACT nasal spray Place 2 sprays into both nostrils daily.    [provider]  furosemide (LASIX) 40 MG tablet Take 80 mg by mouth daily. 03/17/19   [provider]  levothyroxine (SYNTHROID) 137 MCG tablet Take 137 mcg by mouth daily.  03/05/14   [provider]  Liniments (SALONPAS PAIN RELIEF PATCH EX) Apply 1 patch topically daily as needed (pain).    [provider]  lisinopril (ZESTRIL) 5 MG tablet Take 5 mg by mouth daily.    [provider]  metolazone (ZAROXOLYN) 5 MG tablet Take 5 mg by mouth See admin instructions. Take  1 tablet (5mg ) by mouth once daily - take a 1 additional tablet (5mg ) daily on Tuesday and Saturday afternoons 02/06/19 02/06/20  [provider]  potassium chloride SA (K-DUR,KLOR-CON) 20 MEQ tablet Take 20 mEq by mouth 2 (two) times daily.  04/29/15   [provider]  spironolactone (ALDACTONE) 25 MG tablet Take 25 mg by mouth daily.  04/29/15   [provider]    Allergies Amlodipine, Penicillins, and Hydrochlorothiazide  Family History  Problem Relation Age of Onset  . Heart failure Father   . Heart attack Mother     Social History Social History   Tobacco Use  . Smoking status: Current Every Day  Smoker    Packs/day: 0.50    Types: Cigarettes  . Smokeless tobacco: Never Used  Substance Use Topics  . Alcohol use: Yes    Comment: rarely  . Drug use: No    Review of Systems  Constitutional: No fever/chills Eyes: No visual changes. ENT: No sore throat. Cardiovascular: Denies chest pain. Respiratory: Some shortness of breath. Gastrointestinal: No abdominal pain.  No nausea, no vomiting.  No diarrhea.  No constipation. Genitourinary: Negative for dysuria. Musculoskeletal: Negative for back pain. Skin: Negative for rash. Neurological: Negative for headaches, focal weakness   ____________________________________________   PHYSICAL EXAM:  VITAL SIGNS: ED Triage Vitals  Enc Vitals Group     BP 06/07/19 1442 (!) 118/47     Pulse Rate 06/07/19 1440 (!) 180     Resp 06/07/19 1440 18     Temp 06/07/19 1440 98.1 F (36.7 C)     Temp src --      SpO2 06/07/19 1440 95 %     Weight 06/07/19 1441 163 lb (73.9 kg)     Height 06/07/19 1441 5\' 2"  (1.575 m)     Head Circumference --      Peak Flow --      Pain Score 06/07/19 1440 6     Pain Loc --      Pain Edu? --      Excl. in [place]? --     Constitutional: Alert and oriented. Well appearing and in no acute distress! Eyes: Conjunctivae are normal.  Head: Atraumatic. Nose: No congestion/rhinnorhea. Mouth/Throat: Mucous membranes are moist.  Oropharynx non-erythematous. Neck: No stridor.  Cardiovascular: Rapid rate, regular rhythm. Grossly normal heart sounds.  Good peripheral circulation. Respiratory: Normal respiratory effort.  No retractions. Lungs CTAB. Gastrointestinal: Soft and nontender. No distention. No abdominal bruits. No CVA tenderness. Musculoskeletal: No lower extremity tenderness nor edema.   Neurologic:  Normal speech and language. No gross focal neurologic deficits are appreciated.  Skin:  Skin is warm, dry and intact. No rash noted.   ____________________________________________   LABS (all labs ordered  are listed, but only abnormal results are displayed)  Labs Reviewed  COMPREHENSIVE METABOLIC PANEL - Abnormal; Notable for the following components:      Result Value   Glucose, Bld 202 (*)    Creatinine, Ser 1.15 (*)    GFR calc non Af Amer 53 (*)    All other components within normal limits  CBC WITH DIFFERENTIAL/PLATELET - Abnormal; Notable for the following components:   WBC 10.9 (*)    RBC 5.14 (*)    All other components within normal limits  DIGOXIN LEVEL - Abnormal; Notable for the following components:   Digoxin Level 0.3 (*)    All other components within normal limits  TROPONIN I (HIGH SENSITIVITY) - Abnormal; Notable for the following  components:   Troponin I (High Sensitivity) 38 (*)    All other components within normal limits  TSH   ____________________________________________  EKG EKG read interpreted by me shows SVT at a rate of 182 with rate related changes normal axis  _EKG done after adenosine shows conversion from SVT to what turns into sinus rhythm later.  ___________________________________________  RADIOLOGY  ED MD interpretation: Chest x-ray read by radiology reviewed by me shows cardiomegaly with mild pulmonary vascular congestion.  Please note this is immediately after she was chemically cardioverted.  Official radiology report(s): Dg Chest Portable 1 View  Result Date: 06/07/2019 CLINICAL DATA:  Hypoxia. Supraventricular tachycardia. Smoker. EXAM: PORTABLE CHEST 1 VIEW COMPARISON:  05/10/2019 FINDINGS: Stable enlarged cardiac silhouette and mildly prominent pulmonary vasculature. Stable left subclavian pacer and AICD leads. Thoracic spine degenerative changes. IMPRESSION: Stable cardiomegaly and mild pulmonary vascular congestion. Electronically Signed   By: Beckie SaltsSteven  Reid M.D.   On: 06/07/2019 15:16    ____________________________________________   PROCEDURES  Procedure(s) performed (including Critical Care):  Procedures    ____________________________________________   INITIAL IMPRESSION / ASSESSMENT AND PLAN / ED COURSE  Patient doing well.  In sinus rhythm I will let her go.  Troponin elevation is likely from strain from the heart rate of 180.  She can follow-up with her cardiologist.     Patient's blood pressure has dropped.  She reports she frequently has blood pressures in the 80s and does well she wants to go home she is feeling well she can get up and walk around without any difficulty I will let her do so.         ____________________________________________   FINAL CLINICAL IMPRESSION(S) / ED DIAGNOSES  Final diagnoses:  SVT (supraventricular tachycardia) [facility](HCC)     ED Discharge Orders    None       Note:  This document was prepared using Dragon voice recognition software and may include unintentional dictation errors.    Arnaldo NatalMalinda, Cecely Rengel F, MD 06/07/19 1558    Arnaldo NatalMalinda, Keirsten Matuska F, MD 06/07/19 [phone]

## 2019-06-07 NOTE — ED Notes (Signed)
Pt's bp 89/57 with no dizziness or weakness.  Rechecked and still low. Fluids from the svt conversion re-hooked up and flowing in.

## 2019-06-07 NOTE — Discharge Instructions (Signed)
Please follow-up with Dr. Laverle Patter shows.  Give him a call Monday morning let him know that you had SVT at 180.  Please keep all your other appointments.  Return here for any further problems at all.

## 2019-06-07 NOTE — ED Triage Notes (Signed)
Pt missed carvedilol last night. Has defib - also hx svt with schedule for ablation on 06/13/19.

## 2019-07-15 ENCOUNTER — Other Ambulatory Visit: Payer: Self-pay

## 2019-07-15 DIAGNOSIS — Z20822 Contact with and (suspected) exposure to covid-19: Secondary | ICD-10-CM

## 2019-07-17 LAB — NOVEL CORONAVIRUS, NAA: SARS-CoV-2, NAA: NOT DETECTED

## 2019-07-22 DIAGNOSIS — N183 Chronic kidney disease, stage 3 unspecified: Secondary | ICD-10-CM | POA: Insufficient documentation

## 2019-12-23 ENCOUNTER — Other Ambulatory Visit: Payer: Self-pay

## 2019-12-23 ENCOUNTER — Emergency Department: Payer: BC Managed Care – PPO

## 2019-12-23 ENCOUNTER — Inpatient Hospital Stay
Admission: EM | Admit: 2019-12-23 | Discharge: 2019-12-28 | DRG: 638 | Disposition: A | Discharge status: Home / Self Care | Payer: BC Managed Care – PPO | Attending: Internal Medicine | Admitting: Internal Medicine

## 2019-12-23 DIAGNOSIS — F17210 Nicotine dependence, cigarettes, uncomplicated: Secondary | ICD-10-CM | POA: Diagnosis present

## 2019-12-23 DIAGNOSIS — Z880 Allergy status to penicillin: Secondary | ICD-10-CM | POA: Diagnosis not present

## 2019-12-23 DIAGNOSIS — E039 Hypothyroidism, unspecified: Secondary | ICD-10-CM | POA: Diagnosis present

## 2019-12-23 DIAGNOSIS — E079 Disorder of thyroid, unspecified: Secondary | ICD-10-CM | POA: Diagnosis present

## 2019-12-23 DIAGNOSIS — Z90710 Acquired absence of both cervix and uterus: Secondary | ICD-10-CM | POA: Diagnosis not present

## 2019-12-23 DIAGNOSIS — E860 Dehydration: Secondary | ICD-10-CM | POA: Diagnosis present

## 2019-12-23 DIAGNOSIS — Z9049 Acquired absence of other specified parts of digestive tract: Secondary | ICD-10-CM | POA: Diagnosis not present

## 2019-12-23 DIAGNOSIS — E119 Type 2 diabetes mellitus without complications: Secondary | ICD-10-CM | POA: Diagnosis not present

## 2019-12-23 DIAGNOSIS — Z20822 Contact with and (suspected) exposure to covid-19: Secondary | ICD-10-CM | POA: Diagnosis present

## 2019-12-23 DIAGNOSIS — I10 Essential (primary) hypertension: Secondary | ICD-10-CM | POA: Diagnosis not present

## 2019-12-23 DIAGNOSIS — Z79890 Hormone replacement therapy (postmenopausal): Secondary | ICD-10-CM

## 2019-12-23 DIAGNOSIS — H1031 Unspecified acute conjunctivitis, right eye: Secondary | ICD-10-CM | POA: Diagnosis present

## 2019-12-23 DIAGNOSIS — N179 Acute kidney failure, unspecified: Secondary | ICD-10-CM | POA: Diagnosis present

## 2019-12-23 DIAGNOSIS — J9811 Atelectasis: Secondary | ICD-10-CM | POA: Diagnosis present

## 2019-12-23 DIAGNOSIS — Z794 Long term (current) use of insulin: Secondary | ICD-10-CM

## 2019-12-23 DIAGNOSIS — J441 Chronic obstructive pulmonary disease with (acute) exacerbation: Secondary | ICD-10-CM | POA: Diagnosis present

## 2019-12-23 DIAGNOSIS — Z833 Family history of diabetes mellitus: Secondary | ICD-10-CM | POA: Diagnosis not present

## 2019-12-23 DIAGNOSIS — J449 Chronic obstructive pulmonary disease, unspecified: Secondary | ICD-10-CM | POA: Diagnosis present

## 2019-12-23 DIAGNOSIS — I110 Hypertensive heart disease with heart failure: Secondary | ICD-10-CM | POA: Diagnosis present

## 2019-12-23 DIAGNOSIS — G510 Bell's palsy: Secondary | ICD-10-CM | POA: Diagnosis present

## 2019-12-23 DIAGNOSIS — R0602 Shortness of breath: Secondary | ICD-10-CM | POA: Diagnosis present

## 2019-12-23 DIAGNOSIS — Z79899 Other long term (current) drug therapy: Secondary | ICD-10-CM

## 2019-12-23 DIAGNOSIS — Z7982 Long term (current) use of aspirin: Secondary | ICD-10-CM | POA: Diagnosis not present

## 2019-12-23 DIAGNOSIS — Z888 Allergy status to other drugs, medicaments and biological substances status: Secondary | ICD-10-CM | POA: Diagnosis not present

## 2019-12-23 DIAGNOSIS — E1165 Type 2 diabetes mellitus with hyperglycemia: Secondary | ICD-10-CM

## 2019-12-23 DIAGNOSIS — Z8249 Family history of ischemic heart disease and other diseases of the circulatory system: Secondary | ICD-10-CM | POA: Diagnosis not present

## 2019-12-23 DIAGNOSIS — E1100 Type 2 diabetes mellitus with hyperosmolarity without nonketotic hyperglycemic-hyperosmolar coma (NKHHC): Principal | ICD-10-CM

## 2019-12-23 DIAGNOSIS — I5022 Chronic systolic (congestive) heart failure: Secondary | ICD-10-CM | POA: Diagnosis present

## 2019-12-23 DIAGNOSIS — R739 Hyperglycemia, unspecified: Secondary | ICD-10-CM

## 2019-12-23 DIAGNOSIS — H109 Unspecified conjunctivitis: Secondary | ICD-10-CM

## 2019-12-23 LAB — CBC
HCT: 45.2 % (ref 36.0–46.0)
Hemoglobin: 14.1 g/dL (ref 12.0–15.0)
MCH: 27.9 pg (ref 26.0–34.0)
MCHC: 31.2 g/dL (ref 30.0–36.0)
MCV: 89.5 fL (ref 80.0–100.0)
Platelets: 134 10*3/uL — ABNORMAL LOW (ref 150–400)
RBC: 5.05 MIL/uL (ref 3.87–5.11)
RDW: 14.6 % (ref 11.5–15.5)
WBC: 7.4 10*3/uL (ref 4.0–10.5)
nRBC: 0 % (ref 0.0–0.2)

## 2019-12-23 LAB — OSMOLALITY: Osmolality: 306 mOsm/kg — ABNORMAL HIGH (ref 275–295)

## 2019-12-23 LAB — BASIC METABOLIC PANEL
Anion gap: 14 (ref 5–15)
Anion gap: 14 (ref 5–15)
BUN: 34 mg/dL — ABNORMAL HIGH (ref 6–20)
BUN: 35 mg/dL — ABNORMAL HIGH (ref 6–20)
CO2: 27 mmol/L (ref 22–32)
CO2: 30 mmol/L (ref 22–32)
Calcium: 9.1 mg/dL (ref 8.9–10.3)
Calcium: 10.2 mg/dL (ref 8.9–10.3)
Chloride: 78 mmol/L — ABNORMAL LOW (ref 98–111)
Chloride: 93 mmol/L — ABNORMAL LOW (ref 98–111)
Creatinine, Ser: 1.71 mg/dL — ABNORMAL HIGH (ref 0.44–1.00)
Creatinine, Ser: 1.73 mg/dL — ABNORMAL HIGH (ref 0.44–1.00)
GFR calc Af Amer: 38 mL/min — ABNORMAL LOW (ref >60)
GFR calc Af Amer: 37 mL/min — ABNORMAL LOW (ref >60)
GFR calc non Af Amer: 33 mL/min — ABNORMAL LOW (ref >60)
GFR calc non Af Amer: 32 mL/min — ABNORMAL LOW (ref >60)
Glucose, Bld: 1185 mg/dL (ref 70–99)
Glucose, Bld: 209 mg/dL — ABNORMAL HIGH (ref 70–99)
Potassium: 4 mmol/L (ref 3.5–5.1)
Potassium: 3.1 mmol/L — ABNORMAL LOW (ref 3.5–5.1)
Sodium: 119 mmol/L — CL (ref 135–145)
Sodium: 137 mmol/L (ref 135–145)

## 2019-12-23 LAB — HEPATIC FUNCTION PANEL
ALT: 13 U/L (ref 0–44)
AST: 15 U/L (ref 15–41)
Albumin: 4 g/dL (ref 3.5–5.0)
Alkaline Phosphatase: 114 U/L (ref 38–126)
Bilirubin, Direct: <0.1 mg/dL (ref 0.0–0.2)
Total Bilirubin: 0.7 mg/dL (ref 0.3–1.2)
Total Protein: 7.7 g/dL (ref 6.5–8.1)

## 2019-12-23 LAB — TROPONIN I (HIGH SENSITIVITY)
Troponin I (High Sensitivity): 26 ng/L — ABNORMAL HIGH (ref <18)
Troponin I (High Sensitivity): 26 ng/L — ABNORMAL HIGH (ref <18)

## 2019-12-23 LAB — RESPIRATORY PANEL BY RT PCR (FLU A&B, COVID)
Influenza A by PCR: NEGATIVE
Influenza B by PCR: NEGATIVE
SARS Coronavirus 2 by RT PCR: NEGATIVE

## 2019-12-23 LAB — GLUCOSE, CAPILLARY
Glucose-Capillary: >600 mg/dL (ref 70–99)
Glucose-Capillary: >600 mg/dL (ref 70–99)
Glucose-Capillary: 215 mg/dL — ABNORMAL HIGH (ref 70–99)

## 2019-12-23 LAB — LIPASE, BLOOD: Lipase: 89 U/L — ABNORMAL HIGH (ref 11–51)

## 2019-12-23 LAB — BRAIN NATRIURETIC PEPTIDE: B Natriuretic Peptide: 110 pg/mL — ABNORMAL HIGH (ref 0.0–100.0)

## 2019-12-23 MED ORDER — SODIUM CHLORIDE 0.9 % IV BOLUS: 1000.0000 mL | Freq: Once | INTRAVENOUS | Status: DC

## 2019-12-23 MED ORDER — SODIUM CHLORIDE 0.9 % IV SOLN: INTRAVENOUS | Status: DC

## 2019-12-23 MED ORDER — SODIUM CHLORIDE 0.9% FLUSH: 3.0000 mL | Freq: Once | INTRAVENOUS | Status: DC

## 2019-12-23 MED ORDER — LACTATED RINGERS IV BOLUS: 1000.0000 mL | Freq: Once | INTRAVENOUS | Status: DC

## 2019-12-23 MED ORDER — LACTATED RINGERS IV SOLN: INTRAVENOUS | Status: DC

## 2019-12-23 MED ORDER — POTASSIUM CHLORIDE 10 MEQ/100ML IV SOLN
10.0000 meq | Freq: Once | INTRAVENOUS | Status: DC
  Administered 2019-12-23: 10 meq via INTRAVENOUS
  Filled 2019-12-23: qty 100

## 2019-12-23 MED ORDER — DEXTROSE-NACL 5-0.45 % IV SOLN: INTRAVENOUS | Status: DC

## 2019-12-23 MED ORDER — CARVEDILOL 3.125 MG PO TABS
12.5000 mg | ORAL_TABLET | Freq: Two times a day (BID) | ORAL | Status: DC
  Administered 2019-12-24 – 2019-12-28 (×10): 12.5 mg via ORAL
  Filled 2019-12-23 (×8): qty 4
  Filled 2019-12-23: qty 2
  Filled 2019-12-23: qty 4

## 2019-12-23 MED ORDER — TRAMADOL HCL 50 MG PO TABS
50.0000 mg | ORAL_TABLET | Freq: Three times a day (TID) | ORAL | Status: DC | PRN
  Administered 2019-12-23 – 2019-12-27 (×5): 50 mg via ORAL
  Filled 2019-12-23 (×5): qty 1

## 2019-12-23 MED ORDER — ENOXAPARIN SODIUM 40 MG/0.4ML ~~LOC~~ SOLN
40.0000 mg | SUBCUTANEOUS | Status: DC
  Administered 2019-12-24 – 2019-12-27 (×4): 40 mg via SUBCUTANEOUS
  Filled 2019-12-23 (×5): qty 0.4

## 2019-12-23 MED ORDER — POLYMYXIN B-TRIMETHOPRIM 10000-0.1 UNIT/ML-% OP SOLN
2.0000 [drp] | Freq: Four times a day (QID) | OPHTHALMIC | Status: DC
  Administered 2019-12-23 – 2019-12-28 (×19): 2 [drp] via OPHTHALMIC
  Filled 2019-12-23: qty 10

## 2019-12-23 MED ORDER — ALBUTEROL SULFATE (2.5 MG/3ML) 0.083% IN NEBU: 2.5000 mg | INHALATION_SOLUTION | RESPIRATORY_TRACT | Status: DC | PRN

## 2019-12-23 MED ORDER — INSULIN REGULAR(HUMAN) IN NACL 100-0.9 UT/100ML-% IV SOLN
INTRAVENOUS | Status: DC
  Administered 2019-12-23: 10 [IU]/h via INTRAVENOUS
  Filled 2019-12-23: qty 100

## 2019-12-23 MED ORDER — ALPRAZOLAM 0.5 MG PO TABS
0.5000 mg | ORAL_TABLET | Freq: Every evening | ORAL | Status: DC | PRN
  Administered 2019-12-24 – 2019-12-27 (×4): 0.5 mg via ORAL
  Filled 2019-12-23 (×4): qty 1

## 2019-12-23 MED ORDER — POTASSIUM CHLORIDE 10 MEQ/100ML IV SOLN
10.0000 meq | INTRAVENOUS | Status: AC
  Administered 2019-12-23: 10 meq via INTRAVENOUS
  Filled 2019-12-23 (×2): qty 100

## 2019-12-23 MED ORDER — INSULIN REGULAR(HUMAN) IN NACL 100-0.9 UT/100ML-% IV SOLN
INTRAVENOUS | Status: DC
  Filled 2019-12-23: qty 100

## 2019-12-23 MED ORDER — LEVOTHYROXINE SODIUM 137 MCG PO TABS
137.0000 ug | ORAL_TABLET | Freq: Every day | ORAL | Status: DC
  Administered 2019-12-24 – 2019-12-28 (×5): 137 ug via ORAL
  Filled 2019-12-23 (×7): qty 1

## 2019-12-23 MED ORDER — DEXTROSE 50 % IV SOLN: 0.0000 mL | INTRAVENOUS | Status: DC | PRN

## 2019-12-23 NOTE — ED Notes (Signed)
Pt taken to CT.

## 2019-12-23 NOTE — H&P (Signed)
History and Physical    [name] YJE:[id number] DOB: 1961/04/06 DOA: 12/23/2019  PCP: Kandyce Rud, MD   Patient coming from: home I have personally briefly reviewed patient's old medical records in [facility]  Chief Complaint: Shortness of breath, abdominal distention, generalized weakness, right eye discomfort  HPI: Heather Choi is a 59 y.o. female with medical history significant for chronic systolic heart failure, last EF 25%, history of SVT status post ablation, COPD, HTN, hypothyroidism who presented to the emergency room with multiple complaints including right eye redness with discomfort, intermittent shortness of breath, feeling of abdominal fullness without pain, neurolysed weakness ongoing for the past several days.  She denied fever or chills, change in bowel habits, dysuria and denied nausea or vomiting.  ED Course: Vital signs in the emergency room were within normal limits.  Work notable for blood glucose of 1185, anion gap 14, BNP was 110, lipase 89, troponin 26>>26.  Chest x-ray showed mild right basilar atelectasis.  EKG normal sinus with no acute ST-T wave changes.  Patient was started on an insulin infusion.  No bolus administered due to the systolic heart failure with low EF of 25%.  Hospitalist consulted for admission Review of Systems: As per HPI otherwise 10 point review of systems negative.    Past Medical History:  Diagnosis Date  . Asthma   . Bell's palsy    fingers tingling in cold weather  . CHF (congestive heart failure) (HCC)   . COPD (chronic obstructive pulmonary disease) (HCC)   . Enlarged heart   . Hypertension   . Thyroid disease     Past Surgical History:  Procedure Laterality Date  . ABDOMINAL HYSTERECTOMY    . CHOLECYSTECTOMY    . IMPLANTABLE CARDIOVERTER DEFIBRILLATOR IMPLANT    . laparoscopic bilateral oophorectomy with removal of adnexal mass and lysis of adhesions  11/20/2017  . LAPAROSCOPY     with removal of adnexal  structure     reports that she has been smoking cigarettes. She has been smoking about 0.50 packs per day. She has never used smokeless tobacco. She reports current alcohol use. She reports that she does not use drugs.  Allergies  Allergen Reactions  . Amlodipine Other (See Comments) and Itching  . Penicillins Other (See Comments)    Has patient had a PCN reaction causing immediate rash, facial/tongue/throat swelling, SOB or lightheadedness with hypotension: Yes Has patient had a PCN reaction causing severe rash involving mucus membranes or skin necrosis: No Has patient had a PCN reaction that required hospitalization: No Has patient had a PCN reaction occurring within the last 10 years: Yes If all of the above answers are "NO", then may proceed with Cephalosporin use.  Marland Kitchen Hydrochlorothiazide Rash    Family History  Problem Relation Age of Onset  . Heart failure Father   . Heart attack Mother      Prior to Admission medications   Medication Sig Start Date End Date Taking? Authorizing Provider  Albuterol Sulfate (PROAIR RESPICLICK) 108 (90 Base) MCG/ACT AEPB Inhale 2 puffs into the lungs every 4 (four) hours as needed (shortness of breath / wheezing).     [provider]  ALPRAZolam Prudy Feeler) 0.5 MG tablet Take 0.5 mg by mouth at bedtime as needed for anxiety.     [provider]  aspirin EC 81 MG tablet Take 81 mg by mouth daily.     [provider]  carvedilol (COREG) 25 MG tablet Take 12.5 mg by mouth  2 (two) times daily. 01/31/19   [provider]  cetirizine (ZYRTEC) 10 MG tablet Take 10 mg by mouth daily.    [provider]  digoxin (LANOXIN) 0.125 MG tablet Take 0.125 mg by mouth daily.    [provider]  fluticasone (FLONASE) 50 MCG/ACT nasal spray Place 2 sprays into both nostrils daily.    [provider]  furosemide (LASIX) 40 MG tablet Take 80 mg by mouth daily. 03/17/19   [provider]  levothyroxine  (SYNTHROID) 137 MCG tablet Take 137 mcg by mouth daily.  03/05/14   [provider]  Liniments (SALONPAS PAIN RELIEF PATCH EX) Apply 1 patch topically daily as needed (pain).    [provider]  lisinopril (ZESTRIL) 5 MG tablet Take 5 mg by mouth daily.    [provider]  metolazone (ZAROXOLYN) 5 MG tablet Take 5 mg by mouth See admin instructions. Take 1 tablet (5mg ) by mouth once daily - take a 1 additional tablet (5mg ) daily on Tuesday and Saturday afternoons 02/06/19 02/06/20  [provider]  potassium chloride SA (K-DUR,KLOR-CON) 20 MEQ tablet Take 20 mEq by mouth 2 (two) times daily.  04/29/15   [provider]  spironolactone (ALDACTONE) 25 MG tablet Take 25 mg by mouth daily.  04/29/15   [provider]    Physical Exam: Vitals:   12/23/19 1632 12/23/19 1637 12/23/19 1839 12/23/19 1930  BP:  125/74 119/81 120/82  Pulse:  84 83 79  Resp:  18 20 20   Temp:  98.3 F (36.8 C)    SpO2:  95% 98% 95%  Weight: 68 kg     Height: 5\' 2"  (1.575 m)        Vitals:   12/23/19 1632 12/23/19 1637 12/23/19 1839 12/23/19 1930  BP:  125/74 119/81 120/82  Pulse:  84 83 79  Resp:  18 20 20   Temp:  98.3 F (36.8 C)    SpO2:  95% 98% 95%  Weight: 68 kg     Height: 5\' 2"  (1.575 m)       Constitutional: Alert and awake, oriented x3, not in any acute distress. Eyes: PERLA, EOMI, irises appear normal, anicteric sclera, mild redness right conjunctiva ENMT: external ears and nose appear normal, normal hearing             Lips appears normal, oropharynx mucosa, tongue, posterior pharynx appear normal  Neck: neck appears normal, no masses, normal ROM, no thyromegaly, no JVD  CVS: S1-S2 clear, no murmur rubs or gallops,  , no carotid bruits, pedal pulses palpable, No LE edema Respiratory:  clear to auscultation bilaterally, no wheezing, rales or rhonchi. Respiratory effort normal. No accessory muscle use.  Abdomen: soft nontender, nondistended, normal  bowel sounds, no hepatosplenomegaly, no hernias Musculoskeletal: : no cyanosis, clubbing , no contractures or atrophy Neuro: Cranial nerves II-XII intact, sensation, reflexes normal, strength Psych: judgement and insight appear normal, stable mood and affect,  Skin: no rashes or lesions or ulcers, no induration or nodules   Labs on Admission: I have personally reviewed following labs and imaging studies  CBC: Recent Labs  Lab 12/23/19 1635  WBC 7.4  HGB 14.1  HCT 45.2  MCV 89.5  PLT 854*   Basic Metabolic Panel: Recent Labs  Lab 12/23/19 1635  NA 119*  K 4.0  CL 78*  CO2 27  GLUCOSE 1,185*  BUN 34*  CREATININE 1.71*  CALCIUM 9.1   GFR: Estimated Creatinine Clearance: 32.8 mL/min (A) (by  C-G formula based on SCr of 1.71 mg/dL (H)). Liver Function Tests: Recent Labs  Lab 12/23/19 1800  AST 15  ALT 13  ALKPHOS 114  BILITOT 0.7  PROT 7.7  ALBUMIN 4.0   Recent Labs  Lab 12/23/19 1800  LIPASE 89*   No results for input(s): AMMONIA in the last 168 hours. Coagulation Profile: No results for input(s): INR, PROTIME in the last 168 hours. Cardiac Enzymes: No results for input(s): CKTOTAL, CKMB, CKMBINDEX, TROPONINI in the last 168 hours. BNP (last 3 results) No results for input(s): PROBNP in the last 8760 hours. HbA1C: No results for input(s): HGBA1C in the last 72 hours. CBG: Recent Labs  Lab 12/23/19 1949  GLUCAP >600*   Lipid Profile: No results for input(s): CHOL, HDL, LDLCALC, TRIG, CHOLHDL, LDLDIRECT in the last 72 hours. Thyroid Function Tests: No results for input(s): TSH, T4TOTAL, FREET4, T3FREE, THYROIDAB in the last 72 hours. Anemia Panel: No results for input(s): VITAMINB12, FOLATE, FERRITIN, TIBC, IRON, RETICCTPCT in the last 72 hours. Urine analysis:    Component Value Date/Time   COLORURINE YELLOW (A) 09/30/2018 1848   APPEARANCEUR CLOUDY (A) 09/30/2018 1848   APPEARANCEUR Cloudy 09/01/2014 1009   LABSPEC 1.008 09/30/2018 1848    LABSPEC 1.027 09/01/2014 1009   PHURINE 7.0 09/30/2018 1848   GLUCOSEU 150 (A) 09/30/2018 1848   GLUCOSEU Negative 09/01/2014 1009   HGBUR SMALL (A) 09/30/2018 1848   BILIRUBINUR NEGATIVE 09/30/2018 1848   BILIRUBINUR Negative 09/01/2014 1009   KETONESUR NEGATIVE 09/30/2018 1848   PROTEINUR 100 (A) 09/30/2018 1848   NITRITE NEGATIVE 09/30/2018 1848   LEUKOCYTESUR NEGATIVE 09/30/2018 1848   LEUKOCYTESUR Negative 09/01/2014 1009    Radiological Exams on Admission: CT ABDOMEN PELVIS WO CONTRAST  Result Date: 12/23/2019 CLINICAL DATA:  59 year old female with abdominal distension. EXAM: CT ABDOMEN AND PELVIS WITHOUT CONTRAST TECHNIQUE: Multidetector CT imaging of the abdomen and pelvis was performed following the standard protocol without IV contrast. COMPARISON:  CT abdomen pelvis dated 05/10/2019. FINDINGS: Evaluation of this exam is limited in the absence of intravenous contrast. Lower chest: The visualized lung bases are clear. Top-normal cardiac size. Partially visualized AICD device. No intra-abdominal free air or free fluid. Hepatobiliary: The liver is grossly unremarkable. Subcentimeter right hepatic hypodense focus is too small to characterize. No intrahepatic biliary ductal dilatation. Cholecystectomy. Pancreas: Unremarkable. No pancreatic ductal dilatation or surrounding inflammatory changes. Spleen: Normal in size without focal abnormality. Adrenals/Urinary Tract: The adrenal glands are unremarkable. There is no hydronephrosis or nephrolithiasis on either side. The visualized ureters and urinary bladder appear unremarkable. Tiny pocket of air within the bladder may be related to recent instrumentation. Clinical correlation is recommended. Stomach/Bowel: There is moderate stool throughout the colon. There is no bowel obstruction or active inflammation. The appendix is normal. Vascular/Lymphatic: Mild aortoiliac atherosclerotic disease. The IVC is grossly unremarkable. No portal venous gas.  There is no adenopathy. Reproductive: Hysterectomy. No adnexal masses. Other: None Musculoskeletal: Degenerative changes of the spine. No acute osseous pathology. IMPRESSION: No acute intra-abdominal or pelvic pathology. No bowel obstruction. Normal appendix. Electronically Signed   By: Elgie Collard M.D.   On: 12/23/2019 19:46   DG Chest 2 View  Result Date: 12/23/2019 CLINICAL DATA:  Shortness of breath EXAM: CHEST - 2 VIEW COMPARISON:  06/07/2019 FINDINGS: Defibrillator is again noted and stable. Cardiac shadow is mildly enlarged but stable. The lungs are well aerated bilaterally. Minimal right basilar atelectasis is seen. No sizable effusion or infiltrate is noted. No bony abnormality is seen.  IMPRESSION: Mild right basilar atelectasis. Electronically Signed   By: Alcide Clever M.D.   On: 12/23/2019 17:01    EKG: Independently reviewed.   Assessment/Plan    Hyperosmolar hyperglycemic state (HHS) (HCC)   New onset type 2 diabetes mellitus (HCC) -Blood sugar 1185 patient with no prior history of diabetes -Insulin infusion per Endo tool -Gentle IV hydration with Ringer's lactate at 75 mils per hour with close monitoring for signs of heart failure exacerbation.  We will continue hydration as tolerated    AKI (acute kidney injury) (HCC) -Creatinine of 1.71 up from baseline of 1.16 -Should improve with IV hydration.  Continue to monitor    Conjunctivitis, right eye -Antibiotic eyedrops to right eye    Chronic systolic CHF (congestive heart failure) (HCC) -Patient appears euvolemic -Continue digoxin, lisinopril.  Not currently on beta-blocker -Hold furosemide, spironolactone and metolazone given need for hydration and resume as appropriate    COPD (chronic obstructive pulmonary disease) (HCC) -Not acutely exacerbated -Continue home bronchodilators    HTN (hypertension) -Blood pressure stable.  Continue lisinopril.      Hypothyroidism -Continue levothyroxine    DVT prophylaxis:  Lovenox  Code Status: full code  Family Communication:  none  Disposition Plan: Back to previous home environment Consults called: none  Statusinp    Andris Baumann MD [facility]     12/23/2019, 8:57 PM

## 2019-12-23 NOTE — ED Notes (Signed)
Pt's bolus stopped per [employer]. Pt has received approximately fluid.

## 2019-12-23 NOTE — ED Notes (Signed)
Pt given phone to contact family 

## 2019-12-23 NOTE — ED Triage Notes (Addendum)
Pt comes via POV from home with c/o right eye pain. Pt states pain to eye. Pt states this started a few days ago and it hurts with light.  Pt states stinging in her chest. Pt states she has a fibrillated. Pt states cough and trouble breathing started yesterday.  Pt denies any CP. Pt also states swelling in her belly. Pt state she takes a fluid pill but has not been peeing a lot lately

## 2019-12-23 NOTE — ED Provider Notes (Signed)
[facility] Emergency Department Provider Note  ____________________________________________   First MD Initiated Contact with Patient 12/23/19 1830     (approximate)  I have reviewed the triage vital signs and the nursing notes.   HISTORY  Chief Complaint Eye Pain and Shortness of Breath    HPI Heather Choi is a 59 y.o. female with CHF with EF around 25%, COPD, SVT hypertension who comes in with shortness of breath.  Patient states that she came in with cough and difficulty breathing that started yesterday.  She denies any chest pain.  She reports decreased urination with her fluid pill.  Patient also reports some discomfort to her right eye.  Patient dates of the past 3 days she is had itching and redness to her eye and and crusting to it.  She is not been on any antibiotic ointments yet.  Patient also reports some shortness of breath and additional swelling in her abdomen.  States that she has a history of cyst rupture although she is had for cyst and uterus now removed.  Patient is concerned that she has increasing fluid in her abdomen.  She does have a little bit of tenderness with it as well.  Patient states that she is compliant with her diuretics but feels that she is not urinating as much. Shortness of breath has been intermittent, nothing makes it better, nothing makes it worse.  Patient also reports increased thirst.  No history of diabetes.          Past Medical History:  Diagnosis Date  . Asthma   . Bell's palsy    fingers tingling in cold weather  . CHF (congestive heart failure) (HCC)   . COPD (chronic obstructive pulmonary disease) (HCC)   . Enlarged heart   . Hypertension   . Thyroid disease     Patient Active Problem List   Diagnosis Date Noted  . Acute on chronic systolic CHF (congestive heart failure) (HCC) 01/20/2019  . COPD (chronic obstructive pulmonary disease) (HCC) 01/20/2019  . HTN (hypertension) 01/20/2019  .  Hypothyroidism 01/20/2019  . Anxiety 01/20/2019  . Pelvic mass 12/26/2017  . Heart failure (HCC) 10/31/2017  . Chest pain 11/11/2016    Past Surgical History:  Procedure Laterality Date  . ABDOMINAL HYSTERECTOMY    . CHOLECYSTECTOMY    . IMPLANTABLE CARDIOVERTER DEFIBRILLATOR IMPLANT    . laparoscopic bilateral oophorectomy with removal of adnexal mass and lysis of adhesions  11/20/2017  . LAPAROSCOPY     with removal of adnexal structure    Prior to Admission medications   Medication Sig Start Date End Date Taking? Authorizing Provider  Albuterol Sulfate (PROAIR RESPICLICK) 108 (90 Base) MCG/ACT AEPB Inhale 2 puffs into the lungs every 4 (four) hours as needed (shortness of breath / wheezing).     [provider]  ALPRAZolam Prudy Feeler) 0.5 MG tablet Take 0.5 mg by mouth at bedtime as needed for anxiety.     [provider]  aspirin EC 81 MG tablet Take 81 mg by mouth daily.     [provider]  carvedilol (COREG) 25 MG tablet Take 12.5 mg by mouth 2 (two) times daily. 01/31/19   [provider]  cetirizine (ZYRTEC) 10 MG tablet Take 10 mg by mouth daily.    [provider]  digoxin (LANOXIN) 0.125 MG tablet Take 0.125 mg by mouth daily.    [provider]  fluticasone (FLONASE) 50 MCG/ACT nasal spray Place 2 sprays into both nostrils  daily.    [provider]  furosemide (LASIX) 40 MG tablet Take 80 mg by mouth daily. 03/17/19   [provider]  levothyroxine (SYNTHROID) 137 MCG tablet Take 137 mcg by mouth daily.  03/05/14   [provider]  Liniments (SALONPAS PAIN RELIEF PATCH EX) Apply 1 patch topically daily as needed (pain).    [provider]  lisinopril (ZESTRIL) 5 MG tablet Take 5 mg by mouth daily.    [provider]  metolazone (ZAROXOLYN) 5 MG tablet Take 5 mg by mouth See admin instructions. Take 1 tablet (5mg ) by mouth once daily - take a 1 additional tablet (5mg ) daily on Tuesday  and Saturday afternoons 02/06/19 02/06/20  [provider]  potassium chloride SA (K-DUR,KLOR-CON) 20 MEQ tablet Take 20 mEq by mouth 2 (two) times daily.  04/29/15   [provider]  spironolactone (ALDACTONE) 25 MG tablet Take 25 mg by mouth daily.  04/29/15   [provider]    Allergies Amlodipine, Penicillins, and Hydrochlorothiazide  Family History  Problem Relation Age of Onset  . Heart failure Father   . Heart attack Mother     Social History Social History   Tobacco Use  . Smoking status: Current Every Day Smoker    Packs/day: 0.50    Types: Cigarettes  . Smokeless tobacco: Never Used  Substance Use Topics  . Alcohol use: Yes    Comment: rarely  . Drug use: No      Review of Systems Constitutional: No fever/chills Eyes: No visual changes. ENT: No sore throat.  Positive increased thirst Cardiovascular: Denies chest pain. Respiratory: Positive shortness of breath Gastrointestinal: Positive abdominal pain and abdominal swelling no nausea, no vomiting.  No diarrhea.  No constipation. Genitourinary: Negative for dysuria. Musculoskeletal: Negative for back pain. Skin: Negative for rash. Neurological: Negative for headaches, focal weakness or numbness. All other ROS negative ____________________________________________   PHYSICAL EXAM:  VITAL SIGNS: ED Triage Vitals  Enc Vitals Group     BP 12/23/19 1637 125/74     Pulse Rate 12/23/19 1637 84     Resp 12/23/19 1637 18     Temp 12/23/19 1637 98.3 F (36.8 C)     Temp src --      SpO2 12/23/19 1637 95 %     Weight 12/23/19 1632 150 lb (68 kg)     Height 12/23/19 1632 5\' 2"  (1.575 m)     Head Circumference --      Peak Flow --      Pain Score 12/23/19 1632 4     Pain Loc --      Pain Edu? --      Excl. in [place]? --     Constitutional: Alert and oriented. Well appearing and in no acute distress. Eyes: Conjunctivae are normal. EOMI. Head: Atraumatic. Nose: No  congestion/rhinnorhea. Mouth/Throat: Mucous membranes are moist.   Neck: No stridor. Trachea Midline. FROM Cardiovascular: Normal rate, regular rhythm. Grossly normal heart sounds.  Good peripheral circulation. Respiratory: Normal respiratory effort.  No retractions. Lungs CTAB. Gastrointestinal: Slightly tender abdomen.  No abdominal bruits.  Musculoskeletal: No lower extremity tenderness nor edema.  No joint effusions. Neurologic:  Normal speech and language. No gross focal neurologic deficits are appreciated.  Skin:  Skin is warm, dry and intact. No rash noted. Psychiatric: Mood and affect are normal. Speech and behavior are normal. GU: Deferred   ____________________________________________   LABS (all labs ordered are listed, but only abnormal results are displayed)  Labs Reviewed  BASIC METABOLIC PANEL - Abnormal; Notable for the following components:      Result Value   Sodium 119 (*)    Chloride 78 (*)    Glucose, Bld 1,185 (*)    BUN 34 (*)    Creatinine, Ser 1.71 (*)    GFR calc non Af Amer 33 (*)    GFR calc Af Amer 38 (*)    All other components within normal limits  CBC - Abnormal; Notable for the following components:   Platelets 134 (*)    All other components within normal limits  BRAIN NATRIURETIC PEPTIDE - Abnormal; Notable for the following components:   B Natriuretic Peptide 110.0 (*)    All other components within normal limits  LIPASE, BLOOD - Abnormal; Notable for the following components:   Lipase 89 (*)    All other components within normal limits  GLUCOSE, CAPILLARY - Abnormal; Notable for the following components:   Glucose-Capillary >600 (*)    All other components within normal limits  TROPONIN I (HIGH SENSITIVITY) - Abnormal; Notable for the following components:   Troponin I (High Sensitivity) 26 (*)    All other components within normal limits  TROPONIN I (HIGH SENSITIVITY) - Abnormal; Notable for the following components:   Troponin I (High  Sensitivity) 26 (*)    All other components within normal limits  RESPIRATORY PANEL BY RT PCR (FLU A&B, COVID)  HEPATIC FUNCTION PANEL  POC URINE PREG, ED   ____________________________________________   ED ECG REPORT I, Concha Se, the attending physician, personally viewed and interpreted this ECG.  EKG is normal sinus rate of 78, no ST elevations, T wave inversions in 1 to aVL, normal intervals ____________________________________________  RADIOLOGY Vela Prose, personally viewed and evaluated these images (plain radiographs) as part of my medical decision making, as well as reviewing the written report by the radiologist.  ED MD interpretation: No pneumonia  Official radiology report(s): DG Chest 2 View  Result Date: 12/23/2019 CLINICAL DATA:  Shortness of breath EXAM: CHEST - 2 VIEW COMPARISON:  06/07/2019 FINDINGS: Defibrillator is again noted and stable. Cardiac shadow is mildly enlarged but stable. The lungs are well aerated bilaterally. Minimal right basilar atelectasis is seen. No sizable effusion or infiltrate is noted. No bony abnormality is seen. IMPRESSION: Mild right basilar atelectasis. Electronically Signed   By: Alcide Clever M.D.   On: 12/23/2019 17:01    ____________________________________________   PROCEDURES  Procedure(s) performed (including Critical Care):  .Critical Care Performed by: Concha Se, MD Authorized by: Concha Se, MD   Critical care provider statement:    Critical care time (minutes):  45   Critical care was necessary to treat or prevent imminent or life-threatening deterioration of the following conditions:  Endocrine crisis   Critical care was time spent personally by me on the following activities:  Discussions with consultants, evaluation of patient's response to treatment, examination of patient, ordering and performing treatments and interventions, ordering and review of laboratory studies, ordering and review of  radiographic studies, pulse oximetry, re-evaluation of patient's condition, obtaining history from patient or surrogate and review of old charts     ____________________________________________   INITIAL IMPRESSION / ASSESSMENT AND PLAN / ED COURSE  KENZEE BASSIN was evaluated in Emergency Department on 12/23/2019 for the symptoms described in the history of present illness. She was evaluated in the context of the global COVID-19 pandemic, which necessitated consideration that the patient might be at  risk for infection with the SARS-CoV-2 virus that causes COVID-19. Institutional protocols and algorithms that pertain to the evaluation of patients at risk for COVID-19 are in a state of rapid change based on information released by regulatory bodies including the CDC and federal and state organizations. These policies and algorithms were followed during the patient's care in the ED.    Patient comes in with multiple concerns.  For her eye this is concerning for conjunctivitis.  Pupils reactive unlikely glaucoma.  Will give some bacterial eyedrops.  For the abdominal swelling patient's abdomen is slightly tender but patient is preoccupied that she thinks that she is having worsening swelling.  Will get CT scan.  Patient looks pretty euvolemic on exam and satting normally.  Will get chest x-ray and Covid swab for the shortness of breath.  For the increased thirst sugar was noted to be significantly elevated.  Troponins stably elevated similar to her priors at 26 No evidence of anemia.  No white count elevation. BMP shows significantly elevated glucose at 1185 but no anion gap elevation.  Kidney function also slightly elevated at 1.7.  Discussed with patient that given the new diagnosis of diabetes with sugars over thousand that I would recommend admission for insulin drip.  Holding off on fluid resuscitation due to her EF of 25%.  We will discuss with the hospital team for admission       ____________________________________________   FINAL CLINICAL IMPRESSION(S) / ED DIAGNOSES   Final diagnoses:  Acute bacterial conjunctivitis of right eye  Hyperglycemia  New onset type 2 diabetes mellitus (HCC)      MEDICATIONS GIVEN DURING THIS VISIT:  Medications  sodium chloride flush (NS) 0.9 % injection 3 mL ( Intravenous Canceled Entry 12/23/19 1839)  sodium chloride 0.9 % bolus 1,000 mL (0 mLs Intravenous Paused 12/23/19 1752)  insulin regular, human (MYXREDLIN) 100 units/ 100 mL infusion (10 Units/hr Intravenous New Bag/Given 12/23/19 1954)  0.9 %  sodium chloride infusion ( Intravenous New Bag/Given 12/23/19 1955)  dextrose 5 %-0.45 % sodium chloride infusion (has no administration in time range)  dextrose 50 % solution 0-50 mL (has no administration in time range)  trimethoprim-polymyxin b (POLYTRIM) ophthalmic solution 2 drop (has no administration in time range)  potassium chloride 10 mEq in 100 mL IVPB (has no administration in time range)     ED Discharge Orders    None       Note:  This document was prepared using Dragon voice recognition software and may include unintentional dictation errors.   Concha Se, MD 12/23/19 2032

## 2019-12-23 NOTE — ED Notes (Signed)
Pt ambulatory to the bathroom with minimal assistance at this time.

## 2019-12-23 NOTE — ED Notes (Signed)
Pt ambulatory to toilet to urinate.  

## 2019-12-24 ENCOUNTER — Other Ambulatory Visit: Payer: Self-pay

## 2019-12-24 ENCOUNTER — Encounter: Payer: Self-pay | Admitting: Internal Medicine

## 2019-12-24 DIAGNOSIS — I10 Essential (primary) hypertension: Secondary | ICD-10-CM

## 2019-12-24 DIAGNOSIS — N179 Acute kidney failure, unspecified: Secondary | ICD-10-CM

## 2019-12-24 DIAGNOSIS — I5022 Chronic systolic (congestive) heart failure: Secondary | ICD-10-CM

## 2019-12-24 LAB — CBC
HCT: 41.6 % (ref 36.0–46.0)
Hemoglobin: 13.8 g/dL (ref 12.0–15.0)
MCH: 27.4 pg (ref 26.0–34.0)
MCHC: 33.2 g/dL (ref 30.0–36.0)
MCV: 82.7 fL (ref 80.0–100.0)
Platelets: 138 10*3/uL — ABNORMAL LOW (ref 150–400)
RBC: 5.03 MIL/uL (ref 3.87–5.11)
RDW: 13.4 % (ref 11.5–15.5)
WBC: 13.6 10*3/uL — ABNORMAL HIGH (ref 4.0–10.5)
nRBC: 0 % (ref 0.0–0.2)

## 2019-12-24 LAB — BASIC METABOLIC PANEL
Anion gap: 11 (ref 5–15)
BUN: 38 mg/dL — ABNORMAL HIGH (ref 6–20)
CO2: 29 mmol/L (ref 22–32)
Calcium: 9.1 mg/dL (ref 8.9–10.3)
Chloride: 92 mmol/L — ABNORMAL LOW (ref 98–111)
Creatinine, Ser: 1.63 mg/dL — ABNORMAL HIGH (ref 0.44–1.00)
GFR calc Af Amer: 40 mL/min — ABNORMAL LOW (ref >60)
GFR calc non Af Amer: 35 mL/min — ABNORMAL LOW (ref >60)
Glucose, Bld: 406 mg/dL — ABNORMAL HIGH (ref 70–99)
Potassium: 3.7 mmol/L (ref 3.5–5.1)
Sodium: 132 mmol/L — ABNORMAL LOW (ref 135–145)

## 2019-12-24 LAB — GLUCOSE, CAPILLARY
Glucose-Capillary: 250 mg/dL — ABNORMAL HIGH (ref 70–99)
Glucose-Capillary: 280 mg/dL — ABNORMAL HIGH (ref 70–99)
Glucose-Capillary: 328 mg/dL — ABNORMAL HIGH (ref 70–99)
Glucose-Capillary: 361 mg/dL — ABNORMAL HIGH (ref 70–99)
Glucose-Capillary: 368 mg/dL — ABNORMAL HIGH (ref 70–99)
Glucose-Capillary: 369 mg/dL — ABNORMAL HIGH (ref 70–99)
Glucose-Capillary: 400 mg/dL — ABNORMAL HIGH (ref 70–99)
Glucose-Capillary: 408 mg/dL — ABNORMAL HIGH (ref 70–99)

## 2019-12-24 LAB — HEMOGLOBIN A1C
Hgb A1c MFr Bld: 10.8 % — ABNORMAL HIGH (ref 4.8–5.6)
Mean Plasma Glucose: 263.26 mg/dL

## 2019-12-24 LAB — OSMOLALITY: Osmolality: 301 mOsm/kg — ABNORMAL HIGH (ref 275–295)

## 2019-12-24 MED ORDER — INSULIN ASPART 100 UNIT/ML ~~LOC~~ SOLN
0.0000 [IU] | Freq: Three times a day (TID) | SUBCUTANEOUS | Status: DC
  Administered 2019-12-24 (×2): 9 [IU] via SUBCUTANEOUS
  Administered 2019-12-24: 7 [IU] via SUBCUTANEOUS
  Administered 2019-12-25: 3 [IU] via SUBCUTANEOUS
  Administered 2019-12-25: 7 [IU] via SUBCUTANEOUS
  Administered 2019-12-25: 13:00:00 9 [IU] via SUBCUTANEOUS
  Administered 2019-12-26 (×2): 7 [IU] via SUBCUTANEOUS
  Administered 2019-12-27: 11 [IU] via SUBCUTANEOUS
  Filled 2019-12-24 (×9): qty 1

## 2019-12-24 MED ORDER — LIVING WELL WITH DIABETES BOOK
Freq: Once | Status: AC
  Filled 2019-12-24: qty 1

## 2019-12-24 MED ORDER — INSULIN ASPART 100 UNIT/ML ~~LOC~~ SOLN
2.0000 [IU] | Freq: Three times a day (TID) | SUBCUTANEOUS | Status: DC
  Administered 2019-12-24: 17:00:00 2 [IU] via SUBCUTANEOUS
  Filled 2019-12-24: qty 1

## 2019-12-24 MED ORDER — INSULIN DETEMIR 100 UNIT/ML ~~LOC~~ SOLN
8.0000 [IU] | Freq: Two times a day (BID) | SUBCUTANEOUS | Status: DC
  Administered 2019-12-24: 15:00:00 8 [IU] via SUBCUTANEOUS
  Filled 2019-12-24 (×4): qty 0.08

## 2019-12-24 MED ORDER — INSULIN ASPART 100 UNIT/ML ~~LOC~~ SOLN
8.0000 [IU] | Freq: Once | SUBCUTANEOUS | Status: AC
  Administered 2019-12-24: 8 [IU] via SUBCUTANEOUS
  Filled 2019-12-24: qty 1

## 2019-12-24 MED ORDER — INSULIN DETEMIR 100 UNIT/ML ~~LOC~~ SOLN
5.0000 [IU] | SUBCUTANEOUS | Status: DC
  Administered 2019-12-24: 5 [IU] via SUBCUTANEOUS
  Filled 2019-12-24: qty 0.05

## 2019-12-24 MED ORDER — POTASSIUM CHLORIDE 2 MEQ/ML IV SOLN
INTRAVENOUS | Status: DC
  Filled 2019-12-24: qty 1000

## 2019-12-24 MED ORDER — INSULIN STARTER KIT- PEN NEEDLES (ENGLISH)
1.0000 | Freq: Once | Status: AC
  Administered 2019-12-24: 17:00:00 1
  Filled 2019-12-24: qty 1

## 2019-12-24 MED ORDER — INSULIN DETEMIR 100 UNIT/ML ~~LOC~~ SOLN
8.0000 [IU] | Freq: Two times a day (BID) | SUBCUTANEOUS | Status: DC
  Filled 2019-12-24 (×2): qty 0.08

## 2019-12-24 MED ORDER — INSULIN DETEMIR 100 UNIT/ML ~~LOC~~ SOLN
10.0000 [IU] | SUBCUTANEOUS | Status: DC
  Filled 2019-12-24: qty 0.1

## 2019-12-24 MED ORDER — INSULIN ASPART 100 UNIT/ML ~~LOC~~ SOLN
0.0000 [IU] | Freq: Every day | SUBCUTANEOUS | Status: DC
  Administered 2019-12-24: 5 [IU] via SUBCUTANEOUS
  Administered 2019-12-24: 2 [IU] via SUBCUTANEOUS
  Administered 2019-12-25: 22:00:00 3 [IU] via SUBCUTANEOUS
  Administered 2019-12-26: 5 [IU] via SUBCUTANEOUS
  Filled 2019-12-24 (×4): qty 1

## 2019-12-24 MED ORDER — POTASSIUM CHLORIDE IN NACL 20-0.9 MEQ/L-% IV SOLN
INTRAVENOUS | Status: DC
  Filled 2019-12-24 (×4): qty 1000

## 2019-12-24 NOTE — ED Notes (Signed)
Pt given diet ginger ale and water. 

## 2019-12-24 NOTE — Progress Notes (Signed)
Nutrition Brief Note  RD received consult for calorie count  Wt Readings from Last 15 Encounters:  12/24/19 68 kg  06/07/19 73.9 kg  05/10/19 72.6 kg  01/23/19 72.8 kg  09/30/18 68 kg  07/21/18 67.1 kg  12/26/17 61.2 kg  11/07/17 65.3 kg  10/31/17 67.4 kg  07/26/17 68 kg  12/02/16 68 kg  11/11/16 67.6 kg  10/26/16 68 kg  12/30/15 67.1 kg  05/26/15 68 kg   Met with patient at bedside. She reports she has a good appetite and intake here and at baseline. She ate well at breakfast this morning (90%). She couldn't finish it since she was being admitted to a room from the ED. She was working on eating lunch at time of RD assessment. Patient typically eats 3 meals per day and eats well at meals. She denies any weight loss and reports she has actually been gaining weight over time. No subcutaneous fat or muscle wasting found on Nutrition-Focused Physical Exam. Patient does not meet criteria for malnutrition. No indication for calorie count at this time. Discussed with MD via secure chat and MD agrees no indication for calorie count at this time.  Body mass index is 27.44 kg/m. Patient meets criteria for overweight based on current BMI.   Current diet order is heart healthy/carbohydrate modified, patient is consuming approximately 90% of meals at this time. Labs and medications reviewed.   RD also received consult for diet education related to new diagnosis of DM (note to follow).  Jacklynn Barnacle, MS, RD, LDN Pager number available on Amion

## 2019-12-24 NOTE — ED Notes (Signed)
Per Dr. Denton Lank BG can now be checked TID w/ meals and at bedtime

## 2019-12-24 NOTE — Plan of Care (Signed)
  RD consulted for nutrition education regarding new diagnosis of diabetes.   Lab Results  Component Value Date   HGBA1C 10.8 (H) 12/23/2019   Met with patient at bedside. She reports good appetite and intake. She eats 3 meals per day. She typically has a meat with sides. She avoids fried or greasy foods. She does endorse drinking soda or juice throughout the day.  RD provided "Carbohydrate Counting for People with Diabetes" handout from the Academy of [employer]. Discussed different food groups and their effects on blood sugar, emphasizing carbohydrate-containing foods. Provided list of carbohydrates and recommended serving sizes of common foods.  Discussed importance of controlled and consistent carbohydrate intake throughout the day. Provided examples of ways to balance meals/snacks and encouraged intake of high-fiber, whole grain complex carbohydrates. Teach back method used.  Expect good compliance.  Body mass index is 27.44 kg/m. Pt meets criteria for overweight based on current BMI.  Current diet order is heart healthy/carbohydrate modified, patient is consuming approximately 90% of meals at this time. Labs and medications reviewed. No further nutrition interventions warranted at this time. RD contact information provided. If additional nutrition issues arise, please re-consult RD.  Jacklynn Barnacle, MS, RD, LDN Pager number available on Amion

## 2019-12-24 NOTE — Progress Notes (Signed)
PROGRESS NOTE    Heather Choi  EVO:[id number] DOB: Apr 01, 1961 DOA: 12/23/2019  PCP: Kandyce Rud, MD    LOS - 1   Brief Narrative:  Heather Choi is a 59 y.o. female with medical history significant for chronic systolic heart failure, last EF 25%, history of SVT status post ablation, COPD, HTN, hypothyroidism who presented to the ED on 3/23 with multiple complaints including right eye redness with discomfort, intermittent shortness of breath, feeling of abdominal fullness without pain, generalized weakness.  In the ED, vitals stable.  Labs showed blood glucose 1185, anion gap 14, consistent with HHS.  Patient without prior diagnosis of diabetes.  Hemoglobin A1C is 10.8%.    Subjective 3/24: Patient seen at bedside.  No acute events overnight since admission.  She reports feeling slightly better.  Abdomen still feels bloated/full, but no N/V.  No recent illnesses.  Has several diabetic family members.  Left eye feels a little better with drops.  Assessment & Plan:   Principal Problem:   Hyperosmolar hyperglycemic state (HHS) (HCC) Active Problems:   Chronic systolic CHF (congestive heart failure) (HCC)   COPD (chronic obstructive pulmonary disease) (HCC)   HTN (hypertension)   Hypothyroidism   New onset type 2 diabetes mellitus (HCC)   Conjunctivitis, right eye   AKI (acute kidney injury) (HCC)   Hyperosmolar hyperglycemic state (HHS)  New onset type 2 diabetes mellitus - Hbg A1C 10.8% Presented with blood glucose 1185, no prior history of diabetes.  Off insulin drip. --reduce IV fluid rate to 50 cc/hr (hx of HFrEF), NS + K --Levemir 8 units BID --sliding scale Novolog, sensitive --diabetes coordinator following  AKI (acute kidney injury) - likely prerenal due to HHS causing dehydration Presented with Cr 1.71 (baseline of 1.16).  IV hydration as above.   --monitor BMP  Conjunctivitis, right eye -Antibiotic eyedrops to right eye  Chronic systolic CHF  (congestive heart failure) - euvolemic, compensated -Continue digoxin, lisinopril.   --Not currently on beta-blocker -Hold furosemide, spironolactone and metolazone given need for hydration and resume as appropriate  COPD - Not acutely exacerbated  -Continue home bronchodilators    HTN (hypertension) -Blood pressure stable.  Continue lisinopril.      Hypothyroidism -Continue levothyroxine   DVT prophylaxis: Lovenox   Code Status: Full Code  Family Communication: none at bedside during encounter   Disposition Plan:  Expect home in 24-48 hours, pending determination of insulin requirements and patient educated on giving herself insulin. Coming From home Exp DC Date 3/25 Barriers new diabetes diagnosis Medically Stable for Discharge? no   Consultants:   Diabetes coordinator  Dietician  Procedures:   None  Antimicrobials:   None    Objective: Vitals:   12/24/19 0630 12/24/19 0730 12/24/19 0800 12/24/19 0830  BP: 112/72 121/76 110/84 123/88  Pulse: 70 70 70 71  Resp: (!) 24 19 16 19   Temp:      SpO2: 92% 93% 96% 95%  Weight:      Height:        Intake/Output Summary (Last 24 hours) at 12/24/2019 0844 Last data filed at 12/23/2019 2248 Gross per 24 hour  Intake 25.51 ml  Output --  Net 25.51 ml   Filed Weights   12/23/19 1632  Weight: 68 kg    Examination:  General exam: awake, alert, no acute distress, obese Respiratory system: CTAB, no wheezes, rales or rhonchi, normal respiratory effort. Cardiovascular system: normal S1/S2, RRR, no JVD, murmurs, rubs, gallops, no pedal edema.  Gastrointestinal system: soft, mild epigastric tenderness, no distention, no HSM felt, +bowel sounds. Central nervous system: A&O x4. no gross focal neurologic deficits, normal speech Extremities: moves all, normal tone Skin: dry, intact, normal temperature Psychiatry: normal mood, congruent affect, judgement and insight appear normal    Data Reviewed: I have personally  reviewed following labs and imaging studies  CBC: Recent Labs  Lab 12/23/19 1635 12/24/19 0536  WBC 7.4 13.6*  HGB 14.1 13.8  HCT 45.2 41.6  MCV 89.5 82.7  PLT 134* 138*   Basic Metabolic Panel: Recent Labs  Lab 12/23/19 1635 12/23/19 2253 12/24/19 0352  NA 119* 137 132*  K 4.0 3.1* 3.7  CL 78* 93* 92*  CO2 27 30 29   GLUCOSE 1,185* 209* 406*  BUN 34* 35* 38*  CREATININE 1.71* 1.73* 1.63*  CALCIUM 9.1 10.2 9.1   GFR: Estimated Creatinine Clearance: 34.4 mL/min (A) (by C-G formula based on SCr of 1.63 mg/dL (H)). Liver Function Tests: Recent Labs  Lab 12/23/19 1800  AST 15  ALT 13  ALKPHOS 114  BILITOT 0.7  PROT 7.7  ALBUMIN 4.0   Recent Labs  Lab 12/23/19 1800  LIPASE 89*   No results for input(s): AMMONIA in the last 168 hours. Coagulation Profile: No results for input(s): INR, PROTIME in the last 168 hours. Cardiac Enzymes: No results for input(s): CKTOTAL, CKMB, CKMBINDEX, TROPONINI in the last 168 hours. BNP (last 3 results) No results for input(s): PROBNP in the last 8760 hours. HbA1C: No results for input(s): HGBA1C in the last 72 hours. CBG: Recent Labs  Lab 12/24/19 0053 12/24/19 0238 12/24/19 0356 12/24/19 0535 12/24/19 0740  GLUCAP 361* 400* 408* 368* 280*   Lipid Profile: No results for input(s): CHOL, HDL, LDLCALC, TRIG, CHOLHDL, LDLDIRECT in the last 72 hours. Thyroid Function Tests: No results for input(s): TSH, T4TOTAL, FREET4, T3FREE, THYROIDAB in the last 72 hours. Anemia Panel: No results for input(s): VITAMINB12, FOLATE, FERRITIN, TIBC, IRON, RETICCTPCT in the last 72 hours. Sepsis Labs: No results for input(s): PROCALCITON, LATICACIDVEN in the last 168 hours.  Recent Results (from the past 240 hour(s))  Respiratory Panel by RT PCR (Flu A&B, Covid) - Nasopharyngeal Swab     Status: None   Collection Time: 12/23/19  7:56 PM   Specimen: Nasopharyngeal Swab  Result Value Ref Range Status   SARS Coronavirus 2 by RT PCR  NEGATIVE NEGATIVE Final    Comment: (NOTE) SARS-CoV-2 target nucleic acids are NOT DETECTED. The SARS-CoV-2 RNA is generally detectable in upper respiratoy specimens during the acute phase of infection. The lowest concentration of SARS-CoV-2 viral copies this assay can detect is 131 copies/mL. A negative result does not preclude SARS-Cov-2 infection and should not be used as the sole basis for treatment or other patient management decisions. A negative result may occur with  improper specimen collection/handling, submission of specimen other than nasopharyngeal swab, presence of viral mutation(s) within the areas targeted by this assay, and inadequate number of viral copies (<131 copies/mL). A negative result must be combined with clinical observations, patient history, and epidemiological information. The expected result is Negative. Fact Sheet for Patients:  12/25/19 Fact Sheet for Healthcare Providers:  https://www.moore.com/ This test is not yet ap proved or cleared by the https://www.young.biz/ FDA and  has been authorized for detection and/or diagnosis of SARS-CoV-2 by FDA under an Emergency Use Authorization (EUA). This EUA will remain  in effect (meaning this test can be used) for the duration of the COVID-19 declaration under  Section 564(b)(1) of the Act, 21 U.S.C. section 360bbb-3(b)(1), unless the authorization is terminated or revoked sooner.    Influenza A by PCR NEGATIVE NEGATIVE Final   Influenza B by PCR NEGATIVE NEGATIVE Final    Comment: (NOTE) The Xpert Xpress SARS-CoV-2/FLU/RSV assay is intended as an aid in  the diagnosis of influenza from Nasopharyngeal swab specimens and  should not be used as a sole basis for treatment. Nasal washings and  aspirates are unacceptable for Xpert Xpress SARS-CoV-2/FLU/RSV  testing. Fact Sheet for Patients: https://www.moore.com/ Fact Sheet for Healthcare  Providers: https://www.young.biz/ This test is not yet approved or cleared by the [place] FDA and  has been authorized for detection and/or diagnosis of SARS-CoV-2 by  FDA under an Emergency Use Authorization (EUA). This EUA will remain  in effect (meaning this test can be used) for the duration of the  Covid-19 declaration under Section 564(b)(1) of the Act, 21  U.S.C. section 360bbb-3(b)(1), unless the authorization is  terminated or revoked. Performed at [facility], [address] [place], [place] [address]          Radiology Studies: CT ABDOMEN PELVIS WO CONTRAST  Result Date: 12/23/2019 CLINICAL DATA:  59 year old female with abdominal distension. EXAM: CT ABDOMEN AND PELVIS WITHOUT CONTRAST TECHNIQUE: Multidetector CT imaging of the abdomen and pelvis was performed following the standard protocol without IV contrast. COMPARISON:  CT abdomen pelvis dated 05/10/2019. FINDINGS: Evaluation of this exam is limited in the absence of intravenous contrast. Lower chest: The visualized lung bases are clear. Top-normal cardiac size. Partially visualized AICD device. No intra-abdominal free air or free fluid. Hepatobiliary: The liver is grossly unremarkable. Subcentimeter right hepatic hypodense focus is too small to characterize. No intrahepatic biliary ductal dilatation. Cholecystectomy. Pancreas: Unremarkable. No pancreatic ductal dilatation or surrounding inflammatory changes. Spleen: Normal in size without focal abnormality. Adrenals/Urinary Tract: The adrenal glands are unremarkable. There is no hydronephrosis or nephrolithiasis on either side. The visualized ureters and urinary bladder appear unremarkable. Tiny pocket of air within the bladder may be related to recent instrumentation. Clinical correlation is recommended. Stomach/Bowel: There is moderate stool throughout the colon. There is no bowel obstruction or active inflammation. The appendix is normal.  Vascular/Lymphatic: Mild aortoiliac atherosclerotic disease. The IVC is grossly unremarkable. No portal venous gas. There is no adenopathy. Reproductive: Hysterectomy. No adnexal masses. Other: None Musculoskeletal: Degenerative changes of the spine. No acute osseous pathology. IMPRESSION: No acute intra-abdominal or pelvic pathology. No bowel obstruction. Normal appendix. Electronically Signed   By: Elgie Collard M.D.   On: 12/23/2019 19:46   DG Chest 2 View  Result Date: 12/23/2019 CLINICAL DATA:  Shortness of breath EXAM: CHEST - 2 VIEW COMPARISON:  06/07/2019 FINDINGS: Defibrillator is again noted and stable. Cardiac shadow is mildly enlarged but stable. The lungs are well aerated bilaterally. Minimal right basilar atelectasis is seen. No sizable effusion or infiltrate is noted. No bony abnormality is seen. IMPRESSION: Mild right basilar atelectasis. Electronically Signed   By: Alcide Clever M.D.   On: 12/23/2019 17:01        Scheduled Meds:  carvedilol  12.5 mg Oral BID   enoxaparin (LOVENOX) injection  40 mg Subcutaneous Q24H   insulin aspart  0-5 Units Subcutaneous QHS   insulin aspart  0-9 Units Subcutaneous TID WC   [START ON 12/25/2019] insulin detemir  10 Units Subcutaneous Q24H   levothyroxine  137 mcg Oral Daily   sodium chloride flush  3 mL Intravenous Once   trimethoprim-polymyxin b  2 drop Right Eye Q6H   Continuous Infusions:  0.9 % NaCl with KCl 20 mEq / L 50 mL/hr at 12/24/19 0618   insulin Stopped (12/24/19 0733)   lactated ringers     sodium chloride Stopped (12/23/19 1752)     LOS: 1 day    Time spent: 40 minutes    Ezekiel Slocumb, DO [facility]   If 7PM-7AM, please contact night-coverage [email or web address] 12/24/2019, 8:44 AM

## 2019-12-24 NOTE — Progress Notes (Signed)
Pt demonstrates ability to administer her insulin injection

## 2019-12-24 NOTE — ED Notes (Signed)
Pt given breakfast tray

## 2019-12-24 NOTE — Progress Notes (Addendum)
Inpatient Diabetes Program Recommendations  AACE/ADA: New Consensus Statement on Inpatient Glycemic Control (2015)  Target Ranges:  Prepandial:   less than 140 mg/dL      Peak postprandial:   less than 180 mg/dL (1-2 hours)      Critically ill patients:  140 - 180 mg/dL   Lab Results  Component Value Date   GLUCAP 280 (H) 12/24/2019   HGBA1C 10.8 (H) 12/23/2019    Review of Glycemic Control Results for Heather Choi, Heather Choi (MRN [id number]) as of 12/24/2019 10:14  Ref. Range 12/23/2019 22:53 12/24/2019 00:53 12/24/2019 02:38 12/24/2019 03:56 12/24/2019 05:35 12/24/2019 07:40  Glucose-Capillary Latest Ref Range: 70 - 99 mg/dL 215 (H) 361 (H) 400 (H) 408 (H) 368 (H) 280 (H)   Diabetes history: New diagnosis of DM  Outpatient Diabetes medications:  None Current orders for Inpatient glycemic control:  Levemir 10 units q HS, Novolog sensitive tid with meals  Inpatient Diabetes Program Recommendations:    Consider adding Novolog meal coverage 2 units tid with meals.  Also may need to change Levemir to 8 units bid?  Note that this is new diagnosis of DM, however note elevated blood sugars in past lab values.  A1C indicates average blood sugar of 264 mg/dL.  May need insulin at d/c.  Will order LWWD booklet and Insulin starter kit.    Thanks  Adah Perl, RN, BC-ADM Inpatient Diabetes Coordinator Pager [phone] (8a-5p)  Addendum [phone]- briefly reviewed DM diagnosis and DM with patient.  Discussed normal blood sugars, when to monitor, and basic information regarding DM.  Patient states that she knows how to check blood sugars because she has helped her sister.  She works as a Scientist, water quality during the day. Explained that she will need to check blood sugars tid prior to meals. Also discussed potential need for insulin.  She has already practiced administering insulin with bedside RN.  Will follow up on 3/25 to review insulin pen as well.  Discussed signs, symptoms and treatment of hypoglycemia as well.   Patient verbalized understanding.

## 2019-12-24 NOTE — ED Notes (Signed)
Verbal given by Dr. Denton Lank that pt can now go to med-surg, admission order changed by this RN to reflect this change

## 2019-12-25 LAB — CBC
HCT: 39.4 % (ref 36.0–46.0)
Hemoglobin: 12.9 g/dL (ref 12.0–15.0)
MCH: 27.6 pg (ref 26.0–34.0)
MCHC: 32.7 g/dL (ref 30.0–36.0)
MCV: 84.2 fL (ref 80.0–100.0)
Platelets: 128 10*3/uL — ABNORMAL LOW (ref 150–400)
RBC: 4.68 MIL/uL (ref 3.87–5.11)
RDW: 13.6 % (ref 11.5–15.5)
WBC: 7.8 10*3/uL (ref 4.0–10.5)
nRBC: 0 % (ref 0.0–0.2)

## 2019-12-25 LAB — BASIC METABOLIC PANEL
Anion gap: 7 (ref 5–15)
BUN: 35 mg/dL — ABNORMAL HIGH (ref 6–20)
CO2: 29 mmol/L (ref 22–32)
Calcium: 8.5 mg/dL — ABNORMAL LOW (ref 8.9–10.3)
Chloride: 99 mmol/L (ref 98–111)
Creatinine, Ser: 1.46 mg/dL — ABNORMAL HIGH (ref 0.44–1.00)
GFR calc Af Amer: 46 mL/min — ABNORMAL LOW (ref >60)
GFR calc non Af Amer: 40 mL/min — ABNORMAL LOW (ref >60)
Glucose, Bld: 364 mg/dL — ABNORMAL HIGH (ref 70–99)
Potassium: 3.8 mmol/L (ref 3.5–5.1)
Sodium: 135 mmol/L (ref 135–145)

## 2019-12-25 LAB — GLUCOSE, CAPILLARY
Glucose-Capillary: 327 mg/dL — ABNORMAL HIGH (ref 70–99)
Glucose-Capillary: 356 mg/dL — ABNORMAL HIGH (ref 70–99)
Glucose-Capillary: 201 mg/dL — ABNORMAL HIGH (ref 70–99)
Glucose-Capillary: 291 mg/dL — ABNORMAL HIGH (ref 70–99)

## 2019-12-25 LAB — MAGNESIUM: Magnesium: 2.4 mg/dL (ref 1.7–2.4)

## 2019-12-25 MED ORDER — INSULIN GLARGINE 100 UNIT/ML ~~LOC~~ SOLN
20.0000 [IU] | Freq: Every day | SUBCUTANEOUS | Status: DC
  Filled 2019-12-25: qty 0.2

## 2019-12-25 MED ORDER — INSULIN DETEMIR 100 UNIT/ML ~~LOC~~ SOLN
10.0000 [IU] | Freq: Two times a day (BID) | SUBCUTANEOUS | Status: AC
  Administered 2019-12-25: 22:00:00 10 [IU] via SUBCUTANEOUS
  Filled 2019-12-25: qty 0.1

## 2019-12-25 MED ORDER — MENTHOL 3 MG MT LOZG
1.0000 | LOZENGE | OROMUCOSAL | Status: DC | PRN
  Filled 2019-12-25: qty 9

## 2019-12-25 MED ORDER — INSULIN DETEMIR 100 UNIT/ML ~~LOC~~ SOLN
10.0000 [IU] | Freq: Two times a day (BID) | SUBCUTANEOUS | Status: DC
  Administered 2019-12-25: 09:00:00 10 [IU] via SUBCUTANEOUS
  Filled 2019-12-25 (×2): qty 0.1

## 2019-12-25 MED ORDER — LORATADINE 10 MG PO TABS
10.0000 mg | ORAL_TABLET | Freq: Every day | ORAL | Status: DC
  Administered 2019-12-25 – 2019-12-28 (×4): 10 mg via ORAL
  Filled 2019-12-25 (×4): qty 1

## 2019-12-25 MED ORDER — INSULIN DETEMIR 100 UNIT/ML ~~LOC~~ SOLN
20.0000 [IU] | Freq: Two times a day (BID) | SUBCUTANEOUS | Status: DC
  Filled 2019-12-25: qty 0.2

## 2019-12-25 MED ORDER — GLIMEPIRIDE 1 MG PO TABS
1.0000 mg | ORAL_TABLET | Freq: Every day | ORAL | Status: DC
  Filled 2019-12-25 (×2): qty 1

## 2019-12-25 MED ORDER — INSULIN ASPART 100 UNIT/ML ~~LOC~~ SOLN
4.0000 [IU] | Freq: Three times a day (TID) | SUBCUTANEOUS | Status: DC
  Administered 2019-12-25 – 2019-12-26 (×4): 4 [IU] via SUBCUTANEOUS
  Filled 2019-12-25 (×4): qty 1

## 2019-12-25 NOTE — Progress Notes (Addendum)
Inpatient Diabetes Program Recommendations  AACE/ADA: New Consensus Statement on Inpatient Glycemic Control (2015)  Target Ranges:  Prepandial:   less than 140 mg/dL      Peak postprandial:   less than 180 mg/dL (1-2 hours)      Critically ill patients:  140 - 180 mg/dL   Lab Results  Component Value Date   GLUCAP 327 (H) 12/25/2019   HGBA1C 10.8 (H) 12/23/2019    Review of Glycemic Control Results for Heather Choi, Heather Choi (MRN [id number]) as of 12/25/2019 11:02  Ref. Range 12/24/2019 07:40 12/24/2019 12:00 12/24/2019 16:48 12/24/2019 21:43 12/25/2019 07:56  Glucose-Capillary Latest Ref Range: 70 - 99 mg/dL 280 (H) 369 (H) 328 (H) 250 (H) 327 (H)   Diabetes history: DM 2- New Diagnosis Outpatient Diabetes medications:  None Current orders for Inpatient glycemic control:  Novolog sensitive tid with meals and HS Levemir 10 units bid Novolog 4 units daily  Inpatient Diabetes Program Recommendations:         Educated patient and spouse on insulin pen use at home.  Reviewed contents of insulin flexpen starter kit.  Reviewed all steps if insulin pen including attachment of needle, 2-unit air shot, dialing up dose, giving injection, removing needle, disposal of sharps, storage of unused insulin, disposal of insulin etc.  Patient able to provide successful return demonstration.  Also reviewed troubleshooting with insulin pen.  MD to give patient Rx's for insulin pens and insulin pen needles.      Spoke to patient at length regarding new diagnosis of DM.  We reviewed normal blood sugar levels, importance of eliminating sugar from beverages, exercise, and importance of follow-up with PCP.  She is asking whether she will need more than one shot a day?  I told her that this would be up to the MD.        Recommend increasing Levemir to 20 units once a day. Also may be able to be on sulfonylurea for meal coverage such as Amaryl 1 mg daily?         Reviewed hypoglycemia signs, symptoms, and treatment as well.   At d/c patient will need Rx. For Levemir Flexpen, insulin pen needles and oral agent as ordered.  Patient is agreeable to going to outpatient DM classes as well if it works in her schedule.   Thanks  Adah Perl, RN, BC-ADM Inpatient Diabetes Coordinator Pager [phone] (8a-5p)

## 2019-12-25 NOTE — Progress Notes (Signed)
PROGRESS NOTE    Heather Choi  AYT:[id number] DOB: [birth year] DOA: 12/23/2019  PCP: Heather Late, MD    LOS - 2   Brief Narrative:  Heather Choi a 59 y.o.femalewith medical history significant forchronic systolic heart failure, last EF 25%, history of SVT status post ablation, COPD, HTN, hypothyroidism who presented to the ED on 3/23 with multiple complaints including right eye redness with discomfort, intermittent shortness of breath, feeling of abdominal fullness without pain, generalized weakness.  In the ED, vitals stable.  Labs showed blood glucose 1185, anion gap 14, consistent with HHS.  Patient without prior diagnosis of diabetes.  Hemoglobin A1C is 10.8%.    Subjective 3/25: Patient sitting up in bed when seen this morning.  No acute events reported overnight.  She reports she continues to have a scratchy throat that she thinks may be due to allergies.  She reports mild intermittent nausea but no vomiting.  Denies fevers chills or other complaints.  Assessment & Plan:   Principal Problem:   Hyperosmolar hyperglycemic state (HHS) ([place]) Active Problems:   Chronic systolic CHF (congestive heart failure) (HCC)   COPD (chronic obstructive pulmonary disease) (HCC)   HTN (hypertension)   Hypothyroidism   New onset type 2 diabetes mellitus (HCC)   Conjunctivitis, right eye   AKI (acute kidney injury) ([place])   Hyperosmolar hyperglycemic state (HHS)  New onset type 2 diabetes mellitus - Hbg A1C 10.8% Presented with blood glucose 1185, no prior history of diabetes.  Off insulin drip.   --stop IV fluids --change long acting to Lantus 20 units daily --last dose Levemir 10 units tonight --Add NovoLog 4 units TID WC --Add Amaryl 1 mg daily with breakfast --sliding scale Novolog, sensitive --diabetes coordinator following  AKI (acute kidney injury) - likely prerenal due to HHS causing dehydration Presented with Cr 1.71 (baseline of 1.16).  IV hydration as above.     --monitor BMP  Conjunctivitis, right eye -Antibiotic eyedrops to right eye  Chronic systolic CHF (congestive heart failure) - euvolemic, compensated -Continue digoxin, lisinopril.  --Not currently on beta-blocker -Hold furosemide, spironolactone and metolazone given need for hydration and resume as appropriate  COPD - Not acutely exacerbated  -Continue home bronchodilators  HTN (hypertension) -Blood pressure stable. Continue lisinopril.   Hypothyroidism -Continue levothyroxine   DVT prophylaxis: Lovenox   Code Status: Full Code  Family Communication: none at bedside during encounter   Disposition Plan:  Expect home in 24-48 hours, pending determination of insulin requirements.   Coming From home Exp DC Date 3/25 Barriers determine insulin needs Medically Stable for Discharge? no   Consultants:   Diabetes coordinator  Dietician  Procedures:   None  Antimicrobials:   None      Objective: Vitals:   12/24/19 1215 12/24/19 1650 12/25/19 0008 12/25/19 0742  BP:  110/70 107/73 118/60  Pulse:  74 79 70  Resp:  18 16 16   Temp: 98.3 F (36.8 C) 98.1 F (36.7 C) 98.1 F (36.7 C) 98 F (36.7 C)  TempSrc: Oral Oral Oral Oral  SpO2:  95% 94% 96%  Weight:      Height:        Intake/Output Summary (Last 24 hours) at 12/25/2019 1240 Last data filed at 12/25/2019 0307 Gross per 24 hour  Intake 940.07 ml  Output --  Net 940.07 ml   Filed Weights   12/23/19 1632 12/24/19 1115  Weight: 68 kg 68 kg    Examination:  General exam: awake, alert, no acute  distress, obese HEENT: moist mucus membranes, hearing grossly normal  Respiratory system: CTAB but decreased, no wheezes, rales or rhonchi, normal respiratory effort. Cardiovascular system: normal S1/S2, RRR, no JVD, murmurs, rubs, gallops, no pedal edema.   Gastrointestinal system: soft, NT, ND, no HSM felt, +bowel sounds. Central nervous system: A&O x4. no gross focal neurologic deficits,  normal speech Extremities: moves all, normal tone Skin: dry, intact, normal temperature, normal color Psychiatry: normal mood, congruent affect, judgement and insight appear normal    Data Reviewed: I have personally reviewed following labs and imaging studies  CBC: Recent Labs  Lab 12/23/19 1635 12/24/19 0536 12/25/19 0449  WBC 7.4 13.6* 7.8  HGB 14.1 13.8 12.9  HCT 45.2 41.6 39.4  MCV 89.5 82.7 84.2  PLT 134* 138* 128*   Basic Metabolic Panel: Recent Labs  Lab 12/23/19 1635 12/23/19 2253 12/24/19 0352 12/25/19 0449  NA 119* 137 132* 135  K 4.0 3.1* 3.7 3.8  CL 78* 93* 92* 99  CO2 27 30 29 29   GLUCOSE 1,185* 209* 406* 364*  BUN 34* 35* 38* 35*  CREATININE 1.71* 1.73* 1.63* 1.46*  CALCIUM 9.1 10.2 9.1 8.5*  MG  --   --   --  2.4   GFR: Estimated Creatinine Clearance: 38.5 mL/min (A) (by C-G formula based on SCr of 1.46 mg/dL (H)). Liver Function Tests: Recent Labs  Lab 12/23/19 1800  AST 15  ALT 13  ALKPHOS 114  BILITOT 0.7  PROT 7.7  ALBUMIN 4.0   Recent Labs  Lab 12/23/19 1800  LIPASE 89*   No results for input(s): AMMONIA in the last 168 hours. Coagulation Profile: No results for input(s): INR, PROTIME in the last 168 hours. Cardiac Enzymes: No results for input(s): CKTOTAL, CKMB, CKMBINDEX, TROPONINI in the last 168 hours. BNP (last 3 results) No results for input(s): PROBNP in the last 8760 hours. HbA1C: Recent Labs    12/23/19 2252  HGBA1C 10.8*   CBG: Recent Labs  Lab 12/24/19 1200 12/24/19 1648 12/24/19 2143 12/25/19 0756 12/25/19 1216  GLUCAP 369* 328* 250* 327* 356*   Lipid Profile: No results for input(s): CHOL, HDL, LDLCALC, TRIG, CHOLHDL, LDLDIRECT in the last 72 hours. Thyroid Function Tests: No results for input(s): TSH, T4TOTAL, FREET4, T3FREE, THYROIDAB in the last 72 hours. Anemia Panel: No results for input(s): VITAMINB12, FOLATE, FERRITIN, TIBC, IRON, RETICCTPCT in the last 72 hours. Sepsis Labs: No results for  input(s): PROCALCITON, LATICACIDVEN in the last 168 hours.  Recent Results (from the past 240 hour(s))  Respiratory Panel by RT PCR (Flu A&B, Covid) - Nasopharyngeal Swab     Status: None   Collection Time: 12/23/19  7:56 PM   Specimen: Nasopharyngeal Swab  Result Value Ref Range Status   SARS Coronavirus 2 by RT PCR NEGATIVE NEGATIVE Final    Comment: (NOTE) SARS-CoV-2 target nucleic acids are NOT DETECTED. The SARS-CoV-2 RNA is generally detectable in upper respiratoy specimens during the acute phase of infection. The lowest concentration of SARS-CoV-2 viral copies this assay can detect is 131 copies/mL. A negative result does not preclude SARS-Cov-2 infection and should not be used as the sole basis for treatment or other patient management decisions. A negative result may occur with  improper specimen collection/handling, submission of specimen other than nasopharyngeal swab, presence of viral mutation(s) within the areas targeted by this assay, and inadequate number of viral copies (<131 copies/mL). A negative result must be combined with clinical observations, patient history, and epidemiological information. The expected result is  Negative. Fact Sheet for Patients:  https://www.moore.com/ Fact Sheet for Healthcare Providers:  https://www.young.biz/ This test is not yet ap proved or cleared by the [place] FDA and  has been authorized for detection and/or diagnosis of SARS-CoV-2 by FDA under an Emergency Use Authorization (EUA). This EUA will remain  in effect (meaning this test can be used) for the duration of the COVID-19 declaration under Section 564(b)(1) of the Act, 21 U.S.C. section 360bbb-3(b)(1), unless the authorization is terminated or revoked sooner.    Influenza A by PCR NEGATIVE NEGATIVE Final   Influenza B by PCR NEGATIVE NEGATIVE Final    Comment: (NOTE) The Xpert Xpress SARS-CoV-2/FLU/RSV assay is intended as an aid  in  the diagnosis of influenza from Nasopharyngeal swab specimens and  should not be used as a sole basis for treatment. Nasal washings and  aspirates are unacceptable for Xpert Xpress SARS-CoV-2/FLU/RSV  testing. Fact Sheet for Patients: https://www.moore.com/ Fact Sheet for Healthcare Providers: https://www.young.biz/ This test is not yet approved or cleared by the [place] FDA and  has been authorized for detection and/or diagnosis of SARS-CoV-2 by  FDA under an Emergency Use Authorization (EUA). This EUA will remain  in effect (meaning this test can be used) for the duration of the  Covid-19 declaration under Section 564(b)(1) of the Act, 21  U.S.C. section 360bbb-3(b)(1), unless the authorization is  terminated or revoked. Performed at [facility], [address] [place], [place] [address]          Radiology Studies: CT ABDOMEN PELVIS WO CONTRAST  Result Date: 12/23/2019 CLINICAL DATA:  59 year old female with abdominal distension. EXAM: CT ABDOMEN AND PELVIS WITHOUT CONTRAST TECHNIQUE: Multidetector CT imaging of the abdomen and pelvis was performed following the standard protocol without IV contrast. COMPARISON:  CT abdomen pelvis dated 05/10/2019. FINDINGS: Evaluation of this exam is limited in the absence of intravenous contrast. Lower chest: The visualized lung bases are clear. Top-normal cardiac size. Partially visualized AICD device. No intra-abdominal free air or free fluid. Hepatobiliary: The liver is grossly unremarkable. Subcentimeter right hepatic hypodense focus is too small to characterize. No intrahepatic biliary ductal dilatation. Cholecystectomy. Pancreas: Unremarkable. No pancreatic ductal dilatation or surrounding inflammatory changes. Spleen: Normal in size without focal abnormality. Adrenals/Urinary Tract: The adrenal glands are unremarkable. There is no hydronephrosis or nephrolithiasis on either side. The  visualized ureters and urinary bladder appear unremarkable. Tiny pocket of air within the bladder may be related to recent instrumentation. Clinical correlation is recommended. Stomach/Bowel: There is moderate stool throughout the colon. There is no bowel obstruction or active inflammation. The appendix is normal. Vascular/Lymphatic: Mild aortoiliac atherosclerotic disease. The IVC is grossly unremarkable. No portal venous gas. There is no adenopathy. Reproductive: Hysterectomy. No adnexal masses. Other: None Musculoskeletal: Degenerative changes of the spine. No acute osseous pathology. IMPRESSION: No acute intra-abdominal or pelvic pathology. No bowel obstruction. Normal appendix. Electronically Signed   By: Elgie Collard M.D.   On: 12/23/2019 19:46   DG Chest 2 View  Result Date: 12/23/2019 CLINICAL DATA:  Shortness of breath EXAM: CHEST - 2 VIEW COMPARISON:  06/07/2019 FINDINGS: Defibrillator is again noted and stable. Cardiac shadow is mildly enlarged but stable. The lungs are well aerated bilaterally. Minimal right basilar atelectasis is seen. No sizable effusion or infiltrate is noted. No bony abnormality is seen. IMPRESSION: Mild right basilar atelectasis. Electronically Signed   By: Alcide Clever M.D.   On: 12/23/2019 17:01        Scheduled Meds: . carvedilol  12.5 mg Oral BID  . enoxaparin (LOVENOX) injection  40 mg Subcutaneous Q24H  . [START ON 12/26/2019] glimepiride  1 mg Oral Q breakfast  . insulin aspart  0-5 Units Subcutaneous QHS  . insulin aspart  0-9 Units Subcutaneous TID WC  . insulin aspart  4 Units Subcutaneous TID WC  . insulin detemir  10 Units Subcutaneous BID  . levothyroxine  137 mcg Oral Daily  . loratadine  10 mg Oral Daily  . sodium chloride flush  3 mL Intravenous Once  . trimethoprim-polymyxin b  2 drop Right Eye Q6H   Continuous Infusions: . 0.9 % NaCl with KCl 20 mEq / L 50 mL/hr at 12/25/19 0748  . sodium chloride Stopped (12/23/19 1752)     LOS: 2  days    Time spent: 35-40 minutes    Pennie Banter, DO [facility]   If 7PM-7AM, please contact night-coverage [email or web address] 12/25/2019, 12:40 PM

## 2019-12-26 DIAGNOSIS — E119 Type 2 diabetes mellitus without complications: Secondary | ICD-10-CM

## 2019-12-26 LAB — GLUCOSE, CAPILLARY
Glucose-Capillary: 302 mg/dL — ABNORMAL HIGH (ref 70–99)
Glucose-Capillary: 72 mg/dL (ref 70–99)
Glucose-Capillary: 388 mg/dL — ABNORMAL HIGH (ref 70–99)

## 2019-12-26 LAB — BASIC METABOLIC PANEL
Anion gap: 9 (ref 5–15)
BUN: 31 mg/dL — ABNORMAL HIGH (ref 6–20)
CO2: 28 mmol/L (ref 22–32)
Calcium: 8.7 mg/dL — ABNORMAL LOW (ref 8.9–10.3)
Chloride: 100 mmol/L (ref 98–111)
Creatinine, Ser: 1.1 mg/dL — ABNORMAL HIGH (ref 0.44–1.00)
GFR calc Af Amer: >60 mL/min (ref >60)
GFR calc non Af Amer: 56 mL/min — ABNORMAL LOW (ref >60)
Glucose, Bld: 251 mg/dL — ABNORMAL HIGH (ref 70–99)
Potassium: 3.5 mmol/L (ref 3.5–5.1)
Sodium: 137 mmol/L (ref 135–145)

## 2019-12-26 LAB — MAGNESIUM: Magnesium: 2.2 mg/dL (ref 1.7–2.4)

## 2019-12-26 LAB — GROUP A STREP BY PCR: Group A Strep by PCR: NOT DETECTED

## 2019-12-26 MED ORDER — INSULIN ASPART 100 UNIT/ML ~~LOC~~ SOLN
8.0000 [IU] | Freq: Three times a day (TID) | SUBCUTANEOUS | Status: DC
  Administered 2019-12-26: 13:00:00 8 [IU] via SUBCUTANEOUS
  Filled 2019-12-26: qty 1

## 2019-12-26 MED ORDER — INSULIN GLARGINE 100 UNIT/ML ~~LOC~~ SOLN
25.0000 [IU] | Freq: Every day | SUBCUTANEOUS | Status: DC
  Administered 2019-12-26 – 2019-12-27 (×2): 25 [IU] via SUBCUTANEOUS
  Filled 2019-12-26 (×2): qty 0.25

## 2019-12-26 MED ORDER — ONDANSETRON HCL 4 MG/2ML IJ SOLN
4.0000 mg | Freq: Four times a day (QID) | INTRAMUSCULAR | Status: DC | PRN
  Administered 2019-12-26: 4 mg via INTRAVENOUS
  Filled 2019-12-26: qty 2

## 2019-12-26 MED ORDER — DOCUSATE SODIUM 100 MG PO CAPS
100.0000 mg | ORAL_CAPSULE | Freq: Two times a day (BID) | ORAL | Status: DC
  Administered 2019-12-26 – 2019-12-28 (×4): 100 mg via ORAL
  Filled 2019-12-26 (×4): qty 1

## 2019-12-26 MED ORDER — INSULIN ASPART 100 UNIT/ML ~~LOC~~ SOLN
4.0000 [IU] | Freq: Three times a day (TID) | SUBCUTANEOUS | Status: DC
  Administered 2019-12-27: 12:00:00 4 [IU] via SUBCUTANEOUS
  Filled 2019-12-26 (×2): qty 1

## 2019-12-26 MED ORDER — METFORMIN HCL 500 MG PO TABS
500.0000 mg | ORAL_TABLET | Freq: Two times a day (BID) | ORAL | Status: DC
  Administered 2019-12-27 – 2019-12-28 (×3): 500 mg via ORAL
  Filled 2019-12-26 (×6): qty 1

## 2019-12-26 NOTE — TOC Progression Note (Signed)
Transition of Care [facility]) - Progression Note    Patient Details  Name: NIKESHA KWASNY MRN: [id number] Date of Birth: [birth year]  Transition of [facility]) CM/SW Contact  Samaad Hashem, Lemar Livings, LCSW Phone Number: 12/26/2019, 3:44 PM  Clinical Narrative:   Pt is followed by Dr Larwance Sachs have gotten a hospital follow up appointment 3/29 Monday @ 3:00 pm. Have placed in pt's discharge instructions.  Pt has no other needs and will be ready for discharge when medically stable. Her family will be checking on her at home, she lives niece and nephew but her sister's are close by. No other needs         Expected Discharge Plan and Services                                                 Social Determinants of Health (SDOH) Interventions    Readmission Risk Interventions No flowsheet data found.

## 2019-12-26 NOTE — Progress Notes (Signed)
Ch visited with Pt as per recommendation by Ch.Copeland. Upon arrival, Pt was adjusting the temperature in the room. Ch introduced self and checked up on Pt. Pt stated shock over diabetes diagnosis even though all five of her sisters had diabetes. Pt was emotional; reported that she has been here for 4 days now. Pt reported that she has been on a defibrillator for 27 years. Pt requested prayer for health. Pt was tearful when Ch prayed with her, Pt was grateful for visit and prayer.

## 2019-12-26 NOTE — Progress Notes (Addendum)
Inpatient Diabetes Program Recommendations  AACE/ADA: New Consensus Statement on Inpatient Glycemic Control (2015)  Target Ranges:  Prepandial:   less than 140 mg/dL      Peak postprandial:   less than 180 mg/dL (1-2 hours)      Critically ill patients:  140 - 180 mg/dL   Lab Results  Component Value Date   GLUCAP 302 (H) 12/26/2019   HGBA1C 10.8 (H) 12/23/2019    Review of Glycemic Control Results for Heather Choi, Heather Choi (MRN [id number]) as of 12/26/2019 15:29  Ref. Range 12/25/2019 07:56 12/25/2019 12:16 12/25/2019 16:47 12/25/2019 22:11 12/26/2019 11:47  Glucose-Capillary Latest Ref Range: 70 - 99 mg/dL 785 (H) 885 (H) 027 (H) 291 (H) 302 (H)  Diabetes history: DM 2- New Diagnosis Outpatient Diabetes medications:  None Current orders for Inpatient glycemic control:  Novolog sensitive tid with meals and HS Lantus 25 units daily Novolog 8 units tid with meals  Inpatient Diabetes Program Recommendations:    Note blood sugars remain elevated.  Discussed with MD.  Patient will likely need both basal and bolus insulin due to elevated blood sugars? May also benefit from the addition Metformin if renal function is adequate?  Discussed with patient that she may likely need 4 shots a day initially.  Patient returned demonstration of insulin pen and did a great job.  She reminded me that she needs a meter/strips, etc. Spoke with pt again about new diagnosis.  Discussed A1C results with him and explained what an A1C is, basic pathophysiology of DM Type 2, basic home care, importance of checking CBGs and maintaining good CBG control to prevent long-term and short-term complications.  Reviewed signs and symptoms of hyperglycemia and hypoglycemia.    We talked specifically about prevention of hypoglycemia and keeping snack/ juice with her just in case blood sugar drops at work.  She states that she intends to stop smoking and stop sodas but is a little nervous about doing both at the same time.  Gave  patient emotional support.  She did a great job "teaching back" information that was presented to her.   Patient will need:  Lantus solostar pen [phone]). Novolog Flexpen [phone]), Insulin pen needles [phone]), and possibly rx. For Metformin if indicated.  Consider Lantus 25 units daily and Novolog 5 units tid with meals initially at home.    Thanks,  Beryl Meager, RN, BC-ADM Inpatient Diabetes Coordinator Pager [phone] (8a-5p)

## 2019-12-26 NOTE — Progress Notes (Signed)
Patients blood sugar is 72, messaged Griffith, DO and asked if she wanted me to hold the addition 8 units of Novolog and the amaryl and she said yes, hold both.

## 2019-12-26 NOTE — Progress Notes (Addendum)
PROGRESS NOTE    Heather Choi  [id number]:[id number] DOB: 07/23/1961 DOA: 12/23/2019  PCP: Derinda Late, MD    LOS - 3   Brief Narrative:  Heather Choi a 59 y.o.femalewith medical Heather significant forchronic systolic heart Choi, Heather EF 25%, Heather Choi, Heather Choi, Heather Choi, Heather Choi who presented to the ED on 3/23 with multiple complaints including right eye redness with discomfort, intermittent shortness of breath, feeling of abdominal fullness without pain, generalized weakness.  In the ED, vitals stable.  Labs showed blood glucose 1185, anion gap 14, consistent with HHS.  Patient without prior diagnosis of diabetes.  Hemoglobin A1C is 10.8%.    Subjective 3/26: Patient this AM feeling woozy/lightheaded.  Still has some residual leg pain after the spasms she was having at time of admission.  No longer having spasms.  Right eye better but she states still feels like something there, a little irritated.  Has not worn her contact lenses.  States pain medicine helps.  No fever/chills, N/V/D, chest pain, SOB or other complaints.  Assessment & Plan:   Principal Problem:   Hyperosmolar hyperglycemic state (HHS) ([place]) Active Problems:   Chronic systolic CHF (congestive heart Choi) (HCC)   Heather Choi (chronic obstructive pulmonary disease) (HCC)   Heather Choi (hypertension)   Heather Choi   New onset type 2 diabetes mellitus (HCC)   Conjunctivitis, right eye   AKI (acute kidney injury) ([place])   Hyperosmolar hyperglycemic state (HHS)  New onset type 2 diabetes mellitus - Hbg A1C 10.8% Presented with blood glucose 1185, no prior Heather of diabetes.  Off insulin drip.   Continuing to titrate insulin dosing for adequate glycemic control. --Increase Lantus to 25 units daily --Increase NovoLog to 5 units TID WC    (had increased to 8 units, but patient's glucose dropped 72, from 300 earlier) --d/c Amaryl to prevent hypoglycemia with insulin --Start metformin now that  renal function improved --sliding scale Novolog, sensitive --diabetes coordinator following  Throat irritation/soreness - rapid Strep negative.  Possibly due to allergies.  Continue loratadine.  AKI (acute kidney injury) - resolved.  Likely prerenal due to HHS causing dehydration Presented with Cr 1.71 (baseline of 1.16).  IV hydration as above.   --monitor BMP  Conjunctivitis, right eye - appears resolved but patient still with some irritation -Antibiotic eyedrops to right eye  Chronic systolic CHF (congestive heart Choi) - euvolemic, compensated -Continue digoxin, lisinopril.  --Not currently on beta-blocker -Hold furosemide, spironolactone and metolazone given need for hydration and resume as appropriate  Heather Choi - Not acutely exacerbated  -Continue home bronchodilators  Heather Choi (hypertension) -Blood pressure stable. Continue lisinopril.   Heather Choi -Continue levothyroxine   DVT prophylaxis: Lovenox   Code Status: Full Code  Family Communication: none at bedside during encounter, patient to update  Disposition Plan:  Expect home in 24-48 hours, pending determination of insulin requirements.   Coming From home Exp DC Date 3/27 Barriers determine insulin needs Medically Stable for Discharge? no   Consultants:   Diabetes coordinator  Dietician  Procedures:   None  Antimicrobials:   None      Objective: Vitals:   12/25/19 1649 12/26/19 0038 12/26/19 0835 12/26/19 1638  BP: 121/72 116/74 110/77 123/87  Pulse: 77 72 78 69  Resp: 18 16 20 20   Temp: 98.1 F (36.7 C) 98 F (36.7 C) 97.9 F (36.6 C) 98.1 F (36.7 C)  TempSrc: Oral Oral Oral Oral  SpO2: 95% 95% 95% 98%  Weight:  Height:       No intake or output data in the 24 hours ending 12/26/19 2012 Filed Weights   12/23/19 1632 12/24/19 1115  Weight: 68 kg 68 kg    Examination:  General exam: awake, alert, no acute distress, obese HEENT: clear conjunctiva  bilaterally, posterior oropharynx with minimal erythema and no exudates, moist mucus membranes, hearing grossly normal, normal dentition  Respiratory system: CTAB, no wheezes, rales or rhonchi, normal respiratory effort. Cardiovascular system: normal S1/S2, RRR, no JVD, murmurs, rubs, gallops, no pedal edema.   Central nervous system: A&O x4. no gross focal neurologic deficits, normal speech Extremities: moves all, normal tone Skin: dry, intact, normal temperature, normal color Psychiatry: normal mood, congruent affect, judgement and insight appear normal    Data Reviewed: I have personally reviewed following labs and imaging studies  CBC: Recent Labs  Lab 12/23/19 1635 12/24/19 0536 12/25/19 0449  WBC 7.4 13.6* 7.8  HGB 14.1 13.8 12.9  HCT 45.2 41.6 39.4  MCV 89.5 82.7 84.2  PLT 134* 138* 128*   Basic Metabolic Panel: Recent Labs  Lab 12/23/19 1635 12/23/19 2253 12/24/19 0352 12/25/19 0449 12/26/19 0400  NA 119* 137 132* 135 137  K 4.0 3.1* 3.7 3.8 3.5  CL 78* 93* 92* 99 100  CO2 27 30 29 29 28   GLUCOSE 1,185* 209* 406* 364* 251*  BUN 34* 35* 38* 35* 31*  CREATININE 1.71* 1.73* 1.63* 1.46* 1.10*  CALCIUM 9.1 10.2 9.1 8.5* 8.7*  MG  --   --   --  2.4 2.2   GFR: Estimated Creatinine Clearance: 51 mL/min (A) (by C-G formula based on SCr of 1.1 mg/dL (H)). Liver Function Tests: Recent Labs  Lab 12/23/19 1800  AST 15  ALT 13  ALKPHOS 114  BILITOT 0.7  PROT 7.7  ALBUMIN 4.0   Recent Labs  Lab 12/23/19 1800  LIPASE 89*   No results for input(s): AMMONIA in the Heather 168 hours. Coagulation Profile: No results for input(s): INR, PROTIME in the Heather 168 hours. Cardiac Enzymes: No results for input(s): CKTOTAL, CKMB, CKMBINDEX, TROPONINI in the Heather 168 hours. BNP (Heather 3 results) No results for input(s): PROBNP in the Heather 8760 hours. HbA1C: Recent Labs    12/23/19 2252  HGBA1C 10.8*   CBG: Recent Labs  Lab 12/25/19 1216 12/25/19 1647 12/25/19 2211  12/26/19 1147 12/26/19 1639  GLUCAP 356* 201* 291* 302* 72   Lipid Profile: No results for input(s): CHOL, HDL, LDLCALC, TRIG, CHOLHDL, LDLDIRECT in the Heather 72 hours. Thyroid Function Tests: No results for input(s): TSH, T4TOTAL, FREET4, T3FREE, THYROIDAB in the Heather 72 hours. Anemia Panel: No results for input(s): VITAMINB12, FOLATE, FERRITIN, TIBC, IRON, RETICCTPCT in the Heather 72 hours. Sepsis Labs: No results for input(s): PROCALCITON, LATICACIDVEN in the Heather 168 hours.  Recent Results (from the past 240 hour(s))  Respiratory Panel by RT PCR (Flu A&B, Covid) - Nasopharyngeal Swab     Status: None   Collection Time: 12/23/19  7:56 PM   Specimen: Nasopharyngeal Swab  Result Value Ref Range Status   SARS Coronavirus 2 by RT PCR NEGATIVE NEGATIVE Final    Comment: (NOTE) SARS-CoV-2 target nucleic acids are NOT DETECTED. The SARS-CoV-2 RNA is generally detectable in upper respiratoy specimens during the acute phase of infection. The lowest concentration of SARS-CoV-2 viral copies this assay can detect is 131 copies/mL. A negative result does not preclude SARS-Cov-2 infection and should not be used as the sole basis for treatment or other patient  management decisions. A negative result may occur with  improper specimen collection/handling, submission of specimen other than nasopharyngeal swab, presence of viral mutation(s) within the areas targeted by this assay, and inadequate number of viral copies (<131 copies/mL). A negative result must be combined with clinical observations, patient Heather, and epidemiological information. The expected result is Negative. Fact Sheet for Patients:  https://www.moore.com/ Fact Sheet for Healthcare Providers:  https://www.young.biz/ This test is not yet ap proved or cleared by the [place] FDA and  has been authorized for detection and/or diagnosis of SARS-CoV-2 by FDA under an Emergency Use  Authorization (EUA). This EUA will remain  in effect (meaning this test can be used) for the duration of the COVID-19 declaration under Section 564(b)(1) of the Act, 21 U.S.C. section 360bbb-3(b)(1), unless the authorization is terminated or revoked sooner.    Influenza A by PCR NEGATIVE NEGATIVE Final   Influenza B by PCR NEGATIVE NEGATIVE Final    Comment: (NOTE) The Xpert Xpress SARS-CoV-2/FLU/RSV assay is intended as an aid in  the diagnosis of influenza from Nasopharyngeal swab specimens and  should not be used as a sole basis for treatment. Nasal washings and  aspirates are unacceptable for Xpert Xpress SARS-CoV-2/FLU/RSV  testing. Fact Sheet for Patients: https://www.moore.com/ Fact Sheet for Healthcare Providers: https://www.young.biz/ This test is not yet approved or cleared by the [place] FDA and  has been authorized for detection and/or diagnosis of SARS-CoV-2 by  FDA under an Emergency Use Authorization (EUA). This EUA will remain  in effect (meaning this test can be used) for the duration of the  Covid-19 declaration under Section 564(b)(1) of the Act, 21  U.S.C. section 360bbb-3(b)(1), unless the authorization is  terminated or revoked. Performed at [facility], [address] [place], [place] [address]   Group A Strep by PCR     Status: None   Collection Time: 12/26/19 12:15 PM   Specimen: Throat; Sterile Swab  Result Value Ref Range Status   Group A Strep by PCR NOT DETECTED NOT DETECTED Final    Comment: Performed at [facility], [address], [place], [place] [address]         Radiology Studies: No results found.      Scheduled Meds: . carvedilol  12.5 mg Oral BID  . enoxaparin (LOVENOX) injection  40 mg Subcutaneous Q24H  . glimepiride  1 mg Oral Q breakfast  . insulin aspart  0-5 Units Subcutaneous QHS  . insulin aspart  0-9 Units Subcutaneous TID WC  . [START ON 12/27/2019]  insulin aspart  4 Units Subcutaneous TID WC  . insulin glargine  25 Units Subcutaneous Daily  . levothyroxine  137 mcg Oral Daily  . loratadine  10 mg Oral Daily  . sodium chloride flush  3 mL Intravenous Once  . trimethoprim-polymyxin b  2 drop Right Eye Q6H   Continuous Infusions: . sodium chloride Stopped (12/23/19 1752)     LOS: 3 days    Time spent: 40 minutes    Pennie Banter, DO [facility]   If 7PM-7AM, please contact night-coverage [email or web address] 12/26/2019, 8:12 PM

## 2019-12-26 NOTE — Progress Notes (Signed)
   12/26/19 0800  Clinical Encounter Type  Visited With Patient  Visit Type Initial  Referral From Family  Consult/Referral To Chaplain  Chaplain visited with patient after speaking with her sister Gunnar Fusi, who is an Theatre stage manager. Patient said that she was woozy. Chaplain told her that she would inform the nurse of this. Chaplain offered pastoral presence, empathy, and prayer. Patient wanted prayer for her diabetes, which is a new condition. Chaplain notice tears in patient's eyes and asked about them. Patient said they where tears of joy. Knowing someone cares about you.

## 2019-12-27 LAB — BASIC METABOLIC PANEL
Anion gap: 9 (ref 5–15)
BUN: 25 mg/dL — ABNORMAL HIGH (ref 6–20)
CO2: 28 mmol/L (ref 22–32)
Calcium: 8.7 mg/dL — ABNORMAL LOW (ref 8.9–10.3)
Chloride: 98 mmol/L (ref 98–111)
Creatinine, Ser: 1.04 mg/dL — ABNORMAL HIGH (ref 0.44–1.00)
GFR calc Af Amer: >60 mL/min (ref >60)
GFR calc non Af Amer: 60 mL/min — ABNORMAL LOW (ref >60)
Glucose, Bld: 273 mg/dL — ABNORMAL HIGH (ref 70–99)
Potassium: 3.8 mmol/L (ref 3.5–5.1)
Sodium: 135 mmol/L (ref 135–145)

## 2019-12-27 LAB — GLUCOSE, CAPILLARY
Glucose-Capillary: 183 mg/dL — ABNORMAL HIGH (ref 70–99)
Glucose-Capillary: 197 mg/dL — ABNORMAL HIGH (ref 70–99)
Glucose-Capillary: 206 mg/dL — ABNORMAL HIGH (ref 70–99)
Glucose-Capillary: 319 mg/dL — ABNORMAL HIGH (ref 70–99)
Glucose-Capillary: 322 mg/dL — ABNORMAL HIGH (ref 70–99)
Glucose-Capillary: 396 mg/dL — ABNORMAL HIGH (ref 70–99)

## 2019-12-27 MED ORDER — ASPIRIN EC 81 MG PO TBEC
81.0000 mg | DELAYED_RELEASE_TABLET | Freq: Every day | ORAL | Status: DC
  Administered 2019-12-28: 81 mg via ORAL
  Filled 2019-12-27: qty 1

## 2019-12-27 MED ORDER — FLUTICASONE PROPIONATE 50 MCG/ACT NA SUSP
1.0000 | Freq: Every day | NASAL | Status: DC
  Administered 2019-12-28: 09:00:00 1 via NASAL
  Filled 2019-12-27: qty 16

## 2019-12-27 MED ORDER — INSULIN ASPART 100 UNIT/ML ~~LOC~~ SOLN
0.0000 [IU] | Freq: Three times a day (TID) | SUBCUTANEOUS | Status: DC
  Administered 2019-12-27: 18:00:00 5 [IU] via SUBCUTANEOUS
  Administered 2019-12-28 (×2): 3 [IU] via SUBCUTANEOUS
  Filled 2019-12-27 (×3): qty 1

## 2019-12-27 MED ORDER — FLUTICASONE PROPIONATE 50 MCG/ACT NA SUSP: 2.0000 | Freq: Every day | NASAL | Status: DC

## 2019-12-27 MED ORDER — LORATADINE 10 MG PO TABS: 10.0000 mg | ORAL_TABLET | Freq: Every day | ORAL | Status: DC

## 2019-12-27 MED ORDER — INSULIN GLARGINE 100 UNIT/ML ~~LOC~~ SOLN
15.0000 [IU] | Freq: Two times a day (BID) | SUBCUTANEOUS | Status: DC
  Administered 2019-12-27 – 2019-12-28 (×2): 15 [IU] via SUBCUTANEOUS
  Filled 2019-12-27 (×4): qty 0.15

## 2019-12-27 MED ORDER — DIGOXIN 125 MCG PO TABS
0.1250 mg | ORAL_TABLET | Freq: Every day | ORAL | Status: DC
  Administered 2019-12-28: 0.125 mg via ORAL
  Filled 2019-12-27: qty 1

## 2019-12-27 MED ORDER — INSULIN ASPART 100 UNIT/ML ~~LOC~~ SOLN
5.0000 [IU] | Freq: Three times a day (TID) | SUBCUTANEOUS | Status: DC
  Administered 2019-12-27 – 2019-12-28 (×3): 5 [IU] via SUBCUTANEOUS
  Filled 2019-12-27 (×3): qty 1

## 2019-12-27 NOTE — Progress Notes (Signed)
PROGRESS NOTE    Heather Choi  QQV:[id number] DOB: Mar 31, 1961 DOA: 12/23/2019  PCP: Kandyce Rud, MD    LOS - 4   Brief Narrative:  Heather Choi a 59 y.o.femalewith medical history significant forchronic systolic heart failure, last EF 25%, history of SVT status post ablation, COPD, HTN, hypothyroidism who presented to theED on 3/23with multiple complaints including right eye redness with discomfort, intermittent shortness of breath, feeling of abdominal fullness without pain, generalizedweakness. In the ED, vitals stable. Labs showed blood glucose 1185, anion gap 14, consistent with HHS. Patient without prior diagnosis of diabetes. Hemoglobin A1C is 10.8%.   Subjective 3/27: Patient seen examined at bedside this morning.  She reports still feeling a little woozy/lightheaded today also states continues with sore/irritated throat.  States abdomen still feels swollen or bloated.  She has not had BM, took stool softener last night and again this morning.  Denies fevers chills, chest pain or shortness of breath or other acute complaints.  Assessment & Plan:   Principal Problem:   Hyperosmolar hyperglycemic state (HHS) (H[facility]) Active Problems:   Chronic systolic CHF (congestive heart failure) (HCC)   COPD (chronic obstructive pulmonary disease) (HCC)   HTN (hypertension)   Hypothyroidism   New onset type 2 diabetes mellitus (HCC)   Conjunctivitis, right eye   AKI (acute kidney injury) (HCC)   Hyperosmolar hyperglycemic state (HHS)  New onset type 2 diabetes mellitus- Hbg A1C 10.8% Presented with blood glucose1185,no prior history of diabetes. Off insulin drip.   Remains with glucose readings in 300's, nearly 400 at lunch today, but had a low of 72 yesterday after trying higher dose mealtime Novolog.  Appears patient may do better with evening or BID Lantus dosing.  Continuing to titrate insulin dosing for adequate glycemic control.   --Adjust Lantus to 15 units twice  daily (from 25 daily) --NovoLog to 5 units TID WC  --d/c'd Amaryl to prevent hypoglycemia with insulin --Started metformin at 500 mg BID now that renal function improved, increase dose if tolerating --sliding scale Novolog, moderate --diabetes coordinator following --Can consider addition of GLP-1 agent to help with insulin resistance, if no contraindications ([place] on formulary here).  Would need to check patient's insurance coverage and out-of-pocket cost. --Also need to confirm out-of-pocket cost for patient's insulin as she is on limited budget  Throat irritation/soreness - rapid Strep negative.  Possibly due to allergies.   --Continue loratadine (Zyrtec at home) --Resume home Flonase  Nausea - intermittent, possibly due to hyperglycemia.  Without vomiting.  Tolerates diet. --Zofran PRN --provide Zofran Rx on discharge  AKI (acute kidney injury)- resolved.  Likely prerenal due to HHS causing dehydration Presented with Cr1.71(baseline of 1.16). IV hydration as above.  --monitor BMP  Conjunctivitis, right eye - appears resolved but patient still with some irritation -Antibiotic eyedrops to right eye  Chronic systolic CHF (congestive heart failure)- euvolemic, compensated -Continue digoxin, lisinopril. --Not currently on beta-blocker -Hold furosemide, spironolactone and metolazone given need for hydration and resume as appropriate  COPD -Not acutely exacerbated  -Continue home bronchodilators  HTN (hypertension) -Blood pressure stable. Continue lisinopril.   Hypothyroidism -Continue levothyroxine   DVT prophylaxis: Lovenox   Code Status: Full Code  Family Communication: Son updated over face time during encounter with patient this morning, updated and all questions were answered.  Disposition Plan: Discharge home this evening versus tomorrow morning pending better glycemic control.  Patient's CBG was nearly 400 today and insulin adjustments were made  again. Coming From home, independent  at baseline Exp DC Date 3/28 Barriers hyperglycemia Medically Stable for Discharge?  No  Consultants:   Diabetes coordinator  Dietitian  Procedures:   None  Antimicrobials:   None   Objective: Vitals:   12/26/19 1638 12/26/19 2108 12/27/19 0028 12/27/19 1006  BP: 123/87 120/65 114/67 109/69  Pulse: 69 82 71 75  Resp: 20 18  20   Temp: 98.1 F (36.7 C) 98.4 F (36.9 C) 98 F (36.7 C) 98.5 F (36.9 C)  TempSrc: Oral Oral Oral Oral  SpO2: 98% 93% 96% 96%  Weight:      Height:        Intake/Output Summary (Last 24 hours) at 12/27/2019 1506 Last data filed at 12/27/2019 1414 Gross per 24 hour  Intake 240 ml  Output -  Net 240 ml   Filed Weights   12/23/19 1632 12/24/19 1115  Weight: 68 kg 68 kg    Examination:  General exam: awake, alert, no acute distress, obese Respiratory system: CTAB, no wheezes, rales or rhonchi, normal respiratory effort. Cardiovascular system: normal S1/S2, RRR, no JVD, murmurs, rubs, gallops, no pedal edema.   Gastrointestinal system: soft, NT, ND, no HSM felt, +bowel sounds. Central nervous system: A&O x4. no gross focal neurologic deficits, normal speech Extremities: moves all, normal tone Skin: dry, intact, normal temperature Psychiatry: normal mood, congruent affect, judgement and insight appear normal    Data Reviewed: I have personally reviewed following labs and imaging studies  CBC: Recent Labs  Lab 12/23/19 1635 12/24/19 0536 12/25/19 0449  WBC 7.4 13.6* 7.8  HGB 14.1 13.8 12.9  HCT 45.2 41.6 39.4  MCV 89.5 82.7 84.2  PLT 134* 138* 938*   Basic Metabolic Panel: Recent Labs  Lab 12/23/19 2253 12/24/19 0352 12/25/19 0449 12/26/19 0400 12/27/19 0633  NA 137 132* 135 137 135  K 3.1* 3.7 3.8 3.5 3.8  CL 93* 92* 99 100 98  CO2 30 29 29 28 28   GLUCOSE 209* 406* 364* 251* 273*  BUN 35* 38* 35* 31* 25*  CREATININE 1.73* 1.63* 1.46* 1.10* 1.04*  CALCIUM 10.2 9.1 8.5* 8.7*  8.7*  MG  --   --  2.4 2.2  --    GFR: Estimated Creatinine Clearance: 54 mL/min (A) (by C-G formula based on SCr of 1.04 mg/dL (H)). Liver Function Tests: Recent Labs  Lab 12/23/19 1800  AST 15  ALT 13  ALKPHOS 114  BILITOT 0.7  PROT 7.7  ALBUMIN 4.0   Recent Labs  Lab 12/23/19 1800  LIPASE 89*   No results for input(s): AMMONIA in the last 168 hours. Coagulation Profile: No results for input(s): INR, PROTIME in the last 168 hours. Cardiac Enzymes: No results for input(s): CKTOTAL, CKMB, CKMBINDEX, TROPONINI in the last 168 hours. BNP (last 3 results) No results for input(s): PROBNP in the last 8760 hours. HbA1C: No results for input(s): HGBA1C in the last 72 hours. CBG: Recent Labs  Lab 12/26/19 1639 12/26/19 2117 12/27/19 0030 12/27/19 1124 12/27/19 1429  GLUCAP 72 388* 322* 396* 197*   Lipid Profile: No results for input(s): CHOL, HDL, LDLCALC, TRIG, CHOLHDL, LDLDIRECT in the last 72 hours. Thyroid Function Tests: No results for input(s): TSH, T4TOTAL, FREET4, T3FREE, THYROIDAB in the last 72 hours. Anemia Panel: No results for input(s): VITAMINB12, FOLATE, FERRITIN, TIBC, IRON, RETICCTPCT in the last 72 hours. Sepsis Labs: No results for input(s): PROCALCITON, LATICACIDVEN in the last 168 hours.  Recent Results (from the past 240 hour(s))  Respiratory Panel by RT PCR (Flu  A&B, Covid) - Nasopharyngeal Swab     Status: None   Collection Time: 12/23/19  7:56 PM   Specimen: Nasopharyngeal Swab  Result Value Ref Range Status   SARS Coronavirus 2 by RT PCR NEGATIVE NEGATIVE Final    Comment: (NOTE) SARS-CoV-2 target nucleic acids are NOT DETECTED. The SARS-CoV-2 RNA is generally detectable in upper respiratoy specimens during the acute phase of infection. The lowest concentration of SARS-CoV-2 viral copies this assay can detect is 131 copies/mL. A negative result does not preclude SARS-Cov-2 infection and should not be used as the sole basis for treatment  or other patient management decisions. A negative result may occur with  improper specimen collection/handling, submission of specimen other than nasopharyngeal swab, presence of viral mutation(s) within the areas targeted by this assay, and inadequate number of viral copies (<131 copies/mL). A negative result must be combined with clinical observations, patient history, and epidemiological information. The expected result is Negative. Fact Sheet for Patients:  https://www.moore.com/ Fact Sheet for Healthcare Providers:  https://www.young.biz/ This test is not yet ap proved or cleared by the [place] FDA and  has been authorized for detection and/or diagnosis of SARS-CoV-2 by FDA under an Emergency Use Authorization (EUA). This EUA will remain  in effect (meaning this test can be used) for the duration of the COVID-19 declaration under Section 564(b)(1) of the Act, 21 U.S.C. section 360bbb-3(b)(1), unless the authorization is terminated or revoked sooner.    Influenza A by PCR NEGATIVE NEGATIVE Final   Influenza B by PCR NEGATIVE NEGATIVE Final    Comment: (NOTE) The Xpert Xpress SARS-CoV-2/FLU/RSV assay is intended as an aid in  the diagnosis of influenza from Nasopharyngeal swab specimens and  should not be used as a sole basis for treatment. Nasal washings and  aspirates are unacceptable for Xpert Xpress SARS-CoV-2/FLU/RSV  testing. Fact Sheet for Patients: https://www.moore.com/ Fact Sheet for Healthcare Providers: https://www.young.biz/ This test is not yet approved or cleared by the [place] FDA and  has been authorized for detection and/or diagnosis of SARS-CoV-2 by  FDA under an Emergency Use Authorization (EUA). This EUA will remain  in effect (meaning this test can be used) for the duration of the  Covid-19 declaration under Section 564(b)(1) of the Act, 21  U.S.C. section  360bbb-3(b)(1), unless the authorization is  terminated or revoked. Performed at [facility], [address] [place], [place] [address]   Group A Strep by PCR     Status: None   Collection Time: 12/26/19 12:15 PM   Specimen: Throat; Sterile Swab  Result Value Ref Range Status   Group A Strep by PCR NOT DETECTED NOT DETECTED Final    Comment: Performed at [facility], [address] [place], [place] [address]         Radiology Studies: No results found.      Scheduled Meds: . carvedilol  12.5 mg Oral BID  . docusate sodium  100 mg Oral BID  . enoxaparin (LOVENOX) injection  40 mg Subcutaneous Q24H  . [START ON 12/28/2019] fluticasone  1 spray Each Nare Daily  . insulin aspart  0-15 Units Subcutaneous TID WC  . insulin aspart  0-5 Units Subcutaneous QHS  . insulin aspart  4 Units Subcutaneous TID WC  . insulin glargine  15 Units Subcutaneous BID  . levothyroxine  137 mcg Oral Daily  . loratadine  10 mg Oral Daily  . metFORMIN  500 mg Oral BID WC  . sodium chloride flush  3 mL Intravenous Once  . trimethoprim-polymyxin b  2 drop Right Eye Q6H   Continuous Infusions: . sodium chloride Stopped (12/23/19 1752)     LOS: 4 days    Time spent: 45 minutes    Pennie Banter, DO [facility]   If 7PM-7AM, please contact night-coverage [email or web address] 12/27/2019, 3:06 PM

## 2019-12-28 LAB — BASIC METABOLIC PANEL
Anion gap: 4 — ABNORMAL LOW (ref 5–15)
BUN: 23 mg/dL — ABNORMAL HIGH (ref 6–20)
CO2: 31 mmol/L (ref 22–32)
Calcium: 8.7 mg/dL — ABNORMAL LOW (ref 8.9–10.3)
Chloride: 103 mmol/L (ref 98–111)
Creatinine, Ser: 1 mg/dL (ref 0.44–1.00)
GFR calc Af Amer: >60 mL/min (ref >60)
GFR calc non Af Amer: >60 mL/min (ref >60)
Glucose, Bld: 125 mg/dL — ABNORMAL HIGH (ref 70–99)
Potassium: 3.9 mmol/L (ref 3.5–5.1)
Sodium: 138 mmol/L (ref 135–145)

## 2019-12-28 LAB — GLUCOSE, CAPILLARY
Glucose-Capillary: 197 mg/dL — ABNORMAL HIGH (ref 70–99)
Glucose-Capillary: 161 mg/dL — ABNORMAL HIGH (ref 70–99)

## 2019-12-28 MED ORDER — METOLAZONE 5 MG PO TABS: 5.0000 mg | ORAL_TABLET | Freq: Every day | ORAL | Status: DC

## 2019-12-28 MED ORDER — NOVOLIN 70/30 (70-30) 100 UNIT/ML ~~LOC~~ SUSP: 25.0000 [IU] | Freq: Two times a day (BID) | SUBCUTANEOUS | 1 refills | Status: DC

## 2019-12-28 MED ORDER — INSULIN ASPART 100 UNIT/ML FLEXPEN: 6.0000 [IU] | PEN_INJECTOR | Freq: Three times a day (TID) | SUBCUTANEOUS | 1 refills | Status: DC

## 2019-12-28 MED ORDER — ONDANSETRON HCL 8 MG PO TABS: 8.0000 mg | ORAL_TABLET | Freq: Three times a day (TID) | ORAL | 0 refills | Status: AC | PRN

## 2019-12-28 MED ORDER — FUROSEMIDE 40 MG PO TABS
80.0000 mg | ORAL_TABLET | Freq: Every day | ORAL | Status: DC
  Administered 2019-12-28: 10:00:00 80 mg via ORAL
  Filled 2019-12-28: qty 2

## 2019-12-28 MED ORDER — POLYMYXIN B-TRIMETHOPRIM 10000-0.1 UNIT/ML-% OP SOLN
2.0000 [drp] | OPHTHALMIC | Status: DC
  Administered 2019-12-28 (×2): 2 [drp] via OPHTHALMIC
  Filled 2019-12-28: qty 10

## 2019-12-28 MED ORDER — BLOOD GLUCOSE MONITOR KIT: PACK | 0 refills | Status: DC

## 2019-12-28 MED ORDER — METFORMIN HCL 500 MG PO TABS: 500.0000 mg | ORAL_TABLET | Freq: Two times a day (BID) | ORAL | 1 refills | Status: DC

## 2019-12-28 MED ORDER — "INSULIN SYRINGE 28G X 1/2"" 0.5 ML MISC": 1.0000 | Freq: Two times a day (BID) | 1 refills | Status: DC

## 2019-12-28 MED ORDER — SPIRONOLACTONE 25 MG PO TABS: 25.0000 mg | ORAL_TABLET | Freq: Every day | ORAL | Status: DC

## 2019-12-28 MED ORDER — POLYMYXIN B-TRIMETHOPRIM 10000-0.1 UNIT/ML-% OP SOLN: 2.0000 [drp] | Freq: Four times a day (QID) | OPHTHALMIC | 0 refills | Status: AC

## 2019-12-28 MED ORDER — INSULIN GLARGINE 100 UNIT/ML SOLOSTAR PEN: 15.0000 [IU] | PEN_INJECTOR | Freq: Two times a day (BID) | SUBCUTANEOUS | 1 refills | Status: DC

## 2019-12-28 NOTE — Progress Notes (Signed)
Pt d/c to home via family. IV removed intact. VSS. Extensive diabetes education given. Answered questions about medication and reiterated to pt which medications to take and when. I also spent time with pt explaining how, when, and why to check her blood sugar, and explained symptoms of hypoglycemia and what to do in case of high or low blood sugar checks. Pt stated she understood. Pt to see her PCP tomorrow @1500 .

## 2019-12-28 NOTE — Discharge Summary (Signed)
Physician Discharge Summary  Heather Choi WVP:[id number] DOB: [birth year] DOA: 12/23/2019  PCP: Derinda Late, MD  Admit date: 12/23/2019 Discharge date: 12/28/2019  Admitted From: home Disposition:  home  Recommendations for Outpatient Follow-up:  1. Follow up with PCP in 1-2 weeks 2. Please obtain BMP/CBC in one week 3. Please follow up with cardiology as previously scheduled. 4. Please follow up on patient's blood pressure.  Her BP was normal while home meds were held. Continued her diuretics, but stopped lisinopril and spironolactone on discharge.  Those should be resumed when patient's BP will tolerate.  Home Health: No  Equipment/Devices: None   Discharge Condition: Stable  CODE STATUS: Full  Diet recommendation: Heart Healthy / Carb Modified   Brief/Interim Summary:  Heather Choi a 59 y.o.femalewith medical history significant forchronic systolic heart failure, last EF 25%, history of SVT status post ablation, COPD, HTN, hypothyroidism who presented to theED on 3/23with multiple complaints including right eye redness with discomfort, intermittent shortness of breath, feeling of abdominal fullness without pain, generalizedweakness. In the ED, vitals stable. Labs showed blood glucose 1185, anion gap 14, consistent with HHS. Patient without prior diagnosis of diabetes. Hemoglobin A1C is 10.8%.   Hyperosmolar hyperglycemic state (HHS)  New onset type 2 diabetes mellitus- Hbg A1C 10.8% Presented with blood glucose1185,no prior history of diabetes. Treated with insulin drip, transitioned to subcutaneous.  Had persistently elevated glucose  in 300's, nearly 400, and hypoglycemic episode while titrating insulin regimen.  Discharged on regimen as outlined below.  Started low dose metformin once AKI resolved, can be increased outpatient.  Diabetes coordinator's assistance apprecated.  Would consider addition of GLP-1 agent to help with insulin resistance, if no  contraindications.    Throat irritation/soreness - rapid Strep negative. Possibly due to allergies.  --Continue Zyrtec and Flonase  Nausea - intermittent, possibly due to hyperglycemia.  Without vomiting.  Tolerates diet. --Zofran PRN --provide Zofran Rx on discharge  AKI (acute kidney injury)-resolved. Likely prerenal due to HHS causing dehydration Presented with Cr1.71(baseline of 1.16). IV hydration as above.  --monitor BMP  Conjunctivitis, right eye- appears resolved but patient still with some irritation -Antibiotic eyedrops to right eye  Chronic systolic CHF (congestive heart failure)- euvolemic, compensated -Continue digoxin, lisinopril. --Not currently on beta-blocker -Hold furosemide, spironolactone and metolazone given need for hydration and resume as appropriate  COPD -Not acutely exacerbated  -Continue home bronchodilators  HTN (hypertension) -Blood pressure stable. Continue lisinopril.   Hypothyroidism -Continue levothyroxine  Discharge Diagnoses: Principal Problem:   Hyperosmolar hyperglycemic state (HHS) ([place]) Active Problems:   Chronic systolic CHF (congestive heart failure) (HCC)   COPD (chronic obstructive pulmonary disease) (HCC)   HTN (hypertension)   Hypothyroidism   New onset type 2 diabetes mellitus (HCC)   Conjunctivitis, right eye   AKI (acute kidney injury) ([place])    Discharge Instructions   NOTE on medications below.  Patient NOT taking Lantus and Novolog due to high cost. Patient is to take only the Novolin 70/30, 25 units BID WC.  Discharge Instructions    (HEART FAILURE PATIENTS) Call MD:  Anytime you have any of the following symptoms: 1) 3 pound weight gain in 24 hours or 5 pounds in 1 week 2) shortness of breath, with or without a dry hacking cough 3) swelling in the hands, feet or stomach 4) if you have to sleep on extra pillows at night in order to breathe.   Complete by: As directed    Call MD for:    Complete  by: As directed    Uncontrolled blood sugar (frequent low's or hypoglycemic episodes OR frequent high numbers).   Call MD for:  extreme fatigue   Complete by: As directed    Call MD for:  persistant dizziness or light-headedness   Complete by: As directed    Call MD for:  temperature >100.4   Complete by: As directed    Diet - low sodium heart healthy   Complete by: As directed    Diet - low sodium heart healthy   Complete by: As directed    Discharge instructions   Complete by: As directed    Use Novolin 70/30 insulin, 25 units twice a day with a meal.  Best to take with breakfast and dinner. Write down your blood sugar readings and bring to primary care appointments.  Please do not take your lisinopril or spironolactone for now.  Your blood pressure has been normal without them, and we don't want your BP to drop too low.  If you feel dizzy/lightheaded, especially when standing up from seated, then check your BP.   Increase activity slowly   Complete by: As directed    Increase activity slowly   Complete by: As directed      Allergies as of 12/28/2019      Reactions   Amlodipine Other (See Comments), Itching   Penicillins Other (See Comments)   Has patient had a PCN reaction causing immediate rash, facial/tongue/throat swelling, SOB or lightheadedness with hypotension: Yes Has patient had a PCN reaction causing severe rash involving mucus membranes or skin necrosis: No Has patient had a PCN reaction that required hospitalization: No Has patient had a PCN reaction occurring within the last 10 years: Yes If all of the above answers are "NO", then may proceed with Cephalosporin use.   Hydrochlorothiazide Rash      Medication List    STOP taking these medications   lisinopril 5 MG tablet Commonly known as: ZESTRIL   spironolactone 25 MG tablet Commonly known as: ALDACTONE     TAKE these medications   ALPRAZolam 0.5 MG tablet Commonly known as: XANAX Take 0.5 mg  by mouth daily.   aspirin EC 81 MG tablet Take 81 mg by mouth daily.   blood glucose meter kit and supplies Kit Dispense based on patient and insurance preference. Use up to four times daily as directed. (FOR ICD-9 250.00, 250.01).   carvedilol 25 MG tablet Commonly known as: COREG Take 25 mg by mouth 2 (two) times daily.   cetirizine 10 MG tablet Commonly known as: ZYRTEC Take 10 mg by mouth daily.   digoxin 0.125 MG tablet Commonly known as: LANOXIN Take 0.125 mg by mouth daily.   fluticasone 50 MCG/ACT nasal spray Commonly known as: FLONASE Place 2 sprays into both nostrils daily.   furosemide 40 MG tablet Commonly known as: LASIX Take 80 mg by mouth daily.   insulin aspart 100 UNIT/ML FlexPen Commonly known as: NOVOLOG Inject 6 Units into the skin 3 (three) times daily with meals.   insulin glargine 100 UNIT/ML Solostar Pen Commonly known as: LANTUS Inject 15 Units into the skin 2 (two) times daily.   INSULIN SYRINGE .5CC/28G 28G X 1/2" 0.5 ML Misc 1 each by Does not apply route 2 (two) times daily with a meal. Fill one syringe with 0.25 mL (25 units) of insulin twice daily with meals (breakfast and dinner).   levothyroxine 137 MCG tablet Commonly known as: SYNTHROID Take 137 mcg by mouth daily.  metFORMIN 500 MG tablet Commonly known as: GLUCOPHAGE Take 1 tablet (500 mg total) by mouth 2 (two) times daily with a meal.   metolazone 5 MG tablet Commonly known as: ZAROXOLYN Take 5 mg by mouth 2 (two) times a week.   NovoLIN 70/30 (70-30) 100 UNIT/ML injection Generic drug: insulin NPH-regular Human Inject 25 Units into the skin 2 (two) times daily with a meal.   ondansetron 8 MG tablet Commonly known as: Zofran Take 1 tablet (8 mg total) by mouth every 8 (eight) hours as needed for nausea or vomiting.   potassium chloride SA 20 MEQ tablet Commonly known as: KLOR-CON Take 20 mEq by mouth 2 (two) times daily.   ProAir RespiClick 542 (90 Base) MCG/ACT  Aepb Generic drug: Albuterol Sulfate Inhale 2 puffs into the lungs every 4 (four) hours as needed (shortness of breath / wheezing).   SALONPAS PAIN RELIEF PATCH EX Apply 1 patch topically daily as needed (pain).   trimethoprim-polymyxin b ophthalmic solution Commonly known as: POLYTRIM Place 2 drops into both eyes every 6 (six) hours for 5 days.      Follow-up Information    [facility] Follow up on 01/02/2020.   Specialty: Cardiology Why: at 8:30am. Enter through the [facility] entrance Contact information: [address] [place] [phone]       Derinda Late, MD Follow up on 12/29/2019.   Specialty: Family Medicine Why: Appointment @ 3:00 pm hospital follow up appointment Contact information: [address] [facility] [place] [address] [phone]          Allergies  Allergen Reactions  . Amlodipine Other (See Comments) and Itching  . Penicillins Other (See Comments)    Has patient had a PCN reaction causing immediate rash, facial/tongue/throat swelling, SOB or lightheadedness with hypotension: Yes Has patient had a PCN reaction causing severe rash involving mucus membranes or skin necrosis: No Has patient had a PCN reaction that required hospitalization: No Has patient had a PCN reaction occurring within the last 10 years: Yes If all of the above answers are "NO", then may proceed with Cephalosporin use.  Marland Kitchen Hydrochlorothiazide Rash    Consultations:      Procedures/Studies: CT ABDOMEN PELVIS WO CONTRAST  Result Date: 12/23/2019 CLINICAL DATA:  59 year old female with abdominal distension. EXAM: CT ABDOMEN AND PELVIS WITHOUT CONTRAST TECHNIQUE: Multidetector CT imaging of the abdomen and pelvis was performed following the standard protocol without IV contrast. COMPARISON:  CT abdomen pelvis dated 05/10/2019. FINDINGS:  Evaluation of this exam is limited in the absence of intravenous contrast. Lower chest: The visualized lung bases are clear. Top-normal cardiac size. Partially visualized AICD device. No intra-abdominal free air or free fluid. Hepatobiliary: The liver is grossly unremarkable. Subcentimeter right hepatic hypodense focus is too small to characterize. No intrahepatic biliary ductal dilatation. Cholecystectomy. Pancreas: Unremarkable. No pancreatic ductal dilatation or surrounding inflammatory changes. Spleen: Normal in size without focal abnormality. Adrenals/Urinary Tract: The adrenal glands are unremarkable. There is no hydronephrosis or nephrolithiasis on either side. The visualized ureters and urinary bladder appear unremarkable. Tiny pocket of air within the bladder may be related to recent instrumentation. Clinical correlation is recommended. Stomach/Bowel: There is moderate stool throughout the colon. There is no bowel obstruction or active inflammation. The appendix is normal. Vascular/Lymphatic: Mild aortoiliac atherosclerotic disease. The IVC is grossly unremarkable. No portal venous gas. There is no adenopathy. Reproductive: Hysterectomy. No adnexal  masses. Other: None Musculoskeletal: Degenerative changes of the spine. No acute osseous pathology. IMPRESSION: No acute intra-abdominal or pelvic pathology. No bowel obstruction. Normal appendix. Electronically Signed   By: Anner Crete M.D.   On: 12/23/2019 19:46   DG Chest 2 View  Result Date: 12/23/2019 CLINICAL DATA:  Shortness of breath EXAM: CHEST - 2 VIEW COMPARISON:  06/07/2019 FINDINGS: Defibrillator is again noted and stable. Cardiac shadow is mildly enlarged but stable. The lungs are well aerated bilaterally. Minimal right basilar atelectasis is seen. No sizable effusion or infiltrate is noted. No bony abnormality is seen. IMPRESSION: Mild right basilar atelectasis. Electronically Signed   By: Inez Catalina M.D.   On: 12/23/2019 17:01        Subjective: Patient reports feeling well.  No N/V, F/C or other complaints.  States she is comfortable with using insulin at home.  No acute events.   Discharge Exam: Vitals:   12/27/19 2357 12/28/19 0844  BP: (!) 114/54 116/64  Pulse: 81 75  Resp: 16 18  Temp: 98 F (36.7 C) 97.9 F (36.6 C)  SpO2: 97% 94%   Vitals:   12/27/19 1006 12/27/19 2116 12/27/19 2357 12/28/19 0844  BP: 109/69 113/71 (!) 114/54 116/64  Pulse: 75 76 81 75  Resp: 20  16 18   Temp: 98.5 F (36.9 C)  98 F (36.7 C) 97.9 F (36.6 C)  TempSrc: Oral  Oral Oral  SpO2: 96%  97% 94%  Weight:      Height:        General: Pt is alert, awake, not in acute distress Cardiovascular: RRR, S1/S2 +, no rubs, no gallops Respiratory: CTA bilaterally, no wheezing, no rhonchi Abdominal: Soft, NT, ND, bowel sounds + Extremities: no edema, no cyanosis    The results of significant diagnostics from this hospitalization (including imaging, microbiology, ancillary and laboratory) are listed below for reference.     Microbiology: Recent Results (from the past 240 hour(s))  Respiratory Panel by RT PCR (Flu A&B, Covid) - Nasopharyngeal Swab     Status: None   Collection Time: 12/23/19  7:56 PM   Specimen: Nasopharyngeal Swab  Result Value Ref Range Status   SARS Coronavirus 2 by RT PCR NEGATIVE NEGATIVE Final    Comment: (NOTE) SARS-CoV-2 target nucleic acids are NOT DETECTED. The SARS-CoV-2 RNA is generally detectable in upper respiratoy specimens during the acute phase of infection. The lowest concentration of SARS-CoV-2 viral copies this assay can detect is 131 copies/mL. A negative result does not preclude SARS-Cov-2 infection and should not be used as the sole basis for treatment or other patient management decisions. A negative result may occur with  improper specimen collection/handling, submission of specimen other than nasopharyngeal swab, presence of viral mutation(s) within the areas targeted  by this assay, and inadequate number of viral copies (<131 copies/mL). A negative result must be combined with clinical observations, patient history, and epidemiological information. The expected result is Negative. Fact Sheet for Patients:  PinkCheek.be Fact Sheet for Healthcare Providers:  GravelBags.it This test is not yet ap proved or cleared by the [place] FDA and  has been authorized for detection and/or diagnosis of SARS-CoV-2 by FDA under an Emergency Use Authorization (EUA). This EUA will remain  in effect (meaning this test can be used) for the duration of the COVID-19 declaration under Section 564(b)(1) of the Act, 21 U.S.C. section 360bbb-3(b)(1), unless the authorization is terminated or revoked sooner.    Influenza A by PCR NEGATIVE NEGATIVE Final  Influenza B by PCR NEGATIVE NEGATIVE Final    Comment: (NOTE) The Xpert Xpress SARS-CoV-2/FLU/RSV assay is intended as an aid in  the diagnosis of influenza from Nasopharyngeal swab specimens and  should not be used as a sole basis for treatment. Nasal washings and  aspirates are unacceptable for Xpert Xpress SARS-CoV-2/FLU/RSV  testing. Fact Sheet for Patients: PinkCheek.be Fact Sheet for Healthcare Providers: GravelBags.it This test is not yet approved or cleared by the [place] FDA and  has been authorized for detection and/or diagnosis of SARS-CoV-2 by  FDA under an Emergency Use Authorization (EUA). This EUA will remain  in effect (meaning this test can be used) for the duration of the  Covid-19 declaration under Section 564(b)(1) of the Act, 21  U.S.C. section 360bbb-3(b)(1), unless the authorization is  terminated or revoked. Performed at [facility], [address] [place], [place] [address]   Group A Strep by PCR     Status: None   Collection Time: 12/26/19 12:15 PM    Specimen: Throat; Sterile Swab  Result Value Ref Range Status   Group A Strep by PCR NOT DETECTED NOT DETECTED Final    Comment: Performed at [facility], [address] [place], [place] [address]     Labs: BNP (last 3 results) Recent Labs    01/20/19 2009 05/10/19 0743 12/23/19 1635  BNP 207.0* 80.0 803.2*   Basic Metabolic Panel: Recent Labs  Lab 12/24/19 0352 12/25/19 0449 12/26/19 0400 12/27/19 0633 12/28/19 0522  NA 132* 135 137 135 138  K 3.7 3.8 3.5 3.8 3.9  CL 92* 99 100 98 103  CO2 29 29 28 28 31   GLUCOSE 406* 364* 251* 273* 125*  BUN 38* 35* 31* 25* 23*  CREATININE 1.63* 1.46* 1.10* 1.04* 1.00  CALCIUM 9.1 8.5* 8.7* 8.7* 8.7*  MG  --  2.4 2.2  --   --    Liver Function Tests: Recent Labs  Lab 12/23/19 1800  AST 15  ALT 13  ALKPHOS 114  BILITOT 0.7  PROT 7.7  ALBUMIN 4.0   Recent Labs  Lab 12/23/19 1800  LIPASE 89*   No results for input(s): AMMONIA in the last 168 hours. CBC: Recent Labs  Lab 12/23/19 1635 12/24/19 0536 12/25/19 0449  WBC 7.4 13.6* 7.8  HGB 14.1 13.8 12.9  HCT 45.2 41.6 39.4  MCV 89.5 82.7 84.2  PLT 134* 138* 128*   Cardiac Enzymes: No results for input(s): CKTOTAL, CKMB, CKMBINDEX, TROPONINI in the last 168 hours. BNP: Invalid input(s): POCBNP CBG: Recent Labs  Lab 12/27/19 1429 12/27/19 1725 12/27/19 2148 12/28/19 0846 12/28/19 1154  GLUCAP 197* 206* 183* 197* 161*   D-Dimer No results for input(s): DDIMER in the last 72 hours. Hgb A1c No results for input(s): HGBA1C in the last 72 hours. Lipid Profile No results for input(s): CHOL, HDL, LDLCALC, TRIG, CHOLHDL, LDLDIRECT in the last 72 hours. Thyroid function studies No results for input(s): TSH, T4TOTAL, T3FREE, THYROIDAB in the last 72 hours.  Invalid input(s): FREET3 Anemia work up No results for input(s): VITAMINB12, FOLATE, FERRITIN, TIBC, IRON, RETICCTPCT in the last 72 hours. Urinalysis    Component Value Date/Time   COLORURINE  YELLOW (A) 09/30/2018 1848   APPEARANCEUR CLOUDY (A) 09/30/2018 1848   APPEARANCEUR Cloudy 09/01/2014 1009   LABSPEC 1.008 09/30/2018 1848   LABSPEC 1.027 09/01/2014 1009   PHURINE 7.0 09/30/2018 1848   GLUCOSEU 150 (A) 09/30/2018 1848   GLUCOSEU Negative 09/01/2014 1009   HGBUR  SMALL (A) 09/30/2018 1848   BILIRUBINUR NEGATIVE 09/30/2018 1848   BILIRUBINUR Negative 09/01/2014 1009   [place] 09/30/2018 1848   PROTEINUR 100 (A) 09/30/2018 1848   NITRITE NEGATIVE 09/30/2018 1848   LEUKOCYTESUR NEGATIVE 09/30/2018 1848   LEUKOCYTESUR Negative 09/01/2014 1009   Sepsis Labs Invalid input(s): PROCALCITONIN,  WBC,  LACTICIDVEN Microbiology Recent Results (from the past 240 hour(s))  Respiratory Panel by RT PCR (Flu A&B, Covid) - Nasopharyngeal Swab     Status: None   Collection Time: 12/23/19  7:56 PM   Specimen: Nasopharyngeal Swab  Result Value Ref Range Status   SARS Coronavirus 2 by RT PCR NEGATIVE NEGATIVE Final    Comment: (NOTE) SARS-CoV-2 target nucleic acids are NOT DETECTED. The SARS-CoV-2 RNA is generally detectable in upper respiratoy specimens during the acute phase of infection. The lowest concentration of SARS-CoV-2 viral copies this assay can detect is 131 copies/mL. A negative result does not preclude SARS-Cov-2 infection and should not be used as the sole basis for treatment or other patient management decisions. A negative result may occur with  improper specimen collection/handling, submission of specimen other than nasopharyngeal swab, presence of viral mutation(s) within the areas targeted by this assay, and inadequate number of viral copies (<131 copies/mL). A negative result must be combined with clinical observations, patient history, and epidemiological information. The expected result is Negative. Fact Sheet for Patients:  PinkCheek.be Fact Sheet for Healthcare Providers:   GravelBags.it This test is not yet ap proved or cleared by the [place] FDA and  has been authorized for detection and/or diagnosis of SARS-CoV-2 by FDA under an Emergency Use Authorization (EUA). This EUA will remain  in effect (meaning this test can be used) for the duration of the COVID-19 declaration under Section 564(b)(1) of the Act, 21 U.S.C. section 360bbb-3(b)(1), unless the authorization is terminated or revoked sooner.    Influenza A by PCR NEGATIVE NEGATIVE Final   Influenza B by PCR NEGATIVE NEGATIVE Final    Comment: (NOTE) The Xpert Xpress SARS-CoV-2/FLU/RSV assay is intended as an aid in  the diagnosis of influenza from Nasopharyngeal swab specimens and  should not be used as a sole basis for treatment. Nasal washings and  aspirates are unacceptable for Xpert Xpress SARS-CoV-2/FLU/RSV  testing. Fact Sheet for Patients: PinkCheek.be Fact Sheet for Healthcare Providers: GravelBags.it This test is not yet approved or cleared by the [place] FDA and  has been authorized for detection and/or diagnosis of SARS-CoV-2 by  FDA under an Emergency Use Authorization (EUA). This EUA will remain  in effect (meaning this test can be used) for the duration of the  Covid-19 declaration under Section 564(b)(1) of the Act, 21  U.S.C. section 360bbb-3(b)(1), unless the authorization is  terminated or revoked. Performed at [facility], [address] [place], [place] [address]   Group A Strep by PCR     Status: None   Collection Time: 12/26/19 12:15 PM   Specimen: Throat; Sterile Swab  Result Value Ref Range Status   Group A Strep by PCR NOT DETECTED NOT DETECTED Final    Comment: Performed at [facility], [address] [place], [place] [address]     Time coordinating discharge: Over 30 minutes  SIGNED:   Ezekiel Slocumb, DO [facility] 12/28/2019,  3:12 PM   If 7PM-7AM, please contact night-coverage [email or web address]

## 2020-01-01 NOTE — Progress Notes (Signed)
Patient ID: Heather Choi, female    DOB: 23-Sep-1961, 59 y.o.   MRN: [id number]  HPI  Heather Choi is a 59 y/o female with a history of asthma, DM, HTN, thyroid disease, bell's palsy, COPD, current tobacco use and chronic heart failure.   Echo report from 01/22/2019 reviewed and showed an EF of 25-30% along with moderate AR.   Admitted 12/23/19 due to new onset diabetes. Initially needed insulin drip & then transitioned to subcutaneous insulin. Given IVF due to AKI. Discharged after 5 days.   She presents today for her initial visit with a chief complaint of moderate shortness of breath upon moderate exertion. She describes this as chronic in nature having been present for several months but has improved since her recent admission. She was able to walk to our office from the [place] entrance but was quite short of breath upon arrival although quickly recovered once she sat down for a few minutes. She has associated fatigue, chest heaviness, abdominal distention, dizziness and difficulty sleeping along with this. She denies any palpitations, pedal edema, chest pain, cough or weight gain.   Past Medical History:  Diagnosis Date  . Asthma   . Bell's palsy    fingers tingling in cold weather  . CHF (congestive heart failure) ([place])   . COPD (chronic obstructive pulmonary disease) ([place])   . Enlarged heart   . Hypertension   . Thyroid disease    Past Surgical History:  Procedure Laterality Date  . ABDOMINAL HYSTERECTOMY    . CHOLECYSTECTOMY    . IMPLANTABLE CARDIOVERTER DEFIBRILLATOR IMPLANT    . laparoscopic bilateral oophorectomy with removal of adnexal mass and lysis of adhesions  11/20/2017  . LAPAROSCOPY     with removal of adnexal structure   Family History  Problem Relation Age of Onset  . Heart failure Father   . Heart attack Mother    Social History   Tobacco Use  . Smoking status: Current Every Day Smoker    Packs/day: 0.50    Types: Cigarettes  . Smokeless tobacco: Never  Used  Substance Use Topics  . Alcohol use: Yes    Comment: rarely   Allergies  Allergen Reactions  . Amlodipine Other (See Comments) and Itching  . Penicillins Other (See Comments)    Has patient had a PCN reaction causing immediate rash, facial/tongue/throat swelling, SOB or lightheadedness with hypotension: Yes Has patient had a PCN reaction causing severe rash involving mucus membranes or skin necrosis: No Has patient had a PCN reaction that required hospitalization: No Has patient had a PCN reaction occurring within the last 10 years: Yes If all of the above answers are "NO", then may proceed with Cephalosporin use.  Marland Kitchen Hydrochlorothiazide Rash   Prior to Admission medications   Medication Sig Start Date End Date Taking? Authorizing Provider  Albuterol Sulfate (PROAIR RESPICLICK) 742 (90 Base) MCG/ACT AEPB Inhale 2 puffs into the lungs every 4 (four) hours as needed (shortness of breath / wheezing).    Yes [provider]  ALPRAZolam Duanne Moron) 0.5 MG tablet Take 0.5 mg by mouth daily.    Yes [provider]  aspirin EC 81 MG tablet Take 81 mg by mouth daily.    Yes [provider]  blood glucose meter kit and supplies KIT Dispense based on patient and insurance preference. Use up to four times daily as directed. (FOR ICD-9 250.00, 250.01). 12/28/19  Yes Nicole Kindred A, DO  carvedilol (COREG) 25 MG tablet Take 25  mg by mouth 2 (two) times daily.  01/31/19  Yes [provider]  cetirizine (ZYRTEC) 10 MG tablet Take 10 mg by mouth daily.   Yes [provider]  digoxin (LANOXIN) 0.125 MG tablet Take 0.125 mg by mouth daily.   Yes [provider]  fluticasone (FLONASE) 50 MCG/ACT nasal spray Place 2 sprays into both nostrils daily.   Yes [provider]  furosemide (LASIX) 40 MG tablet Take 80 mg by mouth daily. 03/17/19  Yes [provider]  insulin aspart (NOVOLOG) 100 UNIT/ML FlexPen Inject 6 Units into the skin 3  (three) times daily with meals. 12/28/19  Yes Nicole Kindred A, DO  Insulin Syringe-Needle U-100 (INSULIN SYRINGE .5CC/28G) 28G X 1/2" 0.5 ML MISC 1 each by Does not apply route 2 (two) times daily with a meal. Fill one syringe with 0.25 mL (25 units) of insulin twice daily with meals (breakfast and dinner). 12/28/19  Yes Ezekiel Slocumb, DO  levothyroxine (SYNTHROID) 137 MCG tablet Take 137 mcg by mouth daily.  03/05/14  Yes [provider]  Liniments (SALONPAS PAIN RELIEF PATCH EX) Apply 1 patch topically daily as needed (pain).   Yes [provider]  metFORMIN (GLUCOPHAGE) 500 MG tablet Take 1 tablet (500 mg total) by mouth 2 (two) times daily with a meal. 12/28/19  Yes Nicole Kindred A, DO  metolazone (ZAROXOLYN) 5 MG tablet Take 5 mg by mouth 2 (two) times a week.  02/06/19 02/06/20 Yes [provider]  ondansetron (ZOFRAN) 8 MG tablet Take 1 tablet (8 mg total) by mouth every 8 (eight) hours as needed for nausea or vomiting. 12/28/19 12/27/20 Yes Nicole Kindred A, DO  potassium chloride SA (K-DUR,KLOR-CON) 20 MEQ tablet Take 20 mEq by mouth 2 (two) times daily.  04/29/15  Yes [provider]  trimethoprim-polymyxin b (POLYTRIM) ophthalmic solution Place 2 drops into both eyes every 6 (six) hours for 5 days. 12/28/19 01/02/20 Yes Ezekiel Slocumb, DO     Review of Systems  Constitutional: Positive for fatigue.  HENT: Negative for congestion, postnasal drip and sore throat.   Eyes: Negative.   Respiratory: Positive for chest tightness ("heavy") and shortness of breath (with moderate exertion). Negative for cough.   Cardiovascular: Negative for chest pain, palpitations and leg swelling.  Gastrointestinal: Positive for abdominal distention. Negative for abdominal pain.  Endocrine: Negative.   Musculoskeletal: Positive for back pain. Negative for neck pain.  Skin: Negative.   Allergic/Immunologic: Negative.   Neurological: Positive for dizziness, light-headedness  and numbness (fingertips with neuropathy).  Hematological: Negative for adenopathy. Does not bruise/bleed easily.  Psychiatric/Behavioral: Positive for sleep disturbance (sleeping on 3 pillows). Negative for dysphoric mood. The patient is not nervous/anxious.    Vitals:   01/02/20 0842  BP: 124/85  Pulse: 84  Resp: 18  SpO2: 99%  Weight: 168 lb 4 oz (76.3 kg)  Height: 5' 2"  (1.575 m)   Wt Readings from Last 3 Encounters:  01/02/20 168 lb 4 oz (76.3 kg)  12/24/19 150 lb (68 kg)  06/07/19 163 lb (73.9 kg)   Lab Results  Component Value Date   CREATININE 1.00 12/28/2019   CREATININE 1.04 (H) 12/27/2019   CREATININE 1.10 (H) 12/26/2019     Physical Exam Vitals and nursing note reviewed.  Constitutional:      Appearance: She is well-developed.  HENT:     Head: Normocephalic and atraumatic.  Neck:     Vascular: No JVD.  Cardiovascular:     Rate  and Rhythm: Normal rate and regular rhythm.  Pulmonary:     Effort: Pulmonary effort is normal. No respiratory distress.     Breath sounds: No wheezing or rales.  Abdominal:     General: There is distension.     Tenderness: There is no abdominal tenderness.  Musculoskeletal:     Cervical back: Normal range of motion and neck supple.     Right lower leg: No tenderness. No edema.     Left lower leg: No tenderness. No edema.  Skin:    General: Skin is warm and dry.  Neurological:     General: No focal deficit present.     Mental Status: She is alert and oriented to person, place, and time.  Psychiatric:        Mood and Affect: Mood normal.        Behavior: Behavior normal.    Assessment & Plan:  1: Chronic heart failure with reduced ejection fraction- - NYHA class II - euvolemic today - weighing daily and she was reminded to call for an overnight weight gain of >2 pounds or a weekly weight gain of >5 pounds - not adding salt to her food and has been trying to read food labels for sodium content; reviewed the importance of  closely following a 2077m sodium diet and written dietary information and low sodium cookbook were given to her - will stop her furosemide and begin torsemide 528mdaily (she's currently taking 8050murosemide daily). She will break the 100m78mrsemide in half and take 1/2 tablet daily - will check BMP next week - consider adding entresto at future visits - saw cardiology (Paraschos) 11/11/19 - BNP 12/23/19 was 110.0  2: HTN- - BP looks good today - saw PCP (BabBaldemar Lenis29/21 - BMP 12/28/19 reviewed and showed sodium 138, potassium 3.9, creatinine 1.00 and GFR >60  3: DM- - A1c 12/23/19 was 10.8% - fasting glucose at home this morning was 136  4: Tobacco use- - smoking 5-6 cigarettes daily - complete cessation discussed for 3 minutes with her   Patient did not bring her medications nor a list. Each medication was verbally reviewed with the patient and she was encouraged to bring the bottles to every visit to confirm accuracy of list.  Return in 4 days or sooner for any questions/problems before then.

## 2020-01-02 ENCOUNTER — Ambulatory Visit: Payer: BC Managed Care – PPO | Attending: Family | Admitting: Family

## 2020-01-02 ENCOUNTER — Other Ambulatory Visit: Payer: Self-pay

## 2020-01-02 ENCOUNTER — Telehealth: Payer: Self-pay | Admitting: Family

## 2020-01-02 ENCOUNTER — Encounter: Payer: Self-pay | Admitting: Family

## 2020-01-02 VITALS — BP 124/85 | HR 84 | Resp 18 | Ht 62.0 in | Wt 168.2 lb

## 2020-01-02 DIAGNOSIS — Z79899 Other long term (current) drug therapy: Secondary | ICD-10-CM | POA: Diagnosis not present

## 2020-01-02 DIAGNOSIS — R0602 Shortness of breath: Secondary | ICD-10-CM | POA: Insufficient documentation

## 2020-01-02 DIAGNOSIS — I110 Hypertensive heart disease with heart failure: Secondary | ICD-10-CM | POA: Diagnosis not present

## 2020-01-02 DIAGNOSIS — J449 Chronic obstructive pulmonary disease, unspecified: Secondary | ICD-10-CM | POA: Insufficient documentation

## 2020-01-02 DIAGNOSIS — E079 Disorder of thyroid, unspecified: Secondary | ICD-10-CM | POA: Insufficient documentation

## 2020-01-02 DIAGNOSIS — I5022 Chronic systolic (congestive) heart failure: Secondary | ICD-10-CM | POA: Insufficient documentation

## 2020-01-02 DIAGNOSIS — R5383 Other fatigue: Secondary | ICD-10-CM | POA: Insufficient documentation

## 2020-01-02 DIAGNOSIS — E119 Type 2 diabetes mellitus without complications: Secondary | ICD-10-CM | POA: Diagnosis not present

## 2020-01-02 DIAGNOSIS — F17210 Nicotine dependence, cigarettes, uncomplicated: Secondary | ICD-10-CM | POA: Insufficient documentation

## 2020-01-02 DIAGNOSIS — Z794 Long term (current) use of insulin: Secondary | ICD-10-CM | POA: Diagnosis not present

## 2020-01-02 DIAGNOSIS — R140 Abdominal distension (gaseous): Secondary | ICD-10-CM | POA: Insufficient documentation

## 2020-01-02 DIAGNOSIS — Z7982 Long term (current) use of aspirin: Secondary | ICD-10-CM | POA: Insufficient documentation

## 2020-01-02 DIAGNOSIS — R42 Dizziness and giddiness: Secondary | ICD-10-CM | POA: Diagnosis not present

## 2020-01-02 DIAGNOSIS — Z8249 Family history of ischemic heart disease and other diseases of the circulatory system: Secondary | ICD-10-CM | POA: Insufficient documentation

## 2020-01-02 DIAGNOSIS — Z720 Tobacco use: Secondary | ICD-10-CM

## 2020-01-02 DIAGNOSIS — I10 Essential (primary) hypertension: Secondary | ICD-10-CM

## 2020-01-02 MED ORDER — TORSEMIDE 100 MG PO TABS: 50.0000 mg | ORAL_TABLET | Freq: Every day | ORAL | 4 refills | Status: DC

## 2020-01-02 NOTE — Patient Instructions (Addendum)
Continue weighing daily and call for an overnight weight gain of > 2 pounds or a weekly weight gain of >5 pounds.   Stop taking furosemide and begin torsemide as 1/2 tablet once daily.

## 2020-01-02 NOTE — Telephone Encounter (Signed)
Did a survey with patient for her new [facility] Clinic appointment this morning to follow up since she got out of hospital. She is doing ok just needs a better understanding about heart failure. She is checking her weights daily, following a low sodium diet, and taking medications without issues aside from a pain patch she was perscribed while inpatient that never made it to pharmacy. She is having some swelling in her abdomen but otherwise doing ok.    Heather Choi, [place]

## 2020-01-05 NOTE — Progress Notes (Deleted)
Patient ID: Heather Choi, female    DOB: [birth year], 59 y.o.   MRN: [id number]  HPI  Ms Pellegrino is a 59 y/o female with a history of asthma, DM, HTN, thyroid disease, bell's palsy, COPD, current tobacco use and chronic heart failure.   Echo report from 01/22/2019 reviewed and showed an EF of 25-30% along with moderate AR.   Admitted 12/23/19 due to new onset diabetes. Initially needed insulin drip & then transitioned to subcutaneous insulin. Given IVF due to AKI. Discharged after 5 days.   She presents today for a follow-up visit with a chief complaint of    Past Medical History:  Diagnosis Date  . Asthma   . Bell's palsy    fingers tingling in cold weather  . CHF (congestive heart failure) ([facility])   . COPD (chronic obstructive pulmonary disease) ([facility])   . Diabetes mellitus without complication ([facility])   . Enlarged heart   . Hypertension   . Thyroid disease    Past Surgical History:  Procedure Laterality Date  . ABDOMINAL HYSTERECTOMY    . CHOLECYSTECTOMY    . IMPLANTABLE CARDIOVERTER DEFIBRILLATOR IMPLANT    . laparoscopic bilateral oophorectomy with removal of adnexal mass and lysis of adhesions  11/20/2017  . LAPAROSCOPY     with removal of adnexal structure   Family History  Problem Relation Age of Onset  . Heart failure Father   . Heart attack Mother    Social History   Tobacco Use  . Smoking status: Current Every Day Smoker    Packs/day: 0.50    Types: Cigarettes  . Smokeless tobacco: Never Used  Substance Use Topics  . Alcohol use: Yes    Comment: rarely   Allergies  Allergen Reactions  . Amlodipine Other (See Comments) and Itching  . Penicillins Other (See Comments)    Has patient had a PCN reaction causing immediate rash, facial/tongue/throat swelling, SOB or lightheadedness with hypotension: Yes Has patient had a PCN reaction causing severe rash involving mucus membranes or skin necrosis: No Has patient had a PCN reaction that required hospitalization:  No Has patient had a PCN reaction occurring within the last 10 years: Yes If all of the above answers are "NO", then may proceed with Cephalosporin use.  Marland Kitchen Hydrochlorothiazide Rash      Review of Systems  Constitutional: Positive for fatigue.  HENT: Negative for congestion, postnasal drip and sore throat.   Eyes: Negative.   Respiratory: Positive for chest tightness ("heavy") and shortness of breath (with moderate exertion). Negative for cough.   Cardiovascular: Negative for chest pain, palpitations and leg swelling.  Gastrointestinal: Positive for abdominal distention. Negative for abdominal pain.  Endocrine: Negative.   Musculoskeletal: Positive for back pain. Negative for neck pain.  Skin: Negative.   Allergic/Immunologic: Negative.   Neurological: Positive for dizziness, light-headedness and numbness (fingertips with neuropathy).  Hematological: Negative for adenopathy. Does not bruise/bleed easily.  Psychiatric/Behavioral: Positive for sleep disturbance (sleeping on 3 pillows). Negative for dysphoric mood. The patient is not nervous/anxious.       Physical Exam Vitals and nursing note reviewed.  Constitutional:      Appearance: She is well-developed.  HENT:     Head: Normocephalic and atraumatic.  Neck:     Vascular: No JVD.  Cardiovascular:     Rate and Rhythm: Normal rate and regular rhythm.  Pulmonary:     Effort: Pulmonary effort is normal. No respiratory distress.     Breath sounds: No wheezing or rales.  Abdominal:     General: There is distension.     Tenderness: There is no abdominal tenderness.  Musculoskeletal:     Cervical back: Normal range of motion and neck supple.     Right lower leg: No tenderness. No edema.     Left lower leg: No tenderness. No edema.  Skin:    General: Skin is warm and dry.  Neurological:     General: No focal deficit present.     Mental Status: She is alert and oriented to person, place, and time.  Psychiatric:        Mood  and Affect: Mood normal.        Behavior: Behavior normal.    Assessment & Plan:  1: Chronic heart failure with reduced ejection fraction- - NYHA class II - euvolemic today - weighing daily and she was reminded to call for an overnight weight gain of >2 pounds or a weekly weight gain of >5 pounds - weight 168.4 from last visit here 4 days ago - not adding salt to her food and has been trying to read food labels for sodium content - torsemide 50mg  daily started at last visit - will check BMP today - consider adding entresto at future visits - saw cardiology (Paraschos) 11/11/19 - BNP 12/23/19 was 110.0  2: HTN- - BP  - saw PCP Baldemar Lenis) 12/29/19 - BMP 12/28/19 reviewed and showed sodium 138, potassium 3.9, creatinine 1.00 and GFR >60  3: DM- - A1c 12/23/19 was 10.8% - fasting glucose at home this morning was   4: Tobacco use- - smoking 5-6 cigarettes daily - complete cessation discussed for 3 minutes with her   Patient did not bring her medications nor a list. Each medication was verbally reviewed with the patient and she was encouraged to bring the bottles to every visit to confirm accuracy of list.

## 2020-01-06 ENCOUNTER — Ambulatory Visit: Payer: BC Managed Care – PPO | Attending: Family | Admitting: Family

## 2020-01-06 ENCOUNTER — Ambulatory Visit: Payer: BC Managed Care – PPO | Admitting: Family

## 2020-01-06 ENCOUNTER — Other Ambulatory Visit: Payer: Self-pay

## 2020-01-06 ENCOUNTER — Encounter: Payer: Self-pay | Admitting: Family

## 2020-01-06 VITALS — BP 129/81 | HR 85 | Resp 18 | Ht 62.0 in | Wt 167.2 lb

## 2020-01-06 DIAGNOSIS — Z7982 Long term (current) use of aspirin: Secondary | ICD-10-CM | POA: Insufficient documentation

## 2020-01-06 DIAGNOSIS — Z79899 Other long term (current) drug therapy: Secondary | ICD-10-CM | POA: Diagnosis not present

## 2020-01-06 DIAGNOSIS — J449 Chronic obstructive pulmonary disease, unspecified: Secondary | ICD-10-CM | POA: Insufficient documentation

## 2020-01-06 DIAGNOSIS — Z8249 Family history of ischemic heart disease and other diseases of the circulatory system: Secondary | ICD-10-CM | POA: Insufficient documentation

## 2020-01-06 DIAGNOSIS — Z794 Long term (current) use of insulin: Secondary | ICD-10-CM | POA: Insufficient documentation

## 2020-01-06 DIAGNOSIS — R531 Weakness: Secondary | ICD-10-CM | POA: Diagnosis not present

## 2020-01-06 DIAGNOSIS — F17210 Nicotine dependence, cigarettes, uncomplicated: Secondary | ICD-10-CM | POA: Insufficient documentation

## 2020-01-06 DIAGNOSIS — R0789 Other chest pain: Secondary | ICD-10-CM | POA: Insufficient documentation

## 2020-01-06 DIAGNOSIS — R140 Abdominal distension (gaseous): Secondary | ICD-10-CM | POA: Insufficient documentation

## 2020-01-06 DIAGNOSIS — E079 Disorder of thyroid, unspecified: Secondary | ICD-10-CM | POA: Diagnosis not present

## 2020-01-06 DIAGNOSIS — R42 Dizziness and giddiness: Secondary | ICD-10-CM | POA: Insufficient documentation

## 2020-01-06 DIAGNOSIS — I5022 Chronic systolic (congestive) heart failure: Secondary | ICD-10-CM

## 2020-01-06 DIAGNOSIS — Z7901 Long term (current) use of anticoagulants: Secondary | ICD-10-CM | POA: Insufficient documentation

## 2020-01-06 DIAGNOSIS — E119 Type 2 diabetes mellitus without complications: Secondary | ICD-10-CM | POA: Diagnosis not present

## 2020-01-06 DIAGNOSIS — I110 Hypertensive heart disease with heart failure: Secondary | ICD-10-CM | POA: Diagnosis not present

## 2020-01-06 DIAGNOSIS — Z720 Tobacco use: Secondary | ICD-10-CM

## 2020-01-06 DIAGNOSIS — I10 Essential (primary) hypertension: Secondary | ICD-10-CM

## 2020-01-06 LAB — BASIC METABOLIC PANEL
Anion gap: 11 (ref 5–15)
BUN: 23 mg/dL — ABNORMAL HIGH (ref 6–20)
CO2: 30 mmol/L (ref 22–32)
Calcium: 9.1 mg/dL (ref 8.9–10.3)
Chloride: 99 mmol/L (ref 98–111)
Creatinine, Ser: 1.24 mg/dL — ABNORMAL HIGH (ref 0.44–1.00)
GFR calc Af Amer: 56 mL/min — ABNORMAL LOW (ref >60)
GFR calc non Af Amer: 48 mL/min — ABNORMAL LOW (ref >60)
Glucose, Bld: 150 mg/dL — ABNORMAL HIGH (ref 70–99)
Potassium: 3.1 mmol/L — ABNORMAL LOW (ref 3.5–5.1)
Sodium: 140 mmol/L (ref 135–145)

## 2020-01-06 MED ORDER — SACUBITRIL-VALSARTAN 24-26 MG PO TABS: 1.0000 | ORAL_TABLET | Freq: Two times a day (BID) | ORAL | 3 refills | Status: DC

## 2020-01-06 NOTE — Patient Instructions (Signed)
Continue weighing daily and call for an overnight weight gain of > 2 pounds or a weekly weight gain of >5 pounds. 

## 2020-01-06 NOTE — Progress Notes (Signed)
Patient ID: AVIN UPPERMAN, female    DOB: 14-Dec-1960, 59 y.o.   MRN: [id number]  HPI  Ms Rincon is a 59 y/o female with a history of asthma, DM, HTN, thyroid disease, bell's palsy, COPD, current tobacco use and chronic heart failure.   Echo report from 01/22/2019 reviewed and showed an EF of 25-30% along with moderate AR.   Admitted 12/23/19 due to new onset diabetes. Initially needed insulin drip & then transitioned to subcutaneous insulin. Given IVF due to AKI. Discharged after 5 days.   She presents today for a follow-up visit with a chief complaint of minimal shortness of breath upon moderate exertion. She describes this as chronic in nature. She has associated chest tightness, abdominal distention, light-headedness, leg weakness and difficulty sleeping along with this. She denies any palpitations, pedal edema, chest pain, cough, fatigue or weight gain.   Feels a little better since diuretic changed to torsemide but still feels like she has "fluid in the belly".     Past Medical History:  Diagnosis Date  . Asthma   . Bell's palsy    fingers tingling in cold weather  . CHF (congestive heart failure) ([place])   . COPD (chronic obstructive pulmonary disease) ([place])   . Diabetes mellitus without complication ([place])   . Enlarged heart   . Hypertension   . Thyroid disease    Past Surgical History:  Procedure Laterality Date  . ABDOMINAL HYSTERECTOMY    . CHOLECYSTECTOMY    . IMPLANTABLE CARDIOVERTER DEFIBRILLATOR IMPLANT    . laparoscopic bilateral oophorectomy with removal of adnexal mass and lysis of adhesions  11/20/2017  . LAPAROSCOPY     with removal of adnexal structure   Family History  Problem Relation Age of Onset  . Heart failure Father   . Heart attack Mother    Social History   Tobacco Use  . Smoking status: Current Every Day Smoker    Packs/day: 0.50    Types: Cigarettes  . Smokeless tobacco: Never Used  Substance Use Topics  . Alcohol use: Yes    Comment: rarely    Allergies  Allergen Reactions  . Amlodipine Other (See Comments) and Itching  . Penicillins Other (See Comments)    Has patient had a PCN reaction causing immediate rash, facial/tongue/throat swelling, SOB or lightheadedness with hypotension: Yes Has patient had a PCN reaction causing severe rash involving mucus membranes or skin necrosis: No Has patient had a PCN reaction that required hospitalization: No Has patient had a PCN reaction occurring within the last 10 years: Yes If all of the above answers are "NO", then may proceed with Cephalosporin use.  Marland Kitchen Hydrochlorothiazide Rash   Prior to Admission medications   Medication Sig Start Date End Date Taking? Authorizing Provider  Albuterol Sulfate (PROAIR RESPICLICK) 248 (90 Base) MCG/ACT AEPB Inhale 2 puffs into the lungs every 4 (four) hours as needed (shortness of breath / wheezing).    Yes [provider]  ALPRAZolam Duanne Moron) 0.5 MG tablet Take 0.5 mg by mouth daily.    Yes [provider]  aspirin EC 81 MG tablet Take 81 mg by mouth daily.    Yes [provider]  blood glucose meter kit and supplies KIT Dispense based on patient and insurance preference. Use up to four times daily as directed. (FOR ICD-9 250.00, 250.01). 12/28/19  Yes Nicole Kindred A, DO  carvedilol (COREG) 25 MG tablet Take 25 mg by mouth 2 (two) times daily.  01/31/19  Yes [provider]  cetirizine (ZYRTEC) 10 MG tablet Take 10 mg by mouth daily.   Yes [provider]  digoxin (LANOXIN) 0.125 MG tablet Take 0.125 mg by mouth daily.   Yes [provider]  fluticasone (FLONASE) 50 MCG/ACT nasal spray Place 2 sprays into both nostrils daily.   Yes [provider]  insulin aspart (NOVOLOG) 100 UNIT/ML FlexPen Inject 6 Units into the skin 3 (three) times daily with meals. 12/28/19  Yes Nicole Kindred A, DO  Insulin Syringe-Needle U-100 (INSULIN SYRINGE .5CC/28G) 28G X 1/2" 0.5 ML MISC 1 each by Does not apply  route 2 (two) times daily with a meal. Fill one syringe with 0.25 mL (25 units) of insulin twice daily with meals (breakfast and dinner). 12/28/19  Yes Ezekiel Slocumb, DO  levothyroxine (SYNTHROID) 137 MCG tablet Take 137 mcg by mouth daily.  03/05/14  Yes [provider]  Liniments (SALONPAS PAIN RELIEF PATCH EX) Apply 1 patch topically daily as needed (pain).   Yes [provider]  metFORMIN (GLUCOPHAGE) 500 MG tablet Take 1 tablet (500 mg total) by mouth 2 (two) times daily with a meal. 12/28/19  Yes Nicole Kindred A, DO  ondansetron (ZOFRAN) 8 MG tablet Take 1 tablet (8 mg total) by mouth every 8 (eight) hours as needed for nausea or vomiting. 12/28/19 12/27/20 Yes Nicole Kindred A, DO  potassium chloride SA (K-DUR,KLOR-CON) 20 MEQ tablet Take 20 mEq by mouth 2 (two) times daily.  04/29/15  Yes [provider]  torsemide (DEMADEX) 100 MG tablet Take 0.5 tablets (50 mg total) by mouth daily. 01/02/20  Yes Alisa Graff, FNP      Review of Systems  Constitutional: Negative for appetite change and fatigue.  HENT: Negative for congestion, postnasal drip and sore throat.   Eyes: Negative.   Respiratory: Positive for chest tightness ("heavy") and shortness of breath. Negative for cough.   Cardiovascular: Negative for chest pain, palpitations and leg swelling.  Gastrointestinal: Positive for abdominal distention. Negative for abdominal pain.  Endocrine: Negative.   Genitourinary: Negative.   Musculoskeletal: Positive for back pain. Negative for neck pain.  Skin: Negative.   Allergic/Immunologic: Negative.   Neurological: Positive for dizziness, weakness (legs), light-headedness and numbness (fingertips with neuropathy).  Hematological: Negative for adenopathy. Does not bruise/bleed easily.  Psychiatric/Behavioral: Positive for sleep disturbance (sleeping on 3 pillows). Negative for dysphoric mood. The patient is not nervous/anxious.    Vitals:   01/06/20 1338  BP:  129/81  Pulse: 85  Resp: 18  SpO2: 97%  Weight: 167 lb 4 oz (75.9 kg)  Height: 5' 2"  (1.575 m)   Wt Readings from Last 3 Encounters:  01/06/20 167 lb 4 oz (75.9 kg)  01/02/20 168 lb 4 oz (76.3 kg)  12/24/19 150 lb (68 kg)   Lab Results  Component Value Date   CREATININE 1.00 12/28/2019   CREATININE 1.04 (H) 12/27/2019   CREATININE 1.10 (H) 12/26/2019     Physical Exam Vitals and nursing note reviewed.  Constitutional:      Appearance: She is well-developed.  HENT:     Head: Normocephalic and atraumatic.  Neck:     Vascular: No JVD.  Cardiovascular:     Rate and Rhythm: Normal rate and regular rhythm.  Pulmonary:     Effort: Pulmonary effort is normal. No respiratory distress.     Breath sounds: No wheezing or rales.  Abdominal:     General: There is distension.     Tenderness: There is  no abdominal tenderness.  Musculoskeletal:     Cervical back: Normal range of motion and neck supple.     Right lower leg: No tenderness. No edema.     Left lower leg: No tenderness. No edema.  Skin:    General: Skin is warm and dry.  Neurological:     General: No focal deficit present.     Mental Status: She is alert and oriented to person, place, and time.  Psychiatric:        Mood and Affect: Mood normal.        Behavior: Behavior normal.    Assessment & Plan:  1: Chronic heart failure with reduced ejection fraction- - NYHA class II - euvolemic today - weighing daily and she was reminded to call for an overnight weight gain of >2 pounds or a weekly weight gain of >5 pounds - weight down 1 pound from last visit here 4 days ago - not adding salt to her food and has been trying to read food labels for sodium content - torsemide 15m daily started at last visit - will check BMP today - will add entresto 24/273mBID; 1 bottle (28 tablets) sample given so she can start it today; 30 day voucher given to her as well - will check BMP at her next visit since starting entresto  today; patient to let usKoreanow if her copay is too expensive for her to afford - saw cardiology (Paraschos) 11/11/19 - BNP 12/23/19 was 110.0  2: HTN- - BP looks good today - saw PCP (BBaldemar Lenis3/29/21 - BMP 12/28/19 reviewed and showed sodium 138, potassium 3.9, creatinine 1.00 and GFR >60  3: DM- - A1c 12/23/19 was 10.8% - fasting glucose at home this morning was 226; having difficulty affording her insulin  4: Tobacco use- - smoking 5-6 cigarettes daily - complete cessation discussed for 3 minutes with her   Patient did not bring her medications nor a list. Each medication was verbally reviewed with the patient and she was encouraged to bring the bottles to every visit to confirm accuracy of list.   Return in 3 weeks or sooner for any questions/problems before then.

## 2020-01-07 ENCOUNTER — Telehealth: Payer: Self-pay | Admitting: Family

## 2020-01-07 NOTE — Telephone Encounter (Signed)
Called patient to discuss BMP results from yesterday. Potassium is on the low side and patient says that she just resumed taking her potassium a couple of weeks ago. She is currently taking potassium BID. Advised patient to take an additional potassium tablet today and tomorrow.   Started patient on entresto 24/26mg  yesterday and she says that it made her "feel funny". She said that it made her nose tingle and itch. Patient is agreeable to taking it today and seeing how she feels with it. Advised patient to update me tomorrow with how she feels with it but if these symptoms worsen, to stop taking it.   Patient verbalized understanding and was comfortable with this plan.

## 2020-01-27 NOTE — Progress Notes (Signed)
Patient ID: Heather Choi, female    DOB: 1961-07-07, 59 y.o.   MRN: [id number]  HPI  Heather Choi is a 59 y/o female with a history of asthma, DM, HTN, thyroid disease, bell's palsy, COPD, current tobacco use and chronic heart failure.   Echo report from 01/22/2019 reviewed and showed an EF of 25-30% along with moderate AR.   Admitted 12/23/19 due to new onset diabetes. Initially needed insulin drip & then transitioned to subcutaneous insulin. Given IVF due to AKI. Discharged after 5 days.   She presents today for a follow-up visit with a chief complaint of minimal shortness of breath upon moderate exertion. She describes this as chronic in nature having been present for several years. She has associated intermittent chest tightness, abdominal distention, dizziness, headaches, leg weakness and difficulty sleeping along with this. She denies any palpitations, pedal edema, chest pain, cough, fatigue or weight gain.   After initial nose itching/tingling with entresto, symptoms resolved.       Past Medical History:  Diagnosis Date  . Asthma   . Bell's palsy    fingers tingling in cold weather  . CHF (congestive heart failure) ([place])   . COPD (chronic obstructive pulmonary disease) ([place])   . Diabetes mellitus without complication ([place])   . Enlarged heart   . Hypertension   . Thyroid disease    Past Surgical History:  Procedure Laterality Date  . ABDOMINAL HYSTERECTOMY    . CHOLECYSTECTOMY    . IMPLANTABLE CARDIOVERTER DEFIBRILLATOR IMPLANT    . laparoscopic bilateral oophorectomy with removal of adnexal mass and lysis of adhesions  11/20/2017  . LAPAROSCOPY     with removal of adnexal structure   Family History  Problem Relation Age of Onset  . Heart failure Father   . Heart attack Mother    Social History   Tobacco Use  . Smoking status: Current Every Day Smoker    Packs/day: 0.50    Types: Cigarettes  . Smokeless tobacco: Never Used  Substance Use Topics  . Alcohol use: Yes    Comment: rarely   Allergies  Allergen Reactions  . Amlodipine Other (See Comments) and Itching  . Penicillins Other (See Comments)    Has patient had a PCN reaction causing immediate rash, facial/tongue/throat swelling, SOB or lightheadedness with hypotension: Yes Has patient had a PCN reaction causing severe rash involving mucus membranes or skin necrosis: No Has patient had a PCN reaction that required hospitalization: No Has patient had a PCN reaction occurring within the last 10 years: Yes If all of the above answers are "NO", then may proceed with Cephalosporin use.  Marland Kitchen Hydrochlorothiazide Rash    Prior to Admission medications   Medication Sig Start Date End Date Taking? Authorizing Provider  Albuterol Sulfate (PROAIR RESPICLICK) 628 (90 Base) MCG/ACT AEPB Inhale 2 puffs into the lungs every 4 (four) hours as needed (shortness of breath / wheezing).    Yes [provider]  ALPRAZolam Duanne Moron) 0.5 MG tablet Take 0.5 mg by mouth daily.    Yes [provider]  aspirin EC 81 MG tablet Take 81 mg by mouth daily.    Yes [provider]  blood glucose meter kit and supplies KIT Dispense based on patient and insurance preference. Use up to four times daily as directed. (FOR ICD-9 250.00, 250.01). 12/28/19  Yes Nicole Kindred A, DO  carvedilol (COREG) 25 MG tablet Take 25 mg by mouth 2 (two) times daily.  01/31/19  Yes [provider]  cetirizine (ZYRTEC) 10 MG tablet Take 10 mg by mouth daily.   Yes [provider]  digoxin (LANOXIN) 0.125 MG tablet Take 0.125 mg by mouth daily.   Yes [provider]  fluticasone (FLONASE) 50 MCG/ACT nasal spray Place 2 sprays into both nostrils daily.   Yes [provider]  Insulin Syringe-Needle U-100 (INSULIN SYRINGE .5CC/28G) 28G X 1/2" 0.5 ML MISC 1 each by Does not apply route 2 (two) times daily with a meal. Fill one syringe with 0.25 mL (25 units) of insulin twice daily with meals (breakfast  and dinner). 12/28/19  Yes Ezekiel Slocumb, DO  levothyroxine (SYNTHROID) 137 MCG tablet Take 137 mcg by mouth daily.  03/05/14  Yes [provider]  Liniments (SALONPAS PAIN RELIEF PATCH EX) Apply 1 patch topically daily as needed (pain).   Yes [provider]  metFORMIN (GLUCOPHAGE) 500 MG tablet Take 1 tablet (500 mg total) by mouth 2 (two) times daily with a meal. 12/28/19  Yes Nicole Kindred A, DO  metolazone (ZAROXOLYN) 5 MG tablet Take 5 mg by mouth 2 (two) times a week.  02/06/19 02/06/20 Yes [provider]  ondansetron (ZOFRAN) 8 MG tablet Take 1 tablet (8 mg total) by mouth every 8 (eight) hours as needed for nausea or vomiting. 12/28/19 12/27/20 Yes Nicole Kindred A, DO  potassium chloride SA (K-DUR,KLOR-CON) 20 MEQ tablet Take 20 mEq by mouth 2 (two) times daily.  04/29/15  Yes [provider]  sacubitril-valsartan (ENTRESTO) 24-26 MG Take 1 tablet by mouth 2 (two) times daily. 01/06/20  Yes Darylene Price A, FNP  torsemide (DEMADEX) 100 MG tablet Take 0.5 tablets (50 mg total) by mouth daily. 01/02/20  Yes Madalena Kesecker, Otila Kluver A, FNP  insulin aspart (NOVOLOG) 100 UNIT/ML FlexPen Inject 6 Units into the skin 3 (three) times daily with meals. Patient not taking: Reported on 01/28/2020 12/28/19   Ezekiel Slocumb, DO     Review of Systems  Constitutional: Negative for appetite change and fatigue.  HENT: Negative for congestion, postnasal drip and sore throat.   Eyes: Negative.   Respiratory: Positive for chest tightness ("heavy at times") and shortness of breath. Negative for cough.   Cardiovascular: Negative for chest pain, palpitations and leg swelling.  Gastrointestinal: Positive for abdominal distention. Negative for abdominal pain.  Endocrine: Negative.   Genitourinary: Negative.   Musculoskeletal: Positive for back pain. Negative for neck pain.  Skin: Negative.   Allergic/Immunologic: Negative.   Neurological: Positive for dizziness, weakness (legs),  light-headedness, numbness (fingertips with neuropathy) and headaches.  Hematological: Negative for adenopathy. Does not bruise/bleed easily.  Psychiatric/Behavioral: Positive for sleep disturbance (sleeping on 3 pillows). Negative for dysphoric mood. The patient is not nervous/anxious.    Vitals:   01/28/20 1333  BP: 124/77  Pulse: 76  Resp: 20  SpO2: 97%  Weight: 168 lb (76.2 kg)  Height: 5' 1"  (1.549 m)   Wt Readings from Last 3 Encounters:  01/28/20 168 lb (76.2 kg)  01/06/20 167 lb 4 oz (75.9 kg)  01/02/20 168 lb 4 oz (76.3 kg)   Lab Results  Component Value Date   CREATININE 1.24 (H) 01/06/2020   CREATININE 1.00 12/28/2019   CREATININE 1.04 (H) 12/27/2019     Physical Exam Vitals and nursing note reviewed.  Constitutional:      Appearance: She is well-developed.  HENT:     Head: Normocephalic and atraumatic.  Neck:     Vascular: No JVD.  Cardiovascular:  Rate and Rhythm: Normal rate and regular rhythm.  Pulmonary:     Effort: Pulmonary effort is normal. No respiratory distress.     Breath sounds: No wheezing or rales.  Abdominal:     General: There is no distension.     Tenderness: There is no abdominal tenderness.  Musculoskeletal:     Cervical back: Normal range of motion and neck supple.     Right lower leg: No tenderness. No edema.     Left lower leg: No tenderness. No edema.  Skin:    General: Skin is warm and dry.  Neurological:     General: No focal deficit present.     Mental Status: She is alert and oriented to person, place, and time.  Psychiatric:        Mood and Affect: Mood normal.        Behavior: Behavior normal.    Assessment & Plan:  1: Chronic heart failure with reduced ejection fraction- - NYHA class II - euvolemic today - weighing daily and she was reminded to call for an overnight weight gain of >2 pounds or a weekly weight gain of >5 pounds - weight up 1 pound from last visit here 1 month ago - not adding salt to her food  and has been trying to read food labels for sodium content - saw cardiology ([facility]) 11/11/19 - consider titrating entresto at future visits as would like to stop the metolazone if possible - will check BMP today - could add farxiga in the future as well  - BNP 12/23/19 was 110.0  2: HTN- - BP looks good today - saw PCP Baldemar Lenis) 01/20/20 - BMP 01/13/20 reviewed and showed sodium 143, potassium 3.9, creatinine 1.3 and GFR 51  3: DM- - A1c 12/23/19 was 10.8% - fasting glucose at home this morning was 103  4: Tobacco use- - smoking 5-6 cigarettes daily - complete cessation discussed for 3 minutes with her   Patient did not bring her medications nor a list. Each medication was verbally reviewed with the patient and she was encouraged to bring the bottles to every visit to confirm accuracy of list.

## 2020-01-28 ENCOUNTER — Ambulatory Visit: Payer: BC Managed Care – PPO | Attending: Family | Admitting: Family

## 2020-01-28 ENCOUNTER — Telehealth: Payer: Self-pay | Admitting: Family

## 2020-01-28 ENCOUNTER — Other Ambulatory Visit: Payer: Self-pay

## 2020-01-28 ENCOUNTER — Other Ambulatory Visit: Payer: Self-pay | Admitting: Family

## 2020-01-28 ENCOUNTER — Encounter: Payer: Self-pay | Admitting: Family

## 2020-01-28 VITALS — BP 124/77 | HR 76 | Resp 20 | Ht 61.0 in | Wt 168.0 lb

## 2020-01-28 DIAGNOSIS — Z79899 Other long term (current) drug therapy: Secondary | ICD-10-CM | POA: Insufficient documentation

## 2020-01-28 DIAGNOSIS — F17210 Nicotine dependence, cigarettes, uncomplicated: Secondary | ICD-10-CM | POA: Insufficient documentation

## 2020-01-28 DIAGNOSIS — Z7982 Long term (current) use of aspirin: Secondary | ICD-10-CM | POA: Diagnosis not present

## 2020-01-28 DIAGNOSIS — Z8249 Family history of ischemic heart disease and other diseases of the circulatory system: Secondary | ICD-10-CM | POA: Diagnosis not present

## 2020-01-28 DIAGNOSIS — I5022 Chronic systolic (congestive) heart failure: Secondary | ICD-10-CM | POA: Insufficient documentation

## 2020-01-28 DIAGNOSIS — Z794 Long term (current) use of insulin: Secondary | ICD-10-CM | POA: Insufficient documentation

## 2020-01-28 DIAGNOSIS — Z888 Allergy status to other drugs, medicaments and biological substances status: Secondary | ICD-10-CM | POA: Insufficient documentation

## 2020-01-28 DIAGNOSIS — I10 Essential (primary) hypertension: Secondary | ICD-10-CM

## 2020-01-28 DIAGNOSIS — E079 Disorder of thyroid, unspecified: Secondary | ICD-10-CM | POA: Insufficient documentation

## 2020-01-28 DIAGNOSIS — Z720 Tobacco use: Secondary | ICD-10-CM

## 2020-01-28 DIAGNOSIS — I110 Hypertensive heart disease with heart failure: Secondary | ICD-10-CM | POA: Diagnosis not present

## 2020-01-28 DIAGNOSIS — J449 Chronic obstructive pulmonary disease, unspecified: Secondary | ICD-10-CM | POA: Diagnosis not present

## 2020-01-28 DIAGNOSIS — Z79890 Hormone replacement therapy (postmenopausal): Secondary | ICD-10-CM | POA: Diagnosis not present

## 2020-01-28 DIAGNOSIS — Z880 Allergy status to penicillin: Secondary | ICD-10-CM | POA: Diagnosis not present

## 2020-01-28 DIAGNOSIS — E119 Type 2 diabetes mellitus without complications: Secondary | ICD-10-CM | POA: Diagnosis not present

## 2020-01-28 LAB — BASIC METABOLIC PANEL
Anion gap: 11 (ref 5–15)
BUN: 19 mg/dL (ref 6–20)
CO2: 25 mmol/L (ref 22–32)
Calcium: 9.4 mg/dL (ref 8.9–10.3)
Chloride: 102 mmol/L (ref 98–111)
Creatinine, Ser: 1.38 mg/dL — ABNORMAL HIGH (ref 0.44–1.00)
GFR calc Af Amer: 49 mL/min — ABNORMAL LOW (ref >60)
GFR calc non Af Amer: 42 mL/min — ABNORMAL LOW (ref >60)
Glucose, Bld: 59 mg/dL — ABNORMAL LOW (ref 70–99)
Potassium: 4 mmol/L (ref 3.5–5.1)
Sodium: 138 mmol/L (ref 135–145)

## 2020-01-28 NOTE — Telephone Encounter (Signed)
LM on patient's voicemail per her request as she had to return to work after her appointment today. Advised patient that her sodium and potassium levels looked good but that her renal function had declined.   Instructed to decrease her twice weekly metolazone to once/ week. Would like to stop it altogether if possible. Instructed to weigh daily and should her weight increase >2 pounds overnight or >5 pounds in a week or swelling occurs, to let me know.   Advised that she could call back tomorrow on her work break if she needs clarification.

## 2020-01-28 NOTE — Telephone Encounter (Signed)
Patient returned my call regarding message left. She says that she no longer takes metolazone and hasn't been taking it for a few weeks. Will recheck a BMP at her next visit.   Emphasized that she needed to bring all her medication bottles with her or at least an accurate list. Metolazone has now been removed from her medication list.

## 2020-01-28 NOTE — Patient Instructions (Signed)
Continue weighing daily and call for an overnight weight gain of > 2 pounds or a weekly weight gain of >5 pounds. 

## 2020-02-09 ENCOUNTER — Ambulatory Visit (INDEPENDENT_AMBULATORY_CARE_PROVIDER_SITE_OTHER): Payer: BC Managed Care – PPO | Admitting: Vascular Surgery

## 2020-02-09 ENCOUNTER — Other Ambulatory Visit: Payer: Self-pay

## 2020-02-09 ENCOUNTER — Encounter (INDEPENDENT_AMBULATORY_CARE_PROVIDER_SITE_OTHER): Payer: Self-pay | Admitting: Vascular Surgery

## 2020-02-09 VITALS — BP 125/76 | HR 68 | Ht 61.0 in | Wt 169.0 lb

## 2020-02-09 DIAGNOSIS — I10 Essential (primary) hypertension: Secondary | ICD-10-CM

## 2020-02-09 DIAGNOSIS — J449 Chronic obstructive pulmonary disease, unspecified: Secondary | ICD-10-CM

## 2020-02-09 DIAGNOSIS — M79604 Pain in right leg: Secondary | ICD-10-CM

## 2020-02-09 DIAGNOSIS — M79606 Pain in leg, unspecified: Secondary | ICD-10-CM | POA: Insufficient documentation

## 2020-02-09 DIAGNOSIS — I739 Peripheral vascular disease, unspecified: Secondary | ICD-10-CM

## 2020-02-09 DIAGNOSIS — M79605 Pain in left leg: Secondary | ICD-10-CM

## 2020-02-09 DIAGNOSIS — E119 Type 2 diabetes mellitus without complications: Secondary | ICD-10-CM

## 2020-02-09 NOTE — Progress Notes (Signed)
MRN : [id number]  Heather Choi is a 59 y.o. (1960/10/15) female who presents with chief complaint of  Chief Complaint  Patient presents with  . New Patient (Initial Visit)    LE pain  .  History of Present Illness:   The patient is seen for evaluation of painful lower extremities. Patient notes the pain is variable and not always associated with activity.  The pain is somewhat consistent day to day occurring on most days. The patient notes the pain also occurs with standing and routinely seems worse as the day wears on. The pain has been progressive over the past several years. The patient states these symptoms are causing  a profound negative impact on quality of life and daily activities.  The patient denies rest pain or dangling of an extremity off the side of the bed during the night for relief. No open wounds or sores at this time. No history of DVT or phlebitis. No prior interventions or surgeries.  There is a  history of back problems and DJD of the lumbar and sacral spine.    Current Meds  Medication Sig  . Albuterol Sulfate (PROAIR RESPICLICK) 709 (90 Base) MCG/ACT AEPB Inhale 2 puffs into the lungs every 4 (four) hours as needed (shortness of breath / wheezing).   . ALPRAZolam (XANAX) 0.5 MG tablet Take 0.5 mg by mouth daily.   Marland Kitchen aspirin EC 81 MG tablet Take 81 mg by mouth daily.   . blood glucose meter kit and supplies KIT Dispense based on patient and insurance preference. Use up to four times daily as directed. (FOR ICD-9 250.00, 250.01).  . carvedilol (COREG) 25 MG tablet Take 25 mg by mouth 2 (two) times daily.   . cetirizine (ZYRTEC) 10 MG tablet Take 10 mg by mouth daily.  . digoxin (LANOXIN) 0.125 MG tablet Take 0.125 mg by mouth daily.  . fluticasone (FLONASE) 50 MCG/ACT nasal spray Place 2 sprays into both nostrils daily.  . insulin aspart (NOVOLOG) 100 UNIT/ML FlexPen Inject 6 Units into the skin 3 (three) times daily with meals.  . Insulin Syringe-Needle  U-100 (INSULIN SYRINGE .5CC/28G) 28G X 1/2" 0.5 ML MISC 1 each by Does not apply route 2 (two) times daily with a meal. Fill one syringe with 0.25 mL (25 units) of insulin twice daily with meals (breakfast and dinner).  . metFORMIN (GLUCOPHAGE-XR) 500 MG 24 hr tablet 500 mg.  . ondansetron (ZOFRAN) 8 MG tablet Take 1 tablet (8 mg total) by mouth every 8 (eight) hours as needed for nausea or vomiting.  . potassium chloride SA (K-DUR,KLOR-CON) 20 MEQ tablet Take 20 mEq by mouth 2 (two) times daily.   . sacubitril-valsartan (ENTRESTO) 24-26 MG Take 1 tablet by mouth 2 (two) times daily.  Marland Kitchen torsemide (DEMADEX) 100 MG tablet Take 0.5 tablets (50 mg total) by mouth daily.    Past Medical History:  Diagnosis Date  . Asthma   . Bell's palsy    fingers tingling in cold weather  . CHF (congestive heart failure) ([place])   . COPD (chronic obstructive pulmonary disease) ([place])   . Diabetes mellitus without complication ([place])   . Enlarged heart   . Hypertension   . Thyroid disease     Past Surgical History:  Procedure Laterality Date  . ABDOMINAL HYSTERECTOMY    . CHOLECYSTECTOMY    . IMPLANTABLE CARDIOVERTER DEFIBRILLATOR IMPLANT    . laparoscopic bilateral oophorectomy with removal of adnexal mass and lysis of adhesions  11/20/2017  .  LAPAROSCOPY     with removal of adnexal structure    Social History Social History   Tobacco Use  . Smoking status: Current Every Day Smoker    Packs/day: 0.50    Types: Cigarettes  . Smokeless tobacco: Never Used  Substance Use Topics  . Alcohol use: Yes    Comment: rarely  . Drug use: No    Family History Family History  Problem Relation Age of Onset  . Heart failure Father   . Heart attack Mother   No family history of bleeding/clotting disorders, porphyria or autoimmune disease   Allergies  Allergen Reactions  . Clindamycin/Lincomycin     Burning sensastion  . Amlodipine Other (See Comments) and Itching  . Penicillins Other (See Comments)     Has patient had a PCN reaction causing immediate rash, facial/tongue/throat swelling, SOB or lightheadedness with hypotension: Yes Has patient had a PCN reaction causing severe rash involving mucus membranes or skin necrosis: No Has patient had a PCN reaction that required hospitalization: No Has patient had a PCN reaction occurring within the last 10 years: Yes If all of the above answers are "NO", then may proceed with Cephalosporin use.  Marland Kitchen Hydrochlorothiazide Rash     REVIEW OF SYSTEMS (Negative unless checked)  Constitutional: _0 Weight loss  _1 Fever  _2 Chills Cardiac: _3 Chest pain   _4 Chest pressure   _5 Palpitations   _6 Shortness of breath when laying flat   _7 Shortness of breath with exertion. Vascular:  _8 Pain in legs with walking   _9 Pain in legs at rest  _10 History of DVT   _11 Phlebitis   _12 Swelling in legs   _13 Varicose veins   _14 Non-healing ulcers Pulmonary:   _15 Uses home oxygen   _16 Productive cough   _17 Hemoptysis   _18 Wheeze  _19 COPD   _20 Asthma Neurologic:  _21 Dizziness   _22 Seizures   _23 History of stroke   _24 History of TIA  _25 Aphasia   _26 Vissual changes   _27 Weakness or numbness in arm   _28 Weakness or numbness in leg Musculoskeletal:   _29 Joint swelling   _30 Joint pain   _31 Low back pain Hematologic:  _32 Easy bruising  _33 Easy bleeding   _34 Hypercoagulable state   _35 Anemic Gastrointestinal:  _36 Diarrhea   _37 Vomiting  _38 Gastroesophageal reflux/heartburn   _39 Difficulty swallowing. Genitourinary:  _40 Chronic kidney disease   _41 Difficult urination  _42 Frequent urination   _43 Blood in urine Skin:  _44 Rashes   _45 Ulcers  Psychological:  _46 History of anxiety   _47  History of major depression.  Physical Examination  Vitals:   02/09/20 0844  BP: 125/76  Pulse: 68  Weight: 169 lb (76.7 kg)  Height: _48  (1.549 m)   Body mass index is 31.93 kg/m. Gen: WD/WN, NAD Head: [place]/AT, No temporalis wasting.  Ear/Nose/Throat: Hearing grossly intact, nares w/o erythema or drainage, poor dentition Eyes:  PER, EOMI, sclera nonicteric.  Neck: Supple, no masses.  No bruit or JVD.  Pulmonary:  Good air movement, clear to auscultation bilaterally, no use of accessory muscles.  Cardiac: RRR, normal S1, S2, no Murmurs. Vascular:  Vessel Right Left  Radial Palpable Palpable  PT 2+ Palpable 2+ Palpable  DP 1+ Palpable 1+ Palpable  Gastrointestinal: soft, non-distended. No guarding/no peritoneal signs.  Musculoskeletal: M/S 5/5 throughout.  No deformity or atrophy.  Neurologic: CN 2-12 intact. Pain and light touch intact in extremities.  Symmetrical.  Speech is fluent. Motor exam as listed above. Psychiatric: Judgment intact, Mood & affect appropriate for pt's clinical situation. Dermatologic: No rashes or ulcers noted.  No changes consistent with cellulitis.   CBC  Lab Results  Component Value Date   WBC 7.8 12/25/2019   HGB 12.9 12/25/2019   HCT 39.4 12/25/2019   MCV 84.2 12/25/2019   PLT 128 (L) 12/25/2019    BMET    Component Value Date/Time   NA 138 01/28/2020 1428   NA 143 09/01/2014 1009   K 4.0 01/28/2020 1428   K 4.0 09/01/2014 1009   CL 102 01/28/2020 1428   CL 110 (H) 09/01/2014 1009   CO2 25 01/28/2020 1428   CO2 28 09/01/2014 1009   GLUCOSE 59 (L) 01/28/2020 1428   GLUCOSE 95 09/01/2014 1009   BUN 19 01/28/2020 1428   BUN 14 09/01/2014 1009   CREATININE 1.38 (H) 01/28/2020 1428   CREATININE 0.93 09/01/2014 1009   CALCIUM 9.4 01/28/2020 1428   CALCIUM 8.2 (L) 09/01/2014 1009   GFRNONAA 42 (L) 01/28/2020 1428   GFRNONAA >60 09/01/2014 1009   GFRNONAA >60 10/05/2013 2125   GFRAA 49 (L) 01/28/2020 1428   GFRAA >60 09/01/2014 1009   GFRAA >60 10/05/2013 2125   Estimated Creatinine Clearance: 41.7 mL/min (A) (by C-G formula based on SCr of 1.38 mg/dL (H)).  COAG Lab Results  Component Value Date   INR 1.01 09/30/2018   INR 1.0 09/01/2014    Radiology No results found.   Assessment/Plan 1. Pain in both lower extremities  Recommend:  The patient has  atypical pain symptoms for pure atherosclerotic disease.   Noninvasive studies including ABI's and Aorta/iliac ultrasound  will be obtained and the patient will follow up with me to review these studies.  I suspect the patient is c/o pseudoclaudication or has diabetic neuropathy.  Patient should have an evaluation of her LS spine which I defer to the primary service.  The patient should continue walking and begin a more formal exercise program. The patient should continue his antiplatelet therapy and aggressive treatment of the lipid abnormalities.  The patient should begin wearing graduated compression socks 15-20 mmHg strength to control edema.   - VAS US AORTA/IVC/ILIACS; Future - VAS [place] ABI WITH/WO TBI; Future  2. PAD (peripheral artery disease) (HCC)  Recommend:  The patient has atypical pain symptoms for pure atherosclerotic disease.   Noninvasive studies including ABI's and Aorta/iliac ultrasound  will be obtained and the patient will follow up with me to review these studies.  I suspect the patient is c/o pseudoclaudication or has diabetic neuropathy.  Patient should have an evaluation of her LS spine which I defer to the primary service.  The patient should continue walking and begin a more formal exercise program. The patient should continue his antiplatelet therapy and aggressive treatment of the lipid abnormalities.  The patient should begin wearing graduated compression socks 15-20 mmHg strength to control edema.   - VAS US AORTA/IVC/ILIACS; Future - VAS [place] ABI WITH/WO TBI; Future  3. Essential hypertension Continue antihypertensive medications as already ordered, these medications have been reviewed and there are no changes at this time.   4. New onset type 2 diabetes mellitus ([place]) Continue hypoglycemic medications as already ordered, these medications have been reviewed and there are no changes at this time.  Hgb A1C to be monitored as already arranged by primary  service   5. Chronic obstructive pulmonary disease, unspecified COPD type ([place]) Continue pulmonary medications and aerosols as already ordered, these medications have been reviewed and there are no changes at this time.      Hortencia Pilar, MD  02/09/2020 8:55 AM

## 2020-03-09 ENCOUNTER — Telehealth: Payer: Self-pay | Admitting: Family

## 2020-03-09 ENCOUNTER — Other Ambulatory Visit: Payer: Self-pay

## 2020-03-09 ENCOUNTER — Encounter: Payer: Self-pay | Admitting: Family

## 2020-03-09 ENCOUNTER — Ambulatory Visit: Payer: BC Managed Care – PPO | Attending: Family | Admitting: Family

## 2020-03-09 VITALS — BP 109/74 | HR 86 | Resp 18 | Ht 61.0 in | Wt 164.1 lb

## 2020-03-09 DIAGNOSIS — Z79899 Other long term (current) drug therapy: Secondary | ICD-10-CM | POA: Insufficient documentation

## 2020-03-09 DIAGNOSIS — Z880 Allergy status to penicillin: Secondary | ICD-10-CM | POA: Insufficient documentation

## 2020-03-09 DIAGNOSIS — Z7982 Long term (current) use of aspirin: Secondary | ICD-10-CM | POA: Diagnosis not present

## 2020-03-09 DIAGNOSIS — Z794 Long term (current) use of insulin: Secondary | ICD-10-CM | POA: Diagnosis not present

## 2020-03-09 DIAGNOSIS — Z8249 Family history of ischemic heart disease and other diseases of the circulatory system: Secondary | ICD-10-CM | POA: Diagnosis not present

## 2020-03-09 DIAGNOSIS — E079 Disorder of thyroid, unspecified: Secondary | ICD-10-CM | POA: Insufficient documentation

## 2020-03-09 DIAGNOSIS — I5022 Chronic systolic (congestive) heart failure: Secondary | ICD-10-CM | POA: Diagnosis present

## 2020-03-09 DIAGNOSIS — I110 Hypertensive heart disease with heart failure: Secondary | ICD-10-CM | POA: Insufficient documentation

## 2020-03-09 DIAGNOSIS — Z888 Allergy status to other drugs, medicaments and biological substances status: Secondary | ICD-10-CM | POA: Diagnosis not present

## 2020-03-09 DIAGNOSIS — E119 Type 2 diabetes mellitus without complications: Secondary | ICD-10-CM | POA: Diagnosis not present

## 2020-03-09 DIAGNOSIS — Z881 Allergy status to other antibiotic agents status: Secondary | ICD-10-CM | POA: Insufficient documentation

## 2020-03-09 DIAGNOSIS — I10 Essential (primary) hypertension: Secondary | ICD-10-CM

## 2020-03-09 DIAGNOSIS — Z720 Tobacco use: Secondary | ICD-10-CM

## 2020-03-09 DIAGNOSIS — F17210 Nicotine dependence, cigarettes, uncomplicated: Secondary | ICD-10-CM | POA: Diagnosis not present

## 2020-03-09 DIAGNOSIS — J449 Chronic obstructive pulmonary disease, unspecified: Secondary | ICD-10-CM | POA: Diagnosis not present

## 2020-03-09 LAB — BASIC METABOLIC PANEL
Anion gap: 11 (ref 5–15)
BUN: 22 mg/dL — ABNORMAL HIGH (ref 6–20)
CO2: 32 mmol/L (ref 22–32)
Calcium: 9.3 mg/dL (ref 8.9–10.3)
Chloride: 97 mmol/L — ABNORMAL LOW (ref 98–111)
Creatinine, Ser: 1.55 mg/dL — ABNORMAL HIGH (ref 0.44–1.00)
GFR calc Af Amer: 42 mL/min — ABNORMAL LOW (ref >60)
GFR calc non Af Amer: 37 mL/min — ABNORMAL LOW (ref >60)
Glucose, Bld: 156 mg/dL — ABNORMAL HIGH (ref 70–99)
Potassium: 3.2 mmol/L — ABNORMAL LOW (ref 3.5–5.1)
Sodium: 140 mmol/L (ref 135–145)

## 2020-03-09 NOTE — Telephone Encounter (Signed)
Spoke with patient regarding lab results obtained today. Potassium is 3.2 and renal function is declining with creatinine 1.55/ GFR 42.   Patient had been re-started on weekly metolazone 5mg  without additional potassium. Advised patient to decrease her metolazone to 2.5mg  weekly (1/2 tablet) and when she takes the metolazone, she needs to take an additional 2 potassium tablets. Also advised her to take 2 additional potassium tablets today.   Instructed her that this needs to be rechecked in 2-4 weeks so when her PCP sees her, he can check it. Patient verbalized understanding.

## 2020-03-09 NOTE — Patient Instructions (Signed)
Continue weighing daily and call for an overnight weight gain of > 2 pounds or a weekly weight gain of >5 pounds. 

## 2020-03-09 NOTE — Progress Notes (Signed)
Patient ID: Heather Choi, female    DOB: 01/19/1961, 59 y.o.   MRN: [id number]  HPI  Heather Choi is a 59 y/o female with a history of asthma, DM, HTN, thyroid disease, bell's palsy, COPD, current tobacco use and chronic heart failure.   Echo report from 01/22/2019 reviewed and showed an EF of 25-30% along with moderate AR.   Admitted 12/23/19 due to new onset diabetes. Initially needed insulin drip & then transitioned to subcutaneous insulin. Given IVF due to AKI. Discharged after 5 days.   She presents today for a follow-up visit with a chief complaint of minimal shortness of breath upon moderate exertion. She describes this as chronic in nature having been present for several years. She has associated chest tightness, headaches, light-headedness, weakness and chronic difficulty sleeping along with this. She denies any abdominal distention, palpitations, pedal edema, chest pain, cough, fatigue or weight gain.   Has seen cardiology since last here and is now taking metolazone 27m weekly on Mondays. She says that she's been taking her insulin in the mornings but not BID because "a nurse that comes through the line at work at [employer]told me that I shouldn't be taking both insulin and metformin"     Past Medical History:  Diagnosis Date  . Asthma   . Bell's palsy    fingers tingling in cold weather  . [place]) [place]   . COPD (chronic obstructive pulmonary disease) ([place]   . Diabetes mellitus without complication ([place]   . Enlarged heart   . Hypertension   . Thyroid disease    Past Surgical History:  Procedure Laterality Date  . ABDOMINAL HYSTERECTOMY    . CHOLECYSTECTOMY    . IMPLANTABLE CARDIOVERTER DEFIBRILLATOR IMPLANT    . laparoscopic bilateral oophorectomy with removal of adnexal mass and lysis of adhesions  11/20/2017  . LAPAROSCOPY     with removal of adnexal structure   Family History  Problem Relation Age of Onset  . Heart failure Father   . Heart  attack Mother    Social History   Tobacco Use  . Smoking status: Current Every Day Smoker    Packs/day: 0.50    Types: Cigarettes  . Smokeless tobacco: Never Used  Substance Use Topics  . Alcohol use: Yes    Comment: rarely   Allergies  Allergen Reactions  . Clindamycin/Lincomycin     Burning sensastion  . Amlodipine Other (See Comments) and Itching  . Penicillins Other (See Comments)    Has patient had a PCN reaction causing immediate rash, facial/tongue/throat swelling, SOB or lightheadedness with hypotension: Yes Has patient had a PCN reaction causing severe rash involving mucus membranes or skin necrosis: No Has patient had a PCN reaction that required hospitalization: No Has patient had a PCN reaction occurring within the last 10 years: Yes If all of the above answers are "NO", then may proceed with Cephalosporin use.  .Marland KitchenHydrochlorothiazide Rash    Prior to Admission medications   Medication Sig Start Date End Date Taking? Authorizing Provider  Albuterol Sulfate (PROAIR RESPICLICK) 1962(90 Base) MCG/ACT AEPB Inhale 2 puffs into the lungs every 4 (four) hours as needed (shortness of breath / wheezing).    Yes [provider]  ALPRAZolam (Duanne Moron 0.5 MG tablet Take 0.5 mg by mouth daily.    Yes [provider]  aspirin EC 81 MG tablet Take 81 mg by mouth daily.    Yes [provider]  blood glucose meter  kit and supplies KIT Dispense based on patient and insurance preference. Use up to four times daily as directed. (FOR ICD-9 250.00, 250.01). 12/28/19  Yes Nicole Kindred A, DO  carvedilol (COREG) 25 MG tablet Take 25 mg by mouth 2 (two) times daily.  01/31/19  Yes [provider]  cetirizine (ZYRTEC) 10 MG tablet Take 10 mg by mouth daily.   Yes [provider]  digoxin (LANOXIN) 0.125 MG tablet Take 0.125 mg by mouth daily.   Yes [provider]  fluticasone (FLONASE) 50 MCG/ACT nasal spray Place 2 sprays into both nostrils  daily.   Yes [provider]  insulin aspart (NOVOLOG) 100 UNIT/ML FlexPen Inject 6 Units into the skin 3 (three) times daily with meals. 12/28/19  Yes Ezekiel Slocumb, DO  levothyroxine (SYNTHROID) 137 MCG tablet Take 137 mcg by mouth daily.  03/05/14  Yes [provider]  metFORMIN (GLUCOPHAGE-XR) 500 MG 24 hr tablet 500 mg. 02/01/20  Yes [provider]  metolazone (ZAROXOLYN) 5 MG tablet Take 5 mg by mouth once a week. On Mondays   Yes [provider]  ondansetron (ZOFRAN) 8 MG tablet Take 1 tablet (8 mg total) by mouth every 8 (eight) hours as needed for nausea or vomiting. 12/28/19 12/27/20 Yes Nicole Kindred A, DO  potassium chloride SA (K-DUR,KLOR-CON) 20 MEQ tablet Take 20 mEq by mouth 2 (two) times daily.  04/29/15  Yes [provider]  sacubitril-valsartan (ENTRESTO) 24-26 MG Take 1 tablet by mouth 2 (two) times daily. 01/06/20  Yes Darylene Price A, FNP  torsemide (DEMADEX) 100 MG tablet Take 0.5 tablets (50 mg total) by mouth daily. 01/02/20  Yes Anacristina Steffek, Otila Kluver A, FNP  Insulin Syringe-Needle U-100 (INSULIN SYRINGE .5CC/28G) 28G X 1/2" 0.5 ML MISC 1 each by Does not apply route 2 (two) times daily with a meal. Fill one syringe with 0.25 mL (25 units) of insulin twice daily with meals (breakfast and dinner). 12/28/19   Ezekiel Slocumb, DO  Liniments (SALONPAS PAIN RELIEF PATCH EX) Apply 1 patch topically daily as needed (pain).    [provider]    Review of Systems  Constitutional: Negative for appetite change and fatigue.  HENT: Negative for congestion, postnasal drip and sore throat.   Eyes: Negative.   Respiratory: Positive for chest tightness ("heavy at times") and shortness of breath. Negative for cough.   Cardiovascular: Negative for chest pain, palpitations and leg swelling.  Gastrointestinal: Negative for abdominal distention and abdominal pain.  Endocrine: Negative.   Genitourinary: Negative.   Musculoskeletal: Positive for back  pain. Negative for neck pain.  Skin: Negative.   Allergic/Immunologic: Negative.   Neurological: Positive for weakness (legs), light-headedness, numbness (fingertips with neuropathy) and headaches (improving). Negative for dizziness.  Hematological: Negative for adenopathy. Does not bruise/bleed easily.  Psychiatric/Behavioral: Positive for sleep disturbance (sleeping on 3 pillows). Negative for dysphoric mood. The patient is not nervous/anxious.    Vitals:   03/09/20 1246  BP: 109/74  Pulse: 86  Resp: 18  SpO2: 98%  Weight: 164 lb 2 oz (74.4 kg)  Height: _0  (1.549 m)   Wt Readings from Last 3 Encounters:  03/09/20 164 lb 2 oz (74.4 kg)  02/09/20 169 lb (76.7 kg)  01/28/20 168 lb (76.2 kg)   Lab Results  Component Value Date   CREATININE 1.38 (H) 01/28/2020   CREATININE 1.24 (H) 01/06/2020   CREATININE 1.00 12/28/2019    Physical Exam Vitals and nursing note reviewed.  Constitutional:  Appearance: She is well-developed.  HENT:     Head: Normocephalic and atraumatic.  Neck:     Vascular: No JVD.  Cardiovascular:     Rate and Rhythm: Normal rate and regular rhythm.  Pulmonary:     Effort: Pulmonary effort is normal. No respiratory distress.     Breath sounds: No wheezing or rales.  Abdominal:     General: There is no distension.     Tenderness: There is no abdominal tenderness.  Musculoskeletal:     Cervical back: Normal range of motion and neck supple.     Right lower leg: No tenderness. No edema.     Left lower leg: No tenderness. No edema.  Skin:    General: Skin is warm and dry.  Neurological:     General: No focal deficit present.     Mental Status: She is alert and oriented to person, place, and time.  Psychiatric:        Mood and Affect: Mood normal.        Behavior: Behavior normal.    Assessment & Plan:  1: Chronic heart failure with reduced ejection fraction- - NYHA class II - euvolemic today - weighing daily and she was reminded to call  for an overnight weight gain of >2 pounds or a weekly weight gain of >5 pounds - weight down 4 pounds from last visit here 6 weeks ago - not adding salt to her food and has been trying to read food labels for sodium content - saw cardiology (Paraschos) 02/23/20 & is now on weekly metolazone 22m - consider titrating entresto at future visits if BP allows - could add farxiga in the future  - BNP 12/23/19 was 110.0 - has received her first Moderna covid vaccine  2: HTN- - BP looks good today although on the low side - saw PCP (Baldemar Lenis 01/20/20 - BMP 01/28/20 reviewed and showed sodium 138, potassium 4.0, creatinine 1.38 and GFR 49 - will recheck labs today  3: DM- - A1c 12/23/19 was 10.8% - nonfasting glucose at home this morning was in the 150's; encouraged her to f/u with PCP regarding her insulin usage - saw vascular (Schnier) 02/09/20  4: Tobacco use- - smoking 5-6 cigarettes daily - complete cessation discussed for 3 minutes with her   Patient did not bring her medications nor a list. Each medication was verbally reviewed with the patient and she was encouraged to bring the bottles to every visit to confirm accuracy of list.  Return in 6 months or sooner for any questions/problems before then.

## 2020-03-11 ENCOUNTER — Ambulatory Visit: Payer: BC Managed Care – PPO | Admitting: Family

## 2020-03-25 ENCOUNTER — Ambulatory Visit (INDEPENDENT_AMBULATORY_CARE_PROVIDER_SITE_OTHER): Payer: BC Managed Care – PPO

## 2020-03-25 ENCOUNTER — Encounter (INDEPENDENT_AMBULATORY_CARE_PROVIDER_SITE_OTHER): Payer: Self-pay | Admitting: Nurse Practitioner

## 2020-03-25 ENCOUNTER — Ambulatory Visit (INDEPENDENT_AMBULATORY_CARE_PROVIDER_SITE_OTHER): Payer: BC Managed Care – PPO | Admitting: Nurse Practitioner

## 2020-03-25 ENCOUNTER — Other Ambulatory Visit: Payer: Self-pay

## 2020-03-25 VITALS — BP 95/65 | HR 86 | Resp 16 | Ht 61.0 in | Wt 163.0 lb

## 2020-03-25 DIAGNOSIS — M79605 Pain in left leg: Secondary | ICD-10-CM

## 2020-03-25 DIAGNOSIS — E119 Type 2 diabetes mellitus without complications: Secondary | ICD-10-CM

## 2020-03-25 DIAGNOSIS — M79604 Pain in right leg: Secondary | ICD-10-CM | POA: Diagnosis not present

## 2020-03-25 DIAGNOSIS — I739 Peripheral vascular disease, unspecified: Secondary | ICD-10-CM

## 2020-03-25 DIAGNOSIS — I10 Essential (primary) hypertension: Secondary | ICD-10-CM

## 2020-03-29 ENCOUNTER — Encounter (INDEPENDENT_AMBULATORY_CARE_PROVIDER_SITE_OTHER): Payer: Self-pay | Admitting: Nurse Practitioner

## 2020-03-29 NOTE — Progress Notes (Signed)
Subjective:    Patient ID: Heather Choi, female    DOB: 1961/02/27, 59 y.o.   MRN: [id number] Chief Complaint  Patient presents with  . Follow-up    ultrasound    Patient presents today for follow-up regarding lower extremity pain.  The patient was in this is been ongoing for about the last 6 months.  She notes that the pain is not constant however it happens mainly when she is walking.  Feels as if there is a heavy weight in her calf and there is a pulling sensation.  The patient also does endorse having some lower back issues.  She denies any rest pain or ulceration.  She denies any fever, chills, nausea, vomiting or diarrhea.  This pain has made it difficult for her to do everyday activities as she finds her self having to stop and rest before she can go long distances.  Today noninvasive studies show an ABI of 1.15 on the right and 1.18 on the left.  The patient has strong triphasic tibial artery waveforms with strong toe waveforms bilaterally.  The patient also underwent aortoiliac duplex which revealed no evidence of an abdominal aortic aneurysm.  The largest aortic measurement was 1.8 cm.  The patient had normal abdominal aorta and iliac vessels throughout.  There is no evidence of significant stenosis within the iliac vessels as well.   Review of Systems  Cardiovascular:       Leg pain  Musculoskeletal: Positive for back pain.  All other systems reviewed and are negative.      Objective:   Physical Exam Vitals reviewed.  HENT:     Head: Normocephalic.  Cardiovascular:     Rate and Rhythm: Normal rate and regular rhythm.     Pulses: Normal pulses.     Heart sounds: Normal heart sounds.  Pulmonary:     Effort: Pulmonary effort is normal.     Breath sounds: Normal breath sounds.  Musculoskeletal:        General: Tenderness present.  Neurological:     Mental Status: She is alert and oriented to person, place, and time.  Psychiatric:        Mood and Affect: Mood normal.          Behavior: Behavior normal.        Thought Content: Thought content normal.        Judgment: Judgment normal.     BP 95/65 (BP Location: Right Arm)   Pulse 86   Resp 16   Ht 5' 1"  (1.549 m)   Wt 163 lb (73.9 kg)   BMI 30.80 kg/m   Past Medical History:  Diagnosis Date  . Asthma   . Bell's palsy    fingers tingling in cold weather  . CHF (congestive heart failure) ([place])   . COPD (chronic obstructive pulmonary disease) ([place])   . Diabetes mellitus without complication ([place])   . Enlarged heart   . Hypertension   . Thyroid disease     Social History   Socioeconomic History  . Marital status: Single    Spouse name: Not on file  . Number of children: Not on file  . Years of education: Not on file  . Highest education level: Not on file  Occupational History  . Not on file  Tobacco Use  . Smoking status: Current Every Day Smoker    Packs/day: 0.50    Types: Cigarettes  . Smokeless tobacco: Never Used  Vaping Use  . Vaping Use:  Never used  Substance and Sexual Activity  . Alcohol use: Yes    Comment: rarely  . Drug use: No  . Sexual activity: Not Currently  Other Topics Concern  . Not on file  Social History Narrative  . Not on file   Social Determinants of Health   Financial Resource Strain:   . Difficulty of Paying Living Expenses:   Food Insecurity:   . Worried About Charity fundraiser in the Last Year:   . Arboriculturist in the Last Year:   Transportation Needs:   . Film/video editor (Medical):   Marland Kitchen Lack of Transportation (Non-Medical):   Physical Activity:   . Days of Exercise per Week:   . Minutes of Exercise per Session:   Stress:   . Feeling of Stress :   Social Connections:   . Frequency of Communication with Friends and Family:   . Frequency of Social Gatherings with Friends and Family:   . Attends Religious Services:   . Active Member of Clubs or Organizations:   . Attends Archivist Meetings:   Marland Kitchen Marital Status:    Intimate Partner Violence:   . Fear of Current or Ex-Partner:   . Emotionally Abused:   Marland Kitchen Physically Abused:   . Sexually Abused:     Past Surgical History:  Procedure Laterality Date  . ABDOMINAL HYSTERECTOMY    . CHOLECYSTECTOMY    . IMPLANTABLE CARDIOVERTER DEFIBRILLATOR IMPLANT    . laparoscopic bilateral oophorectomy with removal of adnexal mass and lysis of adhesions  11/20/2017  . LAPAROSCOPY     with removal of adnexal structure    Family History  Problem Relation Age of Onset  . Heart failure Father   . Heart attack Mother     Allergies  Allergen Reactions  . Clindamycin/Lincomycin     Burning sensastion  . Amlodipine Other (See Comments) and Itching  . Penicillins Other (See Comments)    Has patient had a PCN reaction causing immediate rash, facial/tongue/throat swelling, SOB or lightheadedness with hypotension: Yes Has patient had a PCN reaction causing severe rash involving mucus membranes or skin necrosis: No Has patient had a PCN reaction that required hospitalization: No Has patient had a PCN reaction occurring within the last 10 years: Yes If all of the above answers are "NO", then may proceed with Cephalosporin use.  Marland Kitchen Hydrochlorothiazide Rash       Assessment & Plan:   1. Pain in both lower extremities Recommend:  I do not find evidence of Vascular pathology that would explain the patient's symptoms  The patient has atypical pain symptoms for vascular disease  I do not find evidence of Vascular pathology that would explain the patient's symptoms and I suspect the patient is c/o pseudoclaudication.  Patient should have an evaluation of his LS spine which I defer to the primary service.  Noninvasive studies including venous ultrasound of the legs do not identify vascular problems  The patient should continue walking and begin a more formal exercise program. The patient should continue his antiplatelet therapy and aggressive treatment of the lipid  abnormalities. The patient should begin wearing graduated compression socks 15-20 mmHg strength to control her mild edema.  Patient will follow-up with me on a PRN basis  Further work-up of her lower extremity pain is deferred to the primary service and patient will be referred to neurosurgery   - Ambulatory referral to Neurosurgery  2. New onset type 2 diabetes mellitus ([place]) Continue  hypoglycemic medications as already ordered, these medications have been reviewed and there are no changes at this time.  Hgb A1C to be monitored as already arranged by primary service   3. Essential hypertension Continue antihypertensive medications as already ordered, these medications have been reviewed and there are no changes at this time.    Current Outpatient Medications on File Prior to Visit  Medication Sig Dispense Refill  . Albuterol Sulfate (PROAIR RESPICLICK) 468 (90 Base) MCG/ACT AEPB Inhale 2 puffs into the lungs every 4 (four) hours as needed (shortness of breath / wheezing).     . ALPRAZolam (XANAX) 0.5 MG tablet Take 0.5 mg by mouth daily.     Marland Kitchen aspirin EC 81 MG tablet Take 81 mg by mouth daily.     . blood glucose meter kit and supplies KIT Dispense based on patient and insurance preference. Use up to four times daily as directed. (FOR ICD-9 250.00, 250.01). 1 each 0  . carvedilol (COREG) 25 MG tablet Take 25 mg by mouth 2 (two) times daily.     . cetirizine (ZYRTEC) 10 MG tablet Take 10 mg by mouth daily.    . digoxin (LANOXIN) 0.125 MG tablet Take 0.125 mg by mouth daily.    . fluticasone (FLONASE) 50 MCG/ACT nasal spray Place 2 sprays into both nostrils daily.    . insulin aspart (NOVOLOG) 100 UNIT/ML FlexPen Inject 6 Units into the skin 3 (three) times daily with meals. 15 mL 1  . Insulin Syringe-Needle U-100 (INSULIN SYRINGE .5CC/28G) 28G X 1/2" 0.5 ML MISC 1 each by Does not apply route 2 (two) times daily with a meal. Fill one syringe with 0.25 mL (25 units) of insulin twice  daily with meals (breakfast and dinner). 100 each 1  . levothyroxine (SYNTHROID) 137 MCG tablet Take 137 mcg by mouth daily.     . Liniments (SALONPAS PAIN RELIEF PATCH EX) Apply 1 patch topically daily as needed (pain).    . metFORMIN (GLUCOPHAGE-XR) 500 MG 24 hr tablet 500 mg.    . metolazone (ZAROXOLYN) 5 MG tablet Take 5 mg by mouth once a week. On Mondays    . ondansetron (ZOFRAN) 8 MG tablet Take 1 tablet (8 mg total) by mouth every 8 (eight) hours as needed for nausea or vomiting. 30 tablet 0  . potassium chloride SA (K-DUR,KLOR-CON) 20 MEQ tablet Take 20 mEq by mouth 2 (two) times daily.     . sacubitril-valsartan (ENTRESTO) 24-26 MG Take 1 tablet by mouth 2 (two) times daily. 60 tablet 3  . torsemide (DEMADEX) 100 MG tablet Take 0.5 tablets (50 mg total) by mouth daily. (Patient not taking: Reported on 03/25/2020) 15 tablet 4   No current facility-administered medications on file prior to visit.    There are no Patient Instructions on file for this visit. No follow-ups on file.   Kris Hartmann, NP

## 2020-04-26 ENCOUNTER — Ambulatory Visit (INDEPENDENT_AMBULATORY_CARE_PROVIDER_SITE_OTHER): Payer: BC Managed Care – PPO | Admitting: Vascular Surgery

## 2020-04-26 ENCOUNTER — Encounter (INDEPENDENT_AMBULATORY_CARE_PROVIDER_SITE_OTHER): Payer: BC Managed Care – PPO

## 2020-05-17 ENCOUNTER — Other Ambulatory Visit
Admission: RE | Admit: 2020-05-17 | Discharge: 2020-05-17 | Disposition: A | Discharge status: Home / Self Care | Payer: BC Managed Care – PPO | Source: Ambulatory Visit | Attending: Physician Assistant | Admitting: Physician Assistant

## 2020-05-17 DIAGNOSIS — Z79899 Other long term (current) drug therapy: Secondary | ICD-10-CM | POA: Diagnosis not present

## 2020-05-17 LAB — DIGOXIN LEVEL: Digoxin Level: 0.4 ng/mL — ABNORMAL LOW (ref 0.8–2.0)

## 2020-05-18 ENCOUNTER — Other Ambulatory Visit: Payer: Self-pay | Admitting: Cardiology

## 2020-05-18 DIAGNOSIS — R140 Abdominal distension (gaseous): Secondary | ICD-10-CM

## 2020-05-20 ENCOUNTER — Other Ambulatory Visit: Payer: Self-pay | Admitting: Internal Medicine

## 2020-05-21 ENCOUNTER — Ambulatory Visit
Admission: RE | Admit: 2020-05-21 | Discharge: 2020-05-21 | Disposition: A | Discharge status: Home / Self Care | Payer: BC Managed Care – PPO | Source: Ambulatory Visit | Attending: Cardiology | Admitting: Cardiology

## 2020-05-21 ENCOUNTER — Other Ambulatory Visit: Payer: Self-pay

## 2020-05-21 DIAGNOSIS — R140 Abdominal distension (gaseous): Secondary | ICD-10-CM | POA: Diagnosis present

## 2020-06-26 ENCOUNTER — Encounter: Payer: Self-pay | Admitting: Emergency Medicine

## 2020-06-26 ENCOUNTER — Emergency Department: Payer: BC Managed Care – PPO

## 2020-06-26 ENCOUNTER — Emergency Department
Admission: EM | Admit: 2020-06-26 | Discharge: 2020-06-26 | Disposition: A | Discharge status: Home / Self Care | Payer: BC Managed Care – PPO | Attending: Emergency Medicine | Admitting: Emergency Medicine

## 2020-06-26 ENCOUNTER — Other Ambulatory Visit: Payer: Self-pay

## 2020-06-26 DIAGNOSIS — Z20822 Contact with and (suspected) exposure to covid-19: Secondary | ICD-10-CM | POA: Diagnosis not present

## 2020-06-26 DIAGNOSIS — Z79890 Hormone replacement therapy (postmenopausal): Secondary | ICD-10-CM | POA: Insufficient documentation

## 2020-06-26 DIAGNOSIS — J45909 Unspecified asthma, uncomplicated: Secondary | ICD-10-CM | POA: Diagnosis not present

## 2020-06-26 DIAGNOSIS — N1831 Chronic kidney disease, stage 3a: Secondary | ICD-10-CM | POA: Insufficient documentation

## 2020-06-26 DIAGNOSIS — J069 Acute upper respiratory infection, unspecified: Secondary | ICD-10-CM | POA: Diagnosis not present

## 2020-06-26 DIAGNOSIS — Z79899 Other long term (current) drug therapy: Secondary | ICD-10-CM | POA: Diagnosis not present

## 2020-06-26 DIAGNOSIS — Z95810 Presence of automatic (implantable) cardiac defibrillator: Secondary | ICD-10-CM | POA: Insufficient documentation

## 2020-06-26 DIAGNOSIS — R432 Parageusia: Secondary | ICD-10-CM | POA: Diagnosis present

## 2020-06-26 DIAGNOSIS — E039 Hypothyroidism, unspecified: Secondary | ICD-10-CM | POA: Insufficient documentation

## 2020-06-26 DIAGNOSIS — E1122 Type 2 diabetes mellitus with diabetic chronic kidney disease: Secondary | ICD-10-CM | POA: Diagnosis not present

## 2020-06-26 DIAGNOSIS — F17210 Nicotine dependence, cigarettes, uncomplicated: Secondary | ICD-10-CM | POA: Diagnosis not present

## 2020-06-26 DIAGNOSIS — Z794 Long term (current) use of insulin: Secondary | ICD-10-CM | POA: Diagnosis not present

## 2020-06-26 DIAGNOSIS — J449 Chronic obstructive pulmonary disease, unspecified: Secondary | ICD-10-CM | POA: Diagnosis not present

## 2020-06-26 DIAGNOSIS — Z7982 Long term (current) use of aspirin: Secondary | ICD-10-CM | POA: Insufficient documentation

## 2020-06-26 DIAGNOSIS — I5022 Chronic systolic (congestive) heart failure: Secondary | ICD-10-CM | POA: Insufficient documentation

## 2020-06-26 DIAGNOSIS — I130 Hypertensive heart and chronic kidney disease with heart failure and stage 1 through stage 4 chronic kidney disease, or unspecified chronic kidney disease: Secondary | ICD-10-CM | POA: Diagnosis not present

## 2020-06-26 LAB — CBC WITH DIFFERENTIAL/PLATELET
Abs Immature Granulocytes: 0.01 10*3/uL (ref 0.00–0.07)
Basophils Absolute: 0 10*3/uL (ref 0.0–0.1)
Basophils Relative: 1 %
Eosinophils Absolute: 0.2 10*3/uL (ref 0.0–0.5)
Eosinophils Relative: 3 %
HCT: 41.1 % (ref 36.0–46.0)
Hemoglobin: 13.6 g/dL (ref 12.0–15.0)
Immature Granulocytes: 0 %
Lymphocytes Relative: 31 %
Lymphs Abs: 1.6 10*3/uL (ref 0.7–4.0)
MCH: 27.1 pg (ref 26.0–34.0)
MCHC: 33.1 g/dL (ref 30.0–36.0)
MCV: 82 fL (ref 80.0–100.0)
Monocytes Absolute: 0.3 10*3/uL (ref 0.1–1.0)
Monocytes Relative: 6 %
Neutro Abs: 3 10*3/uL (ref 1.7–7.7)
Neutrophils Relative %: 59 %
Platelets: 135 10*3/uL — ABNORMAL LOW (ref 150–400)
RBC: 5.01 MIL/uL (ref 3.87–5.11)
RDW: 14.6 % (ref 11.5–15.5)
WBC: 5.2 10*3/uL (ref 4.0–10.5)
nRBC: 0 % (ref 0.0–0.2)

## 2020-06-26 LAB — COMPREHENSIVE METABOLIC PANEL
ALT: 12 U/L (ref 0–44)
AST: 17 U/L (ref 15–41)
Albumin: 3.9 g/dL (ref 3.5–5.0)
Alkaline Phosphatase: 72 U/L (ref 38–126)
Anion gap: 10 (ref 5–15)
BUN: 14 mg/dL (ref 6–20)
CO2: 27 mmol/L (ref 22–32)
Calcium: 8.9 mg/dL (ref 8.9–10.3)
Chloride: 102 mmol/L (ref 98–111)
Creatinine, Ser: 1.21 mg/dL — ABNORMAL HIGH (ref 0.44–1.00)
GFR calc Af Amer: 57 mL/min — ABNORMAL LOW (ref >60)
GFR calc non Af Amer: 49 mL/min — ABNORMAL LOW (ref >60)
Glucose, Bld: 111 mg/dL — ABNORMAL HIGH (ref 70–99)
Potassium: 3.8 mmol/L (ref 3.5–5.1)
Sodium: 139 mmol/L (ref 135–145)
Total Bilirubin: 0.8 mg/dL (ref 0.3–1.2)
Total Protein: 7.3 g/dL (ref 6.5–8.1)

## 2020-06-26 LAB — RESPIRATORY PANEL BY RT PCR (FLU A&B, COVID)
Influenza A by PCR: NEGATIVE
Influenza B by PCR: NEGATIVE
SARS Coronavirus 2 by RT PCR: NEGATIVE

## 2020-06-26 LAB — BRAIN NATRIURETIC PEPTIDE: B Natriuretic Peptide: 280.5 pg/mL — ABNORMAL HIGH (ref 0.0–100.0)

## 2020-06-26 NOTE — ED Provider Notes (Signed)
[facility] Emergency Department Provider Note  ____________________________________________   First MD Initiated Contact with Patient 06/26/20 1208     (approximate)  I have reviewed the triage vital signs and the nursing notes.   HISTORY  Chief Complaint Covid Symptoms  HPI Heather Choi is a 59 y.o. female with complaint of loss of taste, body aches and nasal congestion x2 days.  The patient did have a positive Covid exposure from a coworker where she works at [employer].  Currently, the patient complains of generalized body, congestion and shortness of breath. the patient of note does have a history of congestive heart failure as well as COPD.  She states this shortness of breath feels different than that of her baseline heart failure and COPD.  Not had any fevers at home and denies sore throat, denies chest pain, denies abdominal pain.  She did have one episode of diarrhea this morning.       Past Medical History:  Diagnosis Date  . Asthma   . Bell's palsy    fingers tingling in cold weather  . CHF (congestive heart failure) ([place])   . COPD (chronic obstructive pulmonary disease) ([place])   . Diabetes mellitus without complication ([place])   . Enlarged heart   . Hypertension   . Thyroid disease     Patient Active Problem List   Diagnosis Date Noted  . Leg pain 02/09/2020  . PAD (peripheral artery disease) ([place]) 02/09/2020  . Hyperosmolar hyperglycemic state (HHS) ([place]) 12/23/2019  . New onset type 2 diabetes mellitus ([place]) 12/23/2019  . Conjunctivitis, right eye 12/23/2019  . AKI (acute kidney injury) ([place]) 12/23/2019  . Stage 3 chronic kidney disease 07/22/2019  . Chronic systolic CHF (congestive heart failure) ([place]) 01/20/2019  . COPD (chronic obstructive pulmonary disease) ([place]) 01/20/2019  . HTN (hypertension) 01/20/2019  . Hypothyroidism 01/20/2019  . Anxiety 01/20/2019  . Pelvic mass 12/26/2017  . Preop cardiovascular exam 11/12/2017  .  Heart failure ([place]) 10/31/2017  . Chest pain 11/11/2016  . S/P cardiac catheterization 06/11/2014  . Pedal edema 05/08/2014  . SOB (shortness of breath) on exertion 05/08/2014  . Graves disease 05/06/2014  . Iron deficiency anemia 05/06/2014  . 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 SVT (supraventricular tachycardia) ([place]) 05/06/2014  . Anemia, unspecified 03/05/2014  . Benign essential hypertension 03/05/2014  . Headache(784.0) 03/05/2014    Past Surgical History:  Procedure Laterality Date  . ABDOMINAL HYSTERECTOMY    . CHOLECYSTECTOMY    . IMPLANTABLE CARDIOVERTER DEFIBRILLATOR IMPLANT    . laparoscopic bilateral oophorectomy with removal of adnexal mass and lysis of adhesions  11/20/2017  . LAPAROSCOPY     with removal of adnexal structure    Prior to Admission medications   Medication Sig Start Date End Date Taking? Authorizing Provider  Albuterol Sulfate (PROAIR RESPICLICK) 245 (90 Base) MCG/ACT AEPB Inhale 2 puffs into the lungs every 4 (four) hours as needed (shortness of breath / wheezing).     [provider]  ALPRAZolam Duanne Moron) 0.5 MG tablet Take 0.5 mg by mouth daily.     [provider]  aspirin EC 81 MG tablet Take 81 mg by mouth daily.     [provider]  blood glucose meter kit and supplies KIT Dispense based on patient and insurance preference. Use up to four times daily as directed. (FOR ICD-9 250.00, 250.01). 12/28/19   Nicole Kindred A, DO  carvedilol (COREG) 25 MG tablet Take 25 mg by mouth 2 (two) times  daily.  01/31/19   [provider]  cetirizine (ZYRTEC) 10 MG tablet Take 10 mg by mouth daily.    [provider]  digoxin (LANOXIN) 0.125 MG tablet Take 0.125 mg by mouth daily.    [provider]  fluticasone (FLONASE) 50 MCG/ACT nasal spray Place 2 sprays into both nostrils daily.    [provider]  insulin aspart (NOVOLOG) 100 UNIT/ML FlexPen Inject 6 Units into the skin 3 (three) times daily  with meals. 12/28/19   Ezekiel Slocumb, DO  Insulin Syringe-Needle U-100 (INSULIN SYRINGE .5CC/28G) 28G X 1/2" 0.5 ML MISC 1 each by Does not apply route 2 (two) times daily with a meal. Fill one syringe with 0.25 mL (25 units) of insulin twice daily with meals (breakfast and dinner). 12/28/19   Ezekiel Slocumb, DO  levothyroxine (SYNTHROID) 137 MCG tablet Take 137 mcg by mouth daily.  03/05/14   [provider]  Liniments (SALONPAS PAIN RELIEF PATCH EX) Apply 1 patch topically daily as needed (pain).    [provider]  metFORMIN (GLUCOPHAGE-XR) 500 MG 24 hr tablet 500 mg. 02/01/20   [provider]  metolazone (ZAROXOLYN) 5 MG tablet Take 5 mg by mouth once a week. On Mondays    [provider]  ondansetron (ZOFRAN) 8 MG tablet Take 1 tablet (8 mg total) by mouth every 8 (eight) hours as needed for nausea or vomiting. 12/28/19 12/27/20  Nicole Kindred A, DO  potassium chloride SA (K-DUR,KLOR-CON) 20 MEQ tablet Take 20 mEq by mouth 2 (two) times daily.  04/29/15   [provider]  sacubitril-valsartan (ENTRESTO) 24-26 MG Take 1 tablet by mouth 2 (two) times daily. 01/06/20   Alisa Graff, FNP  torsemide (DEMADEX) 100 MG tablet Take 0.5 tablets (50 mg total) by mouth daily. Patient not taking: Reported on 03/25/2020 01/02/20   Alisa Graff, FNP    Allergies Clindamycin/lincomycin, Amlodipine, Penicillins, and Hydrochlorothiazide  Family History  Problem Relation Age of Onset  . Heart failure Father   . Heart attack Mother     Social History Social History   Tobacco Use  . Smoking status: Current Every Day Smoker    Packs/day: 0.50    Types: Cigarettes  . Smokeless tobacco: Never Used  Vaping Use  . Vaping Use: Never used  Substance Use Topics  . Alcohol use: Yes    Comment: rarely  . Drug use: No    Review of Systems Constitutional: No fever/chills, + body aches Eyes: No visual changes. ENT: No sore throat. + Nasal  congestion Cardiovascular: Denies chest pain. Respiratory: + shortness of breath, -cough, -insomnia Gastrointestinal: No abdominal pain.  No nausea, no vomiting.  No diarrhea.  No constipation. Genitourinary: Negative for dysuria. Musculoskeletal: Negative for back pain. Skin: Negative for rash. Neurological: Negative for headaches, focal weakness or numbness.  ____________________________________________   PHYSICAL EXAM:  VITAL SIGNS: ED Triage Vitals [06/26/20 1152]  Enc Vitals Group     BP 126/68     Pulse Rate 61     Resp 18     Temp 98.3 F (36.8 C)     Temp Source Oral     SpO2 96 %     Weight 160 lb (72.6 kg)     Height 5' 2"  (1.575 m)     Head Circumference      Peak Flow      Pain Score 7     Pain Loc      Pain Edu?  Excl. in [place]?    Constitutional: Alert and oriented. Well appearing and in no acute distress. Eyes: Conjunctivae are normal. PERRL. EOMI. Head: Atraumatic. Nose: + congestion/rhinnorhea. Ears: The right TM is visualized pearly gray with appropriate landmarks and no bulging or erythema.  The left TM is unable to be visualized secondary to cerumen. Mouth/Throat: Mucous membranes are moist.  Oropharynx non-erythematous. Neck: No stridor.   Cardiovascular: Normal rate, regular rhythm. Good peripheral circulation. Respiratory: Normal respiratory effort.  No retractions. Lungs CTAB.  No wheezing, rales or rhonchi appreciated. Gastrointestinal: Soft and nontender. No distention. No CVA tenderness. Musculoskeletal: No lower extremity tenderness nor edema.  No joint effusions. Neurologic:  Normal speech and language. No gross focal neurologic deficits are appreciated. No gait instability. Skin:  Skin is warm, dry and intact. No rash noted. Psychiatric: Mood and affect are normal. Speech and behavior are normal.  ____________________________________________   LABS (all labs ordered are listed, but only abnormal results are displayed)  Labs Reviewed   CBC WITH DIFFERENTIAL/PLATELET - Abnormal; Notable for the following components:      Result Value   Platelets 135 (*)    All other components within normal limits  BRAIN NATRIURETIC PEPTIDE - Abnormal; Notable for the following components:   B Natriuretic Peptide 280.5 (*)    All other components within normal limits  COMPREHENSIVE METABOLIC PANEL - Abnormal; Notable for the following components:   Glucose, Bld 111 (*)    Creatinine, Ser 1.21 (*)    GFR calc non Af Amer 49 (*)    GFR calc Af Amer 57 (*)    All other components within normal limits  RESPIRATORY PANEL BY RT PCR (FLU A&B, COVID)   ____________________________________________  RADIOLOGY  Official radiology report(s): DG Chest Portable 1 View  Result Date: 06/26/2020 CLINICAL DATA:  Patient with generalized body aches and nasal congestion. EXAM: PORTABLE CHEST 1 VIEW COMPARISON:  Chest radiograph 12/23/2019. FINDINGS: Multi lead pacer apparatus overlies the left hemithorax. Stable cardiac and mediastinal contours, mildly enlarged. No large area pulmonary consolidation. No pleural effusion or pneumothorax. IMPRESSION: No acute cardiopulmonary process. Electronically Signed   By: Lovey Newcomer M.D.   On: 06/26/2020 13:50   ____________________________________________   INITIAL IMPRESSION / ASSESSMENT AND PLAN / ED COURSE  As part of my medical decision making, I reviewed the following data within the [place] notes reviewed and incorporated and Notes from prior ED visits        Gwenith Tschida 59 year old female with a past medical history of multiple problems including congestive heart failure and COPD who presents with nasal congestion, loss of taste and shortness of breath as well as body aches.  Physical exam does not demonstrate any significant pulmonary sounds.  Laboratory work-up includes negative respiratory panel, stable kidney function, decreased platelets though stable for patient, and  mild bump in BNP at 280.5. Chest x-ray is negative for any acute process. Given the patient's relatively stable labs as well as normal chest film and reassuring physical exam, feel that this is likely a viral URI causing symptoms. Recommend that the patient treat symptomatically and follow-up with primary care next week. She should return to the emergency department for any worsening symptoms or worsening shortness of breath. Is amenable with this plan.      ____________________________________________   FINAL CLINICAL IMPRESSION(S) / ED DIAGNOSES  Final diagnoses:  Viral upper respiratory tract infection     ED Discharge Orders    None      *  Please note:  DELYNDA SEPULVEDA was evaluated in Emergency Department on 06/26/2020 for the symptoms described in the history of present illness. She was evaluated in the context of the global COVID-19 pandemic, which necessitated consideration that the patient might be at risk for infection with the SARS-CoV-2 virus that causes COVID-19. Institutional protocols and algorithms that pertain to the evaluation of patients at risk for COVID-19 are in a state of rapid change based on information released by regulatory bodies including the CDC and federal and state organizations. These policies and algorithms were followed during the patient's care in the ED.  Some ED evaluations and interventions may be delayed as a result of limited staffing during and the pandemic.*   Note:  This document was prepared using Dragon voice recognition software and may include unintentional dictation errors.    Marlana Salvage, PA 06/26/20 1633    Naaman Plummer, MD 06/27/20 [phone]

## 2020-06-26 NOTE — ED Triage Notes (Signed)
Pt presents to ED via POV with c/o loss of taste, generalized body aches, and nasal congestion. Pt endorses recent exposure to covid+ person at this time. Pt A&O x4. NAD noted in triage.

## 2020-06-26 NOTE — Discharge Instructions (Signed)
Please follow-up with your primary care in the next 1 to 2 weeks.

## 2020-06-26 NOTE — ED Notes (Signed)
In to swab pt and pt is upset that we have to swab inside of her nose. Pt states she wants it done on the end. Explained the process to pt and pt stated she did not want the swab that far in her nose and it was uneccessary. Explained to pt per the policy to get a correct swab, we did have to do that,. Advised pt she could refuse the swab. Pt wanted to swab herself. Advised pt I could not send an inaccurate swab. Pt crying and states this is the worst thing in her life. Advised pt to take a few minutes and to calm down and that it would be ok. PA notified.

## 2020-09-04 NOTE — Progress Notes (Signed)
Patient ID: Heather Choi, female    DOB: 05-31-1961, 59 y.o.   MRN: [id number]  HPI  Heather Choi is a 59 y/o female with a history of asthma, DM, HTN, thyroid disease, bell's palsy, COPD, current tobacco use and chronic heart failure.   Echo report from 01/22/2019 reviewed and showed an EF of 25-30% along with moderate AR.   Was in the ED 06/26/20 due to URI symptoms and covid exposure. CXR negative. COVID negative and she was released.    She presents today for a follow-up visit with a chief complaint of minimal shortness of breath upon moderate exertion. She describes this as chronic in nature having been present for several years. She has associated abdominal distention, headaches, light-headedness, difficulty sleeping and fluctuating weight along with this. She denies any palpitations, pedal edema, chest pain, cough or fatigue.   She says that her biggest complaint is that she continues to feel like she has swelling in her abdomen.    Past Medical History:  Diagnosis Date  . Asthma   . Bell's palsy    fingers tingling in cold weather  . CHF (congestive heart failure) ([place])   . COPD (chronic obstructive pulmonary disease) ([place])   . Diabetes mellitus without complication ([place])   . Enlarged heart   . Hypertension   . Thyroid disease    Past Surgical History:  Procedure Laterality Date  . ABDOMINAL HYSTERECTOMY    . CHOLECYSTECTOMY    . IMPLANTABLE CARDIOVERTER DEFIBRILLATOR IMPLANT    . laparoscopic bilateral oophorectomy with removal of adnexal mass and lysis of adhesions  11/20/2017  . LAPAROSCOPY     with removal of adnexal structure   Family History  Problem Relation Age of Onset  . Heart failure Father   . Heart attack Mother    Social History   Tobacco Use  . Smoking status: Current Every Day Smoker    Packs/day: 0.50    Types: Cigarettes  . Smokeless tobacco: Never Used  Substance Use Topics  . Alcohol use: Yes    Comment: rarely   Allergies  Allergen Reactions   . Clindamycin/Lincomycin     Burning sensastion  . Amlodipine Other (See Comments) and Itching  . Penicillins Other (See Comments)    Has patient had a PCN reaction causing immediate rash, facial/tongue/throat swelling, SOB or lightheadedness with hypotension: Yes Has patient had a PCN reaction causing severe rash involving mucus membranes or skin necrosis: No Has patient had a PCN reaction that required hospitalization: No Has patient had a PCN reaction occurring within the last 10 years: Yes If all of the above answers are "NO", then may proceed with Cephalosporin use.  Marland Kitchen Hydrochlorothiazide Rash   Prior to Admission medications   Medication Sig Start Date End Date Taking? Authorizing Provider  Albuterol Sulfate (PROAIR RESPICLICK) 373 (90 Base) MCG/ACT AEPB Inhale 2 puffs into the lungs every 4 (four) hours as needed (shortness of breath / wheezing).    Yes [provider]  ALPRAZolam Duanne Moron) 0.5 MG tablet Take 0.5 mg by mouth daily.    Yes [provider]  aspirin EC 81 MG tablet Take 81 mg by mouth daily.    Yes [provider]  blood glucose meter kit and supplies KIT Dispense based on patient and insurance preference. Use up to four times daily as directed. (FOR ICD-9 250.00, 250.01). 12/28/19  Yes Nicole Kindred A, DO  carvedilol (COREG) 12.5 MG tablet Take 12.5 mg by mouth 2 (two) times  daily.  01/31/19  Yes [provider]  cetirizine (ZYRTEC) 10 MG tablet Take 10 mg by mouth daily.   Yes [provider]  digoxin (LANOXIN) 0.125 MG tablet Take 0.125 mg by mouth daily.   Yes [provider]  fluticasone (FLONASE) 50 MCG/ACT nasal spray Place 2 sprays into both nostrils daily.   Yes [provider]  insulin aspart (NOVOLOG) 100 UNIT/ML FlexPen Inject 6 Units into the skin 3 (three) times daily with meals. 12/28/19  Yes Nicole Kindred A, DO  Insulin Syringe-Needle U-100 (INSULIN SYRINGE .5CC/28G) 28G X 1/2" 0.5 ML MISC 1 each  by Does not apply route 2 (two) times daily with a meal. Fill one syringe with 0.25 mL (25 units) of insulin twice daily with meals (breakfast and dinner). 12/28/19  Yes Ezekiel Slocumb, DO  levothyroxine (SYNTHROID) 137 MCG tablet Take 137 mcg by mouth daily.  03/05/14  Yes [provider]  Liniments (SALONPAS PAIN RELIEF PATCH EX) Apply 1 patch topically daily as needed (pain).   Yes [provider]  metFORMIN (GLUCOPHAGE-XR) 500 MG 24 hr tablet 500 mg. 02/01/20  Yes [provider]  ondansetron (ZOFRAN) 8 MG tablet Take 1 tablet (8 mg total) by mouth every 8 (eight) hours as needed for nausea or vomiting. 12/28/19 12/27/20 Yes Nicole Kindred A, DO  potassium chloride SA (K-DUR,KLOR-CON) 20 MEQ tablet Take 20 mEq by mouth 2 (two) times daily.  04/29/15  Yes [provider]  sacubitril-valsartan (ENTRESTO) 24-26 MG Take 1 tablet by mouth 2 (two) times daily. 01/06/20  Yes Darylene Price A, FNP  torsemide (DEMADEX) 100 MG tablet Take 0.5 tablets (50 mg total) by mouth daily. 01/02/20  Yes Coreyon Nicotra A, FNP  metolazone (ZAROXOLYN) 5 MG tablet Take 5 mg by mouth once a week. On Mondays    [provider]    Review of Systems  Constitutional: Negative for appetite change and fatigue.  HENT: Negative for congestion, postnasal drip and sore throat.   Eyes: Negative.   Respiratory: Positive for shortness of breath. Negative for cough and chest tightness.   Cardiovascular: Negative for chest pain, palpitations and leg swelling.  Gastrointestinal: Positive for abdominal distention. Negative for abdominal pain.  Endocrine: Negative.   Genitourinary: Negative.   Musculoskeletal: Positive for back pain. Negative for neck pain.  Skin: Negative.   Allergic/Immunologic: Negative.   Neurological: Positive for weakness (legs), light-headedness and headaches. Negative for dizziness and numbness.  Hematological: Negative for adenopathy. Does not bruise/bleed easily.   Psychiatric/Behavioral: Positive for sleep disturbance (sleeping on 3 pillows). Negative for dysphoric mood. The patient is not nervous/anxious.    Vitals:   09/06/20 1306  BP: 125/77  Pulse: 85  Resp: 20  SpO2: 98%  Weight: 162 lb (73.5 kg)  Height: 5' 1"  (1.549 m)   Wt Readings from Last 3 Encounters:  09/06/20 162 lb (73.5 kg)  06/26/20 160 lb (72.6 kg)  03/25/20 163 lb (73.9 kg)   Lab Results  Component Value Date   CREATININE 1.21 (H) 06/26/2020   CREATININE 1.55 (H) 03/09/2020   CREATININE 1.38 (H) 01/28/2020    Physical Exam Vitals and nursing note reviewed.  Constitutional:      Appearance: She is well-developed.  HENT:     Head: Normocephalic and atraumatic.  Neck:     Vascular: No JVD.  Cardiovascular:     Rate and Rhythm: Normal rate and regular rhythm.  Pulmonary:     Effort: Pulmonary effort is normal. No  respiratory distress.     Breath sounds: No wheezing or rales.  Abdominal:     General: There is no distension.     Tenderness: There is no abdominal tenderness.  Musculoskeletal:     Cervical back: Normal range of motion and neck supple.     Right lower leg: No tenderness. No edema.     Left lower leg: No tenderness. No edema.  Skin:    General: Skin is warm and dry.  Neurological:     General: No focal deficit present.     Mental Status: She is alert and oriented to person, place, and time.  Psychiatric:        Mood and Affect: Mood normal.        Behavior: Behavior normal.    Assessment & Plan:  1: Chronic heart failure with reduced ejection fraction- - NYHA class II - euvolemic today - weighing daily and she was reminded to call for an overnight weight gain of >2 pounds or a weekly weight gain of >5 pounds - weight down 2 pounds from last visit here 6 months ago - not adding salt to her food and has been trying to read food labels for sodium content - saw cardiology Margarito Courser) 08/30/20 - will add spironolactone 12.78m daily - check BMP  at next visit and possibly stop potassium supplements - could add farxiga in the future  - BNP 06/26/20 was 280.5 - has received both Moderna covid vaccine  2: HTN- - BP looks good today - saw PCP (Baldemar Lenis 05/31/20 - BMP 06/26/20 reviewed and showed sodium 139, potassium 3.8, creatinine 1.21 and GFR 57  3: DM- - A1c 05/14/20 was 5.4% - nonfasting glucose at home this morning was 117 - saw vascular (Schnier) 02/09/20 - she asks that an A1c be checked in 2 weeks when we are checking BMP  4: Tobacco use- - smoking 5-6 cigarettes daily - complete cessation discussed for 3 minutes with her   Patient did not bring her medications nor a list. Each medication was verbally reviewed with the patient and she was encouraged to bring the bottles to every visit to confirm accuracy of list.  Return in 2 weeks or sooner for any questions/problems before then.

## 2020-09-06 ENCOUNTER — Ambulatory Visit: Payer: BC Managed Care – PPO | Attending: Family | Admitting: Family

## 2020-09-06 ENCOUNTER — Encounter: Payer: Self-pay | Admitting: Family

## 2020-09-06 ENCOUNTER — Other Ambulatory Visit: Payer: Self-pay

## 2020-09-06 VITALS — BP 125/77 | HR 85 | Resp 20 | Ht 61.0 in | Wt 162.0 lb

## 2020-09-06 DIAGNOSIS — E119 Type 2 diabetes mellitus without complications: Secondary | ICD-10-CM | POA: Insufficient documentation

## 2020-09-06 DIAGNOSIS — J449 Chronic obstructive pulmonary disease, unspecified: Secondary | ICD-10-CM | POA: Diagnosis not present

## 2020-09-06 DIAGNOSIS — Z880 Allergy status to penicillin: Secondary | ICD-10-CM | POA: Diagnosis not present

## 2020-09-06 DIAGNOSIS — Z8249 Family history of ischemic heart disease and other diseases of the circulatory system: Secondary | ICD-10-CM | POA: Insufficient documentation

## 2020-09-06 DIAGNOSIS — Z7982 Long term (current) use of aspirin: Secondary | ICD-10-CM | POA: Diagnosis not present

## 2020-09-06 DIAGNOSIS — Z794 Long term (current) use of insulin: Secondary | ICD-10-CM | POA: Insufficient documentation

## 2020-09-06 DIAGNOSIS — Z720 Tobacco use: Secondary | ICD-10-CM

## 2020-09-06 DIAGNOSIS — I10 Essential (primary) hypertension: Secondary | ICD-10-CM

## 2020-09-06 DIAGNOSIS — E079 Disorder of thyroid, unspecified: Secondary | ICD-10-CM | POA: Diagnosis not present

## 2020-09-06 DIAGNOSIS — I110 Hypertensive heart disease with heart failure: Secondary | ICD-10-CM | POA: Diagnosis not present

## 2020-09-06 DIAGNOSIS — Z881 Allergy status to other antibiotic agents status: Secondary | ICD-10-CM | POA: Insufficient documentation

## 2020-09-06 DIAGNOSIS — Z79899 Other long term (current) drug therapy: Secondary | ICD-10-CM | POA: Insufficient documentation

## 2020-09-06 DIAGNOSIS — I5022 Chronic systolic (congestive) heart failure: Secondary | ICD-10-CM | POA: Diagnosis present

## 2020-09-06 DIAGNOSIS — F17210 Nicotine dependence, cigarettes, uncomplicated: Secondary | ICD-10-CM | POA: Diagnosis not present

## 2020-09-06 MED ORDER — SPIRONOLACTONE 25 MG PO TABS: 12.5000 mg | ORAL_TABLET | Freq: Every day | ORAL | 3 refills | Status: DC

## 2020-09-06 NOTE — Patient Instructions (Signed)
Continue weighing daily and call for an overnight weight gain of > 2 pounds or a weekly weight gain of >5 pounds. 

## 2020-09-19 NOTE — Progress Notes (Signed)
Patient ID: Heather Choi, female    DOB: 1961-02-24, 59 y.o.   MRN: [id number]  HPI  Ms Kessinger is a 59 y/o female with a history of asthma, DM, HTN, thyroid disease, bell's palsy, COPD, current tobacco use and chronic heart failure.   Echo report from 01/22/2019 reviewed and showed an EF of 25-30% along with moderate AR.   Was in the ED 06/26/20 due to URI symptoms and covid exposure. CXR negative. COVID negative and she was released.    She presents today for a follow-up visit with a chief complaint of minimal shortness of breath upon moderate exertion. She describes this as chronic in nature having been present for several months. She has associated abdominal distention, light-headedness, headaches, leg weakness, nausea, hand cramping and difficulty sleeping along with this. She denies any palpitations, pedal edema, chest pain, cough, fatigue or weight gain.   Reports some hand cramping at times but doesn't know if it's related to her medications or because it's gotten cold outside and she has arthritis in her hands. Notes dizziness if she takes all her medications at the same time so takes her torsemide separately.   Requests something for her nausea that she used to take that helped quite a bit but she can't remember the name of it.   Past Medical History:  Diagnosis Date  . Asthma   . Bell's palsy    fingers tingling in cold weather  . CHF (congestive heart failure) ([place])   . COPD (chronic obstructive pulmonary disease) ([place])   . Diabetes mellitus without complication ([place])   . Enlarged heart   . Hypertension   . Thyroid disease    Past Surgical History:  Procedure Laterality Date  . ABDOMINAL HYSTERECTOMY    . CHOLECYSTECTOMY    . IMPLANTABLE CARDIOVERTER DEFIBRILLATOR IMPLANT    . laparoscopic bilateral oophorectomy with removal of adnexal mass and lysis of adhesions  11/20/2017  . LAPAROSCOPY     with removal of adnexal structure   Family History  Problem Relation Age of  Onset  . Heart failure Father   . Heart attack Mother    Social History   Tobacco Use  . Smoking status: Current Every Day Smoker    Packs/day: 0.50    Types: Cigarettes  . Smokeless tobacco: Never Used  Substance Use Topics  . Alcohol use: Yes    Comment: rarely   Allergies  Allergen Reactions  . Clindamycin/Lincomycin     Burning sensastion  . Amlodipine Other (See Comments) and Itching  . Penicillins Other (See Comments)    Has patient had a PCN reaction causing immediate rash, facial/tongue/throat swelling, SOB or lightheadedness with hypotension: Yes Has patient had a PCN reaction causing severe rash involving mucus membranes or skin necrosis: No Has patient had a PCN reaction that required hospitalization: No Has patient had a PCN reaction occurring within the last 10 years: Yes If all of the above answers are "NO", then may proceed with Cephalosporin use.  Marland Kitchen Hydrochlorothiazide Rash   Prior to Admission medications   Medication Sig Start Date End Date Taking? Authorizing Provider  Albuterol Sulfate (PROAIR RESPICLICK) 482 (90 Base) MCG/ACT AEPB Inhale 2 puffs into the lungs every 4 (four) hours as needed (shortness of breath / wheezing).    Yes [provider]  ALPRAZolam Duanne Moron) 0.5 MG tablet Take 0.5 mg by mouth daily.   Yes [provider]  aspirin EC 81 MG tablet Take 81 mg by mouth daily.  Yes [provider]  blood glucose meter kit and supplies KIT Dispense based on patient and insurance preference. Use up to four times daily as directed. (FOR ICD-9 250.00, 250.01). 12/28/19  Yes Nicole Kindred A, DO  carvedilol (COREG) 12.5 MG tablet Take 12.5 mg by mouth 2 (two) times daily.  01/31/19  Yes [provider]  cetirizine (ZYRTEC) 10 MG tablet Take 10 mg by mouth daily.   Yes [provider]  digoxin (LANOXIN) 0.125 MG tablet Take 0.125 mg by mouth daily.   Yes [provider]  fluticasone (FLONASE) 50 MCG/ACT nasal  spray Place 2 sprays into both nostrils daily.   Yes [provider]  insulin aspart (NOVOLOG) 100 UNIT/ML FlexPen Inject 6 Units into the skin 3 (three) times daily with meals. 12/28/19  Yes Nicole Kindred A, DO  Insulin Syringe-Needle U-100 (INSULIN SYRINGE .5CC/28G) 28G X 1/2" 0.5 ML MISC 1 each by Does not apply route 2 (two) times daily with a meal. Fill one syringe with 0.25 mL (25 units) of insulin twice daily with meals (breakfast and dinner). 12/28/19  Yes Ezekiel Slocumb, DO  levothyroxine (SYNTHROID) 137 MCG tablet Take 137 mcg by mouth daily.  03/05/14  Yes [provider]  Liniments (SALONPAS PAIN RELIEF PATCH EX) Apply 1 patch topically daily as needed (pain).   Yes [provider]  metFORMIN (GLUCOPHAGE-XR) 500 MG 24 hr tablet 500 mg. 02/01/20  Yes [provider]  potassium chloride SA (K-DUR,KLOR-CON) 20 MEQ tablet Take 20 mEq by mouth 2 (two) times daily.  04/29/15  Yes [provider]  sacubitril-valsartan (ENTRESTO) 24-26 MG Take 1 tablet by mouth 2 (two) times daily. 01/06/20  Yes Darylene Price A, FNP  spironolactone (ALDACTONE) 25 MG tablet Take 0.5 tablets (12.5 mg total) by mouth daily. Patient taking differently: Take 25 mg by mouth daily. 09/06/20 12/05/20 Yes Verla Bryngelson, Otila Kluver A, FNP  torsemide (DEMADEX) 100 MG tablet Take 0.5 tablets (50 mg total) by mouth daily. 01/02/20  Yes Javelle Donigan, Otila Kluver A, FNP  ondansetron (ZOFRAN) 8 MG tablet Take 1 tablet (8 mg total) by mouth every 8 (eight) hours as needed for nausea or vomiting. Patient not taking: Reported on 09/20/2020 12/28/19 12/27/20  Ezekiel Slocumb, DO    Review of Systems  Constitutional: Negative for appetite change and fatigue.  HENT: Negative for congestion, postnasal drip and sore throat.   Eyes: Positive for discharge (eyes with clear drainage).  Respiratory: Positive for shortness of breath. Negative for cough and chest tightness.   Cardiovascular: Negative for chest pain,  palpitations and leg swelling.  Gastrointestinal: Positive for abdominal distention and nausea. Negative for abdominal pain.  Endocrine: Negative.   Genitourinary: Negative.   Musculoskeletal: Positive for back pain. Negative for neck pain.  Skin: Negative.   Allergic/Immunologic: Negative.   Neurological: Positive for weakness (legs), light-headedness and headaches. Negative for dizziness and numbness.  Hematological: Negative for adenopathy. Does not bruise/bleed easily.  Psychiatric/Behavioral: Positive for sleep disturbance (sleeping on 3 pillows). Negative for dysphoric mood. The patient is not nervous/anxious.    Vitals:   09/20/20 1537  BP: 128/83  Pulse: 85  Resp: 18  SpO2: 97%  Weight: 161 lb (73 kg)  Height: 5' 1"  (1.549 m)   Wt Readings from Last 3 Encounters:  09/20/20 161 lb (73 kg)  09/06/20 162 lb (73.5 kg)  06/26/20 160 lb (72.6 kg)   Lab Results  Component Value Date   CREATININE 1.21 (H) 06/26/2020   CREATININE 1.55 (  H) 03/09/2020   CREATININE 1.38 (H) 01/28/2020    Physical Exam Vitals and nursing note reviewed.  Constitutional:      Appearance: She is well-developed.  HENT:     Head: Normocephalic and atraumatic.  Neck:     Vascular: No JVD.  Cardiovascular:     Rate and Rhythm: Normal rate and regular rhythm.  Pulmonary:     Effort: Pulmonary effort is normal. No respiratory distress.     Breath sounds: No wheezing or rales.  Abdominal:     General: There is no distension.     Tenderness: There is no abdominal tenderness.  Musculoskeletal:     Cervical back: Normal range of motion and neck supple.     Right lower leg: No tenderness. No edema.     Left lower leg: No tenderness. No edema.  Skin:    General: Skin is warm and dry.  Neurological:     General: No focal deficit present.     Mental Status: She is alert and oriented to person, place, and time.  Psychiatric:        Mood and Affect: Mood normal.        Behavior: Behavior normal.     Assessment & Plan:  1: Chronic heart failure with reduced ejection fraction- - NYHA class II - euvolemic today - weighing daily and she was reminded to call for an overnight weight gain of >2 pounds or a weekly weight gain of >5 pounds - weight 1stable from last visit here 3 weeks ago - not adding salt to her food and has been trying to read food labels for sodium content - saw cardiology Margarito Courser) 08/30/20 - check BMP today as spironolactone was added at last visit - will also check magnesium due to hand cramping - could add farxiga in the future  - BNP 06/26/20 was 280.5 - has received both Moderna covid vaccine  2: HTN- - BP looks good today - saw PCP Baldemar Lenis) 05/31/20; explained that she would need to contact him for anti-nausea medication - BMP 06/26/20 reviewed and showed sodium 139, potassium 3.8, creatinine 1.21 and GFR 57  3: DM- - A1c 05/14/20 was 5.4% - nonfasting glucose at home this morning was 136  - saw vascular (Schnier) 02/09/20 - will check A1c today  4: Tobacco use- - smoking 5-6 cigarettes daily - complete cessation discussed for 3 minutes with her   Patient did not bring her medications nor a list. Each medication was verbally reviewed with the patient and she was encouraged to bring the bottles to every visit to confirm accuracy of list.  Return in 1 month or sooner for any questions/problems before then.

## 2020-09-20 ENCOUNTER — Ambulatory Visit: Payer: BC Managed Care – PPO | Attending: Family | Admitting: Family

## 2020-09-20 ENCOUNTER — Other Ambulatory Visit
Admission: RE | Admit: 2020-09-20 | Discharge: 2020-09-20 | Disposition: A | Discharge status: Home / Self Care | Payer: BC Managed Care – PPO | Source: Ambulatory Visit | Attending: Family | Admitting: Family

## 2020-09-20 ENCOUNTER — Encounter: Payer: Self-pay | Admitting: Family

## 2020-09-20 ENCOUNTER — Other Ambulatory Visit: Payer: Self-pay

## 2020-09-20 VITALS — BP 128/83 | HR 85 | Resp 18 | Ht 61.0 in | Wt 161.0 lb

## 2020-09-20 DIAGNOSIS — M19042 Primary osteoarthritis, left hand: Secondary | ICD-10-CM | POA: Insufficient documentation

## 2020-09-20 DIAGNOSIS — Z8249 Family history of ischemic heart disease and other diseases of the circulatory system: Secondary | ICD-10-CM | POA: Diagnosis not present

## 2020-09-20 DIAGNOSIS — Z880 Allergy status to penicillin: Secondary | ICD-10-CM | POA: Diagnosis not present

## 2020-09-20 DIAGNOSIS — Z95810 Presence of automatic (implantable) cardiac defibrillator: Secondary | ICD-10-CM | POA: Insufficient documentation

## 2020-09-20 DIAGNOSIS — J45909 Unspecified asthma, uncomplicated: Secondary | ICD-10-CM | POA: Insufficient documentation

## 2020-09-20 DIAGNOSIS — E079 Disorder of thyroid, unspecified: Secondary | ICD-10-CM | POA: Diagnosis not present

## 2020-09-20 DIAGNOSIS — E119 Type 2 diabetes mellitus without complications: Secondary | ICD-10-CM | POA: Diagnosis not present

## 2020-09-20 DIAGNOSIS — Z794 Long term (current) use of insulin: Secondary | ICD-10-CM | POA: Insufficient documentation

## 2020-09-20 DIAGNOSIS — Z79890 Hormone replacement therapy (postmenopausal): Secondary | ICD-10-CM | POA: Insufficient documentation

## 2020-09-20 DIAGNOSIS — Z7982 Long term (current) use of aspirin: Secondary | ICD-10-CM | POA: Insufficient documentation

## 2020-09-20 DIAGNOSIS — Z881 Allergy status to other antibiotic agents status: Secondary | ICD-10-CM | POA: Insufficient documentation

## 2020-09-20 DIAGNOSIS — Z7984 Long term (current) use of oral hypoglycemic drugs: Secondary | ICD-10-CM | POA: Insufficient documentation

## 2020-09-20 DIAGNOSIS — I10 Essential (primary) hypertension: Secondary | ICD-10-CM

## 2020-09-20 DIAGNOSIS — Z720 Tobacco use: Secondary | ICD-10-CM

## 2020-09-20 DIAGNOSIS — I110 Hypertensive heart disease with heart failure: Secondary | ICD-10-CM | POA: Insufficient documentation

## 2020-09-20 DIAGNOSIS — Z9049 Acquired absence of other specified parts of digestive tract: Secondary | ICD-10-CM | POA: Insufficient documentation

## 2020-09-20 DIAGNOSIS — M19041 Primary osteoarthritis, right hand: Secondary | ICD-10-CM | POA: Diagnosis not present

## 2020-09-20 DIAGNOSIS — R110 Nausea: Secondary | ICD-10-CM | POA: Diagnosis not present

## 2020-09-20 DIAGNOSIS — Z79899 Other long term (current) drug therapy: Secondary | ICD-10-CM | POA: Insufficient documentation

## 2020-09-20 DIAGNOSIS — F17210 Nicotine dependence, cigarettes, uncomplicated: Secondary | ICD-10-CM | POA: Diagnosis not present

## 2020-09-20 DIAGNOSIS — Z888 Allergy status to other drugs, medicaments and biological substances status: Secondary | ICD-10-CM | POA: Insufficient documentation

## 2020-09-20 DIAGNOSIS — I5022 Chronic systolic (congestive) heart failure: Secondary | ICD-10-CM | POA: Diagnosis not present

## 2020-09-20 DIAGNOSIS — J449 Chronic obstructive pulmonary disease, unspecified: Secondary | ICD-10-CM | POA: Insufficient documentation

## 2020-09-20 DIAGNOSIS — G510 Bell's palsy: Secondary | ICD-10-CM | POA: Diagnosis not present

## 2020-09-20 LAB — BASIC METABOLIC PANEL
Anion gap: 10 (ref 5–15)
BUN: 15 mg/dL (ref 6–20)
CO2: 27 mmol/L (ref 22–32)
Calcium: 9.3 mg/dL (ref 8.9–10.3)
Chloride: 103 mmol/L (ref 98–111)
Creatinine, Ser: 1.12 mg/dL — ABNORMAL HIGH (ref 0.44–1.00)
GFR, Estimated: 57 mL/min — ABNORMAL LOW (ref >60)
Glucose, Bld: 115 mg/dL — ABNORMAL HIGH (ref 70–99)
Potassium: 3.8 mmol/L (ref 3.5–5.1)
Sodium: 140 mmol/L (ref 135–145)

## 2020-09-20 LAB — MAGNESIUM: Magnesium: 2 mg/dL (ref 1.7–2.4)

## 2020-09-20 NOTE — Patient Instructions (Signed)
Continue weighing daily and call for an overnight weight gain of > 2 pounds or a weekly weight gain of >5 pounds. 

## 2020-09-21 LAB — HEMOGLOBIN A1C
Hgb A1c MFr Bld: 5.4 % (ref 4.8–5.6)
Mean Plasma Glucose: 108 mg/dL

## 2020-10-22 NOTE — Progress Notes (Signed)
Patient ID: Heather Choi, female    DOB: December 27, 1960, 60 y.o.   MRN: [id number]  HPI  [name] is a 60 y/o female with a history of asthma, DM, HTN, thyroid disease, bell's palsy, COPD, current tobacco use and chronic heart failure.   Echo report from 01/22/2019 reviewed and showed an EF of 25-30% along with moderate AR.   Was in the ED 06/26/20 due to URI symptoms and covid exposure. CXR negative. COVID negative and she was released.    She presents today for a follow-up visit with a chief complaint of minimal shortness of breath upon moderate exertion. She describes this as chronic in nature having been present for several years. She has associated abdominal distention, intermittent nausea, headaches (in the morning), light-headedness, slight weight gain and difficulty sleeping along with this. She denies any palpitations, pedal edema, chest pain, cough or fatigue.   Says that she continues to feel bloated in her abdomen.   Past Medical History:  Diagnosis Date  . Asthma   . Bell's palsy    fingers tingling in cold weather  . CHF (congestive heart failure) ([place])   . COPD (chronic obstructive pulmonary disease) ([place])   . Diabetes mellitus without complication ([place])   . Enlarged heart   . Hypertension   . Thyroid disease    Past Surgical History:  Procedure Laterality Date  . ABDOMINAL HYSTERECTOMY    . CHOLECYSTECTOMY    . IMPLANTABLE CARDIOVERTER DEFIBRILLATOR IMPLANT    . laparoscopic bilateral oophorectomy with removal of adnexal mass and lysis of adhesions  11/20/2017  . LAPAROSCOPY     with removal of adnexal structure   Family History  Problem Relation Age of Onset  . Heart failure Father   . Heart attack Mother    Social History   Tobacco Use  . Smoking status: Current Every Day Smoker    Packs/day: 0.50    Types: Cigarettes  . Smokeless tobacco: Never Used  Substance Use Topics  . Alcohol use: Yes    Comment: rarely   Allergies  Allergen Reactions  .  Clindamycin/Lincomycin     Burning sensastion  . Amlodipine Other (See Comments) and Itching  . Penicillins Other (See Comments)    Has patient had a PCN reaction causing immediate rash, facial/tongue/throat swelling, SOB or lightheadedness with hypotension: Yes Has patient had a PCN reaction causing severe rash involving mucus membranes or skin necrosis: No Has patient had a PCN reaction that required hospitalization: No Has patient had a PCN reaction occurring within the last 10 years: Yes If all of the above answers are "NO", then may proceed with Cephalosporin use.  Marland Kitchen Hydrochlorothiazide Rash   Prior to Admission medications   Medication Sig Start Date End Date Taking? Authorizing Provider  Albuterol Sulfate (PROAIR RESPICLICK) 342 (90 Base) MCG/ACT AEPB Inhale 2 puffs into the lungs every 4 (four) hours as needed (shortness of breath / wheezing).    Yes [provider]  ALPRAZolam Duanne Moron) 0.5 MG tablet Take 0.5 mg by mouth daily.   Yes [provider]  aspirin EC 81 MG tablet Take 81 mg by mouth daily.    Yes [provider]  blood glucose meter kit and supplies KIT Dispense based on patient and insurance preference. Use up to four times daily as directed. (FOR ICD-9 250.00, 250.01). 12/28/19  Yes Nicole Kindred A, DO  carvedilol (COREG) 12.5 MG tablet Take 12.5 mg by mouth 2 (two) times daily.  01/31/19  Yes  [provider]  cetirizine (ZYRTEC) 10 MG tablet Take 10 mg by mouth daily.   Yes [provider]  digoxin (LANOXIN) 0.125 MG tablet Take 0.125 mg by mouth daily.   Yes [provider]  fluticasone (FLONASE) 50 MCG/ACT nasal spray Place 2 sprays into both nostrils daily.   Yes [provider]  insulin aspart (NOVOLOG) 100 UNIT/ML FlexPen Inject 6 Units into the skin 3 (three) times daily with meals. 12/28/19  Yes Nicole Kindred A, DO  Insulin Syringe-Needle U-100 (INSULIN SYRINGE .5CC/28G) 28G X 1/2" 0.5 ML MISC 1 each by  Does not apply route 2 (two) times daily with a meal. Fill one syringe with 0.25 mL (25 units) of insulin twice daily with meals (breakfast and dinner). 12/28/19  Yes Ezekiel Slocumb, DO  levothyroxine (SYNTHROID) 137 MCG tablet Take 137 mcg by mouth daily.  03/05/14  Yes [provider]  Liniments (SALONPAS PAIN RELIEF PATCH EX) Apply 1 patch topically daily as needed (pain).   Yes [provider]  metFORMIN (GLUCOPHAGE-XR) 500 MG 24 hr tablet 500 mg. 02/01/20  Yes [provider]  ondansetron (ZOFRAN) 8 MG tablet Take 1 tablet (8 mg total) by mouth every 8 (eight) hours as needed for nausea or vomiting. 12/28/19 12/27/20 Yes Nicole Kindred A, DO  potassium chloride SA (K-DUR,KLOR-CON) 20 MEQ tablet Take 20 mEq by mouth 2 (two) times daily.  04/29/15  Yes [provider]  sacubitril-valsartan (ENTRESTO) 24-26 MG Take 1 tablet by mouth 2 (two) times daily. 01/06/20  Yes Darylene Price A, FNP  spironolactone (ALDACTONE) 25 MG tablet Take 0.5 tablets (12.5 mg total) by mouth daily. Patient taking differently: Take 25 mg by mouth daily. 09/06/20 12/05/20 Yes Hardie Veltre, Otila Kluver A, FNP  torsemide (DEMADEX) 100 MG tablet Take 0.5 tablets (50 mg total) by mouth daily. 01/02/20  Yes Alisa Graff, FNP     Review of Systems  Constitutional: Negative for appetite change and fatigue.  HENT: Negative for congestion, postnasal drip and sore throat.   Eyes: Negative.   Respiratory: Positive for shortness of breath. Negative for cough and chest tightness.   Cardiovascular: Negative for chest pain, palpitations and leg swelling.  Gastrointestinal: Positive for abdominal distention and nausea. Negative for abdominal pain.  Endocrine: Negative.   Genitourinary: Negative.   Musculoskeletal: Positive for back pain. Negative for neck pain.  Skin: Negative.   Allergic/Immunologic: Negative.   Neurological: Positive for weakness (legs), light-headedness and headaches. Negative for dizziness and  numbness.  Hematological: Negative for adenopathy. Does not bruise/bleed easily.  Psychiatric/Behavioral: Positive for sleep disturbance (sleeping on 3 pillows). Negative for dysphoric mood. The patient is not nervous/anxious.    Vitals:   10/25/20 1001  BP: (!) 144/88  Pulse: 84  Resp: 18  SpO2: 98%  Weight: 164 lb 4 oz (74.5 kg)  Height: 5' 1"  (1.549 m)   Wt Readings from Last 3 Encounters:  10/25/20 164 lb 4 oz (74.5 kg)  09/20/20 161 lb (73 kg)  09/06/20 162 lb (73.5 kg)   Lab Results  Component Value Date   CREATININE 1.12 (H) 09/20/2020   CREATININE 1.21 (H) 06/26/2020   CREATININE 1.55 (H) 03/09/2020    Physical Exam Vitals and nursing note reviewed.  Constitutional:      Appearance: She is well-developed.  HENT:     Head: Normocephalic and atraumatic.  Neck:     Vascular: No JVD.  Cardiovascular:     Rate and Rhythm: Normal rate and regular rhythm.  Pulmonary:     Effort: Pulmonary effort is normal. No respiratory distress.     Breath sounds: No wheezing or rales.  Abdominal:     General: There is no distension.     Tenderness: There is no abdominal tenderness.  Musculoskeletal:     Cervical back: Normal range of motion and neck supple.     Right lower leg: No tenderness. No edema.     Left lower leg: No tenderness. No edema.  Skin:    General: Skin is warm and dry.  Neurological:     General: No focal deficit present.     Mental Status: She is alert and oriented to person, place, and time.  Psychiatric:        Mood and Affect: Mood normal.        Behavior: Behavior normal.    Assessment & Plan:  1: Chronic heart failure with reduced ejection fraction- - NYHA class II - euvolemic today - weighing daily and she was reminded to call for an overnight weight gain of >2 pounds or a weekly weight gain of >5 pounds - weight up 3 pounds from last visit here 1 month ago - not adding salt to her food and has been trying to read food labels for sodium  content - saw cardiology Margarito Courser) 08/30/20 - will add farxiga 56m daily; 30 day voucher given  - check BMP next visit - discussed titrating up entresto/ carvedilol next and then repeating echo in a few months - BNP 06/26/20 was 280.5 - has received both Moderna covid vaccine  2: HTN- - BP mildly elevated - saw PCP (Baldemar Lenis 05/31/20 & she thinks she returns in a few months; she may need to contact him sooner if nausea/ abdominal bloating continues - BMP 09/20/20 reviewed and showed sodium 140, potassium 3.8, creatinine 1.12 and GFR 57  3: DM- - A1c 09/20/20 was 5.4% - nonfasting glucose at home this morning was 117 - saw vascular (Schnier) 02/09/20  4: Tobacco use- - smoking 5-6 cigarettes daily - complete cessation discussed for 3 minutes with her   Patient did not bring her medications nor a list. Each medication was verbally reviewed with the patient and she was encouraged to bring the bottles to every visit to confirm accuracy of list.  Return in 1 month or sooner for any questions/problems before then.

## 2020-10-25 ENCOUNTER — Ambulatory Visit: Payer: BC Managed Care – PPO | Attending: Family | Admitting: Family

## 2020-10-25 ENCOUNTER — Other Ambulatory Visit: Payer: Self-pay

## 2020-10-25 ENCOUNTER — Encounter: Payer: Self-pay | Admitting: Family

## 2020-10-25 VITALS — BP 144/88 | HR 84 | Resp 18 | Ht 61.0 in | Wt 164.2 lb

## 2020-10-25 DIAGNOSIS — Z79899 Other long term (current) drug therapy: Secondary | ICD-10-CM | POA: Diagnosis not present

## 2020-10-25 DIAGNOSIS — Z7982 Long term (current) use of aspirin: Secondary | ICD-10-CM | POA: Diagnosis not present

## 2020-10-25 DIAGNOSIS — Z9049 Acquired absence of other specified parts of digestive tract: Secondary | ICD-10-CM | POA: Diagnosis not present

## 2020-10-25 DIAGNOSIS — Z716 Tobacco abuse counseling: Secondary | ICD-10-CM | POA: Diagnosis not present

## 2020-10-25 DIAGNOSIS — E119 Type 2 diabetes mellitus without complications: Secondary | ICD-10-CM

## 2020-10-25 DIAGNOSIS — Z888 Allergy status to other drugs, medicaments and biological substances status: Secondary | ICD-10-CM | POA: Insufficient documentation

## 2020-10-25 DIAGNOSIS — Z7984 Long term (current) use of oral hypoglycemic drugs: Secondary | ICD-10-CM | POA: Insufficient documentation

## 2020-10-25 DIAGNOSIS — Z95810 Presence of automatic (implantable) cardiac defibrillator: Secondary | ICD-10-CM | POA: Insufficient documentation

## 2020-10-25 DIAGNOSIS — I110 Hypertensive heart disease with heart failure: Secondary | ICD-10-CM | POA: Insufficient documentation

## 2020-10-25 DIAGNOSIS — J449 Chronic obstructive pulmonary disease, unspecified: Secondary | ICD-10-CM | POA: Insufficient documentation

## 2020-10-25 DIAGNOSIS — Z8249 Family history of ischemic heart disease and other diseases of the circulatory system: Secondary | ICD-10-CM | POA: Diagnosis not present

## 2020-10-25 DIAGNOSIS — I10 Essential (primary) hypertension: Secondary | ICD-10-CM

## 2020-10-25 DIAGNOSIS — I5022 Chronic systolic (congestive) heart failure: Secondary | ICD-10-CM | POA: Diagnosis not present

## 2020-10-25 DIAGNOSIS — Z79890 Hormone replacement therapy (postmenopausal): Secondary | ICD-10-CM | POA: Diagnosis not present

## 2020-10-25 DIAGNOSIS — Z881 Allergy status to other antibiotic agents status: Secondary | ICD-10-CM | POA: Insufficient documentation

## 2020-10-25 DIAGNOSIS — E079 Disorder of thyroid, unspecified: Secondary | ICD-10-CM | POA: Insufficient documentation

## 2020-10-25 DIAGNOSIS — Z8669 Personal history of other diseases of the nervous system and sense organs: Secondary | ICD-10-CM | POA: Insufficient documentation

## 2020-10-25 DIAGNOSIS — Z720 Tobacco use: Secondary | ICD-10-CM

## 2020-10-25 DIAGNOSIS — Z880 Allergy status to penicillin: Secondary | ICD-10-CM | POA: Insufficient documentation

## 2020-10-25 DIAGNOSIS — Z794 Long term (current) use of insulin: Secondary | ICD-10-CM | POA: Diagnosis not present

## 2020-10-25 DIAGNOSIS — F17210 Nicotine dependence, cigarettes, uncomplicated: Secondary | ICD-10-CM | POA: Diagnosis not present

## 2020-10-25 MED ORDER — DAPAGLIFLOZIN PROPANEDIOL 10 MG PO TABS: 10.0000 mg | ORAL_TABLET | Freq: Every day | ORAL | 5 refills | Status: DC

## 2020-10-25 NOTE — Patient Instructions (Signed)
Continue weighing daily and call for an overnight weight gain of > 2 pounds or a weekly weight gain of >5 pounds. 

## 2020-11-12 ENCOUNTER — Other Ambulatory Visit: Payer: Self-pay | Admitting: Family

## 2020-11-19 NOTE — Progress Notes (Signed)
Patient ID: Heather Choi, female    DOB: 01/12/1961, 60 y.o.   MRN: [id number]  HPI  [name] is a 60 y/o female with a history of asthma, DM, HTN, thyroid disease, bell's palsy, COPD, current tobacco use and chronic heart failure.   Echo report from 01/22/2019 reviewed and showed an EF of 25-30% along with moderate AR.   Was in the ED 06/26/20 due to URI symptoms and covid exposure. CXR negative. COVID negative and she was released.    She presents today for a follow-up visit with a chief complaint of minimal shortness of breath upon moderate exertion. She describes this as chronic in nature having been present for several years. She has associated headaches, light-headedness, leg weakness, abdominal distention, nausea, bloating and difficulty sleeping along with this. She denies any palpitations, pedal edema, chest pain, cough, fatigue or weight gain.   She says that her nausea has worsened since she's been taking farxiga. She says that she's tried taking it when she eats, doesn't eat as well as different times of the day and the nausea has continued.   Past Medical History:  Diagnosis Date  . Asthma   . Bell's palsy    fingers tingling in cold weather  . CHF (congestive heart failure) ([place])   . COPD (chronic obstructive pulmonary disease) ([place])   . Diabetes mellitus without complication ([place])   . Enlarged heart   . Hypertension   . Thyroid disease    Past Surgical History:  Procedure Laterality Date  . ABDOMINAL HYSTERECTOMY    . CHOLECYSTECTOMY    . IMPLANTABLE CARDIOVERTER DEFIBRILLATOR IMPLANT    . laparoscopic bilateral oophorectomy with removal of adnexal mass and lysis of adhesions  11/20/2017  . LAPAROSCOPY     with removal of adnexal structure   Family History  Problem Relation Age of Onset  . Heart failure Father   . Heart attack Mother    Social History   Tobacco Use  . Smoking status: Current Every Day Smoker    Packs/day: 0.50    Types: Cigarettes  .  Smokeless tobacco: Never Used  Substance Use Topics  . Alcohol use: Yes    Comment: rarely   Allergies  Allergen Reactions  . Clindamycin/Lincomycin     Burning sensastion  . Amlodipine Other (See Comments) and Itching  . Penicillins Other (See Comments)    Has patient had a PCN reaction causing immediate rash, facial/tongue/throat swelling, SOB or lightheadedness with hypotension: Yes Has patient had a PCN reaction causing severe rash involving mucus membranes or skin necrosis: No Has patient had a PCN reaction that required hospitalization: No Has patient had a PCN reaction occurring within the last 10 years: Yes If all of the above answers are "NO", then may proceed with Cephalosporin use.  Marland Kitchen Hydrochlorothiazide Rash   Prior to Admission medications   Medication Sig Start Date End Date Taking? Authorizing Provider  ALPRAZolam Duanne Moron) 0.5 MG tablet Take 0.5 mg by mouth daily.   Yes [provider]  aspirin EC 81 MG tablet Take 81 mg by mouth daily.    Yes [provider]  blood glucose meter kit and supplies KIT Dispense based on patient and insurance preference. Use up to four times daily as directed. (FOR ICD-9 250.00, 250.01). 12/28/19  Yes Nicole Kindred A, DO  carvedilol (COREG) 12.5 MG tablet Take 12.5 mg by mouth 2 (two) times daily.  01/31/19  Yes [provider]  cetirizine (ZYRTEC) 10 MG tablet  Take 10 mg by mouth daily.   Yes [provider]  dapagliflozin propanediol (FARXIGA) 10 MG TABS tablet Take 1 tablet (10 mg total) by mouth daily before breakfast. 10/25/20  Yes Darylene Price A, FNP  digoxin (LANOXIN) 0.125 MG tablet Take 0.125 mg by mouth daily.   Yes [provider]  insulin aspart (NOVOLOG) 100 UNIT/ML FlexPen Inject 6 Units into the skin 3 (three) times daily with meals. 12/28/19  Yes Nicole Kindred A, DO  Insulin Syringe-Needle U-100 (INSULIN SYRINGE .5CC/28G) 28G X 1/2" 0.5 ML MISC 1 each by Does not apply route 2 (two)  times daily with a meal. Fill one syringe with 0.25 mL (25 units) of insulin twice daily with meals (breakfast and dinner). 12/28/19  Yes Ezekiel Slocumb, DO  levothyroxine (SYNTHROID) 137 MCG tablet Take 137 mcg by mouth daily. 03/05/14  Yes [provider]  Liniments (SALONPAS PAIN RELIEF PATCH EX) Apply 1 patch topically daily as needed (pain).   Yes [provider]  metFORMIN (GLUCOPHAGE-XR) 500 MG 24 hr tablet 500 mg. 02/01/20  Yes [provider]  ondansetron (ZOFRAN) 8 MG tablet Take 1 tablet (8 mg total) by mouth every 8 (eight) hours as needed for nausea or vomiting. 12/28/19 12/27/20 Yes Nicole Kindred A, DO  potassium chloride SA (K-DUR,KLOR-CON) 20 MEQ tablet Take 20 mEq by mouth 2 (two) times daily.  04/29/15  Yes [provider]  sacubitril-valsartan (ENTRESTO) 24-26 MG Take 1 tablet by mouth 2 (two) times daily. 01/06/20  Yes Darylene Price A, FNP  spironolactone (ALDACTONE) 25 MG tablet Take 0.5 tablets (12.5 mg total) by mouth daily. Patient taking differently: Take 25 mg by mouth daily. 09/06/20 12/05/20 Yes Darylene Price A, FNP  torsemide (DEMADEX) 100 MG tablet TAKE 1/2 (ONE-HALF) TABLET BY MOUTH ONCE DAILY (DISCONTINUE  FUROSEMIDE) 11/12/20  Yes Alisa Graff, FNP     Review of Systems  Constitutional: Negative for appetite change and fatigue.  HENT: Negative for congestion, postnasal drip and sore throat.   Eyes: Negative.   Respiratory: Positive for shortness of breath. Negative for cough and chest tightness.   Cardiovascular: Negative for chest pain, palpitations and leg swelling.  Gastrointestinal: Positive for abdominal distention and nausea (worse since taking farxiga). Negative for abdominal pain.  Endocrine: Negative.   Genitourinary: Negative.   Musculoskeletal: Positive for back pain. Negative for neck pain.  Skin: Negative.   Allergic/Immunologic: Negative.   Neurological: Positive for weakness (legs), light-headedness and headaches  (especially when waking up in morning). Negative for dizziness and numbness.  Hematological: Negative for adenopathy. Does not bruise/bleed easily.  Psychiatric/Behavioral: Positive for sleep disturbance (sleeping on 3 pillows). Negative for dysphoric mood. The patient is not nervous/anxious.    Vitals:   11/22/20 1008  BP: 112/84  Pulse: 86  Resp: 18  SpO2: 99%  Weight: 157 lb 4 oz (71.3 kg)  Height: 5' 2"  (1.575 m)   Wt Readings from Last 3 Encounters:  11/22/20 157 lb 4 oz (71.3 kg)  10/25/20 164 lb 4 oz (74.5 kg)  09/20/20 161 lb (73 kg)   Lab Results  Component Value Date   CREATININE 1.12 (H) 09/20/2020   CREATININE 1.21 (H) 06/26/2020   CREATININE 1.55 (H) 03/09/2020    Physical Exam Vitals and nursing note reviewed.  Constitutional:      Appearance: She is well-developed.  HENT:     Head: Normocephalic and atraumatic.  Neck:     Vascular: No JVD.  Cardiovascular:  Rate and Rhythm: Normal rate and regular rhythm.  Pulmonary:     Effort: Pulmonary effort is normal. No respiratory distress.     Breath sounds: No wheezing or rales.  Abdominal:     General: There is no distension.     Tenderness: There is no abdominal tenderness.  Musculoskeletal:     Cervical back: Normal range of motion and neck supple.     Right lower leg: No tenderness. No edema.     Left lower leg: No tenderness. No edema.  Skin:    General: Skin is warm and dry.  Neurological:     General: No focal deficit present.     Mental Status: She is alert and oriented to person, place, and time.  Psychiatric:        Mood and Affect: Mood normal.        Behavior: Behavior normal.    Assessment & Plan:  1: Chronic heart failure with reduced ejection fraction- - NYHA class II - euvolemic today - weighing daily and she was reminded to call for an overnight weight gain of >2 pounds or a weekly weight gain of >5 pounds - weight down 7 pounds from last visit here 1 month ago - not adding salt  to her food and has been trying to read food labels for sodium content - saw cardiology Margarito Courser) 08/30/20 - farxiga started at last visit and she says that her nausea as worsened no matter when or how she takes the medication - advised patient to stop the farxiga, wait 3-4 days and then begin jardiance 53m daily; 14 day jardiance voucher given to patient - will get BMP at next visit - discussed titrating up entresto/ carvedilol next and then repeating echo in a few months - BNP 06/26/20 was 280.5 - has received both Moderna covid vaccine  2: HTN- - BP looks good today - saw PCP (Baldemar Lenis 05/31/20 & she thinks she returns in a few months - BMP 09/20/20 reviewed and showed sodium 140, potassium 3.8, creatinine 1.12 and GFR 57  3: DM- - A1c 09/20/20 was 5.4% - nonfasting glucose at home this morning was 119 - saw vascular (Schnier) 02/09/20  4: Tobacco use- - smoking 1/2 ppd cigarettes daily - complete cessation discussed for 3 minutes with her  5: Nausea- - has had nausea for months although has worsened with farxiga; stopping farxiga per above & will try jardiance - has lost 7 pounds since last visit here and continues to complain of feeling bloated - emphasized that she should f/u with PCP as she may benefit from GI referral for her chronic nausea/ bloating - she says that she thinks she sees her PCP in April but explained that she should contact them to see about getting a sooner appointment.    Patient did not bring her medications nor a list. Each medication was verbally reviewed with the patient and she was encouraged to bring the bottles to every visit to confirm accuracy of list.  Return in 2 weeks or sooner for any questions/problems before then.

## 2020-11-22 ENCOUNTER — Other Ambulatory Visit: Payer: Self-pay

## 2020-11-22 ENCOUNTER — Ambulatory Visit: Payer: BC Managed Care – PPO | Attending: Family | Admitting: Family

## 2020-11-22 ENCOUNTER — Encounter: Payer: Self-pay | Admitting: Family

## 2020-11-22 VITALS — BP 112/84 | HR 86 | Resp 18 | Ht 62.0 in | Wt 157.2 lb

## 2020-11-22 DIAGNOSIS — Z7982 Long term (current) use of aspirin: Secondary | ICD-10-CM | POA: Insufficient documentation

## 2020-11-22 DIAGNOSIS — Z8249 Family history of ischemic heart disease and other diseases of the circulatory system: Secondary | ICD-10-CM | POA: Insufficient documentation

## 2020-11-22 DIAGNOSIS — Z881 Allergy status to other antibiotic agents status: Secondary | ICD-10-CM | POA: Insufficient documentation

## 2020-11-22 DIAGNOSIS — E119 Type 2 diabetes mellitus without complications: Secondary | ICD-10-CM | POA: Diagnosis not present

## 2020-11-22 DIAGNOSIS — R110 Nausea: Secondary | ICD-10-CM | POA: Insufficient documentation

## 2020-11-22 DIAGNOSIS — I5022 Chronic systolic (congestive) heart failure: Secondary | ICD-10-CM | POA: Insufficient documentation

## 2020-11-22 DIAGNOSIS — Z720 Tobacco use: Secondary | ICD-10-CM

## 2020-11-22 DIAGNOSIS — R140 Abdominal distension (gaseous): Secondary | ICD-10-CM | POA: Diagnosis not present

## 2020-11-22 DIAGNOSIS — Z888 Allergy status to other drugs, medicaments and biological substances status: Secondary | ICD-10-CM | POA: Diagnosis not present

## 2020-11-22 DIAGNOSIS — Z794 Long term (current) use of insulin: Secondary | ICD-10-CM | POA: Insufficient documentation

## 2020-11-22 DIAGNOSIS — R519 Headache, unspecified: Secondary | ICD-10-CM | POA: Insufficient documentation

## 2020-11-22 DIAGNOSIS — I10 Essential (primary) hypertension: Secondary | ICD-10-CM

## 2020-11-22 DIAGNOSIS — Z880 Allergy status to penicillin: Secondary | ICD-10-CM | POA: Insufficient documentation

## 2020-11-22 DIAGNOSIS — I110 Hypertensive heart disease with heart failure: Secondary | ICD-10-CM | POA: Diagnosis not present

## 2020-11-22 DIAGNOSIS — Z79899 Other long term (current) drug therapy: Secondary | ICD-10-CM | POA: Insufficient documentation

## 2020-11-22 DIAGNOSIS — F17210 Nicotine dependence, cigarettes, uncomplicated: Secondary | ICD-10-CM | POA: Insufficient documentation

## 2020-11-22 MED ORDER — EMPAGLIFLOZIN 10 MG PO TABS: 10.0000 mg | ORAL_TABLET | Freq: Every day | ORAL | 0 refills | Status: DC

## 2020-11-22 NOTE — Patient Instructions (Signed)
Continue weighing daily and call for an overnight weight gain of > 2 pounds or a weekly weight gain of >5 pounds. 

## 2020-12-06 ENCOUNTER — Ambulatory Visit: Payer: BC Managed Care – PPO | Admitting: Family

## 2021-06-10 IMAGING — CT CT ABD-PELV W/O CM
2 of 4 series · 16 of 46 positions shown, 18 images · non-contrast
Comparison: CT abdomen pelvis dated 05/10/2019.

CLINICAL DATA: 58-year-old female with abdominal distension.

EXAM:
CT ABDOMEN AND PELVIS WITHOUT CONTRAST
TECHNIQUE: Multidetector CT imaging of the abdomen and pelvis was performed
following the standard protocol without IV contrast.

[Series 3: axial st · axial · 0.66mm/px · z∈[-526,-111]mm · 13 of 91 slices shown, 15 images]
[im 4/91  soft-tissue]
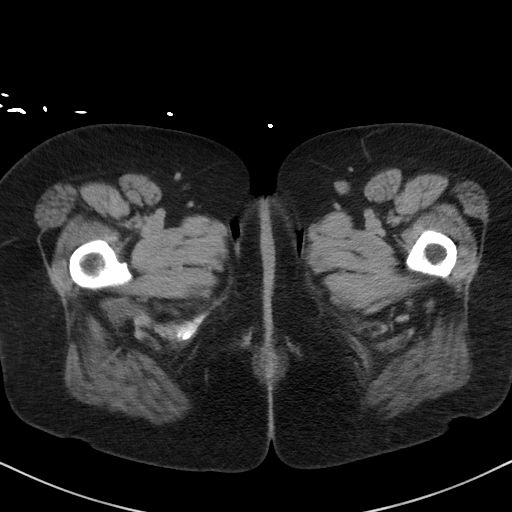
[im 4/91  bone]
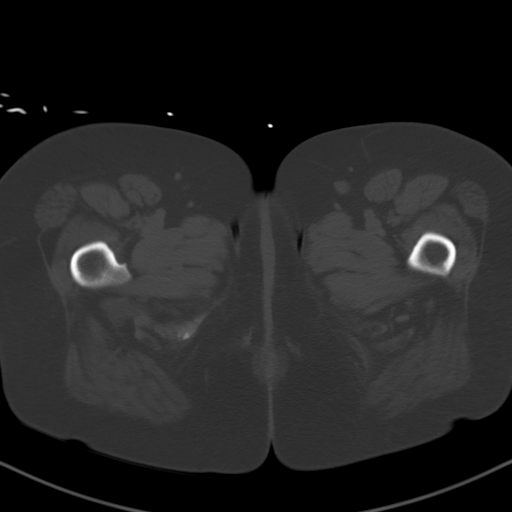
[im 12/91  soft-tissue]
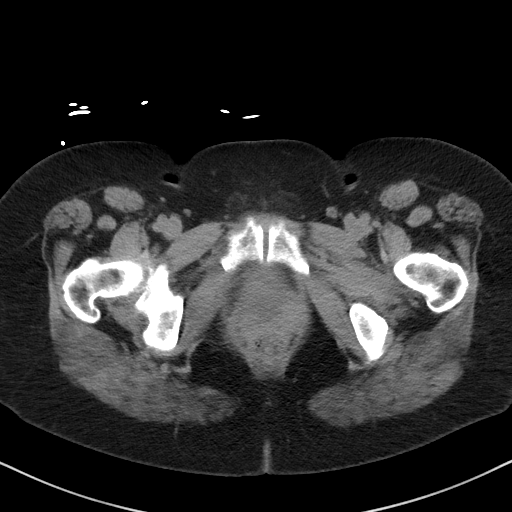
[im 19/91  soft-tissue]
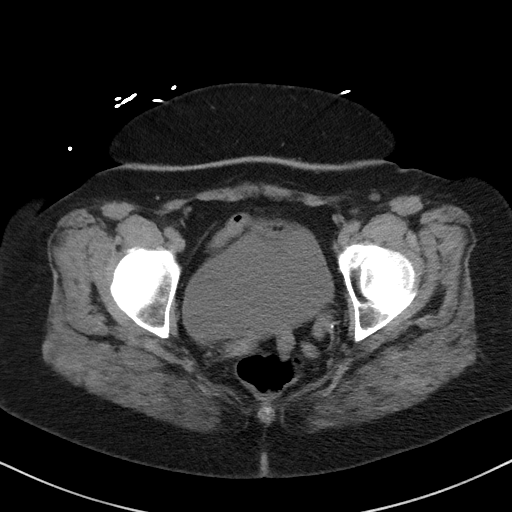
[im 27/91  soft-tissue]
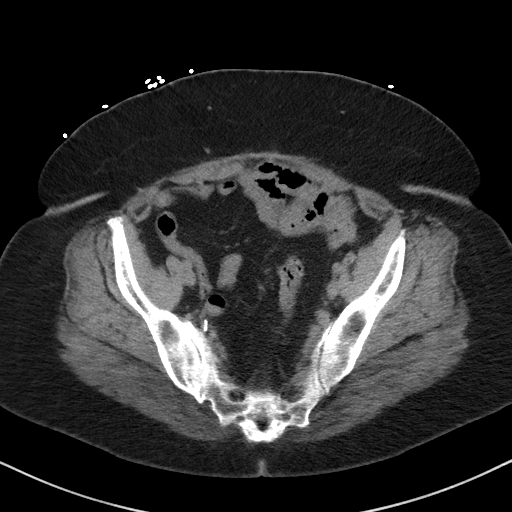
[im 31/91  soft-tissue]
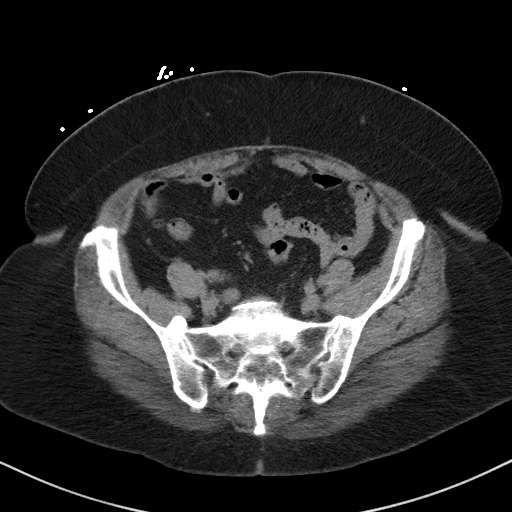
[im 38/91  soft-tissue]
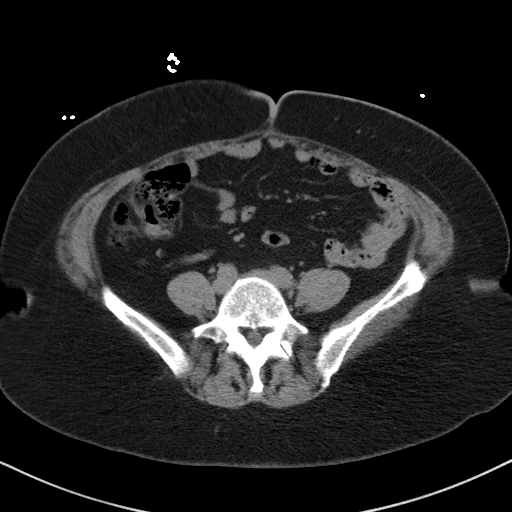
[im 46/91  soft-tissue]
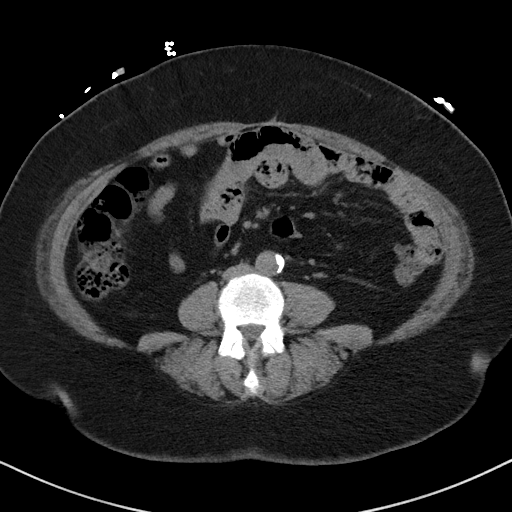
[im 53/91  soft-tissue]
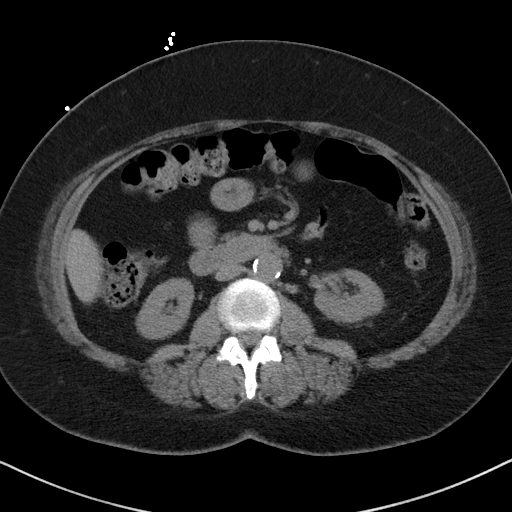
[im 61/91  soft-tissue]
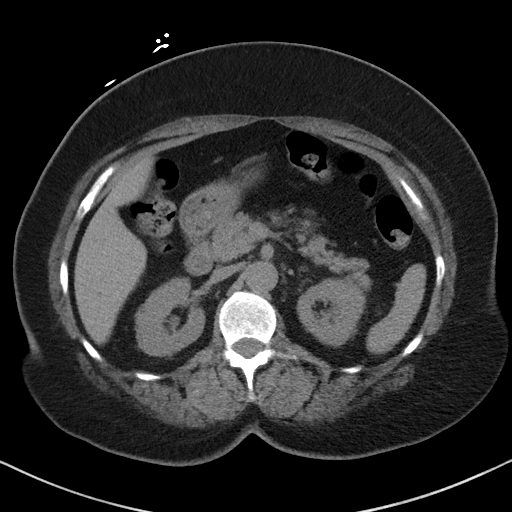
[im 61/91  bone]
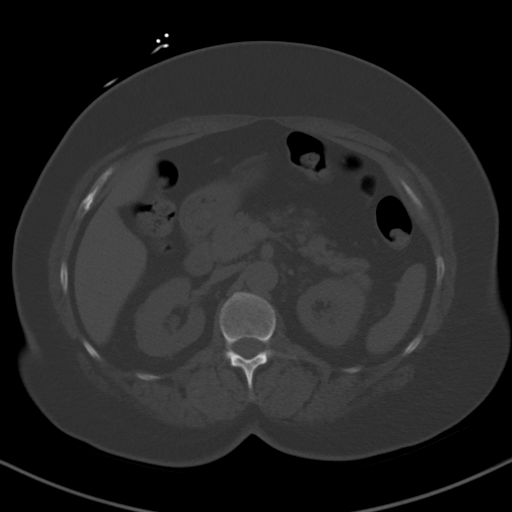
[im 64/91  soft-tissue]
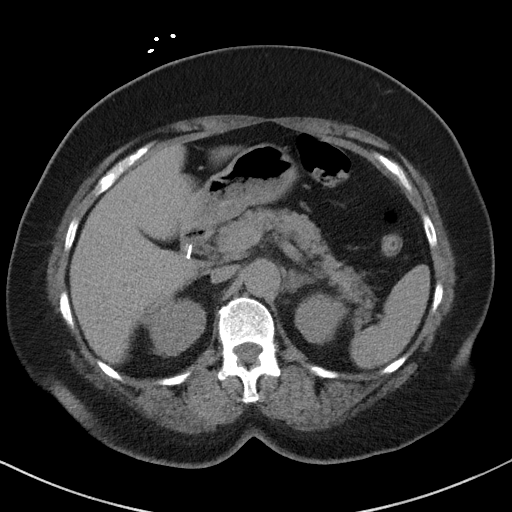
[im 72/91  soft-tissue]
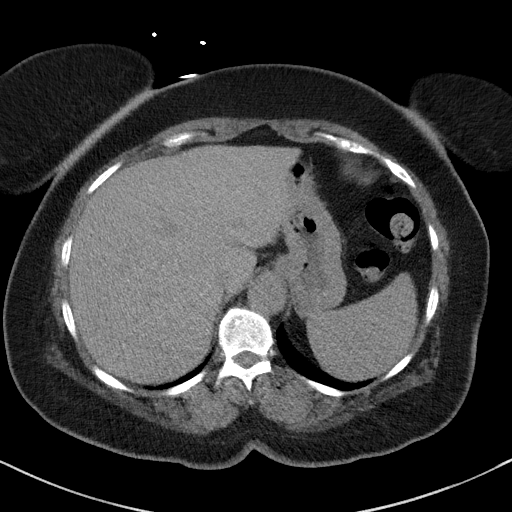
[im 79/91  soft-tissue]
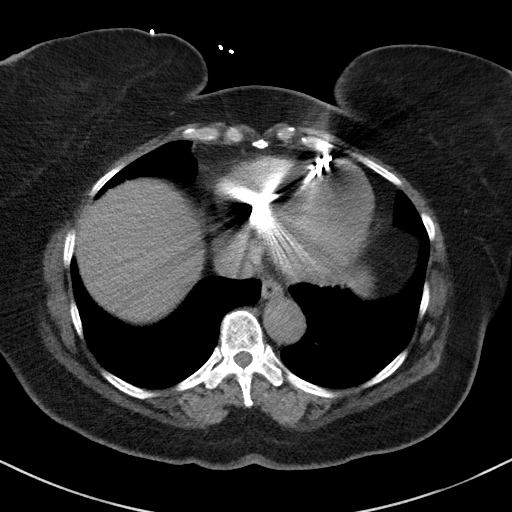
[im 87/91  soft-tissue]
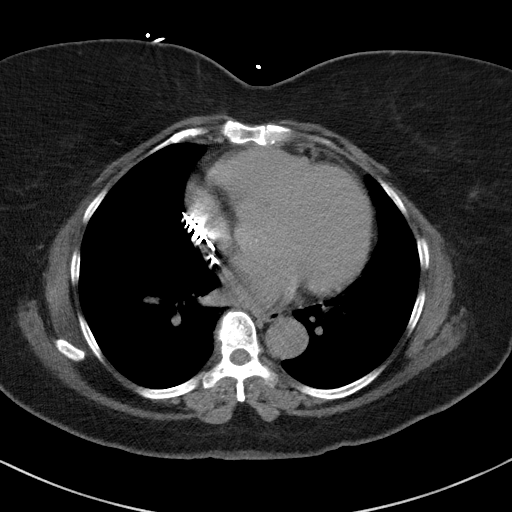

[Series 6: coronal st · coronal · 0.75mm/px · 3 of 89 slices shown]
[im 30/89  soft-tissue]
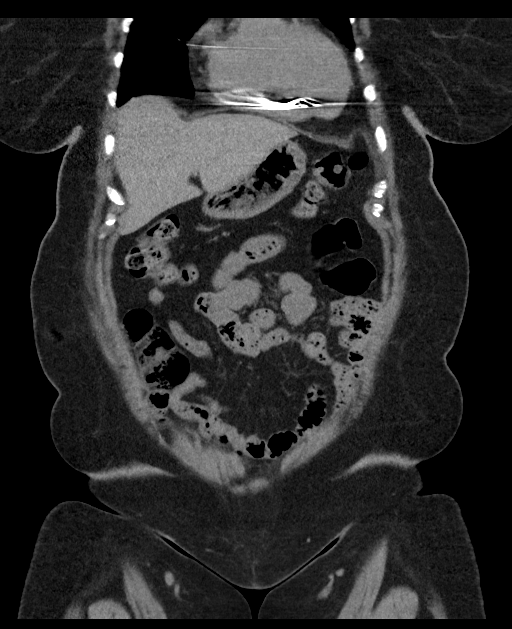
[im 40/89  soft-tissue]
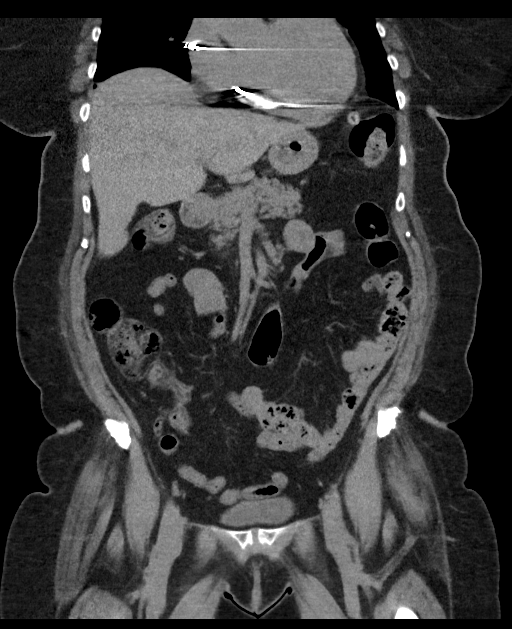
[im 49/89  soft-tissue]
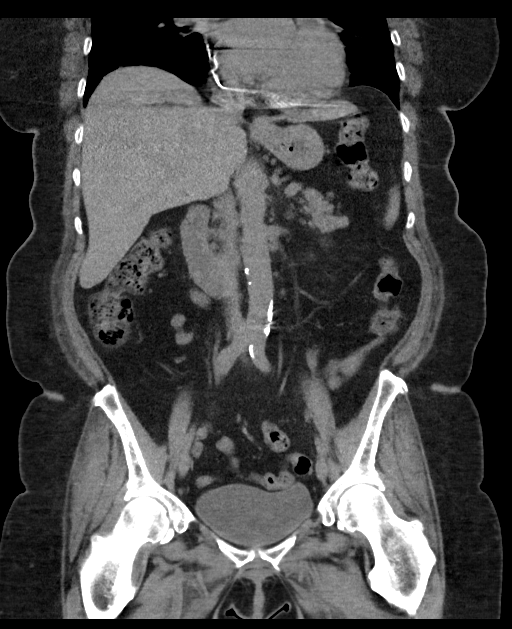

[16 of 46 positions shown; findings below may reference images not displayed]

FINDINGS: Evaluation of this exam is limited in the absence of intravenous
contrast.

Lower chest: The visualized lung bases are clear.

Top-normal cardiac size. Partially visualized AICD device.

No intra-abdominal free air or free fluid.

Hepatobiliary: The liver is grossly unremarkable. Subcentimeter
right hepatic hypodense focus is too small to characterize. No
intrahepatic biliary ductal dilatation. Cholecystectomy.

Pancreas: Unremarkable. No pancreatic ductal dilatation or
surrounding inflammatory changes.

Spleen: Normal in size without focal abnormality.

Adrenals/Urinary Tract: The adrenal glands are unremarkable. There
is no hydronephrosis or nephrolithiasis on either side. The
visualized ureters and urinary bladder appear unremarkable. Tiny
pocket of air within the bladder may be related to recent
instrumentation. Clinical correlation is recommended.

Stomach/Bowel: There is moderate stool throughout the colon. There
is no bowel obstruction or active inflammation. The appendix is
normal.

Vascular/Lymphatic: Mild aortoiliac atherosclerotic disease. The IVC
is grossly unremarkable. No portal venous gas. There is no
adenopathy.

Reproductive: Hysterectomy. No adnexal masses.

Other: None

Musculoskeletal: Degenerative changes of the spine. No acute osseous
pathology.
IMPRESSION: No acute intra-abdominal or pelvic pathology. No bowel obstruction.
Normal appendix.

## 2021-06-10 IMAGING — CR DG CHEST 2V
1 series · 2 of 2 positions shown · non-contrast
Comparison: 06/07/2019

CLINICAL DATA: Shortness of breath

EXAM:
CHEST - 2 VIEW

[Series 1: dg chest 2 view · 0.14mm/px · 2 of 2 slices shown]
[im 1/2]
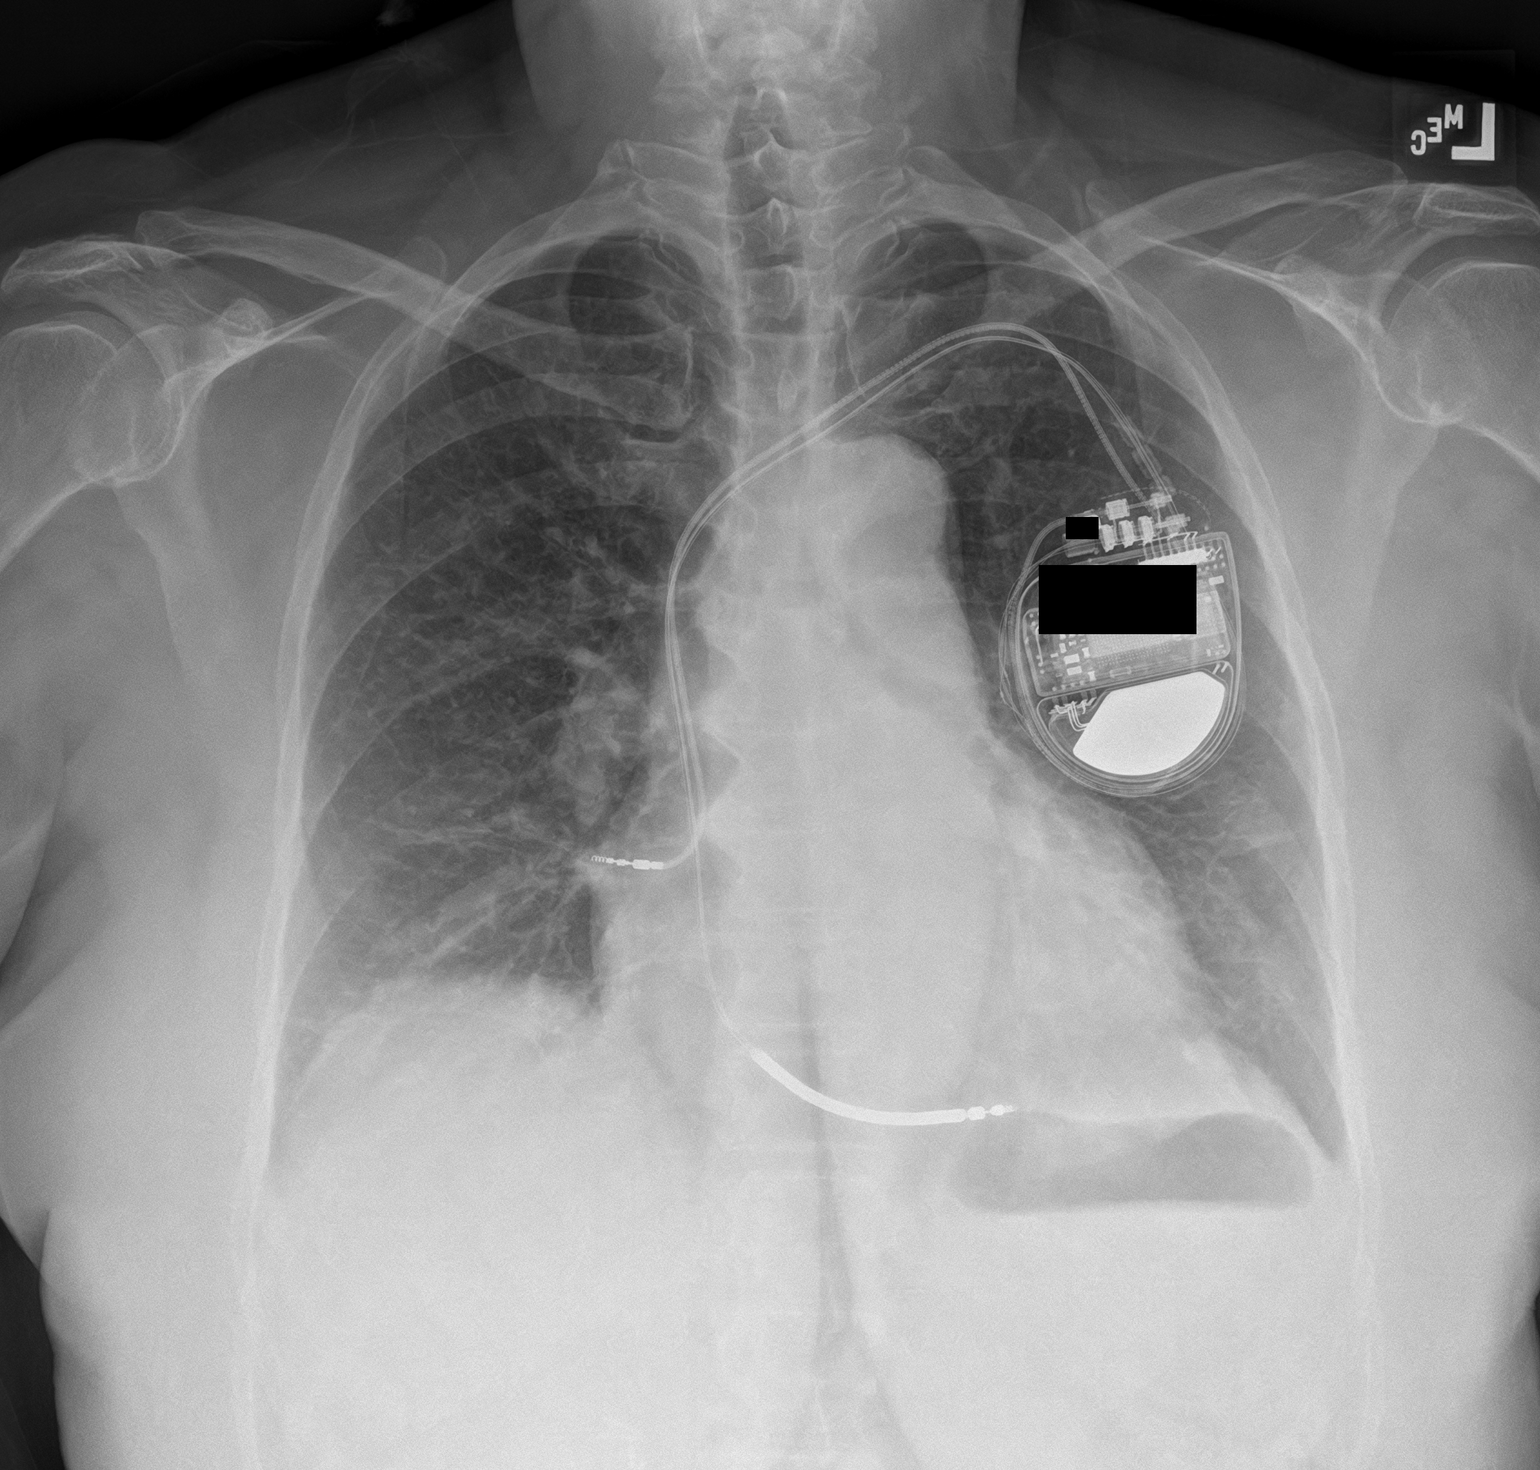
[im 2/2]
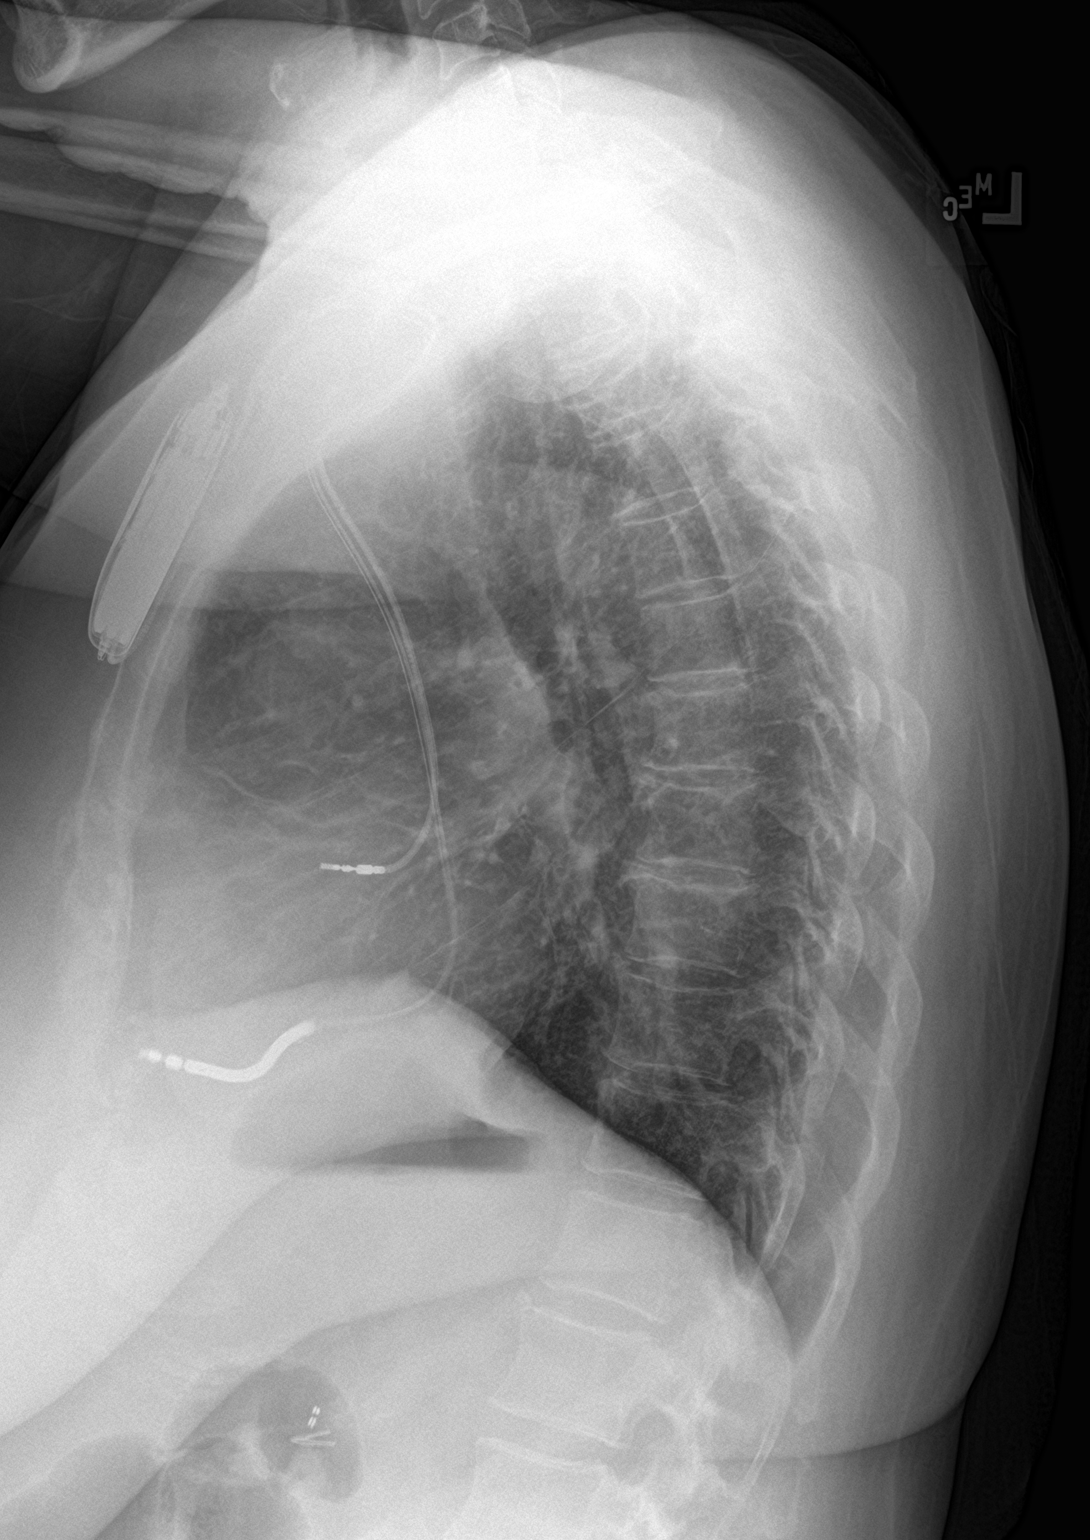

[2 of 2 positions shown; findings below may reference images not displayed]

FINDINGS: Defibrillator is again noted and stable. Cardiac shadow is mildly
enlarged but stable. The lungs are well aerated bilaterally. Minimal
right basilar atelectasis is seen. No sizable effusion or infiltrate
is noted. No bony abnormality is seen.
IMPRESSION: Mild right basilar atelectasis.

## 2021-09-12 ENCOUNTER — Other Ambulatory Visit
Admission: RE | Admit: 2021-09-12 | Discharge: 2021-09-12 | Disposition: A | Discharge status: Home / Self Care | Payer: BC Managed Care – PPO | Source: Ambulatory Visit | Attending: Cardiology | Admitting: Cardiology

## 2021-09-12 DIAGNOSIS — I5023 Acute on chronic systolic (congestive) heart failure: Secondary | ICD-10-CM | POA: Diagnosis present

## 2021-09-12 DIAGNOSIS — I5022 Chronic systolic (congestive) heart failure: Secondary | ICD-10-CM | POA: Diagnosis not present

## 2021-09-12 DIAGNOSIS — I10 Essential (primary) hypertension: Secondary | ICD-10-CM | POA: Diagnosis present

## 2021-09-12 LAB — BRAIN NATRIURETIC PEPTIDE: B Natriuretic Peptide: 72.2 pg/mL (ref 0.0–100.0)

## 2021-12-12 ENCOUNTER — Other Ambulatory Visit: Payer: Self-pay | Admitting: Physician Assistant

## 2021-12-12 DIAGNOSIS — R109 Unspecified abdominal pain: Secondary | ICD-10-CM

## 2021-12-12 DIAGNOSIS — Z87898 Personal history of other specified conditions: Secondary | ICD-10-CM

## 2021-12-15 ENCOUNTER — Ambulatory Visit: Payer: BC Managed Care – PPO

## 2021-12-15 ENCOUNTER — Ambulatory Visit
Admission: RE | Admit: 2021-12-15 | Discharge: 2021-12-15 | Disposition: A | Discharge status: Home / Self Care | Payer: BC Managed Care – PPO | Source: Ambulatory Visit | Attending: Physician Assistant | Admitting: Physician Assistant

## 2021-12-15 ENCOUNTER — Other Ambulatory Visit: Payer: Self-pay

## 2021-12-15 DIAGNOSIS — R109 Unspecified abdominal pain: Secondary | ICD-10-CM | POA: Insufficient documentation

## 2021-12-15 DIAGNOSIS — Z87898 Personal history of other specified conditions: Secondary | ICD-10-CM | POA: Insufficient documentation

## 2022-03-15 ENCOUNTER — Encounter: Payer: Self-pay | Admitting: *Deleted

## 2022-03-15 ENCOUNTER — Other Ambulatory Visit: Payer: Self-pay

## 2022-03-15 ENCOUNTER — Emergency Department
Admission: EM | Admit: 2022-03-15 | Discharge: 2022-03-15 | Disposition: A | Discharge status: Home / Self Care | Payer: BC Managed Care – PPO | Attending: Emergency Medicine | Admitting: Emergency Medicine

## 2022-03-15 ENCOUNTER — Emergency Department: Payer: BC Managed Care – PPO

## 2022-03-15 DIAGNOSIS — E119 Type 2 diabetes mellitus without complications: Secondary | ICD-10-CM | POA: Diagnosis not present

## 2022-03-15 DIAGNOSIS — J45909 Unspecified asthma, uncomplicated: Secondary | ICD-10-CM | POA: Diagnosis not present

## 2022-03-15 DIAGNOSIS — I509 Heart failure, unspecified: Secondary | ICD-10-CM | POA: Insufficient documentation

## 2022-03-15 DIAGNOSIS — M546 Pain in thoracic spine: Secondary | ICD-10-CM | POA: Insufficient documentation

## 2022-03-15 DIAGNOSIS — I110 Hypertensive heart disease with heart failure: Secondary | ICD-10-CM | POA: Diagnosis not present

## 2022-03-15 DIAGNOSIS — Y92410 Unspecified street and highway as the place of occurrence of the external cause: Secondary | ICD-10-CM | POA: Diagnosis not present

## 2022-03-15 DIAGNOSIS — S161XXA Strain of muscle, fascia and tendon at neck level, initial encounter: Secondary | ICD-10-CM | POA: Insufficient documentation

## 2022-03-15 DIAGNOSIS — S199XXA Unspecified injury of neck, initial encounter: Secondary | ICD-10-CM | POA: Diagnosis present

## 2022-03-15 DIAGNOSIS — R531 Weakness: Secondary | ICD-10-CM | POA: Insufficient documentation

## 2022-03-15 MED ORDER — MELOXICAM 15 MG PO TABS: 15.0000 mg | ORAL_TABLET | Freq: Every day | ORAL | 2 refills | Status: DC

## 2022-03-15 MED ORDER — CYCLOBENZAPRINE HCL 10 MG PO TABS: 10.0000 mg | ORAL_TABLET | Freq: Three times a day (TID) | ORAL | 0 refills | Status: DC | PRN

## 2022-03-15 NOTE — Discharge Instructions (Signed)
Follow-up with your regular doctor as needed.  Follow-up with orthopedics if continued neck pain in 1 week.  Use ice to the spine.  In 3 days she may go to wet heat followed by ice.  Do not use a dry heating pad as this will make the muscle more brittle and causing more pain.  Use your medications as prescribed.  The Flexeril may make you drowsy so you may want to cut this in half the first time you use it.

## 2022-03-15 NOTE — ED Notes (Signed)
Pt A&O, ambulating with steady gait, pt given discharge instructions.
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
## 2022-07-19 ENCOUNTER — Other Ambulatory Visit: Payer: Self-pay

## 2022-07-19 ENCOUNTER — Inpatient Hospital Stay
Admission: EM | Admit: 2022-07-19 | Discharge: 2022-07-24 | DRG: 291 | Disposition: A | Discharge status: Home / Self Care | Payer: BC Managed Care – PPO | Attending: Internal Medicine | Admitting: Internal Medicine

## 2022-07-19 ENCOUNTER — Emergency Department: Payer: BC Managed Care – PPO

## 2022-07-19 DIAGNOSIS — Z8616 Personal history of COVID-19: Secondary | ICD-10-CM

## 2022-07-19 DIAGNOSIS — Z9049 Acquired absence of other specified parts of digestive tract: Secondary | ICD-10-CM

## 2022-07-19 DIAGNOSIS — R42 Dizziness and giddiness: Secondary | ICD-10-CM | POA: Diagnosis present

## 2022-07-19 DIAGNOSIS — E039 Hypothyroidism, unspecified: Secondary | ICD-10-CM | POA: Diagnosis present

## 2022-07-19 DIAGNOSIS — Z8249 Family history of ischemic heart disease and other diseases of the circulatory system: Secondary | ICD-10-CM

## 2022-07-19 DIAGNOSIS — Z95810 Presence of automatic (implantable) cardiac defibrillator: Secondary | ICD-10-CM

## 2022-07-19 DIAGNOSIS — Z79899 Other long term (current) drug therapy: Secondary | ICD-10-CM

## 2022-07-19 DIAGNOSIS — I951 Orthostatic hypotension: Secondary | ICD-10-CM | POA: Diagnosis not present

## 2022-07-19 DIAGNOSIS — Z794 Long term (current) use of insulin: Secondary | ICD-10-CM

## 2022-07-19 DIAGNOSIS — N179 Acute kidney failure, unspecified: Secondary | ICD-10-CM | POA: Diagnosis present

## 2022-07-19 DIAGNOSIS — I351 Nonrheumatic aortic (valve) insufficiency: Secondary | ICD-10-CM | POA: Diagnosis present

## 2022-07-19 DIAGNOSIS — D696 Thrombocytopenia, unspecified: Secondary | ICD-10-CM | POA: Diagnosis present

## 2022-07-19 DIAGNOSIS — R7989 Other specified abnormal findings of blood chemistry: Secondary | ICD-10-CM

## 2022-07-19 DIAGNOSIS — E1143 Type 2 diabetes mellitus with diabetic autonomic (poly)neuropathy: Secondary | ICD-10-CM | POA: Diagnosis present

## 2022-07-19 DIAGNOSIS — J449 Chronic obstructive pulmonary disease, unspecified: Secondary | ICD-10-CM | POA: Diagnosis present

## 2022-07-19 DIAGNOSIS — Z79890 Hormone replacement therapy (postmenopausal): Secondary | ICD-10-CM

## 2022-07-19 DIAGNOSIS — Z881 Allergy status to other antibiotic agents status: Secondary | ICD-10-CM

## 2022-07-19 DIAGNOSIS — K3184 Gastroparesis: Secondary | ICD-10-CM | POA: Diagnosis present

## 2022-07-19 DIAGNOSIS — I130 Hypertensive heart and chronic kidney disease with heart failure and stage 1 through stage 4 chronic kidney disease, or unspecified chronic kidney disease: Secondary | ICD-10-CM | POA: Diagnosis not present

## 2022-07-19 DIAGNOSIS — Z880 Allergy status to penicillin: Secondary | ICD-10-CM

## 2022-07-19 DIAGNOSIS — Z7982 Long term (current) use of aspirin: Secondary | ICD-10-CM

## 2022-07-19 DIAGNOSIS — E890 Postprocedural hypothyroidism: Secondary | ICD-10-CM | POA: Diagnosis present

## 2022-07-19 DIAGNOSIS — E669 Obesity, unspecified: Secondary | ICD-10-CM | POA: Diagnosis present

## 2022-07-19 DIAGNOSIS — Z1152 Encounter for screening for COVID-19: Secondary | ICD-10-CM

## 2022-07-19 DIAGNOSIS — R0602 Shortness of breath: Secondary | ICD-10-CM

## 2022-07-19 DIAGNOSIS — I5023 Acute on chronic systolic (congestive) heart failure: Secondary | ICD-10-CM | POA: Diagnosis present

## 2022-07-19 DIAGNOSIS — T447X5A Adverse effect of beta-adrenoreceptor antagonists, initial encounter: Secondary | ICD-10-CM | POA: Diagnosis not present

## 2022-07-19 DIAGNOSIS — Z6830 Body mass index (BMI) 30.0-30.9, adult: Secondary | ICD-10-CM

## 2022-07-19 DIAGNOSIS — J441 Chronic obstructive pulmonary disease with (acute) exacerbation: Secondary | ICD-10-CM | POA: Diagnosis present

## 2022-07-19 DIAGNOSIS — I509 Heart failure, unspecified: Principal | ICD-10-CM

## 2022-07-19 DIAGNOSIS — B9789 Other viral agents as the cause of diseases classified elsewhere: Secondary | ICD-10-CM | POA: Diagnosis present

## 2022-07-19 DIAGNOSIS — Z888 Allergy status to other drugs, medicaments and biological substances status: Secondary | ICD-10-CM

## 2022-07-19 DIAGNOSIS — R9431 Abnormal electrocardiogram [ECG] [EKG]: Secondary | ICD-10-CM

## 2022-07-19 DIAGNOSIS — F17210 Nicotine dependence, cigarettes, uncomplicated: Secondary | ICD-10-CM | POA: Diagnosis present

## 2022-07-19 DIAGNOSIS — N1831 Chronic kidney disease, stage 3a: Secondary | ICD-10-CM | POA: Diagnosis present

## 2022-07-19 DIAGNOSIS — J9601 Acute respiratory failure with hypoxia: Secondary | ICD-10-CM | POA: Diagnosis not present

## 2022-07-19 DIAGNOSIS — E1122 Type 2 diabetes mellitus with diabetic chronic kidney disease: Secondary | ICD-10-CM

## 2022-07-19 DIAGNOSIS — E119 Type 2 diabetes mellitus without complications: Secondary | ICD-10-CM

## 2022-07-19 DIAGNOSIS — O903 Peripartum cardiomyopathy: Secondary | ICD-10-CM | POA: Diagnosis present

## 2022-07-19 LAB — CBC WITH DIFFERENTIAL/PLATELET
Abs Immature Granulocytes: 0.02 10*3/uL (ref 0.00–0.07)
Basophils Absolute: 0.1 10*3/uL (ref 0.0–0.1)
Basophils Relative: 1 %
Eosinophils Absolute: 0.4 10*3/uL (ref 0.0–0.5)
Eosinophils Relative: 5 %
HCT: 44.8 % (ref 36.0–46.0)
Hemoglobin: 14.1 g/dL (ref 12.0–15.0)
Immature Granulocytes: 0 %
Lymphocytes Relative: 24 %
Lymphs Abs: 1.8 10*3/uL (ref 0.7–4.0)
MCH: 27.1 pg (ref 26.0–34.0)
MCHC: 31.5 g/dL (ref 30.0–36.0)
MCV: 86.2 fL (ref 80.0–100.0)
Monocytes Absolute: 0.5 10*3/uL (ref 0.1–1.0)
Monocytes Relative: 7 %
Neutro Abs: 4.7 10*3/uL (ref 1.7–7.7)
Neutrophils Relative %: 63 %
Platelets: 145 10*3/uL — ABNORMAL LOW (ref 150–400)
RBC: 5.2 MIL/uL — ABNORMAL HIGH (ref 3.87–5.11)
RDW: 15.4 % (ref 11.5–15.5)
WBC: 7.4 10*3/uL (ref 4.0–10.5)
nRBC: 0 % (ref 0.0–0.2)

## 2022-07-19 LAB — BASIC METABOLIC PANEL
Anion gap: 6 (ref 5–15)
BUN: 21 mg/dL — ABNORMAL HIGH (ref 6–20)
CO2: 27 mmol/L (ref 22–32)
Calcium: 8.9 mg/dL (ref 8.9–10.3)
Chloride: 108 mmol/L (ref 98–111)
Creatinine, Ser: 1.42 mg/dL — ABNORMAL HIGH (ref 0.44–1.00)
GFR, Estimated: 42 mL/min — ABNORMAL LOW (ref >60)
Glucose, Bld: 99 mg/dL (ref 70–99)
Potassium: 3.5 mmol/L (ref 3.5–5.1)
Sodium: 141 mmol/L (ref 135–145)

## 2022-07-19 LAB — TROPONIN I (HIGH SENSITIVITY): Troponin I (High Sensitivity): 48 ng/L — ABNORMAL HIGH (ref <18)

## 2022-07-19 LAB — SARS CORONAVIRUS 2 BY RT PCR: SARS Coronavirus 2 by RT PCR: NEGATIVE

## 2022-07-19 LAB — BRAIN NATRIURETIC PEPTIDE: B Natriuretic Peptide: 63.4 pg/mL (ref 0.0–100.0)

## 2022-07-19 MED ORDER — ALBUTEROL SULFATE (2.5 MG/3ML) 0.083% IN NEBU
2.5000 mg | INHALATION_SOLUTION | Freq: Once | RESPIRATORY_TRACT | Status: AC
  Administered 2022-07-19: 2.5 mg via RESPIRATORY_TRACT
  Filled 2022-07-19: qty 3

## 2022-07-19 MED ORDER — FUROSEMIDE 10 MG/ML IJ SOLN
80.0000 mg | Freq: Once | INTRAMUSCULAR | Status: AC
  Administered 2022-07-19: 80 mg via INTRAVENOUS
  Filled 2022-07-19: qty 8

## 2022-07-19 NOTE — ED Triage Notes (Signed)
Pt. To ED via POV for productive cough and congestion with dizziness and weakness, with discomfort when coughing. Pt. States she coughing up brownish/yellow mucus. Pt. Denies CP, n/v/d.

## 2022-07-19 NOTE — ED Provider Notes (Signed)
[facility] Provider Note    Event Date/Time   First MD Initiated Contact with Patient 07/19/22 2118     (approximate)   History   Cough (Pt. To ED via POV for productive cough and congestion with dizziness and weakness, with discomfort when coughing. Pt. States she coughing up brownish/yellow mucus. Pt. Denies CP, n/v/d.)   HPI {Remember to add pertinent medical, surgical, social, and/or OB history to HPI:1} Heather Choi is a 61 y.o. female  ***   Cough productive of pinkish sputum, increased shortness of breath, orthopnea, no swelling.  Patient feels somewhat lightheaded and weak.  No chest pain.   Physical Exam   Triage Vital Signs: ED Triage Vitals  Enc Vitals Group     BP 07/19/22 1726 120/84     Pulse Rate 07/19/22 1726 81     Resp 07/19/22 1726 18     Temp 07/19/22 1726 98.4 F (36.9 C)     Temp Source 07/19/22 1726 Oral     SpO2 07/19/22 1726 93 %     Weight 07/19/22 1728 160 lb (72.6 kg)     Height 07/19/22 1728 5\' 1"  (1.549 m)     Head Circumference --      Peak Flow --      Pain Score 07/19/22 1727 3     Pain Loc --      Pain Edu? --      Excl. in [place]? --     Most recent vital signs: Vitals:   07/19/22 1726  BP: 120/84  Pulse: 81  Resp: 18  Temp: 98.4 F (36.9 C)  SpO2: 93%    {Only need to document appropriate and relevant physical exam:1} General: Awake, no distress. *** CV:  Good peripheral perfusion. *** Resp:  Normal effort. *** Abd:  No distention. *** Other:  ***   ED Results / Procedures / Treatments   Labs (all labs ordered are listed, but only abnormal results are displayed) Labs Reviewed  SARS CORONAVIRUS 2 BY RT PCR  BRAIN NATRIURETIC PEPTIDE  BASIC METABOLIC PANEL  CBC WITH DIFFERENTIAL/PLATELET  TROPONIN I (HIGH SENSITIVITY)     EKG  ***   RADIOLOGY *** {USE THE WORD "INTERPRETED"!! You MUST document your own interpretation of imaging, as well as the fact that you reviewed the  radiologist's report!:1}   PROCEDURES:  Critical Care performed: {CriticalCareYesNo:19197::"Yes, see critical care procedure note(s)","No"}  Procedures   MEDICATIONS ORDERED IN ED: Medications  furosemide (LASIX) injection 80 mg (has no administration in time range)  albuterol (PROVENTIL) (2.5 MG/3ML) 0.083% nebulizer solution 2.5 mg (has no administration in time range)  albuterol (PROVENTIL) (2.5 MG/3ML) 0.083% nebulizer solution 2.5 mg (has no administration in time range)     IMPRESSION / MDM / ASSESSMENT AND PLAN / ED COURSE  I reviewed the triage vital signs and the nursing notes.                              Differential diagnosis includes, but is not limited to, ***  Patient's presentation is most consistent with {EM COPA:27473}  {If the patient is on the monitor, remove the brackets and asterisks on the sentence below and remember to document it as a Procedure as well. Otherwise delete the sentence below:1} {**The patient is on the cardiac monitor to evaluate for evidence of arrhythmia and/or significant heart rate changes.**} {Remember to include, when applicable, any/all of the following  data: independent review of imaging independent review of labs (comment specifically on pertinent positives and negatives) review of specific prior hospitalizations, PCP/specialist notes, etc. discuss meds given and prescribed document any discussion with consultants (including hospitalists) any clinical decision tools you used and why (PECARN, NEXUS, etc.) did you consider admitting the patient? document social determinants of health affecting patient's care (homelessness, inability to follow up in a timely fashion, etc) document any pre-existing conditions increasing risk on current visit (e.g. diabetes and HTN increasing danger of high-risk chest pain/ACS) describes what meds you gave (especially parenteral) and why any other interventions?:1}     FINAL CLINICAL IMPRESSION(S)  / ED DIAGNOSES   Final diagnoses:  None     Rx / DC Orders   ED Discharge Orders     None        Note:  This document was prepared using Dragon voice recognition software and may include unintentional dictation errors.

## 2022-07-19 NOTE — ED Provider Triage Note (Signed)
Emergency Medicine Provider Triage Evaluation Note  Heather Choi , a 61 y.o. female  was evaluated in triage.  Pt complains of cough and congestion.  Noticed some dark sputum questionable blood in her sputum.  No fever or chills.  Symptoms since Sunday.  Review of Systems  Positive:  Negative:   Physical Exam  There were no vitals taken for this visit. Gen:   Awake, no distress   Resp:  Normal effort  MSK:   Moves extremities without difficulty  Other:  Lungs with some wheezing noted  Medical Decision Making  Medically screening exam initiated at 5:26 PM.  Appropriate orders placed.  Heather Choi was informed that the remainder of the evaluation will be completed by another provider, this initial triage assessment does not replace that evaluation, and the importance of remaining in the ED until their evaluation is complete.  Chest x-ray, COVID   Versie Starks, PA-C 07/19/22 1726

## 2022-07-20 ENCOUNTER — Encounter: Payer: Self-pay | Admitting: Family Medicine

## 2022-07-20 ENCOUNTER — Inpatient Hospital Stay: Payer: BC Managed Care – PPO

## 2022-07-20 DIAGNOSIS — N179 Acute kidney failure, unspecified: Secondary | ICD-10-CM | POA: Diagnosis present

## 2022-07-20 DIAGNOSIS — Z794 Long term (current) use of insulin: Secondary | ICD-10-CM

## 2022-07-20 DIAGNOSIS — Z881 Allergy status to other antibiotic agents status: Secondary | ICD-10-CM | POA: Diagnosis not present

## 2022-07-20 DIAGNOSIS — I130 Hypertensive heart and chronic kidney disease with heart failure and stage 1 through stage 4 chronic kidney disease, or unspecified chronic kidney disease: Secondary | ICD-10-CM | POA: Diagnosis present

## 2022-07-20 DIAGNOSIS — Z8616 Personal history of COVID-19: Secondary | ICD-10-CM | POA: Diagnosis not present

## 2022-07-20 DIAGNOSIS — E1122 Type 2 diabetes mellitus with diabetic chronic kidney disease: Secondary | ICD-10-CM

## 2022-07-20 DIAGNOSIS — D696 Thrombocytopenia, unspecified: Secondary | ICD-10-CM | POA: Diagnosis present

## 2022-07-20 DIAGNOSIS — F17210 Nicotine dependence, cigarettes, uncomplicated: Secondary | ICD-10-CM | POA: Diagnosis present

## 2022-07-20 DIAGNOSIS — E119 Type 2 diabetes mellitus without complications: Secondary | ICD-10-CM

## 2022-07-20 DIAGNOSIS — E890 Postprocedural hypothyroidism: Secondary | ICD-10-CM | POA: Diagnosis present

## 2022-07-20 DIAGNOSIS — E1143 Type 2 diabetes mellitus with diabetic autonomic (poly)neuropathy: Secondary | ICD-10-CM | POA: Diagnosis present

## 2022-07-20 DIAGNOSIS — Z1152 Encounter for screening for COVID-19: Secondary | ICD-10-CM | POA: Diagnosis not present

## 2022-07-20 DIAGNOSIS — T447X5A Adverse effect of beta-adrenoreceptor antagonists, initial encounter: Secondary | ICD-10-CM | POA: Diagnosis not present

## 2022-07-20 DIAGNOSIS — Z6830 Body mass index (BMI) 30.0-30.9, adult: Secondary | ICD-10-CM | POA: Diagnosis not present

## 2022-07-20 DIAGNOSIS — J441 Chronic obstructive pulmonary disease with (acute) exacerbation: Secondary | ICD-10-CM | POA: Diagnosis present

## 2022-07-20 DIAGNOSIS — Z95810 Presence of automatic (implantable) cardiac defibrillator: Secondary | ICD-10-CM | POA: Diagnosis not present

## 2022-07-20 DIAGNOSIS — I351 Nonrheumatic aortic (valve) insufficiency: Secondary | ICD-10-CM | POA: Diagnosis present

## 2022-07-20 DIAGNOSIS — I509 Heart failure, unspecified: Secondary | ICD-10-CM | POA: Diagnosis not present

## 2022-07-20 DIAGNOSIS — Z8249 Family history of ischemic heart disease and other diseases of the circulatory system: Secondary | ICD-10-CM | POA: Diagnosis not present

## 2022-07-20 DIAGNOSIS — I951 Orthostatic hypotension: Secondary | ICD-10-CM | POA: Diagnosis not present

## 2022-07-20 DIAGNOSIS — E039 Hypothyroidism, unspecified: Secondary | ICD-10-CM

## 2022-07-20 DIAGNOSIS — Z9049 Acquired absence of other specified parts of digestive tract: Secondary | ICD-10-CM | POA: Diagnosis not present

## 2022-07-20 DIAGNOSIS — I5023 Acute on chronic systolic (congestive) heart failure: Secondary | ICD-10-CM | POA: Diagnosis present

## 2022-07-20 DIAGNOSIS — O903 Peripartum cardiomyopathy: Secondary | ICD-10-CM | POA: Diagnosis present

## 2022-07-20 DIAGNOSIS — N181 Chronic kidney disease, stage 1: Secondary | ICD-10-CM

## 2022-07-20 DIAGNOSIS — K3184 Gastroparesis: Secondary | ICD-10-CM | POA: Diagnosis present

## 2022-07-20 DIAGNOSIS — R0602 Shortness of breath: Secondary | ICD-10-CM | POA: Diagnosis present

## 2022-07-20 DIAGNOSIS — J449 Chronic obstructive pulmonary disease, unspecified: Secondary | ICD-10-CM

## 2022-07-20 DIAGNOSIS — N1831 Chronic kidney disease, stage 3a: Secondary | ICD-10-CM | POA: Diagnosis present

## 2022-07-20 DIAGNOSIS — J9601 Acute respiratory failure with hypoxia: Secondary | ICD-10-CM | POA: Diagnosis not present

## 2022-07-20 LAB — RESPIRATORY PANEL BY PCR

## 2022-07-20 LAB — VITAMIN B12: Vitamin B-12: 539 pg/mL (ref 180–914)

## 2022-07-20 LAB — CBC
HCT: 43.7 % (ref 36.0–46.0)
Hemoglobin: 14 g/dL (ref 12.0–15.0)
MCH: 27.7 pg (ref 26.0–34.0)
MCHC: 32 g/dL (ref 30.0–36.0)
MCV: 86.5 fL (ref 80.0–100.0)
Platelets: 115 10*3/uL — ABNORMAL LOW (ref 150–400)
RBC: 5.05 MIL/uL (ref 3.87–5.11)
RDW: 15.3 % (ref 11.5–15.5)
WBC: 7.1 10*3/uL (ref 4.0–10.5)
nRBC: 0 % (ref 0.0–0.2)

## 2022-07-20 LAB — MAGNESIUM: Magnesium: 1.9 mg/dL (ref 1.7–2.4)

## 2022-07-20 LAB — BASIC METABOLIC PANEL
Anion gap: 7 (ref 5–15)
BUN: 23 mg/dL — ABNORMAL HIGH (ref 6–20)
CO2: 27 mmol/L (ref 22–32)
Calcium: 8.5 mg/dL — ABNORMAL LOW (ref 8.9–10.3)
Chloride: 105 mmol/L (ref 98–111)
Creatinine, Ser: 1.33 mg/dL — ABNORMAL HIGH (ref 0.44–1.00)
GFR, Estimated: 46 mL/min — ABNORMAL LOW (ref >60)
Glucose, Bld: 83 mg/dL (ref 70–99)
Potassium: 3.6 mmol/L (ref 3.5–5.1)
Sodium: 139 mmol/L (ref 135–145)

## 2022-07-20 LAB — TROPONIN I (HIGH SENSITIVITY): Troponin I (High Sensitivity): 48 ng/L — ABNORMAL HIGH (ref <18)

## 2022-07-20 MED ORDER — NICOTINE 14 MG/24HR TD PT24
14.0000 mg | MEDICATED_PATCH | Freq: Every day | TRANSDERMAL | Status: DC
  Administered 2022-07-20 – 2022-07-24 (×5): 14 mg via TRANSDERMAL
  Filled 2022-07-20 (×5): qty 1

## 2022-07-20 MED ORDER — CYCLOBENZAPRINE HCL 10 MG PO TABS: 10.0000 mg | ORAL_TABLET | Freq: Three times a day (TID) | ORAL | Status: DC | PRN

## 2022-07-20 MED ORDER — SPIRONOLACTONE 25 MG PO TABS
25.0000 mg | ORAL_TABLET | Freq: Every day | ORAL | Status: DC
  Filled 2022-07-20: qty 1

## 2022-07-20 MED ORDER — METOCLOPRAMIDE HCL 5 MG/ML IJ SOLN: 5.0000 mg | Freq: Four times a day (QID) | INTRAMUSCULAR | Status: DC | PRN

## 2022-07-20 MED ORDER — TRAZODONE HCL 50 MG PO TABS
25.0000 mg | ORAL_TABLET | Freq: Every evening | ORAL | Status: DC | PRN
  Administered 2022-07-20 – 2022-07-23 (×5): 25 mg via ORAL
  Filled 2022-07-20 (×5): qty 1

## 2022-07-20 MED ORDER — LEVOTHYROXINE SODIUM 137 MCG PO TABS
137.0000 ug | ORAL_TABLET | Freq: Every day | ORAL | Status: DC
  Administered 2022-07-20 – 2022-07-24 (×5): 137 ug via ORAL
  Filled 2022-07-20 (×5): qty 1

## 2022-07-20 MED ORDER — GUAIFENESIN ER 600 MG PO TB12
600.0000 mg | ORAL_TABLET | Freq: Two times a day (BID) | ORAL | Status: DC
  Administered 2022-07-20 – 2022-07-24 (×10): 600 mg via ORAL
  Filled 2022-07-20 (×10): qty 1

## 2022-07-20 MED ORDER — MAGNESIUM HYDROXIDE 400 MG/5ML PO SUSP
30.0000 mL | Freq: Every day | ORAL | Status: DC | PRN
  Administered 2022-07-23: 30 mL via ORAL
  Filled 2022-07-20: qty 30

## 2022-07-20 MED ORDER — IPRATROPIUM-ALBUTEROL 0.5-2.5 (3) MG/3ML IN SOLN
3.0000 mL | Freq: Four times a day (QID) | RESPIRATORY_TRACT | Status: DC
  Administered 2022-07-20 – 2022-07-22 (×6): 3 mL via RESPIRATORY_TRACT
  Filled 2022-07-20 (×8): qty 3

## 2022-07-20 MED ORDER — ENOXAPARIN SODIUM 40 MG/0.4ML IJ SOSY
40.0000 mg | PREFILLED_SYRINGE | INTRAMUSCULAR | Status: DC
  Administered 2022-07-20 – 2022-07-24 (×5): 40 mg via SUBCUTANEOUS
  Filled 2022-07-20 (×5): qty 0.4

## 2022-07-20 MED ORDER — LORATADINE 10 MG PO TABS
10.0000 mg | ORAL_TABLET | Freq: Every day | ORAL | Status: DC
  Administered 2022-07-20 – 2022-07-24 (×5): 10 mg via ORAL
  Filled 2022-07-20 (×5): qty 1

## 2022-07-20 MED ORDER — TORSEMIDE 20 MG PO TABS
50.0000 mg | ORAL_TABLET | Freq: Two times a day (BID) | ORAL | Status: DC
  Administered 2022-07-20 – 2022-07-24 (×8): 50 mg via ORAL
  Filled 2022-07-20 (×8): qty 3

## 2022-07-20 MED ORDER — FUROSEMIDE 10 MG/ML IJ SOLN
40.0000 mg | Freq: Every day | INTRAMUSCULAR | Status: DC
  Administered 2022-07-20: 40 mg via INTRAVENOUS
  Filled 2022-07-20: qty 4

## 2022-07-20 MED ORDER — ADULT MULTIVITAMIN W/MINERALS CH
1.0000 | ORAL_TABLET | Freq: Every day | ORAL | Status: DC
  Administered 2022-07-20 – 2022-07-24 (×5): 1 via ORAL
  Filled 2022-07-20 (×5): qty 1

## 2022-07-20 MED ORDER — ACETAMINOPHEN 650 MG RE SUPP: 650.0000 mg | Freq: Four times a day (QID) | RECTAL | Status: DC | PRN

## 2022-07-20 MED ORDER — SACUBITRIL-VALSARTAN 24-26 MG PO TABS
1.0000 | ORAL_TABLET | Freq: Two times a day (BID) | ORAL | Status: DC
  Filled 2022-07-20: qty 1

## 2022-07-20 MED ORDER — ALPRAZOLAM 0.5 MG PO TABS
0.5000 mg | ORAL_TABLET | Freq: Every evening | ORAL | Status: DC | PRN
  Administered 2022-07-23: 0.5 mg via ORAL
  Filled 2022-07-20: qty 1

## 2022-07-20 MED ORDER — EMPAGLIFLOZIN 10 MG PO TABS
10.0000 mg | ORAL_TABLET | Freq: Every day | ORAL | Status: DC
  Administered 2022-07-20: 10 mg via ORAL
  Filled 2022-07-20: qty 1

## 2022-07-20 MED ORDER — DIGOXIN 125 MCG PO TABS
0.1250 mg | ORAL_TABLET | Freq: Every day | ORAL | Status: DC
  Administered 2022-07-20: 0.125 mg via ORAL
  Filled 2022-07-20: qty 1

## 2022-07-20 MED ORDER — HYDROCOD POLI-CHLORPHE POLI ER 10-8 MG/5ML PO SUER
5.0000 mL | Freq: Two times a day (BID) | ORAL | Status: DC | PRN
  Administered 2022-07-20 – 2022-07-24 (×6): 5 mL via ORAL
  Filled 2022-07-20 (×6): qty 5

## 2022-07-20 MED ORDER — CARVEDILOL 12.5 MG PO TABS
12.5000 mg | ORAL_TABLET | Freq: Two times a day (BID) | ORAL | Status: DC
  Administered 2022-07-20 – 2022-07-22 (×5): 12.5 mg via ORAL
  Filled 2022-07-20: qty 1
  Filled 2022-07-20 (×2): qty 2
  Filled 2022-07-20: qty 1
  Filled 2022-07-20: qty 2

## 2022-07-20 MED ORDER — POTASSIUM CHLORIDE CRYS ER 20 MEQ PO TBCR
20.0000 meq | EXTENDED_RELEASE_TABLET | Freq: Two times a day (BID) | ORAL | Status: DC
  Administered 2022-07-20 – 2022-07-24 (×9): 20 meq via ORAL
  Filled 2022-07-20 (×9): qty 1

## 2022-07-20 MED ORDER — ACETAMINOPHEN 325 MG PO TABS
650.0000 mg | ORAL_TABLET | Freq: Four times a day (QID) | ORAL | Status: DC | PRN
  Administered 2022-07-22 – 2022-07-24 (×2): 650 mg via ORAL
  Filled 2022-07-20 (×2): qty 2

## 2022-07-20 MED ORDER — BENZONATATE 100 MG PO CAPS
100.0000 mg | ORAL_CAPSULE | Freq: Once | ORAL | Status: AC
  Administered 2022-07-20: 100 mg via ORAL
  Filled 2022-07-20: qty 1

## 2022-07-20 MED ORDER — ASPIRIN 81 MG PO TBEC
81.0000 mg | DELAYED_RELEASE_TABLET | Freq: Every day | ORAL | Status: DC
  Administered 2022-07-20 – 2022-07-24 (×5): 81 mg via ORAL
  Filled 2022-07-20 (×5): qty 1

## 2022-07-20 MED ORDER — ENSURE ENLIVE PO LIQD
237.0000 mL | Freq: Two times a day (BID) | ORAL | Status: DC
  Administered 2022-07-20 – 2022-07-24 (×6): 237 mL via ORAL
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
## 2022-07-20 NOTE — ED Provider Notes (Signed)
Assumed care at shift change.  Patient here with cough, shortness of breath and appears to be having a CHF exacerbation per previous provider.  Also has prolonged QT interval.  First troponin slightly more elevated than her baseline.  Second is pending.  Previous provider has recommended admission and patient agrees.   Consulted and discussed patient's case with hospitalist, Dr. Sidney Ace.  I have recommended admission and consulting physician agrees and will place admission orders.  Patient (and family if present) agree with this plan.   I reviewed all nursing notes, vitals, pertinent previous records.  All labs, EKGs, imaging ordered have been independently reviewed and interpreted by myself.    Curby Carswell, Delice Bison, DO 07/20/22 [phone]

## 2022-07-20 NOTE — ED Notes (Signed)
Pt asleep in bed, 84% room air. Placed on 2L [place].

## 2022-07-20 NOTE — Assessment & Plan Note (Signed)
-   We will continue Synthroid. 

## 2022-07-20 NOTE — ED Notes (Signed)
Pt ambulatory independently to and from restroom without difficulty. Pt reconnected to monitor, WCTM.

## 2022-07-20 NOTE — ED Notes (Signed)
Trays and room cleaned in room. Pt and visitor given something to drink. Pt's [place] on bridge of nose. [place] put back in place and SPO2 improved.

## 2022-07-20 NOTE — H&P (Addendum)
[place]   PATIENT NAME: Heather Choi    MR#:  [id number]  DATE OF BIRTH:  [birth year]  DATE OF ADMISSION:  07/19/2022  PRIMARY CARE PHYSICIAN: Derinda Late, MD   Patient is coming from: Home  REQUESTING/REFERRING PHYSICIAN: Ward, Delice Bison, DO  CHIEF COMPLAINT:   Chief Complaint  Patient presents with  . Cough    Pt. To ED via POV for productive cough and congestion with dizziness and weakness, with discomfort when coughing. Pt. States she coughing up brownish/yellow mucus. Pt. Denies CP, n/v/d.    HISTORY OF PRESENT ILLNESS:  Heather Choi is a 61 y.o. African-American female with medical history significant for asthma, CHF, COPD, type diabetes mellitus and hypertension as well as hypothyroidism, who presented to the emergency room with acute onset of dyspnea with associated orthopnea and paroxysmal nocturnal dyspnea as well as dyspnea on exertion of the last week.  The patient has been having cough with expectoration of clear and later brownish sputum.  She admitted to abdominal distention without lower extremity edema.  No dysuria, oliguria or hematuria or flank pain.  No fever or chills.  ED Course: When she came to the ER, vital signs were within normal.  Labs revealed showed a potassium of 3.5 and a BUN of 21 with creatinine of 1.42 above previous levels.  Magnesium was 1.9 and BNP 63.4 with high sensitive troponin I of 48 twice.  CBC was unremarkable except for thrombocytopenia 145. COVID-19 PCR came back negative. EKG as reviewed by me : EKG showed normal sinus rhythm with a rate of 79 with minimal voltage criteria for LVH and prolonged QT interval with QTc of 607 MS. Imaging: Two-view chest x-ray showed cardiomegaly with mild vascular congestion.  The patient was given 80 mg of IV Lasix and albuterol nebulizer 2.5 mg twice.  She will be admitted to a cardiac telemetry bed for further evaluation and management. PAST MEDICAL HISTORY:   Past Medical History:   Diagnosis Date  . Asthma   . Bell's palsy    fingers tingling in cold weather  . CHF (congestive heart failure) ([place])   . COPD (chronic obstructive pulmonary disease) ([place])   . Diabetes mellitus without complication ([place])   . Enlarged heart   . Hypertension   . Thyroid disease     PAST SURGICAL HISTORY:   Past Surgical History:  Procedure Laterality Date  . ABDOMINAL HYSTERECTOMY    . CHOLECYSTECTOMY    . IMPLANTABLE CARDIOVERTER DEFIBRILLATOR IMPLANT    . laparoscopic bilateral oophorectomy with removal of adnexal mass and lysis of adhesions  11/20/2017  . LAPAROSCOPY     with removal of adnexal structure    SOCIAL HISTORY:   Social History   Tobacco Use  . Smoking status: Every Day    Packs/day: 0.50    Types: Cigarettes  . Smokeless tobacco: Never  Substance Use Topics  . Alcohol use: Not Currently    Comment: rarely    FAMILY HISTORY:   Family History  Problem Relation Age of Onset  . Heart failure Father   . Heart attack Mother     DRUG ALLERGIES:   Allergies  Allergen Reactions  . Clindamycin/Lincomycin     Burning sensastion  . Amlodipine Other (See Comments) and Itching  . Penicillins Other (See Comments)    Has patient had a PCN reaction causing immediate rash, facial/tongue/throat swelling, SOB or lightheadedness with hypotension: Yes Has patient had a PCN reaction causing severe rash  involving mucus membranes or skin necrosis: No Has patient had a PCN reaction that required hospitalization: No Has patient had a PCN reaction occurring within the last 10 years: Yes If all of the above answers are "NO", then may proceed with Cephalosporin use.  Marland Kitchen Hydrochlorothiazide Rash    REVIEW OF SYSTEMS:   ROS As per history of present illness. All pertinent systems were reviewed above. Constitutional, HEENT, cardiovascular, respiratory, GI, GU, musculoskeletal, neuro, psychiatric, endocrine, integumentary and hematologic systems were reviewed and are  otherwise negative/unremarkable except for positive findings mentioned above in the HPI.   MEDICATIONS AT HOME:   Prior to Admission medications   Medication Sig Start Date End Date Taking? Authorizing Provider  ALPRAZolam Duanne Moron) 0.5 MG tablet Take 0.5 mg by mouth daily.    [provider]  aspirin EC 81 MG tablet Take 81 mg by mouth daily.     [provider]  blood glucose meter kit and supplies KIT Dispense based on patient and insurance preference. Use up to four times daily as directed. (FOR ICD-9 250.00, 250.01). 12/28/19   Nicole Kindred A, DO  carvedilol (COREG) 12.5 MG tablet Take 12.5 mg by mouth 2 (two) times daily.  01/31/19   [provider]  cetirizine (ZYRTEC) 10 MG tablet Take 10 mg by mouth daily.    [provider]  cyclobenzaprine (FLEXERIL) 10 MG tablet Take 1 tablet (10 mg total) by mouth 3 (three) times daily as needed. Beware sedation 03/15/22   Caryn Section Linden Dolin, PA-C  digoxin (LANOXIN) 0.125 MG tablet Take 0.125 mg by mouth daily.    [provider]  empagliflozin (JARDIANCE) 10 MG TABS tablet Take 1 tablet (10 mg total) by mouth daily before breakfast. 11/22/20   Alisa Graff, FNP  insulin aspart (NOVOLOG) 100 UNIT/ML FlexPen Inject 6 Units into the skin 3 (three) times daily with meals. 12/28/19   Ezekiel Slocumb, DO  Insulin Syringe-Needle U-100 (INSULIN SYRINGE .5CC/28G) 28G X 1/2" 0.5 ML MISC 1 each by Does not apply route 2 (two) times daily with a meal. Fill one syringe with 0.25 mL (25 units) of insulin twice daily with meals (breakfast and dinner). 12/28/19   Ezekiel Slocumb, DO  levothyroxine (SYNTHROID) 137 MCG tablet Take 137 mcg by mouth daily. 03/05/14   [provider]  Liniments (SALONPAS PAIN RELIEF PATCH EX) Apply 1 patch topically daily as needed (pain).    [provider]  meloxicam (MOBIC) 15 MG tablet Take 1 tablet (15 mg total) by mouth daily. 03/15/22 03/15/23  Fisher, Linden Dolin, PA-C   metFORMIN (GLUCOPHAGE-XR) 500 MG 24 hr tablet 500 mg. 02/01/20   [provider]  potassium chloride SA (K-DUR,KLOR-CON) 20 MEQ tablet Take 20 mEq by mouth 2 (two) times daily.  04/29/15   [provider]  sacubitril-valsartan (ENTRESTO) 24-26 MG Take 1 tablet by mouth 2 (two) times daily. 01/06/20   Alisa Graff, FNP  spironolactone (ALDACTONE) 25 MG tablet Take 0.5 tablets (12.5 mg total) by mouth daily. Patient taking differently: Take 25 mg by mouth daily. 09/06/20 12/05/20  Alisa Graff, FNP  torsemide (DEMADEX) 100 MG tablet TAKE 1/2 (ONE-HALF) TABLET BY MOUTH ONCE DAILY (DISCONTINUE  FUROSEMIDE) 11/12/20   Alisa Graff, FNP      VITAL SIGNS:  Blood pressure 130/77, pulse 71, temperature 98.3 F (36.8 C), resp. rate 20, height 5' 1"  (1.549 m), weight 72.6 kg, SpO2 95 %.  PHYSICAL EXAMINATION:  Physical Exam  GENERAL:  61 y.o.-year-old [place] American female patient lying in the bed with mild respiratory distress with conversational dyspnea. EYES: Pupils equal, round, reactive to light and accommodation. No scleral icterus. Extraocular muscles intact.  HEENT: Head atraumatic, normocephalic. Oropharynx and nasopharynx clear.  NECK:  Supple, no jugular venous distention. No thyroid enlargement, no tenderness.  LUNGS: Diminished bibasilar breath sounds with bibasilar rales.  No use of accessory muscles of respiration.  CARDIOVASCULAR: Regular rate and rhythm, S1, S2 normal. No murmurs, rubs, or gallops.  ABDOMEN: Soft, nondistended, nontender. Bowel sounds present. No organomegaly or mass.  EXTREMITIES: No pedal edema, cyanosis, or clubbing.  NEUROLOGIC: Cranial nerves II through XII are intact. Muscle strength 5/5 in all extremities. Sensation intact. Gait not checked.  PSYCHIATRIC: The patient is alert and oriented x 3.  Normal affect and good eye contact. SKIN: No obvious rash, lesion, or ulcer.   LABORATORY PANEL:   CBC Recent Labs  Lab 07/20/22 0413  WBC  7.1  HGB 14.0  HCT 43.7  PLT 115*   ------------------------------------------------------------------------------------------------------------------  Chemistries  Recent Labs  Lab 07/20/22 0004 07/20/22 0413  NA  --  139  K  --  3.6  CL  --  105  CO2  --  27  GLUCOSE  --  83  BUN  --  23*  CREATININE  --  1.33*  CALCIUM  --  8.5*  MG 1.9  --    ------------------------------------------------------------------------------------------------------------------  Cardiac Enzymes No results for input(s): "TROPONINI" in the last 168 hours. ------------------------------------------------------------------------------------------------------------------  RADIOLOGY:  DG Chest 2 View  Result Date: 07/19/2022 CLINICAL DATA:  Cough. EXAM: CHEST - 2 VIEW COMPARISON:  Chest radiograph dated 06/26/2020. FINDINGS: There is cardiomegaly with mild vascular congestion. No focal consolidation, pleural effusion, pneumothorax. Left pectoral AICD device. No acute osseous pathology. IMPRESSION: Cardiomegaly with mild vascular congestion. Electronically Signed   By: Anner Crete M.D.   On: 07/19/2022 18:11      IMPRESSION AND PLAN:  Assessment and Plan: * Acute on chronic systolic CHF (congestive heart failure) (HCC) - The patient will be admitted to a cardiac telemetry bed. - We will continue diuresis with IV Lasix. - We will follow serial troponins. - Last 2D echo revealed an EF 25 to 30% with mild to moderate left atrial dilatation. - We will continue Jardiance, Aldactone and Entresto.  AKI (acute kidney injury) ([place]) - This is likely prerenal due to acute CHF. - This is superimposed on CKD stage IIIa. - We will follow renal functions with diuresis.  COPD (chronic obstructive pulmonary disease) ([place]) - She will be placed on nebulized DuoNebs 4 times daily and every 4 hours as needed.  Type 2 diabetes mellitus with chronic kidney disease, with long-term current use of insulin  ([place]) - The patient will be placed on supplemental coverage with NovoLog. - We will continue basal coverage. - We will hold off metformin. - We will continue Jardiance.  Hypothyroidism - We will continue Synthroid.       DVT prophylaxis: Lovenox. Advanced Care Planning:  Code Status: DNI only.   Family Communication:  The plan of care was discussed in details with the patient (and family). I answered all questions. The patient agreed to proceed with the above mentioned plan. Further management will depend upon hospital course. Disposition Plan: Back to previous home environment Consults called: none. All the records are reviewed and case discussed with ED provider.  Status is: Inpatient    At the time of the admission, it appears that  the appropriate admission status for this patient is inpatient.  This is judged to be reasonable and necessary in order to provide the required intensity of service to ensure the patient's safety given the presenting symptoms, physical exam findings and initial radiographic and laboratory data in the context of comorbid conditions.  The patient requires inpatient status due to high intensity of service, high risk of further deterioration and high frequency of surveillance required.  I certify that at the time of admission, it is my clinical judgment that the patient will require inpatient hospital care extending more than 2 midnights.                            Dispo: The patient is from: Home              Anticipated d/c is to: Home              Patient currently is not medically stable to d/c.              Difficult to place patient: No  Christel Mormon M.D on 07/20/2022 at 5:53 AM  [facility]   From 7 PM-7 AM, contact night-coverage [email or web address]  CC: Primary care physician; Derinda Late, MD

## 2022-07-20 NOTE — ED Notes (Signed)
Pts O2 while sleeping decreased to 88-89%, Pt placed on 2L [place] - sats improved to 96%

## 2022-07-20 NOTE — Progress Notes (Signed)
Initial Nutrition Assessment  DOCUMENTATION CODES:   Obesity unspecified  INTERVENTION:   -Ensure Enlive po BID, each supplement provides 350 kcal and 20 grams of protein -MVI with minerals daily -Liberalize diet to 2 gram sodium for wider variety of meal selections -RD provided "Low Sodium Nutrition Therapy" handout from [facility] Nutrition Care Manual; attached to AVS/ discharge summary   NUTRITION DIAGNOSIS:   Increased nutrient needs related to chronic illness (CHF) as evidenced by estimated needs.  GOAL:   Patient will meet greater than or equal to 90% of their needs  MONITOR:   PO intake, Supplement acceptance  REASON FOR ASSESSMENT:   Rounds    ASSESSMENT:   Pt with medical history significant for asthma, CHF, COPD, type diabetes mellitus and hypertension as well as hypothyroidism, who presented with acute onset of dyspnea with associated orthopnea and paroxysmal nocturnal dyspnea as well as dyspnea on exertion of the last week.  Pt admitted with CHF.   Reviewed I/O's: -600 ml x 24 hours  UOP: 600 ml x 24 hours  Pt unavailable at time of visit. RD unable to obtain further nutrition-related history or complete nutrition-focused physical exam at this time.    Pt currently on a heart healthy diet. No meal completion data available to assess at this time.   Reviewed wt hx; pt has experienced a 3.7% wt loss over the past 4 months, which is not significant for time frame.    Medications reviewed and include lasix, potassium chloride, and aldactone.   Lab Results  Component Value Date   HGBA1C 5.4 09/20/2020   PTA DM medications are 10 mg jardiance daily, 6 units insulin aspart TID with meals, and 500 mg metformin daily.   Labs reviewed: CBGS: 161 (inpatient orders for glycemic control are none).    Diet Order:   Diet Order             Diet Heart Room service appropriate? Yes; Fluid consistency: Thin  Diet effective now                   EDUCATION  NEEDS:   No education needs have been identified at this time  Skin:  Skin Assessment: Reviewed RN Assessment  Last BM:  Unknown  Height:   Ht Readings from Last 1 Encounters:  07/19/22 5\' 1"  (1.549 m)    Weight:   Wt Readings from Last 1 Encounters:  07/19/22 72.6 kg    Ideal Body Weight:  47.7 kg  BMI:  Body mass index is 30.23 kg/m.  Estimated Nutritional Needs:   Kcal:  1500-1700  Protein:  75-90 grams  Fluid:  > 1.5 L    Loistine Chance, RD, LDN, [place] Registered Dietitian II Certified Diabetes Care and Education Specialist Please refer to [facility] for RD and/or RD on-call/weekend/after hours pager

## 2022-07-20 NOTE — Progress Notes (Signed)
PROGRESS NOTE    Heather Choi  RJJ:[id number] DOB: October 31, 1960 DOA: 07/19/2022 PCP: Derinda Late, MD   Brief Narrative: 61 year old with past medical history significant for asthma, systolic heart failure ejection fraction 25%, COPD, diabetes type 2, hypertension, hypothyroidism who presents complaining of acute onset dyspnea associated orthopnea, paroxysmal nocturnal dyspnea, weight gain over the last year, abdominal distention.  Evaluation in the ED chest x-ray with vascular congestion, BNP 63,.  Admitted for heart failure exacerbation, received IV Lasix.   Assessment & Plan:   Principal Problem:   Acute on chronic systolic CHF (congestive heart failure) (HCC) Active Problems:   AKI (acute kidney injury) ([place])   COPD (chronic obstructive pulmonary disease) (HCC)   Type 2 diabetes mellitus with chronic kidney disease, with long-term current use of insulin (HCC)   Hypothyroidism   Diabetic gastroparesis (HCC)  1-Acute on chronic systolic heart failure exacerbation: Aortic valve regurgitation moderate -Presented with shortness of breath, orthopnea, weight gain over the last year, worsening abdominal distention over the last couple of days, admitted for heart failure exacerbation although BNP not significantly elevated. -Continue with IV Lasix. -Hold Entresto until evaluated by cardiology -Plan to repeat a 2D echo -Cardiology has been consulted -Daily weight, strict I's and O's  2-COPD, also component of acute exacerbation: Continue with nebulizers, continue with guaifenesin Flutter valve Smoking cessation discussed  3-Dizziness: Unable to perform MRI as he has defibrillator We will proceed with CT head; negative  Check B 12.  Might be related to orthostatic hypotension vs vestibular dysfunction.   4-Diabetes type 2 associated with CKD long-term use of insulin Continue with sliding scale, hold metformin.  Continue with Jardiance  Hypothyroidism: Continue with  Synthroid Mild elevation troponin; in setting HF exacerbation.    Nutrition Problem: Increased nutrient needs Etiology: chronic illness (CHF)    Signs/Symptoms: estimated needs    Interventions: Ensure Enlive (each supplement provides 350kcal and 20 grams of protein), MVI, Liberalize Diet  Estimated body mass index is 30.23 kg/m as calculated from the following:   Height as of this encounter: 5\' 1"  (1.549 m).   Weight as of this encounter: 72.6 kg.   DVT prophylaxis: Lovenox Code Status: Full code, wishes to be full code now.  Family Communication: discussed with son over phone  Disposition Plan:  Status is: Inpatient Remains inpatient appropriate because: remain inpatient for HF exacerbation.     Consultants:  Cardiology  Procedures:  ECHO  Antimicrobials:    Subjective: She report SOB, cough. Also complaints of dizziness.   Objective: Vitals:   07/20/22 0005 07/20/22 0315 07/20/22 0635 07/20/22 0756  BP: 122/78 130/77    Pulse: 80 71    Resp: 18 20    Temp: 98.3 F (36.8 C) 98.3 F (36.8 C) 98.4 F (36.9 C) 97.8 F (36.6 C)  TempSrc: Oral  Oral Oral  SpO2: 96% 95%    Weight:      Height:        Intake/Output Summary (Last 24 hours) at 07/20/2022 0922 Last data filed at 07/20/2022 0426 Gross per 24 hour  Intake --  Output 600 ml  Net -600 ml   Filed Weights   07/19/22 1728  Weight: 72.6 kg    Examination:  General exam: Appears calm and comfortable  Respiratory system: Clear to auscultation. Respiratory effort normal. Cardiovascular system: S1 & S2 heard, RRR. No JVD, murmurs, rubs, gallops or clicks. No pedal edema. Gastrointestinal system: Abdomen is nondistended, soft and nontender. No organomegaly or masses felt. Normal  bowel sounds heard. Central nervous system: Alert and oriented. No focal neurological deficits. Extremities: Symmetric 5 x 5 power.   Data Reviewed: I have personally reviewed following labs and imaging  studies  CBC: Recent Labs  Lab 07/19/22 2236 07/20/22 0413  WBC 7.4 7.1  NEUTROABS 4.7  --   HGB 14.1 14.0  HCT 44.8 43.7  MCV 86.2 86.5  PLT 145* 115*   Basic Metabolic Panel: Recent Labs  Lab 07/19/22 2236 07/20/22 0004 07/20/22 0413  NA 141  --  139  K 3.5  --  3.6  CL 108  --  105  CO2 27  --  27  GLUCOSE 99  --  83  BUN 21*  --  23*  CREATININE 1.42*  --  1.33*  CALCIUM 8.9  --  8.5*  MG  --  1.9  --    GFR: Estimated Creatinine Clearance: 41 mL/min (A) (by C-G formula based on SCr of 1.33 mg/dL (H)). Liver Function Tests: No results for input(s): "AST", "ALT", "ALKPHOS", "BILITOT", "PROT", "ALBUMIN" in the last 168 hours. No results for input(s): "LIPASE", "AMYLASE" in the last 168 hours. No results for input(s): "AMMONIA" in the last 168 hours. Coagulation Profile: No results for input(s): "INR", "PROTIME" in the last 168 hours. Cardiac Enzymes: No results for input(s): "CKTOTAL", "CKMB", "CKMBINDEX", "TROPONINI" in the last 168 hours. BNP (last 3 results) No results for input(s): "PROBNP" in the last 8760 hours. HbA1C: No results for input(s): "HGBA1C" in the last 72 hours. CBG: No results for input(s): "GLUCAP" in the last 168 hours. Lipid Profile: No results for input(s): "CHOL", "HDL", "LDLCALC", "TRIG", "CHOLHDL", "LDLDIRECT" in the last 72 hours. Thyroid Function Tests: No results for input(s): "TSH", "T4TOTAL", "FREET4", "T3FREE", "THYROIDAB" in the last 72 hours. Anemia Panel: No results for input(s): "VITAMINB12", "FOLATE", "FERRITIN", "TIBC", "IRON", "RETICCTPCT" in the last 72 hours. Sepsis Labs: No results for input(s): "PROCALCITON", "LATICACIDVEN" in the last 168 hours.  Recent Results (from the past 240 hour(s))  SARS Coronavirus 2 by RT PCR (hospital order, performed in [facility] hospital lab) *cepheid single result test* Anterior Nasal Swab     Status: None   Collection Time: 07/19/22  5:26 PM   Specimen: Anterior Nasal Swab  Result  Value Ref Range Status   SARS Coronavirus 2 by RT PCR NEGATIVE NEGATIVE Final    Comment: (NOTE) SARS-CoV-2 target nucleic acids are NOT DETECTED.  The SARS-CoV-2 RNA is generally detectable in upper and lower respiratory specimens during the acute phase of infection. The lowest concentration of SARS-CoV-2 viral copies this assay can detect is 250 copies / mL. A negative result does not preclude SARS-CoV-2 infection and should not be used as the sole basis for treatment or other patient management decisions.  A negative result may occur with improper specimen collection / handling, submission of specimen other than nasopharyngeal swab, presence of viral mutation(s) within the areas targeted by this assay, and inadequate number of viral copies (<250 copies / mL). A negative result must be combined with clinical observations, patient history, and epidemiological information.  Fact Sheet for Patients:   RoadLapTop.co.za  Fact Sheet for Healthcare Providers: http://kim-miller.com/  This test is not yet approved or  cleared by the [place] FDA and has been authorized for detection and/or diagnosis of SARS-CoV-2 by FDA under an Emergency Use Authorization (EUA).  This EUA will remain in effect (meaning this test can be used) for the duration of the COVID-19 declaration under Section 564(b)(1) of  the Act, 21 U.S.C. section 360bbb-3(b)(1), unless the authorization is terminated or revoked sooner.  Performed at [facility], [address], [place], [place] [address]          Radiology Studies: DG Chest 2 View  Result Date: 07/19/2022 CLINICAL DATA:  Cough. EXAM: CHEST - 2 VIEW COMPARISON:  Chest radiograph dated 06/26/2020. FINDINGS: There is cardiomegaly with mild vascular congestion. No focal consolidation, pleural effusion, pneumothorax. Left pectoral AICD device. No acute osseous pathology. IMPRESSION: Cardiomegaly  with mild vascular congestion. Electronically Signed   By: Elgie Collard M.D.   On: 07/19/2022 18:11        Scheduled Meds:  aspirin EC  81 mg Oral Daily   carvedilol  12.5 mg Oral BID   digoxin  0.125 mg Oral Daily   empagliflozin  10 mg Oral QAC breakfast   enoxaparin (LOVENOX) injection  40 mg Subcutaneous Q24H   feeding supplement  237 mL Oral BID BM   furosemide  40 mg Intravenous Daily   guaiFENesin  600 mg Oral BID   ipratropium-albuterol  3 mL Nebulization QID   levothyroxine  137 mcg Oral Q0600   loratadine  10 mg Oral Daily   multivitamin with minerals  1 tablet Oral Daily   nicotine  14 mg Transdermal Daily   potassium chloride SA  20 mEq Oral BID   spironolactone  25 mg Oral Daily   Continuous Infusions:   LOS: 0 days    Time spent: 35 minutes    Maleta Pacha A Jonny Longino, MD [facility]   If 7PM-7AM, please contact night-coverage [email or web address]  07/20/2022, 9:22 AM

## 2022-07-20 NOTE — Evaluation (Signed)
Physical Therapy Evaluation Patient Details Name: Heather Choi MRN: [id number] DOB: 03-Dec-1960 Today's Date: 07/20/2022  History of Present Illness  61 y.o. African-American female with medical history significant for asthma, CHF, COPD, type diabetes mellitus and hypertension as well as hypothyroidism, who presented to the emergency room with acute onset of dyspnea with associated orthopnea and paroxysmal nocturnal dyspnea as well as dyspnea on exertion.  Clinical Impression  Pt did well and was independent with mobility, she did not appear to have BPPV but did appear to have some vestibular hypofunction as well as have significant orthostatic BP changes concurrent with low grade dizziness.  She was able to ambulate w/o AD with appropriate speed but did have some mild unsteadiness with challenges of head turns, stopping and turning and marching at speed.  From PT stand-point pt is safe to return home but per continued symptoms may benefit from outpt vestibular PT.       Recommendations for follow up therapy are one component of a multi-disciplinary discharge planning process, led by the attending physician.  Recommendations may be updated based on patient status, additional functional criteria and insurance authorization.  Follow Up Recommendations Outpatient PT (vestibular)      Assistance Recommended at Discharge Set up Supervision/Assistance  Patient can return home with the following       Equipment Recommendations None recommended by PT (O2 per medical progress)  Recommendations for Other Services       Functional Status Assessment Patient has had a recent decline in their functional status and demonstrates the ability to make significant improvements in function in a reasonable and predictable amount of time.     Precautions / Restrictions Precautions Precautions: Fall (moderate) Restrictions Weight Bearing Restrictions: No      Mobility  Bed Mobility Overal bed mobility:  Independent             General bed mobility comments: Pt easily able to get in/out of bed w/o assist    Transfers Overall transfer level: Modified independent Equipment used: None               General transfer comment: Pt with c/o mild dizziness with sit to stand but did not need any assist    Ambulation/Gait Ambulation/Gait assistance: Supervision Gait Distance (Feet): 300 Feet Assistive device: None         General Gait Details: Pt was able to do prolonged bout of ambulation in ED hallway, she did well with speed changes, vertical head movements. Did have some unsteadiness/low grade stagger stepping with lateral head turns, high step marching and stop/180* turns though she was never near falling or needing direct assist to maintain  Stairs            Wheelchair Mobility    Modified Rankin (Stroke Patients Only)       Balance                                             Pertinent Vitals/Pain Pain Assessment Pain Assessment: No/denies pain    Home Living Family/patient expects to be discharged to:: Private residence Living Arrangements: Other relatives Available Help at Discharge: Family   Home Access: Level entry     Alternate Level Stairs-Number of Steps: flight Home Layout: Two level Home Equipment: None Additional Comments: Pt reports that she take care of w/c bound 61 year old  Prior Function Prior Level of Function : Independent/Modified Independent;Working/employed Theatre stage manager, on feet all day)             Mobility Comments: Pt does not need AD, reports standing >8 hrs/day at work ADLs Comments: independent     Higher education careers adviser        Extremity/Trunk Assessment   Upper Extremity Assessment Upper Extremity Assessment: Overall WFL for tasks assessed    Lower Extremity Assessment Lower Extremity Assessment: Overall WFL for tasks assessed       Communication   Communication: No difficulties  Cognition  Arousal/Alertness: Awake/alert Behavior During Therapy: WFL for tasks assessed/performed Overall Cognitive Status: Within Functional Limits for tasks assessed                                          General Comments General comments (skin integrity, edema, etc.): Pt's with orthostatic BP changes:  117/77 in supine, 110/58 sitting, 87/75 standing, 97/69 standing after a few minutes - nursing notified.  Pt negative for Dix-Hallpike BPPV testing - subjective reports of dizziness more lightheadeness and eye focusing than true short-lived vertigo.  Pt needed supplemental O2 t/o the effort with some DOE and sats remaining in mid/low 90s.    Exercises     Assessment/Plan    PT Assessment Patient needs continued PT services  PT Problem List Decreased activity tolerance;Decreased balance;Cardiopulmonary status limiting activity       PT Treatment Interventions Gait training;Functional mobility training;Stair training;Therapeutic exercise;Balance training;Patient/family education;Therapeutic activities;Neuromuscular re-education    PT Goals (Current goals can be found in the Care Plan section)  Acute Rehab PT Goals Patient Stated Goal: get breathing better PT Goal Formulation: With patient Time For Goal Achievement: 08/02/22 Potential to Achieve Goals: Good    Frequency Min 2X/week     Co-evaluation               AM-PAC PT "6 Clicks" Mobility  Outcome Measure Help needed turning from your back to your side while in a flat bed without using bedrails?: None Help needed moving from lying on your back to sitting on the side of a flat bed without using bedrails?: None Help needed moving to and from a bed to a chair (including a wheelchair)?: None Help needed standing up from a chair using your arms (e.g., wheelchair or bedside chair)?: None Help needed to walk in hospital room?: None Help needed climbing 3-5 steps with a railing? : A Little 6 Click Score: 23     End of Session       Nurse Communication: Mobility status (BP, O2) PT Visit Diagnosis: Unsteadiness on feet (R26.81);Other abnormalities of gait and mobility (R26.89)    Time: 1130-1157 PT Time Calculation (min) (ACUTE ONLY): 27 min   Charges:   PT Evaluation $PT Eval Low Complexity: 1 Low PT Treatments $Therapeutic Activity: 8-22 mins        Malachi Pro, DPT 07/20/2022, 2:33 PM

## 2022-07-20 NOTE — Assessment & Plan Note (Addendum)
-   The patient will be admitted to a cardiac telemetry bed. - We will continue diuresis with IV Lasix. - We will follow serial troponins. - Last 2D echo revealed an EF 25 to 30% with mild to moderate left atrial dilatation. - We will continue Jardiance, Aldactone and Entresto.

## 2022-07-20 NOTE — Discharge Instructions (Signed)

## 2022-07-20 NOTE — Assessment & Plan Note (Signed)
-   She will be placed on nebulized DuoNebs 4 times daily and every 4 hours as needed.

## 2022-07-20 NOTE — Assessment & Plan Note (Addendum)
-   This is likely prerenal due to acute CHF. - This is superimposed on CKD stage IIIa. - We will follow renal functions with diuresis.

## 2022-07-20 NOTE — Assessment & Plan Note (Signed)
-   The patient will be placed on supplemental coverage with NovoLog. - We will continue basal coverage. - We will hold off metformin. - We will continue Jardiance.

## 2022-07-20 NOTE — ED Notes (Signed)
Per previous RN pt was on O2 overnight. O2 turned off at this time, pt remains 94-95% room air and tolerating well

## 2022-07-21 ENCOUNTER — Inpatient Hospital Stay
Admit: 2022-07-21 | Discharge: 2022-07-21 | Disposition: A | Discharge status: Home / Self Care | Payer: BC Managed Care – PPO | Attending: Cardiology | Admitting: Cardiology

## 2022-07-21 DIAGNOSIS — I5023 Acute on chronic systolic (congestive) heart failure: Secondary | ICD-10-CM | POA: Diagnosis not present

## 2022-07-21 LAB — ECHOCARDIOGRAM COMPLETE
AR max vel: 2.33 cm2
AV Area VTI: 2.09 cm2
AV Area mean vel: 2.16 cm2
AV Mean grad: 4 mmHg
AV Peak grad: 8.4 mmHg
Ao pk vel: 1.45 m/s
Area-P 1/2: 5.13 cm2
Calc EF: 21.9 %
Height: 61 in
S' Lateral: 4.9 cm
Single Plane A2C EF: 22.6 %
Single Plane A4C EF: 28.4 %
Weight: 2560 oz

## 2022-07-21 LAB — BASIC METABOLIC PANEL
Anion gap: 9 (ref 5–15)
BUN: 32 mg/dL — ABNORMAL HIGH (ref 6–20)
CO2: 31 mmol/L (ref 22–32)
Calcium: 8.6 mg/dL — ABNORMAL LOW (ref 8.9–10.3)
Chloride: 98 mmol/L (ref 98–111)
Creatinine, Ser: 1.33 mg/dL — ABNORMAL HIGH (ref 0.44–1.00)
GFR, Estimated: 46 mL/min — ABNORMAL LOW (ref >60)
Glucose, Bld: 145 mg/dL — ABNORMAL HIGH (ref 70–99)
Potassium: 3.9 mmol/L (ref 3.5–5.1)
Sodium: 138 mmol/L (ref 135–145)

## 2022-07-21 LAB — HIV ANTIBODY (ROUTINE TESTING W REFLEX): HIV Screen 4th Generation wRfx: NONREACTIVE

## 2022-07-21 MED ORDER — PERFLUTREN LIPID MICROSPHERE
1.0000 mL | INTRAVENOUS | Status: AC | PRN
  Administered 2022-07-21: 3 mL via INTRAVENOUS

## 2022-07-21 MED ORDER — FLUTICASONE PROPIONATE 50 MCG/ACT NA SUSP
1.0000 | Freq: Every day | NASAL | Status: DC
  Administered 2022-07-21 – 2022-07-24 (×4): 1 via NASAL
  Filled 2022-07-21: qty 16

## 2022-07-21 MED ORDER — SODIUM CHLORIDE 0.9 % IV SOLN
1.0000 g | INTRAVENOUS | Status: DC
  Administered 2022-07-21 – 2022-07-23 (×3): 1 g via INTRAVENOUS
  Filled 2022-07-21 (×4): qty 10

## 2022-07-21 MED ORDER — SODIUM CHLORIDE 0.9 % IV SOLN
500.0000 mg | INTRAVENOUS | Status: DC
  Administered 2022-07-21 – 2022-07-22 (×2): 500 mg via INTRAVENOUS
  Filled 2022-07-21 (×3): qty 5

## 2022-07-21 NOTE — Progress Notes (Signed)
*  PRELIMINARY RESULTS* Echocardiogram 2D Echocardiogram has been performed.  Heather Choi 07/21/2022, 10:57 AM
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
## 2022-07-21 NOTE — ED Notes (Signed)
Pt's visitor returned with another visitor. Pt's room now smells of marijuana. Pt cleaned self up with towels and wash cloth. Pt changed into own pajamas. Pt's monitors put back in place.
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
## 2022-07-21 NOTE — Consult Note (Signed)
   Heart Failure Nurse Navigator Note  HFrEF 25-30%.  Left ventricular cavity is moderately to severely dilated.  Left ventricular diastolic parameters are indeterminate.  Right ventricular systolic function is normal.  Left atria is mild to moderately dilated.  Aortic valve with moderate regurgitation.  Echocardiogram results pending on this admission.  She presented to the emergency room with complaints of acute onset of dyspnea, orthopnea, PND as well as dyspnea on exertion.  She had also noted abdominal distention without lower extremity edema.  Chest x-ray revealed cardiomegaly with mild vascular congestion.  She has a history of postpartum cardiomyopathy.  Comorbidities:  COPD Continued tobacco abuse Diabetes Graves' disease status post subtotal thyroidectomy PSVT status post successful ablation Hypertension Insertion of dual-chamber ICD  Medications:  Aspirin 81 mg daily Carvedilol 12 and half milligrams twice a day Levothyroxine 137 mcg daily NicoDerm patch 14 mg daily Torsemide 50 mg twice a day  Labs:  Sodium 138, potassium 3.9, chloride 98, CO2 31, BUN 32, creatinine 1.33, BNP 63, Weight is 72.6 kg Blood pressure 114/70  Met with patient in the ED, she is currently lying on the gurney having just finished echocardiogram.  Continues to have a congested sounding cough.  Went over how she takes care of herself at home.  She states that she is compliant with her medications.  She also states that she weighs herself daily.  She feels that her dry weight is 160 pounds.  He had noted it increasing.  Also went over sodium restriction along with fluid restriction.  She states that she does not drink over 32 ounces daily, this includes a 16 ounce bottle of water which she sips on all day and a 16 ounce bottle of soda.   She had questions about the use of spironolactone, answered  her question.  She has follow-up in the outpatient heart failure clinic on October 30 at 3:30 in  the afternoon.  She had no further questions.  Pricilla Riffle RN [facility]

## 2022-07-22 DIAGNOSIS — I5023 Acute on chronic systolic (congestive) heart failure: Secondary | ICD-10-CM | POA: Diagnosis not present

## 2022-07-22 MED ORDER — CARVEDILOL 6.25 MG PO TABS
6.2500 mg | ORAL_TABLET | Freq: Two times a day (BID) | ORAL | Status: DC
  Administered 2022-07-22 – 2022-07-24 (×4): 6.25 mg via ORAL
  Filled 2022-07-22 (×4): qty 1

## 2022-07-22 MED ORDER — IPRATROPIUM-ALBUTEROL 0.5-2.5 (3) MG/3ML IN SOLN
3.0000 mL | Freq: Three times a day (TID) | RESPIRATORY_TRACT | Status: DC
  Administered 2022-07-22 – 2022-07-23 (×3): 3 mL via RESPIRATORY_TRACT
  Filled 2022-07-22 (×4): qty 3

## 2022-07-22 NOTE — Progress Notes (Signed)
SATURATION QUALIFICATIONS: (This note is used to comply with regulatory documentation for home oxygen)  Patient Saturations on Room Air at Rest = 90%  Patient Saturations on Room Air while Ambulating = 84%  Patient Saturations on 3 Liters of oxygen while Ambulating = 94%  Please briefly explain why patient needs home oxygen: to decrease shortness of breath and maintain sat >90%

## 2022-07-22 NOTE — Progress Notes (Signed)
SUBJECTIVE: Patient resting comfortably in bed. Complains of dizziness and shortness of breath.    Vitals:   07/22/22 0556 07/22/22 0803 07/22/22 0826 07/22/22 1146  BP:  116/68  103/61  Pulse:  73 75 72  Resp:  18 18 19   Temp:  97.9 F (36.6 C)  98.2 F (36.8 C)  TempSrc:      SpO2:  95% 95% 95%  Weight: 71.9 kg     Height:       No intake or output data in the 24 hours ending 07/22/22 1310  LABS: Basic Metabolic Panel: Recent Labs    07/20/22 0004 07/20/22 0413 07/21/22 0551  NA  --  139 138  K  --  3.6 3.9  CL  --  105 98  CO2  --  27 31  GLUCOSE  --  83 145*  BUN  --  23* 32*  CREATININE  --  1.33* 1.33*  CALCIUM  --  8.5* 8.6*  MG 1.9  --   --    Liver Function Tests: No results for input(s): "AST", "ALT", "ALKPHOS", "BILITOT", "PROT", "ALBUMIN" in the last 72 hours. No results for input(s): "LIPASE", "AMYLASE" in the last 72 hours. CBC: Recent Labs    07/19/22 2236 07/20/22 0413  WBC 7.4 7.1  NEUTROABS 4.7  --   HGB 14.1 14.0  HCT 44.8 43.7  MCV 86.2 86.5  PLT 145* 115*   Cardiac Enzymes: No results for input(s): "CKTOTAL", "CKMB", "CKMBINDEX", "TROPONINI" in the last 72 hours. BNP: Invalid input(s): "POCBNP" D-Dimer: No results for input(s): "DDIMER" in the last 72 hours. Hemoglobin A1C: No results for input(s): "HGBA1C" in the last 72 hours. Fasting Lipid Panel: No results for input(s): "CHOL", "HDL", "LDLCALC", "TRIG", "CHOLHDL", "LDLDIRECT" in the last 72 hours. Thyroid Function Tests: No results for input(s): "TSH", "T4TOTAL", "T3FREE", "THYROIDAB" in the last 72 hours.  Invalid input(s): "FREET3" Anemia Panel: Recent Labs    07/20/22 1016  VITAMINB12 539     PHYSICAL EXAM General: Well developed, well nourished, in no acute distress HEENT:  Normocephalic and atramatic Neck:  No JVD.  Lungs: Clear bilaterally to auscultation and percussion. Heart: HRRR . Normal S1 and S2 without gallops or murmurs.  Abdomen: Bowel sounds are  positive, abdomen soft and non-tender  Msk:  Back normal, normal gait. Normal strength and tone for age. Extremities: No clubbing, cyanosis or edema.   Neuro: Alert and oriented X 3. Psych:  Good affect, responds appropriately  TELEMETRY: sinus rhythm, HR 73 bpm  ASSESSMENT AND PLAN: Patient laying comfortably in bed. Patient complains of dizziness. B/p low. Recommend decreasing carvedilol to 6.25 mg twice daily. Will continue to follow.   Principal Problem:   Acute on chronic systolic CHF (congestive heart failure) (HCC) Active Problems:   COPD (chronic obstructive pulmonary disease) (HCC)   Hypothyroidism   AKI (acute kidney injury) ([place])   Type 2 diabetes mellitus with chronic kidney disease, with long-term current use of insulin ([place])   Diabetic gastroparesis ([place])    Sterlin Knightly, FNP-C 07/22/2022 1:10 PM

## 2022-07-22 NOTE — Progress Notes (Signed)
PROGRESS NOTE    Heather Choi  [id number] DOB: 10-07-1960 DOA: 07/19/2022 PCP: Heather Late, MD   Brief Narrative: 61 year old with past medical history significant for asthma, systolic heart failure ejection fraction 25%, COPD, diabetes type 2, hypertension, hypothyroidism who presents complaining of acute onset dyspnea associated orthopnea, paroxysmal nocturnal dyspnea, weight gain over the last year, abdominal distention.  Evaluation in the ED chest x-ray with vascular congestion, BNP 63,.  Admitted for heart failure exacerbation, received IV Lasix.   Assessment & Plan:   Principal Problem:   Acute on chronic systolic CHF (congestive heart failure) (HCC) Active Problems:   AKI (acute kidney injury) (Central)   COPD (chronic obstructive pulmonary disease) (HCC)   Type 2 diabetes mellitus with chronic kidney disease, with long-term current use of insulin (HCC)   Hypothyroidism   Diabetic gastroparesis (HCC)  1-Acute on chronic systolic heart failure exacerbation: Aortic valve regurgitation moderate -Presented with shortness of breath, orthopnea, weight gain over the last year, worsening abdominal distention over the last couple of days, admitted for heart failure exacerbation although BNP not significantly elevated. -Treated Initially with  IV Lasix.  -Hold Entresto, and spironolactone. She was not taking spironolactone.  -2D echo: Ef 20 %, grade 3 diastolic dysfunction, right ventricular systolic function severely reduced. FU cardiology rec -Cardiology has been consulted. Started torsemide.  -Daily weight, strict I's and O's: weight: 160--158 -Carvedilol dose reduce due to orthostatic hypotension.    2-COPD, also component of acute exacerbation: Acute Hypoxic respiratory failure. Requiring 3 L oxygen. Oxygen down to 84 on RA.  Continue with nebulizers, continue with guaifenesin Flutter valve Smoking cessation discussed Rhino virus respiratory panel. Support care.  Started   IV antibiotics to cover for super impose Bacterial infection.  She will need oxygen at discharge.   3-Dizziness: Unable to perform MRI as he has defibrillator. CT head; negative  B 12: 539. Might be related to orthostatic hypotension vs vestibular dysfunction.  Plan to recheck BP>   4-Diabetes type 2 associated with CKD long-term use of insulin Continue with sliding scale, hold metformin.  Continue with Jardiance.  Hypothyroidism: Continue with Synthroid. Mild elevation troponin; In setting HF exacerbation.    Nutrition Problem: Increased nutrient needs Etiology: chronic illness (CHF)    Signs/Symptoms: estimated needs    Interventions: Ensure Enlive (each supplement provides 350kcal and 20 grams of protein), MVI, Liberalize Diet  Estimated body mass index is 29.95 kg/m as calculated from the following:   Height as of this encounter: 5\' 1"  (1.549 m).   Weight as of this encounter: 71.9 kg.   DVT prophylaxis: Lovenox Code Status: Full code, wishes to be full code now.  Family Communication: discussed with son over phone  Disposition Plan:  Status is: Inpatient Remains inpatient appropriate because: remain inpatient for HF exacerbation.     Consultants:  Cardiology  Procedures:  ECHO  Antimicrobials:    Subjective: She report dizziness, cough and dyspnea improved.   Objective: Vitals:   07/22/22 0556 07/22/22 0803 07/22/22 0826 07/22/22 1146  BP:  116/68  103/61  Pulse:  73 75 72  Resp:  18 18 19   Temp:  97.9 F (36.6 C)  98.2 F (36.8 C)  TempSrc:      SpO2:  95% 95% 95%  Weight: 71.9 kg     Height:       No intake or output data in the 24 hours ending 07/22/22 1355  Filed Weights   07/19/22 1728 07/22/22 0556  Weight: 72.6 kg  71.9 kg    Examination:  General exam: NAD Respiratory system: BL ronchus Cardiovascular system: S 1, S 2 RRR Gastrointestinal system: BS present, soft, nt Central nervous system: Alert, follows  command Extremities: No edema   Data Reviewed: I have personally reviewed following labs and imaging studies  CBC: Recent Labs  Lab 07/19/22 2236 07/20/22 0413  WBC 7.4 7.1  NEUTROABS 4.7  --   HGB 14.1 14.0  HCT 44.8 43.7  MCV 86.2 86.5  PLT 145* 115*    Basic Metabolic Panel: Recent Labs  Lab 07/19/22 2236 07/20/22 0004 07/20/22 0413 07/21/22 0551  NA 141  --  139 138  K 3.5  --  3.6 3.9  CL 108  --  105 98  CO2 27  --  27 31  GLUCOSE 99  --  83 145*  BUN 21*  --  23* 32*  CREATININE 1.42*  --  1.33* 1.33*  CALCIUM 8.9  --  8.5* 8.6*  MG  --  1.9  --   --     GFR: Estimated Creatinine Clearance: 40.8 mL/min (A) (by C-G formula based on SCr of 1.33 mg/dL (H)). Liver Function Tests: No results for input(s): "AST", "ALT", "ALKPHOS", "BILITOT", "PROT", "ALBUMIN" in the last 168 hours. No results for input(s): "LIPASE", "AMYLASE" in the last 168 hours. No results for input(s): "AMMONIA" in the last 168 hours. Coagulation Profile: No results for input(s): "INR", "PROTIME" in the last 168 hours. Cardiac Enzymes: No results for input(s): "CKTOTAL", "CKMB", "CKMBINDEX", "TROPONINI" in the last 168 hours. BNP (last 3 results) No results for input(s): "PROBNP" in the last 8760 hours. HbA1C: No results for input(s): "HGBA1C" in the last 72 hours. CBG: No results for input(s): "GLUCAP" in the last 168 hours. Lipid Profile: No results for input(s): "CHOL", "HDL", "LDLCALC", "TRIG", "CHOLHDL", "LDLDIRECT" in the last 72 hours. Thyroid Function Tests: No results for input(s): "TSH", "T4TOTAL", "FREET4", "T3FREE", "THYROIDAB" in the last 72 hours. Anemia Panel: Recent Labs    07/20/22 1016  VITAMINB12 539    Sepsis Labs: No results for input(s): "PROCALCITON", "LATICACIDVEN" in the last 168 hours.  Recent Results (from the past 240 hour(s))  SARS Coronavirus 2 by RT PCR (hospital order, performed in [facility] hospital lab) *cepheid single result test* Anterior  Nasal Swab     Status: None   Collection Time: 07/19/22  5:26 PM   Specimen: Anterior Nasal Swab  Result Value Ref Range Status   SARS Coronavirus 2 by RT PCR NEGATIVE NEGATIVE Final    Comment: (NOTE) SARS-CoV-2 target nucleic acids are NOT DETECTED.  The SARS-CoV-2 RNA is generally detectable in upper and lower respiratory specimens during the acute phase of infection. The lowest concentration of SARS-CoV-2 viral copies this assay can detect is 250 copies / mL. A negative result does not preclude SARS-CoV-2 infection and should not be used as the sole basis for treatment or other patient management decisions.  A negative result may occur with improper specimen collection / handling, submission of specimen other than nasopharyngeal swab, presence of viral mutation(s) within the areas targeted by this assay, and inadequate number of viral copies (<250 copies / mL). A negative result must be combined with clinical observations, patient history, and epidemiological information.  Fact Sheet for Patients:   https://www.patel.info/  Fact Sheet for Healthcare Providers: https://hall.com/  This test is not yet approved or  cleared by the [place] FDA and has been authorized for detection and/or diagnosis of SARS-CoV-2 by FDA  under an Emergency Use Authorization (EUA).  This EUA will remain in effect (meaning this test can be used) for the duration of the COVID-19 declaration under Section 564(b)(1) of the Act, 21 U.S.C. section 360bbb-3(b)(1), unless the authorization is terminated or revoked sooner.  Performed at [facility], [place], [place] [address]   Respiratory (~20 pathogens) panel by PCR     Status: Abnormal   Collection Time: 07/20/22  4:34 PM   Specimen: Nasopharyngeal Swab; Respiratory  Result Value Ref Range Status   Adenovirus NOT DETECTED NOT DETECTED Final   Coronavirus 229E NOT DETECTED NOT  DETECTED Final    Comment: (NOTE) The Coronavirus on the Respiratory Panel, DOES NOT test for the novel  Coronavirus (2019 nCoV)    Coronavirus HKU1 NOT DETECTED NOT DETECTED Final   Coronavirus NL63 NOT DETECTED NOT DETECTED Final   Coronavirus OC43 NOT DETECTED NOT DETECTED Final   Metapneumovirus NOT DETECTED NOT DETECTED Final   Rhinovirus / Enterovirus DETECTED (A) NOT DETECTED Final   Influenza A NOT DETECTED NOT DETECTED Final   Influenza B NOT DETECTED NOT DETECTED Final   Parainfluenza Virus 1 NOT DETECTED NOT DETECTED Final   Parainfluenza Virus 2 NOT DETECTED NOT DETECTED Final   Parainfluenza Virus 3 NOT DETECTED NOT DETECTED Final   Parainfluenza Virus 4 NOT DETECTED NOT DETECTED Final   Respiratory Syncytial Virus NOT DETECTED NOT DETECTED Final   Bordetella pertussis NOT DETECTED NOT DETECTED Final   Bordetella Parapertussis NOT DETECTED NOT DETECTED Final   Chlamydophila pneumoniae NOT DETECTED NOT DETECTED Final   Mycoplasma pneumoniae NOT DETECTED NOT DETECTED Final    Comment: Performed at [facility], [place]. [address] [place], [place] [address]         Radiology Studies: ECHOCARDIOGRAM COMPLETE  Result Date: 07/21/2022    ECHOCARDIOGRAM REPORT   Patient Name:   Heather Choi Date of Exam: 07/21/2022 Medical Rec #:  [id number]      Height:       61.0 in Accession #:    [id number]     Weight:       160.0 lb Date of Birth:  04-Aug-1961       BSA:          1.718 m Patient Age:    36 years       BP:           105/60 mmHg Patient Gender: F              HR:           87 bpm. Exam Location:  [facility] Procedure: 2D Echo, Color Doppler, Cardiac Doppler and Intracardiac            Opacification Agent Indications:     R06.00 Dyspnea  History:         Patient has prior history of Echocardiogram examinations, most                  recent 01/22/2019. CHF, COPD; Risk Factors:Hypertension and                  Diabetes.  Sonographer:     Charmayne Sheer Referring Phys:  [id number] [place] TANG Diagnosing Phys: Neoma Laming  Sonographer Comments: Technically difficult study due to poor echo windows. IMPRESSIONS  1. Left ventricular ejection fraction, by estimation, is <20%. The left ventricle has severely decreased function. The left ventricle demonstrates global hypokinesis. The left ventricular internal cavity  size was severely dilated. There is mild left ventricular hypertrophy. Left ventricular diastolic parameters are consistent with Grade III diastolic dysfunction (restrictive).  2. Right ventricular systolic function is severely reduced. The right ventricular size is severely enlarged. Mildly increased right ventricular wall thickness.  3. Left atrial size was severely dilated.  4. Right atrial size was severely dilated.  5. The mitral valve is degenerative. Trivial mitral valve regurgitation. Severe mitral annular calcification.  6. The aortic valve is calcified. Aortic valve regurgitation is mild to moderate. Aortic valve sclerosis/calcification is present, without any evidence of aortic stenosis.  7. Aortic dilatation noted. FINDINGS  Left Ventricle: Left ventricular ejection fraction, by estimation, is <20%. The left ventricle has severely decreased function. The left ventricle demonstrates global hypokinesis. Definity contrast agent was given IV to delineate the left ventricular endocardial borders. The left ventricular internal cavity size was severely dilated. There is mild left ventricular hypertrophy. Left ventricular diastolic parameters are consistent with Grade III diastolic dysfunction (restrictive). Right Ventricle: The right ventricular size is severely enlarged. Mildly increased right ventricular wall thickness. Right ventricular systolic function is severely reduced. Left Atrium: Left atrial size was severely dilated. Right Atrium: Right atrial size was severely dilated. Pericardium: There is no evidence of pericardial effusion. Mitral Valve: The mitral valve is  degenerative in appearance. Severe mitral annular calcification. Trivial mitral valve regurgitation. Tricuspid Valve: The tricuspid valve is grossly normal. Tricuspid valve regurgitation is mild. Aortic Valve: The aortic valve is calcified. Aortic valve regurgitation is mild to moderate. Aortic valve sclerosis/calcification is present, without any evidence of aortic stenosis. Aortic valve mean gradient measures 4.0 mmHg. Aortic valve peak gradient measures 8.4 mmHg. Aortic valve area, by VTI measures 2.09 cm. Pulmonic Valve: The pulmonic valve was grossly normal. Pulmonic valve regurgitation is trivial. Aorta: Aortic dilatation noted. IAS/Shunts: No atrial level shunt detected by color flow Doppler.  LEFT VENTRICLE PLAX 2D LVIDd:         5.50 cm      Diastology LVIDs:         4.90 cm      LV e' medial:    3.48 cm/s LV PW:         1.70 cm      LV E/e' medial:  12.8 LV IVS:        1.10 cm      LV e' lateral:   3.92 cm/s LVOT diam:     2.30 cm      LV E/e' lateral: 11.4 LV SV:         49 LV SV Index:   29 LVOT Area:     4.15 cm  LV Volumes (MOD) LV vol d, MOD A2C: 129.0 ml LV vol d, MOD A4C: 208.0 ml LV vol s, MOD A2C: 99.9 ml LV vol s, MOD A4C: 149.0 ml LV SV MOD A2C:     29.1 ml LV SV MOD A4C:     208.0 ml LV SV MOD BP:      37.6 ml RIGHT VENTRICLE RV Basal diam:  2.90 cm RV S prime:     7.51 cm/s LEFT ATRIUM             Index        RIGHT ATRIUM          Index LA diam:        4.40 cm 2.56 cm/m   RA Area:     9.54 cm LA Vol (A2C):   43.3 ml 25.21 ml/m  RA Volume:   18.10 ml 10.54 ml/m LA Vol (A4C):   34.2 ml 19.91 ml/m LA Biplane Vol: 40.2 ml 23.40 ml/m  AORTIC VALVE                    PULMONIC VALVE AV Area (Vmax):    2.33 cm     PV Vmax:       0.58 m/s AV Area (Vmean):   2.16 cm     PV Vmean:      41.300 cm/s AV Area (VTI):     2.09 cm     PV VTI:        0.099 m AV Vmax:           145.00 cm/s  PV Peak grad:  1.3 mmHg AV Vmean:          93.500 cm/s  PV Mean grad:  1.0 mmHg AV VTI:            0.237 m AV  Peak Grad:      8.4 mmHg AV Mean Grad:      4.0 mmHg LVOT Vmax:         81.20 cm/s LVOT Vmean:        48.700 cm/s LVOT VTI:          0.119 m LVOT/AV VTI ratio: 0.50  AORTA Ao Root diam: 3.00 cm MITRAL VALVE MV Area (PHT): 5.13 cm    SHUNTS MV Decel Time: 148 msec    Systemic VTI:  0.12 m MV E velocity: 44.50 cm/s  Systemic Diam: 2.30 cm MV A velocity: 66.60 cm/s MV E/A ratio:  0.67 Shaukat Khan Electronically signed by Neoma Laming Signature Date/Time: 07/21/2022/11:54:48 AM    Final         Scheduled Meds:  aspirin EC  81 mg Oral Daily   carvedilol  6.25 mg Oral BID   enoxaparin (LOVENOX) injection  40 mg Subcutaneous Q24H   feeding supplement  237 mL Oral BID BM   fluticasone  1 spray Each Nare Daily   guaiFENesin  600 mg Oral BID   ipratropium-albuterol  3 mL Nebulization TID   levothyroxine  137 mcg Oral Q0600   loratadine  10 mg Oral Daily   multivitamin with minerals  1 tablet Oral Daily   nicotine  14 mg Transdermal Daily   potassium chloride SA  20 mEq Oral BID   torsemide  50 mg Oral BID   Continuous Infusions:  azithromycin 500 mg (07/21/22 1551)   cefTRIAXone (ROCEPHIN)  IV 1 g (07/22/22 1300)     LOS: 2 days    Time spent: 35 minutes    Damario Gillie A Kaede Clendenen, MD [facility]   If 7PM-7AM, please contact night-coverage [email or web address]  07/22/2022, 1:55 PM

## 2022-07-23 DIAGNOSIS — I5023 Acute on chronic systolic (congestive) heart failure: Secondary | ICD-10-CM | POA: Diagnosis not present

## 2022-07-23 LAB — CBC
HCT: 44.4 % (ref 36.0–46.0)
Hemoglobin: 14.2 g/dL (ref 12.0–15.0)
MCH: 27.7 pg (ref 26.0–34.0)
MCHC: 32 g/dL (ref 30.0–36.0)
MCV: 86.5 fL (ref 80.0–100.0)
Platelets: 133 10*3/uL — ABNORMAL LOW (ref 150–400)
RBC: 5.13 MIL/uL — ABNORMAL HIGH (ref 3.87–5.11)
RDW: 15 % (ref 11.5–15.5)
WBC: 5.3 10*3/uL (ref 4.0–10.5)
nRBC: 0 % (ref 0.0–0.2)

## 2022-07-23 LAB — BASIC METABOLIC PANEL
Anion gap: 9 (ref 5–15)
BUN: 43 mg/dL — ABNORMAL HIGH (ref 6–20)
CO2: 32 mmol/L (ref 22–32)
Calcium: 9 mg/dL (ref 8.9–10.3)
Chloride: 100 mmol/L (ref 98–111)
Creatinine, Ser: 1.21 mg/dL — ABNORMAL HIGH (ref 0.44–1.00)
GFR, Estimated: 51 mL/min — ABNORMAL LOW (ref >60)
Glucose, Bld: 122 mg/dL — ABNORMAL HIGH (ref 70–99)
Potassium: 3.8 mmol/L (ref 3.5–5.1)
Sodium: 141 mmol/L (ref 135–145)

## 2022-07-23 MED ORDER — TORSEMIDE 10 MG PO TABS: 50.0000 mg | ORAL_TABLET | Freq: Two times a day (BID) | ORAL | 0 refills | Status: DC

## 2022-07-23 MED ORDER — CEPHALEXIN 500 MG PO CAPS: 500.0000 mg | ORAL_CAPSULE | Freq: Three times a day (TID) | ORAL | 0 refills | Status: AC

## 2022-07-23 MED ORDER — AZITHROMYCIN 250 MG PO TABS
250.0000 mg | ORAL_TABLET | Freq: Every day | ORAL | Status: DC
  Administered 2022-07-23 – 2022-07-24 (×2): 250 mg via ORAL
  Filled 2022-07-23: qty 1

## 2022-07-23 MED ORDER — ADULT MULTIVITAMIN W/MINERALS CH: 1.0000 | ORAL_TABLET | Freq: Every day | ORAL | 0 refills | Status: DC

## 2022-07-23 MED ORDER — NICOTINE 14 MG/24HR TD PT24: 14.0000 mg | MEDICATED_PATCH | Freq: Every day | TRANSDERMAL | 0 refills | Status: AC

## 2022-07-23 MED ORDER — GUAIFENESIN ER 600 MG PO TB12: 600.0000 mg | ORAL_TABLET | Freq: Two times a day (BID) | ORAL | 0 refills | Status: DC

## 2022-07-23 MED ORDER — CARVEDILOL 6.25 MG PO TABS: 6.2500 mg | ORAL_TABLET | Freq: Two times a day (BID) | ORAL | 1 refills | Status: DC

## 2022-07-23 NOTE — TOC Initial Note (Addendum)
Transition of Care [facility]) - Initial/Assessment Note    Patient Details  Name: Heather Choi MRN: [id number] Date of Birth: 24-Jul-1961  [facility] CM/SW Contact:    Harriet Masson, RN Phone [phone] 07/23/2022, 10:02 AM  Clinical Narrative:                 Recommended out-pt PT services. Spoke with pt concerning out-pt rehab. Pt decided on [facility] ([address] [place], [place]). Pt provided information and will go through her primary provider for arrangements to go to this location. Pt reports she has a good support system and able to afford all her medications with sufficient transportation. Denies any issues with no additional needs.Pt denies any need or DME.  TOC remains available for any additional needs.  Addendum 1030- Spoke with pt once again concerning recommended home O2. Pt receptive to [employer] for services. Spoke with [facility]) for set up home concentrator, portable to bedside and contact pt with any co-pays or out of pocket expenses.   Pt having concerns about her ongoing medical issues and why she needs oxygen. TOC educated pt on her CHF as pt confirms she has a pacemaker and aware her SOB maybe related to her HF. Pt required reiteration on this disease process more then once. Pt declined consult with family and refused for [facility] RN to contact another family member to explained discharge planning needs. Pt also declined speak further in depth with the attending to have a clear understanding and importance for the recommended home 02.  Update: [employer] indicated pt did not produce the requirement to received the home O2. Pt understanding the importance of needing the oxygen and this is an unsafe discharge without this DME. Will update the team on the current situation. Therapy is stressing the importance for home 02 as well as the TOC RN.   2:00 PM: DME agency ([employer]) attempted to secure pt's home oxygen however there was  a declined on the card and daughter never followed up with the DME agency to secure. At this time this is an unsafe discharge with no oxygen provided to pt for discharge.  Will update the [facility] team concerning this situation.  Expected Discharge Plan: OP Rehab Barriers to Discharge: Barriers Resolved   Patient Goals and CMS Choice   CMS Medicare.gov Compare Post Acute Care list provided to:: Patient Choice offered to / list presented to : Patient  Expected Discharge Plan and Services Expected Discharge Plan: OP Rehab       Living arrangements for the past 2 months: Single Family Home                                      Prior Living Arrangements/Services Living arrangements for the past 2 months: Single Family Home   Patient language and need for interpreter reviewed:: Yes Do you feel safe going back to the place where you live?: Yes      Need for Family Participation in Patient Care: Yes (Comment) Care giver support system in place?: Yes (comment)   Criminal Activity/Legal Involvement Pertinent to Current Situation/Hospitalization: No - Comment as needed  Activities of Daily Living Home Assistive Devices/Equipment: None ADL Screening (condition at time of admission) Patient's cognitive ability adequate to safely complete daily activities?: Yes Is the patient deaf or have difficulty hearing?: No Does the patient have difficulty seeing, even when wearing glasses/contacts?:  No Does the patient have difficulty concentrating, remembering, or making decisions?: No Patient able to express need for assistance with ADLs?: Yes Does the patient have difficulty dressing or bathing?: No Independently performs ADLs?: Yes (appropriate for developmental age) Does the patient have difficulty walking or climbing stairs?: No Weakness of Legs: None Weakness of Arms/Hands: None  Permission Sought/Granted Permission sought to share information with : Case Manager Permission granted  to share information with : Yes, Verbal Permission Granted              Emotional Assessment Appearance:: Appears stated age Attitude/Demeanor/Rapport: Engaged Affect (typically observed): Accepting Orientation: : Oriented to Self, Oriented to Place, Oriented to  Time, Oriented to Situation      Admission diagnosis:  Shortness of breath [R06.02] Diabetic gastroparesis (HCC) [E11.43, K31.84] Prolonged QT interval [R94.31] Elevated troponin [R79.89] Acute on chronic systolic CHF (congestive heart failure) (HCC) [I50.23] Acute on chronic congestive heart failure, unspecified heart failure type [facility]) [I50.9] Patient Active Problem List   Diagnosis Date Noted   Acute on chronic systolic CHF (congestive heart failure) (HCC) 07/20/2022   Type 2 diabetes mellitus with chronic kidney disease, with long-term current use of insulin (HCC) 07/20/2022   Diabetic gastroparesis (HCC) 07/20/2022   Acute on chronic congestive heart failure (HCC)    Leg pain 02/09/2020   PAD (peripheral artery disease) (HCC) 02/09/2020   Hyperosmolar hyperglycemic state (HHS) (HCC) 12/23/2019   New onset type 2 diabetes mellitus (HCC) 12/23/2019   Conjunctivitis, right eye 12/23/2019   AKI (acute kidney injury) (HCC) 12/23/2019   Stage 3 chronic kidney disease (HCC) 07/22/2019   Chronic systolic CHF (congestive heart failure) (HCC) 01/20/2019   COPD (chronic obstructive pulmonary disease) (HCC) 01/20/2019   HTN (hypertension) 01/20/2019   Hypothyroidism 01/20/2019   Anxiety 01/20/2019   Pelvic mass 12/26/2017   Preop cardiovascular exam 11/12/2017   Heart failure (HCC) 10/31/2017   Chest pain 11/11/2016   S/P cardiac catheterization 06/11/2014   Pedal edema 05/08/2014   SOB (shortness of breath) on exertion 05/08/2014   Graves disease 05/06/2014   Iron deficiency anemia 05/06/2014   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 [place] [address] Phone: [phone] Fax: [phone]  [id number] - [place], [place] - [address] [place] [address] Phone: [phone] Fax: [phone]     Social Determinants of Health (SDOH) Interventions    Readmission Risk Interventions     No data to display

## 2022-07-23 NOTE — Progress Notes (Signed)
Physical Therapy Treatment Patient Details Name: Heather Choi MRN: [id number] DOB: [birth year] Today's Date: 07/23/2022   History of Present Illness 61 y.o. African-American female with medical history significant for asthma, CHF, COPD, type diabetes mellitus and hypertension as well as hypothyroidism, who presented to the emergency room with acute onset of dyspnea with associated orthopnea and paroxysmal nocturnal dyspnea as well as dyspnea on exertion.    PT Comments    Pt states she has been Ind ambulating in room, to/from bathroom since admission. She states that while she continues to feel some dizziness while walking, she is able to do it safely.   Vestibular screen performed again today due to persistent complaints of dizziness with head turns and positional changes. Pt negative for Dix-Hallpike indicating dizziness is not resulting from BPPV origin. No nystagmus/corrective saccades noted with smooth pursuits and saccades, although c/o dizziness and room spinning present. 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 Pt with increased difficulty and dizziness with VOR x2. Pt educated that VOR x1 and x2 testing can also be used as treatment for condition.  Pt states that she may not be able to attend OPPT vestibular rehab due to caring for her nephew, working, and financial strain. PT explained the importance of vestibular rehab for management/relief of symptoms; pt agreeable. Spent remainder of session educating pt in compensatory strategies to reduce dizziness symptoms including: moving head first and then eyes, as vestibular-occular reflex is impaired. Pt able to state improved symptoms with compensatory strategy. 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 Pt agreeable.   Pt will  continue to benefit from skilled acute PT services while hospitalized. Will continue to recommend OPPT for vestibular rehab at DC.    Recommendations for follow up therapy are one component of a multi-disciplinary discharge planning process, led by the attending physician.  Recommendations may be updated based on patient status, additional functional criteria and insurance authorization.  Follow Up Recommendations  Outpatient PT (vestibular rehab)     Assistance Recommended at Discharge Set up Supervision/Assistance     Equipment Recommendations  None recommended by PT (O2)       Precautions / Restrictions Precautions Precautions: Fall (moderate) Restrictions Weight Bearing Restrictions: No     Mobility  Bed Mobility Overal bed mobility: Independent             General bed mobility comments: Pt easily able to get in/out of bed w/o assist    Transfers                   General transfer comment: not assessed       Balance Overall balance assessment: No apparent balance deficits (not formally assessed) (seated balance at EOB)                                          Cognition Arousal/Alertness: Awake/alert Behavior During Therapy: WFL for tasks assessed/performed Overall Cognitive Status: Within Functional Limits for tasks assessed                                          Exercises Other Exercises Other Exercises: Vestibular  screen: dizziness with smooth pursuits and saccades with normal tracking and no nystagmus. Negative dix-hallpike bil. 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 Impaired tracking and increased c/o dizziness with VOR x1 and VOR x2, with increased c/o dizziness with VOR x1. Other Exercises: Pt educated re: PT role/POC, DC recommendations, importance of vestibular rehab, compensatory tips for return to work/safety at home.        Pertinent Vitals/Pain Pain Assessment Pain  Assessment: No/denies pain           PT Goals (current goals can now be found in the care plan section) Acute Rehab PT Goals Patient Stated Goal: get breathing better PT Goal Formulation: With patient Time For Goal Achievement: 08/02/22 Potential to Achieve Goals: Good Progress towards PT goals: Progressing toward goals    Frequency    Min 2X/week      PT Plan Current plan remains appropriate       AM-PAC PT "6 Clicks" Mobility   Outcome Measure  Help needed turning from your back to your side while in a flat bed without using bedrails?: None Help needed moving from lying on your back to sitting on the side of a flat bed without using bedrails?: None Help needed moving to and from a bed to a chair (including a wheelchair)?: None Help needed standing up from a chair using your arms (e.g., wheelchair or bedside chair)?: None Help needed to walk in hospital room?: None Help needed climbing 3-5 steps with a railing? : None 6 Click Score: 24    End of Session Equipment Utilized During Treatment: Oxygen (3L) Activity Tolerance: Patient tolerated treatment well Patient left: in bed;with call bell/phone within reach Nurse Communication: Mobility status PT Visit Diagnosis: Unsteadiness on feet (R26.81);Other abnormalities of gait and mobility (R26.89)     Time: 1696-7893 PT Time Calculation (min) (ACUTE ONLY): 31 min  Charges:  $Neuromuscular Re-education: 23-37 mins                    Vira Blanco, PT, DPT 1:49 PM,07/23/22 Physical Therapist - [facility]

## 2022-07-23 NOTE — Progress Notes (Signed)
PROGRESS NOTE    Heather Choi  RJJ:[id number] DOB: Apr 09, 1961 DOA: 07/19/2022 PCP: Derinda Late, MD   Brief Narrative: 61 year old with past medical history significant for asthma, systolic heart failure ejection fraction 25%, COPD, diabetes type 2, hypertension, hypothyroidism who presents complaining of acute onset dyspnea associated orthopnea, paroxysmal nocturnal dyspnea, weight gain over the last year, abdominal distention.  Evaluation in the ED chest x-ray with vascular congestion, BNP 63,.  Admitted for heart failure exacerbation, received IV Lasix.   Assessment & Plan:   Principal Problem:   Acute on chronic systolic CHF (congestive heart failure) (HCC) Active Problems:   AKI (acute kidney injury) ([place])   COPD (chronic obstructive pulmonary disease) (HCC)   Type 2 diabetes mellitus with chronic kidney disease, with long-term current use of insulin (HCC)   Hypothyroidism   Diabetic gastroparesis (HCC)  1-Acute on chronic systolic heart failure exacerbation: Aortic valve regurgitation moderate -Presented with shortness of breath, orthopnea, weight gain over the last year, worsening abdominal distention over the last couple of days, admitted for heart failure exacerbation although BNP not significantly elevated. -Treated Initially with  IV Lasix.  -Hold Entresto, and spironolactone. She was not taking spironolactone.  -2D echo: Ef 20 %, grade 3 diastolic dysfunction, right ventricular systolic function severely reduced. FU cardiology rec -Cardiology has been consulted. Started torsemide.  -Daily weight, strict I's and O's: weight: 160--158 -Carvedilol dose reduce due to orthostatic hypotension.   Stable for discharge  2-COPD, also component of acute exacerbation: Acute Hypoxic respiratory failure. Requiring 3 L oxygen. Oxygen down to 84 on RA.  Continue with nebulizers, continue with guaifenesin Flutter valve Smoking cessation discussed Rhino virus respiratory panel.  Support care.  Started  IV antibiotics to cover for super impose Bacterial infection. Discharge on Keflex and Azithro She will need oxygen at discharge. Unable to afford oxygen, not safe discharge plan.   3-Dizziness: Unable to perform MRI as he has defibrillator. CT head; negative  B 12: 539. Might be related to orthostatic hypotension vs vestibular dysfunction.  Orthostatic hypotension  resolved.   4-Diabetes type 2 associated with CKD long-term use of insulin Continue with sliding scale, hold metformin.  Continue with Jardiance.  Hypothyroidism: Continue with Synthroid. Mild elevation troponin; In setting HF exacerbation.    Nutrition Problem: Increased nutrient needs Etiology: chronic illness (CHF)    Signs/Symptoms: estimated needs    Interventions: Ensure Enlive (each supplement provides 350kcal and 20 grams of protein), MVI, Liberalize Diet  Estimated body mass index is 29.95 kg/m as calculated from the following:   Height as of this encounter: 5\' 1"  (1.549 m).   Weight as of this encounter: 71.9 kg.   DVT prophylaxis: Lovenox Code Status: Full code, wishes to be full code now.  Family Communication: discussed with son over phone  Disposition Plan:  Status is: Inpatient Remains inpatient appropriate because: remain inpatient for HF exacerbation.     Consultants:  Cardiology  Procedures:  ECHO  Antimicrobials:    Subjective: Feeling better, dizziness improved.   Objective: Vitals:   07/23/22 0039 07/23/22 0436 07/23/22 0749 07/23/22 0832  BP: 110/77 (!) 110/59 126/78   Pulse: 84 81 81   Resp: 19 20 19    Temp: 98.2 F (36.8 C) 98 F (36.7 C) 98.1 F (36.7 C)   TempSrc: Oral Oral Oral   SpO2: 94% 93% 97% 97%  Weight:      Height:       No intake or output data in the 24 hours ending  07/23/22 1454  Filed Weights   07/19/22 1728 07/22/22 0556  Weight: 72.6 kg 71.9 kg    Examination:  General exam: NAD Respiratory system:  CTA Cardiovascular system: S 1, S 2 RRR Gastrointestinal system: BS present, soft, nt Central nervous system: Alert, follows command Extremities: No edema   Data Reviewed: I have personally reviewed following labs and imaging studies  CBC: Recent Labs  Lab 07/19/22 2236 07/20/22 0413 07/23/22 0625  WBC 7.4 7.1 5.3  NEUTROABS 4.7  --   --   HGB 14.1 14.0 14.2  HCT 44.8 43.7 44.4  MCV 86.2 86.5 86.5  PLT 145* 115* 133*    Basic Metabolic Panel: Recent Labs  Lab 07/19/22 2236 07/20/22 0004 07/20/22 0413 07/21/22 0551 07/23/22 0625  NA 141  --  139 138 141  K 3.5  --  3.6 3.9 3.8  CL 108  --  105 98 100  CO2 27  --  27 31 32  GLUCOSE 99  --  83 145* 122*  BUN 21*  --  23* 32* 43*  CREATININE 1.42*  --  1.33* 1.33* 1.21*  CALCIUM 8.9  --  8.5* 8.6* 9.0  MG  --  1.9  --   --   --     GFR: Estimated Creatinine Clearance: 44.8 mL/min (A) (by C-G formula based on SCr of 1.21 mg/dL (H)). Liver Function Tests: No results for input(s): "AST", "ALT", "ALKPHOS", "BILITOT", "PROT", "ALBUMIN" in the last 168 hours. No results for input(s): "LIPASE", "AMYLASE" in the last 168 hours. No results for input(s): "AMMONIA" in the last 168 hours. Coagulation Profile: No results for input(s): "INR", "PROTIME" in the last 168 hours. Cardiac Enzymes: No results for input(s): "CKTOTAL", "CKMB", "CKMBINDEX", "TROPONINI" in the last 168 hours. BNP (last 3 results) No results for input(s): "PROBNP" in the last 8760 hours. HbA1C: No results for input(s): "HGBA1C" in the last 72 hours. CBG: No results for input(s): "GLUCAP" in the last 168 hours. Lipid Profile: No results for input(s): "CHOL", "HDL", "LDLCALC", "TRIG", "CHOLHDL", "LDLDIRECT" in the last 72 hours. Thyroid Function Tests: No results for input(s): "TSH", "T4TOTAL", "FREET4", "T3FREE", "THYROIDAB" in the last 72 hours. Anemia Panel: No results for input(s): "VITAMINB12", "FOLATE", "FERRITIN", "TIBC", "IRON", "RETICCTPCT" in  the last 72 hours.  Sepsis Labs: No results for input(s): "PROCALCITON", "LATICACIDVEN" in the last 168 hours.  Recent Results (from the past 240 hour(s))  SARS Coronavirus 2 by RT PCR (hospital order, performed in [facility] hospital lab) *cepheid single result test* Anterior Nasal Swab     Status: None   Collection Time: 07/19/22  5:26 PM   Specimen: Anterior Nasal Swab  Result Value Ref Range Status   SARS Coronavirus 2 by RT PCR NEGATIVE NEGATIVE Final    Comment: (NOTE) SARS-CoV-2 target nucleic acids are NOT DETECTED.  The SARS-CoV-2 RNA is generally detectable in upper and lower respiratory specimens during the acute phase of infection. The lowest concentration of SARS-CoV-2 viral copies this assay can detect is 250 copies / mL. A negative result does not preclude SARS-CoV-2 infection and should not be used as the sole basis for treatment or other patient management decisions.  A negative result may occur with improper specimen collection / handling, submission of specimen other than nasopharyngeal swab, presence of viral mutation(s) within the areas targeted by this assay, and inadequate number of viral copies (<250 copies / mL). A negative result must be combined with clinical observations, patient history, and epidemiological information.  Fact  Sheet for Patients:   RoadLapTop.co.za  Fact Sheet for Healthcare Providers: http://kim-miller.com/  This test is not yet approved or  cleared by the [place] FDA and has been authorized for detection and/or diagnosis of SARS-CoV-2 by FDA under an Emergency Use Authorization (EUA).  This EUA will remain in effect (meaning this test can be used) for the duration of the COVID-19 declaration under Section 564(b)(1) of the Act, 21 U.S.C. section 360bbb-3(b)(1), unless the authorization is terminated or revoked sooner.  Performed at [facility], [address]  [place], [place] [address]   Respiratory (~20 pathogens) panel by PCR     Status: Abnormal   Collection Time: 07/20/22  4:34 PM   Specimen: Nasopharyngeal Swab; Respiratory  Result Value Ref Range Status   Adenovirus NOT DETECTED NOT DETECTED Final   Coronavirus 229E NOT DETECTED NOT DETECTED Final    Comment: (NOTE) The Coronavirus on the Respiratory Panel, DOES NOT test for the novel  Coronavirus (2019 nCoV)    Coronavirus HKU1 NOT DETECTED NOT DETECTED Final   Coronavirus NL63 NOT DETECTED NOT DETECTED Final   Coronavirus OC43 NOT DETECTED NOT DETECTED Final   Metapneumovirus NOT DETECTED NOT DETECTED Final   Rhinovirus / Enterovirus DETECTED (A) NOT DETECTED Final   Influenza A NOT DETECTED NOT DETECTED Final   Influenza B NOT DETECTED NOT DETECTED Final   Parainfluenza Virus 1 NOT DETECTED NOT DETECTED Final   Parainfluenza Virus 2 NOT DETECTED NOT DETECTED Final   Parainfluenza Virus 3 NOT DETECTED NOT DETECTED Final   Parainfluenza Virus 4 NOT DETECTED NOT DETECTED Final   Respiratory Syncytial Virus NOT DETECTED NOT DETECTED Final   Bordetella pertussis NOT DETECTED NOT DETECTED Final   Bordetella Parapertussis NOT DETECTED NOT DETECTED Final   Chlamydophila pneumoniae NOT DETECTED NOT DETECTED Final   Mycoplasma pneumoniae NOT DETECTED NOT DETECTED Final    Comment: Performed at [facility], [address], [place], [place] [address]         Radiology Studies: No results found.      Scheduled Meds:  aspirin EC  81 mg Oral Daily   azithromycin  250 mg Oral Daily   carvedilol  6.25 mg Oral BID   enoxaparin (LOVENOX) injection  40 mg Subcutaneous Q24H   feeding supplement  237 mL Oral BID BM   fluticasone  1 spray Each Nare Daily   guaiFENesin  600 mg Oral BID   levothyroxine  137 mcg Oral Q0600   loratadine  10 mg Oral Daily   multivitamin with minerals  1 tablet Oral Daily   nicotine  14 mg Transdermal Daily   potassium chloride SA  20 mEq Oral BID    torsemide  50 mg Oral BID   Continuous Infusions:  cefTRIAXone (ROCEPHIN)  IV 1 g (07/23/22 1445)     LOS: 3 days    Time spent: 35 minutes    Heather Zavadil A Tyiesha Brackney, MD [facility]   If 7PM-7AM, please contact night-coverage [email or web address]  07/23/2022, 2:54 PM

## 2022-07-23 NOTE — Progress Notes (Signed)
SUBJECTIVE: Patient presented to ED on 07/19/22 complaining of productive cough, congestion, dizziness and weakness. 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 Patient admitted for worsening dyspnea secondary to Rhinovirus and COPD exacerbation.    Vitals:   07/23/22 0039 07/23/22 0436 07/23/22 0749 07/23/22 0832  BP: 110/77 (!) 110/59 126/78   Pulse: 84 81 81   Resp: 19 20 19    Temp: 98.2 F (36.8 C) 98 F (36.7 C) 98.1 F (36.7 C)   TempSrc: Oral Oral Oral   SpO2: 94% 93% 97% 97%  Weight:      Height:       No intake or output data in the 24 hours ending 07/23/22 1356  LABS: Basic Metabolic Panel: Recent Labs    07/21/22 0551 07/23/22 0625  NA 138 141  K 3.9 3.8  CL 98 100  CO2 31 32  GLUCOSE 145* 122*  BUN 32* 43*  CREATININE 1.33* 1.21*  CALCIUM 8.6* 9.0   Liver Function Tests: No results for input(s): "AST", "ALT", "ALKPHOS", "BILITOT", "PROT", "ALBUMIN" in the last 72 hours. No results for input(s): "LIPASE", "AMYLASE" in the last 72 hours. CBC: Recent Labs    07/23/22 0625  WBC 5.3  HGB 14.2  HCT 44.4  MCV 86.5  PLT 133*   Cardiac Enzymes: No results for input(s): "CKTOTAL", "CKMB", "CKMBINDEX", "TROPONINI" in the last 72 hours. BNP: Invalid input(s): "POCBNP" D-Dimer: No results for input(s): "DDIMER" in the last 72 hours. Hemoglobin A1C: No results for input(s): "HGBA1C" in the last 72 hours. Fasting Lipid Panel: No results for input(s): "CHOL", "HDL", "LDLCALC", "TRIG", "CHOLHDL", "LDLDIRECT" in the last 72 hours. Thyroid Function Tests: No results for input(s): "TSH", "T4TOTAL", "T3FREE", "THYROIDAB" in the last 72 hours.  Invalid input(s): "FREET3" Anemia Panel: No results for input(s):  "VITAMINB12", "FOLATE", "FERRITIN", "TIBC", "IRON", "RETICCTPCT" in the last 72 hours.   PHYSICAL EXAM General: Well developed, well nourished, in no acute distress HEENT:  Normocephalic and atramatic Neck:  No JVD.  Lungs: Clear bilaterally to auscultation and percussion. Heart: HRRR . Normal S1 and S2 without gallops or murmurs.  Abdomen: Bowel sounds are positive, abdomen soft and non-tender  Msk:  Back normal, normal gait. Normal strength and tone for age. Extremities: No clubbing, cyanosis or edema.   Neuro: Alert and oriented X 3. Psych:  Good affect, responds appropriately  TELEMETRY: sinus rhythm, HR 95 bpm  ASSESSMENT AND PLAN: Patient resting comfortably in bed. Denies chest pain, dizziness. Reports shortness of breath improved. Patient can be discharged home today with close follow up in CHF clinic and with primary cardiologist.   Principal Problem:   Acute on chronic systolic CHF (congestive heart failure) ([place]) Active Problems:   COPD (chronic obstructive pulmonary disease) ([place])   Hypothyroidism   AKI (acute kidney injury) ([place])   Type 2 diabetes mellitus with chronic kidney disease, with long-term current use of insulin ([place])   Diabetic gastroparesis ([place])    Branson Kranz, FNP-C 07/23/2022 1:56 PM

## 2022-07-24 DIAGNOSIS — I5023 Acute on chronic systolic (congestive) heart failure: Secondary | ICD-10-CM | POA: Diagnosis not present

## 2022-07-24 MED ORDER — ALBUTEROL SULFATE HFA 108 (90 BASE) MCG/ACT IN AERS: 2.0000 | INHALATION_SPRAY | Freq: Four times a day (QID) | RESPIRATORY_TRACT | 2 refills | Status: AC | PRN

## 2022-07-24 NOTE — Progress Notes (Addendum)
Referral sent to [employer] to determine cost associated with obtaining oxygen.   Spoke with [employer] rep. Patient will have to complete financial forms to process oxygen.   2:07pm Per MD patient unable to discharge until oxygen is secured.   2:30pm Patient stated she had completed financial assistance forms and was waiting for the [employer] rep to pick them up. She refused therapy. Patient stated she can not afford oxygen and outpatient therapy. He sister will pick her up at 4:30. TOC signing off.
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
## 2022-07-31 ENCOUNTER — Ambulatory Visit: Payer: BC Managed Care – PPO | Admitting: Family

## 2022-08-01 ENCOUNTER — Ambulatory Visit: Payer: BC Managed Care – PPO | Admitting: Family
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
## 2022-08-14 ENCOUNTER — Encounter: Payer: Self-pay | Admitting: Family

## 2022-08-14 ENCOUNTER — Ambulatory Visit: Payer: BC Managed Care – PPO | Attending: Family | Admitting: Family

## 2022-08-14 VITALS — BP 123/73 | HR 72 | Resp 20 | Wt 164.0 lb

## 2022-08-14 DIAGNOSIS — E119 Type 2 diabetes mellitus without complications: Secondary | ICD-10-CM | POA: Diagnosis not present

## 2022-08-14 DIAGNOSIS — I5022 Chronic systolic (congestive) heart failure: Secondary | ICD-10-CM

## 2022-08-14 DIAGNOSIS — I10 Essential (primary) hypertension: Secondary | ICD-10-CM | POA: Diagnosis not present

## 2022-08-14 DIAGNOSIS — Z720 Tobacco use: Secondary | ICD-10-CM

## 2022-08-14 DIAGNOSIS — E079 Disorder of thyroid, unspecified: Secondary | ICD-10-CM | POA: Diagnosis not present

## 2022-08-14 DIAGNOSIS — I110 Hypertensive heart disease with heart failure: Secondary | ICD-10-CM | POA: Diagnosis present

## 2022-08-14 DIAGNOSIS — Z794 Long term (current) use of insulin: Secondary | ICD-10-CM

## 2022-08-14 DIAGNOSIS — J4489 Other specified chronic obstructive pulmonary disease: Secondary | ICD-10-CM | POA: Insufficient documentation

## 2022-08-14 NOTE — Patient Instructions (Signed)
Continue weighing daily and call for an overnight weight gain of 3 pounds or more or a weekly weight gain of more than 5 pounds.   If you have voicemail, please make sure your mailbox is cleaned out so that we may leave a message and please make sure to listen to any voicemails.     

## 2022-11-11 NOTE — Progress Notes (Unsigned)
Patient ID: MATISYN POST, female    DOB: 1961-05-21, 62 y.o.   MRN: [id number]  HPI  Ms Griesemer is a 62 y/o female with a history of asthma, DM, HTN, thyroid disease, bell's palsy, COPD, current tobacco use and chronic heart failure.   Echo report from 07/21/22 and showed an EF of <20% along with mild LVH, severe LAE and mild/moderate AR. Echo report from 01/22/2019 reviewed and showed an EF of 25-30% along with moderate AR.   Admitted 07/19/22 due to acute onset dyspnea associated orthopnea, paroxysmal nocturnal dyspnea, weight gain over the last year, abdominal distention due to HF exacerbation. Cardiology consult obtained. Initially given IV lasix with transition to oral diuretics. Carvedilol decreased due to hypotension. Given antibiotics. Unable to be weaned off of oxygen. Head CT done due to dizziness and it was negative. Elevated troponin thought to be due to HF exacerbation.  Discharged after 5 days.   She presents today for a follow-up visit with a chief complaint of  Past Medical History:  Diagnosis Date   Asthma    Bell's palsy    fingers tingling in cold weather   CHF (congestive heart failure) (HCC)    COPD (chronic obstructive pulmonary disease) ([place])    Diabetes mellitus without complication ([place])    Enlarged heart    Hypertension    Thyroid disease    Past Surgical History:  Procedure Laterality Date   ABDOMINAL HYSTERECTOMY     CHOLECYSTECTOMY     IMPLANTABLE CARDIOVERTER DEFIBRILLATOR IMPLANT     laparoscopic bilateral oophorectomy with removal of adnexal mass and lysis of adhesions  11/20/2017   LAPAROSCOPY     with removal of adnexal structure   Family History  Problem Relation Age of Onset   Heart failure Father    Heart attack Mother    Social History   Tobacco Use   Smoking status: Every Day    Packs/day: 0.50    Types: Cigarettes   Smokeless tobacco: Never  Substance Use Topics   Alcohol use: Not Currently    Comment: rarely   Allergies   Allergen Reactions   Clindamycin/Lincomycin     Burning sensastion   Amlodipine Other (See Comments) and Itching   Penicillins Other (See Comments)    Has patient had a PCN reaction causing immediate rash, facial/tongue/throat swelling, SOB or lightheadedness with hypotension: Yes Has patient had a PCN reaction causing severe rash involving mucus membranes or skin necrosis: No Has patient had a PCN reaction that required hospitalization: No Has patient had a PCN reaction occurring within the last 10 years: Yes If all of the above answers are "NO", then may proceed with Cephalosporin use.   Hydrochlorothiazide Rash    Review of Systems  Constitutional:  Positive for fatigue. Negative for appetite change.  HENT:  Negative for congestion, postnasal drip and sore throat.   Eyes: Negative.   Respiratory:  Positive for shortness of breath. Negative for cough, chest tightness and wheezing.   Cardiovascular:  Negative for chest pain, palpitations and leg swelling.  Gastrointestinal:  Positive for abdominal distention. Negative for abdominal pain and nausea.  Endocrine: Negative.   Genitourinary: Negative.   Musculoskeletal:  Positive for back pain. Negative for neck pain.  Skin: Negative.   Allergic/Immunologic: Negative.   Neurological:  Positive for light-headedness and headaches (especially when waking up in morning). Negative for dizziness, weakness and numbness.  Hematological:  Negative for adenopathy. Does not bruise/bleed easily.  Psychiatric/Behavioral:  Positive for sleep disturbance (  sleeping on 3 pillows). Negative for dysphoric mood. The patient is not nervous/anxious.      Physical Exam Vitals and nursing note reviewed.  Constitutional:      Appearance: She is well-developed.  HENT:     Head: Normocephalic and atraumatic.  Neck:     Vascular: No JVD.  Cardiovascular:     Rate and Rhythm: Normal rate and regular rhythm.  Pulmonary:     Effort: Pulmonary effort is  normal. No respiratory distress.     Breath sounds: No wheezing or rales.  Abdominal:     General: There is no distension.     Tenderness: There is no abdominal tenderness.  Musculoskeletal:     Cervical back: Normal range of motion and neck supple.     Right lower leg: No tenderness. No edema.     Left lower leg: No tenderness. No edema.  Skin:    General: Skin is warm and dry.  Neurological:     General: No focal deficit present.     Mental Status: She is alert and oriented to person, place, and time.  Psychiatric:        Mood and Affect: Mood normal.        Behavior: Behavior normal.    Assessment & Plan:  1: Chronic heart failure with reduced ejection fraction- - NYHA class II - euvolemic today - weighing daily and she was reminded to call for an overnight weight gain of >2 pounds or a weekly weight gain of >5 pounds - weight 164 pounds from last visit here 3 months ago - not adding salt to her food and has been trying to read food labels for sodium content - saw cardiology Margarito Courser) 09/04/22 - on GDMT of carvedilol & spironolactone - BNP 07/19/22 was 63.4 - has received her flu vaccine for this season  2: HTN- - BP  - saw PCP Baldemar Lenis) 08/10/22 - BMP 08/07/22 reviewed and showed sodium 140, potassium 3.5, creatinine 1.3 and GFR 47  3: DM- - A1c 09/20/20 was 5.4% - nonfasting glucose at home this morning was  - saw vascular Delana Meyer) 02/09/20  4: Tobacco use- - no longer smoking for the last month - continued cessation discussed for 3 minutes   Medication list reviewed.

## 2022-11-13 ENCOUNTER — Encounter: Payer: Self-pay | Admitting: Family

## 2022-11-13 ENCOUNTER — Ambulatory Visit: Payer: BC Managed Care – PPO | Attending: Family | Admitting: Family

## 2022-11-13 VITALS — BP 124/79 | HR 73 | Resp 18 | Wt 160.0 lb

## 2022-11-13 DIAGNOSIS — Z720 Tobacco use: Secondary | ICD-10-CM | POA: Diagnosis not present

## 2022-11-13 DIAGNOSIS — J4489 Other specified chronic obstructive pulmonary disease: Secondary | ICD-10-CM | POA: Insufficient documentation

## 2022-11-13 DIAGNOSIS — I110 Hypertensive heart disease with heart failure: Secondary | ICD-10-CM | POA: Diagnosis not present

## 2022-11-13 DIAGNOSIS — I10 Essential (primary) hypertension: Secondary | ICD-10-CM | POA: Diagnosis not present

## 2022-11-13 DIAGNOSIS — Z87891 Personal history of nicotine dependence: Secondary | ICD-10-CM | POA: Diagnosis not present

## 2022-11-13 DIAGNOSIS — E119 Type 2 diabetes mellitus without complications: Secondary | ICD-10-CM | POA: Insufficient documentation

## 2022-11-13 DIAGNOSIS — Z7984 Long term (current) use of oral hypoglycemic drugs: Secondary | ICD-10-CM | POA: Diagnosis not present

## 2022-11-13 DIAGNOSIS — E079 Disorder of thyroid, unspecified: Secondary | ICD-10-CM | POA: Insufficient documentation

## 2022-11-13 DIAGNOSIS — I959 Hypotension, unspecified: Secondary | ICD-10-CM | POA: Insufficient documentation

## 2022-11-13 DIAGNOSIS — Z794 Long term (current) use of insulin: Secondary | ICD-10-CM

## 2022-11-13 DIAGNOSIS — I5022 Chronic systolic (congestive) heart failure: Secondary | ICD-10-CM | POA: Diagnosis present

## 2022-11-13 DIAGNOSIS — K5909 Other constipation: Secondary | ICD-10-CM | POA: Diagnosis not present

## 2022-11-13 DIAGNOSIS — G510 Bell's palsy: Secondary | ICD-10-CM | POA: Insufficient documentation

## 2022-11-13 DIAGNOSIS — Z79899 Other long term (current) drug therapy: Secondary | ICD-10-CM | POA: Insufficient documentation

## 2022-11-13 DIAGNOSIS — G479 Sleep disorder, unspecified: Secondary | ICD-10-CM | POA: Diagnosis not present

## 2022-11-13 NOTE — Patient Instructions (Addendum)
Continue weighing daily and call for an overnight weight gain of 3 pounds or more or a weekly weight gain of more than 5 pounds.   If you have voicemail, please make sure your mailbox is cleaned out so that we may leave a message and please make sure to listen to any voicemails.    Call the GI group at [facility] to see about making an appointment.

## 2023-01-15 ENCOUNTER — Encounter: Payer: BC Managed Care – PPO | Admitting: Family

## 2023-01-15 NOTE — Progress Notes (Deleted)
Patient ID: Heather Choi, female    DOB: 06/10/1961, 62 y.o.   MRN: [id number]  Primary cardiologist: Leanora Ivanoff, Wetzel Bjornstad, MD (last seen 02/24; returns 05/24) PCP: Kandyce Rud, MD (last seen 03/24; returns 09/24)  HPI  Heather Choi is a 62 y/o female with a history of asthma, DM, HTN, thyroid disease, bell's palsy, COPD, current tobacco use and chronic heart failure.   Echo 07/21/22: EF of <20% along with mild LVH, severe LAE and mild/moderate AR. Echo 01/22/2019 reviewed and showed an EF of 25-30% along with moderate AR.   Admitted 07/19/22 due to acute onset dyspnea associated orthopnea, paroxysmal nocturnal dyspnea, weight gain over the last year, abdominal distention due to HF exacerbation. Cardiology consult obtained. Initially given IV lasix with transition to oral diuretics. Carvedilol decreased due to hypotension. Given antibiotics. Unable to be weaned off of oxygen. Head CT done due to dizziness and it was negative. Elevated troponin thought to be due to HF exacerbation.  Discharged after 5 days.   She presents today for a HF follow-up visit with a chief complaint of   Past Medical History:  Diagnosis Date   Asthma    Bell's palsy    fingers tingling in cold weather   CHF (congestive heart failure) (HCC)    COPD (chronic obstructive pulmonary disease) (HCC)    Diabetes mellitus without complication (HCC)    Enlarged heart    Hypertension    Thyroid disease    Past Surgical History:  Procedure Laterality Date   ABDOMINAL HYSTERECTOMY     CHOLECYSTECTOMY     IMPLANTABLE CARDIOVERTER DEFIBRILLATOR IMPLANT     laparoscopic bilateral oophorectomy with removal of adnexal mass and lysis of adhesions  11/20/2017   LAPAROSCOPY     with removal of adnexal structure   Family History  Problem Relation Age of Onset   Heart failure Father    Heart attack Mother    Social History   Tobacco Use   Smoking status: Every Day    Packs/day: .5    Types: Cigarettes    Smokeless tobacco: Never  Substance Use Topics   Alcohol use: Not Currently    Comment: rarely   Allergies  Allergen Reactions   Clindamycin/Lincomycin     Burning sensastion   Amlodipine Other (See Comments) and Itching   Penicillins Other (See Comments)    Has patient had a PCN reaction causing immediate rash, facial/tongue/throat swelling, SOB or lightheadedness with hypotension: Yes Has patient had a PCN reaction causing severe rash involving mucus membranes or skin necrosis: No Has patient had a PCN reaction that required hospitalization: No Has patient had a PCN reaction occurring within the last 10 years: Yes If all of the above answers are "NO", then may proceed with Cephalosporin use.   Hydrochlorothiazide Rash    Review of Systems  Constitutional:  Positive for fatigue. Negative for appetite change.  HENT:  Negative for congestion, postnasal drip and sore throat.   Eyes: Negative.   Respiratory:  Positive for cough (dry) and shortness of breath. Negative for chest tightness and wheezing.   Cardiovascular:  Negative for chest pain, palpitations and leg swelling.  Gastrointestinal:  Positive for abdominal distention and constipation. Negative for abdominal pain and nausea.  Endocrine: Negative.   Genitourinary: Negative.   Musculoskeletal:  Positive for back pain. Negative for neck pain.  Skin: Negative.   Allergic/Immunologic: Negative.   Neurological:  Positive for light-headedness and headaches (especially when waking up in morning). Negative  for dizziness, weakness and numbness.  Hematological:  Negative for adenopathy. Does not bruise/bleed easily.  Psychiatric/Behavioral:  Positive for sleep disturbance (sleeping on 3 pillows). Negative for dysphoric mood. The patient is not nervous/anxious.      Physical Exam Vitals and nursing note reviewed.  Constitutional:      Appearance: She is well-developed.  HENT:     Head: Normocephalic and atraumatic.  Neck:      Vascular: No JVD.  Cardiovascular:     Rate and Rhythm: Normal rate and regular rhythm.  Pulmonary:     Effort: Pulmonary effort is normal. No respiratory distress.     Breath sounds: No wheezing or rales.  Abdominal:     General: There is no distension.     Tenderness: There is no abdominal tenderness.  Musculoskeletal:     Cervical back: Normal range of motion and neck supple.     Right lower leg: No tenderness. No edema.     Left lower leg: No tenderness. No edema.  Skin:    General: Skin is warm and dry.  Neurological:     General: No focal deficit present.     Mental Status: She is alert and oriented to person, place, and time.  Psychiatric:        Mood and Affect: Mood normal.        Behavior: Behavior normal.    Assessment & Plan:  1: Chronic heart failure with reduced ejection fraction- - NYHA class II - euvolemic today - weighing daily and she was reminded to call for an overnight weight gain of >2 pounds or a weekly weight gain of >5 pounds - weight 160 pounds from last visit here 2 months ago - not adding salt to her food and has been trying to read food labels for sodium content - saw cardiology Mellissa Kohut) 02/24 - continue carvedilol - continue entresto  - continue spironolactone - BNP 07/19/22 was 63.4  2: HTN- - BP  - saw PCP Larwance Sachs) 03/24 - BMP 12/18/22 reviewed and showed sodium 145, potassium 3.9, creatinine 1.0 and GFR 64  3: DM- - A1c 09/20/20 was 5.4% - saw vascular Gilda Crease) 02/09/20  4: Tobacco use- - no longer smoking  - continued cessation discussed for 3 minutes

## 2023-12-07 ENCOUNTER — Inpatient Hospital Stay
Admission: EM | Admit: 2023-12-07 | Discharge: 2023-12-11 | DRG: 871 | Disposition: A | Discharge status: Home Health | Source: Ambulatory Visit | Attending: Obstetrics and Gynecology | Admitting: Obstetrics and Gynecology

## 2023-12-07 ENCOUNTER — Encounter: Payer: Self-pay | Admitting: Emergency Medicine

## 2023-12-07 ENCOUNTER — Emergency Department

## 2023-12-07 ENCOUNTER — Other Ambulatory Visit: Payer: Self-pay

## 2023-12-07 DIAGNOSIS — I509 Heart failure, unspecified: Secondary | ICD-10-CM

## 2023-12-07 DIAGNOSIS — J1001 Influenza due to other identified influenza virus with the same other identified influenza virus pneumonia: Secondary | ICD-10-CM | POA: Diagnosis present

## 2023-12-07 DIAGNOSIS — E1122 Type 2 diabetes mellitus with diabetic chronic kidney disease: Secondary | ICD-10-CM | POA: Diagnosis present

## 2023-12-07 DIAGNOSIS — R6520 Severe sepsis without septic shock: Secondary | ICD-10-CM | POA: Diagnosis present

## 2023-12-07 DIAGNOSIS — F17210 Nicotine dependence, cigarettes, uncomplicated: Secondary | ICD-10-CM | POA: Diagnosis present

## 2023-12-07 DIAGNOSIS — I5023 Acute on chronic systolic (congestive) heart failure: Secondary | ICD-10-CM | POA: Diagnosis present

## 2023-12-07 DIAGNOSIS — E039 Hypothyroidism, unspecified: Secondary | ICD-10-CM | POA: Diagnosis present

## 2023-12-07 DIAGNOSIS — Z79899 Other long term (current) drug therapy: Secondary | ICD-10-CM

## 2023-12-07 DIAGNOSIS — J440 Chronic obstructive pulmonary disease with acute lower respiratory infection: Secondary | ICD-10-CM | POA: Diagnosis present

## 2023-12-07 DIAGNOSIS — J441 Chronic obstructive pulmonary disease with (acute) exacerbation: Principal | ICD-10-CM | POA: Diagnosis present

## 2023-12-07 DIAGNOSIS — Z7982 Long term (current) use of aspirin: Secondary | ICD-10-CM | POA: Diagnosis not present

## 2023-12-07 DIAGNOSIS — Z8249 Family history of ischemic heart disease and other diseases of the circulatory system: Secondary | ICD-10-CM | POA: Diagnosis not present

## 2023-12-07 DIAGNOSIS — Z1152 Encounter for screening for COVID-19: Secondary | ICD-10-CM

## 2023-12-07 DIAGNOSIS — D6959 Other secondary thrombocytopenia: Secondary | ICD-10-CM | POA: Diagnosis present

## 2023-12-07 DIAGNOSIS — Z90710 Acquired absence of both cervix and uterus: Secondary | ICD-10-CM

## 2023-12-07 DIAGNOSIS — J09X1 Influenza due to identified novel influenza A virus with pneumonia: Secondary | ICD-10-CM | POA: Insufficient documentation

## 2023-12-07 DIAGNOSIS — I429 Cardiomyopathy, unspecified: Secondary | ICD-10-CM | POA: Diagnosis present

## 2023-12-07 DIAGNOSIS — A419 Sepsis, unspecified organism: Secondary | ICD-10-CM

## 2023-12-07 DIAGNOSIS — J101 Influenza due to other identified influenza virus with other respiratory manifestations: Secondary | ICD-10-CM

## 2023-12-07 DIAGNOSIS — Z79890 Hormone replacement therapy (postmenopausal): Secondary | ICD-10-CM

## 2023-12-07 DIAGNOSIS — Z794 Long term (current) use of insulin: Secondary | ICD-10-CM | POA: Diagnosis not present

## 2023-12-07 DIAGNOSIS — I130 Hypertensive heart and chronic kidney disease with heart failure and stage 1 through stage 4 chronic kidney disease, or unspecified chronic kidney disease: Secondary | ICD-10-CM | POA: Diagnosis present

## 2023-12-07 DIAGNOSIS — J9601 Acute respiratory failure with hypoxia: Secondary | ICD-10-CM

## 2023-12-07 DIAGNOSIS — J449 Chronic obstructive pulmonary disease, unspecified: Secondary | ICD-10-CM | POA: Diagnosis present

## 2023-12-07 DIAGNOSIS — Z881 Allergy status to other antibiotic agents status: Secondary | ICD-10-CM

## 2023-12-07 DIAGNOSIS — Z7984 Long term (current) use of oral hypoglycemic drugs: Secondary | ICD-10-CM

## 2023-12-07 DIAGNOSIS — N183 Chronic kidney disease, stage 3 unspecified: Secondary | ICD-10-CM | POA: Diagnosis present

## 2023-12-07 DIAGNOSIS — A4189 Other specified sepsis: Secondary | ICD-10-CM | POA: Diagnosis present

## 2023-12-07 DIAGNOSIS — Z716 Tobacco abuse counseling: Secondary | ICD-10-CM | POA: Diagnosis not present

## 2023-12-07 DIAGNOSIS — Z880 Allergy status to penicillin: Secondary | ICD-10-CM

## 2023-12-07 DIAGNOSIS — Z888 Allergy status to other drugs, medicaments and biological substances status: Secondary | ICD-10-CM

## 2023-12-07 LAB — RESP PANEL BY RT-PCR (RSV, FLU A&B, COVID)  RVPGX2
Influenza A by PCR: POSITIVE — AB
Influenza B by PCR: NEGATIVE
Resp Syncytial Virus by PCR: NEGATIVE
SARS Coronavirus 2 by RT PCR: NEGATIVE

## 2023-12-07 LAB — LACTIC ACID, PLASMA: Lactic Acid, Venous: 0.8 mmol/L (ref 0.5–1.9)

## 2023-12-07 LAB — COMPREHENSIVE METABOLIC PANEL
ALT: 41 U/L (ref 0–44)
AST: 61 U/L — ABNORMAL HIGH (ref 15–41)
Albumin: 3.6 g/dL (ref 3.5–5.0)
Alkaline Phosphatase: 58 U/L (ref 38–126)
Anion gap: 9 (ref 5–15)
BUN: 18 mg/dL (ref 8–23)
CO2: 28 mmol/L (ref 22–32)
Calcium: 8.1 mg/dL — ABNORMAL LOW (ref 8.9–10.3)
Chloride: 102 mmol/L (ref 98–111)
Creatinine, Ser: 1.03 mg/dL — ABNORMAL HIGH (ref 0.44–1.00)
GFR, Estimated: >60 mL/min (ref >60)
Glucose, Bld: 126 mg/dL — ABNORMAL HIGH (ref 70–99)
Potassium: 3.7 mmol/L (ref 3.5–5.1)
Sodium: 139 mmol/L (ref 135–145)
Total Bilirubin: 0.4 mg/dL (ref 0.0–1.2)
Total Protein: 6.8 g/dL (ref 6.5–8.1)

## 2023-12-07 LAB — CBC WITH DIFFERENTIAL/PLATELET
Abs Immature Granulocytes: 0.02 10*3/uL (ref 0.00–0.07)
Basophils Absolute: 0 10*3/uL (ref 0.0–0.1)
Basophils Relative: 1 %
Eosinophils Absolute: 0 10*3/uL (ref 0.0–0.5)
Eosinophils Relative: 0 %
HCT: 47.7 % — ABNORMAL HIGH (ref 36.0–46.0)
Hemoglobin: 15.3 g/dL — ABNORMAL HIGH (ref 12.0–15.0)
Immature Granulocytes: 1 %
Lymphocytes Relative: 20 %
Lymphs Abs: 0.7 10*3/uL (ref 0.7–4.0)
MCH: 29 pg (ref 26.0–34.0)
MCHC: 32.1 g/dL (ref 30.0–36.0)
MCV: 90.3 fL (ref 80.0–100.0)
Monocytes Absolute: 0.5 10*3/uL (ref 0.1–1.0)
Monocytes Relative: 13 %
Neutro Abs: 2.2 10*3/uL (ref 1.7–7.7)
Neutrophils Relative %: 65 %
Platelets: 85 10*3/uL — ABNORMAL LOW (ref 150–400)
RBC: 5.28 MIL/uL — ABNORMAL HIGH (ref 3.87–5.11)
RDW: 16 % — ABNORMAL HIGH (ref 11.5–15.5)
WBC: 3.4 10*3/uL — ABNORMAL LOW (ref 4.0–10.5)
nRBC: 0 % (ref 0.0–0.2)

## 2023-12-07 LAB — APTT: aPTT: 31 s (ref 24–36)

## 2023-12-07 LAB — CBG MONITORING, ED
Glucose-Capillary: 114 mg/dL — ABNORMAL HIGH (ref 70–99)
Glucose-Capillary: 152 mg/dL — ABNORMAL HIGH (ref 70–99)

## 2023-12-07 LAB — TROPONIN I (HIGH SENSITIVITY)
Troponin I (High Sensitivity): 87 ng/L — ABNORMAL HIGH (ref <18)
Troponin I (High Sensitivity): 71 ng/L — ABNORMAL HIGH (ref <18)

## 2023-12-07 LAB — PROTIME-INR
INR: 1.1 (ref 0.8–1.2)
Prothrombin Time: 13.9 s (ref 11.4–15.2)

## 2023-12-07 LAB — HIV ANTIBODY (ROUTINE TESTING W REFLEX): HIV Screen 4th Generation wRfx: NONREACTIVE

## 2023-12-07 LAB — PROCALCITONIN: Procalcitonin: <0.1 ng/mL

## 2023-12-07 LAB — BRAIN NATRIURETIC PEPTIDE: B Natriuretic Peptide: 1144.2 pg/mL — ABNORMAL HIGH (ref 0.0–100.0)

## 2023-12-07 MED ORDER — TORSEMIDE 20 MG PO TABS: 20.0000 mg | ORAL_TABLET | Freq: Every day | ORAL | Status: DC

## 2023-12-07 MED ORDER — ALPRAZOLAM 0.5 MG PO TABS
0.5000 mg | ORAL_TABLET | Freq: Every day | ORAL | Status: DC | PRN
  Administered 2023-12-08 – 2023-12-10 (×4): 0.5 mg via ORAL
  Filled 2023-12-07 (×4): qty 1

## 2023-12-07 MED ORDER — SODIUM CHLORIDE 0.9% FLUSH
3.0000 mL | Freq: Two times a day (BID) | INTRAVENOUS | Status: DC
  Administered 2023-12-07 – 2023-12-11 (×9): 3 mL via INTRAVENOUS

## 2023-12-07 MED ORDER — OSELTAMIVIR PHOSPHATE 75 MG PO CAPS
75.0000 mg | ORAL_CAPSULE | Freq: Once | ORAL | Status: AC
  Administered 2023-12-07: 75 mg via ORAL
  Filled 2023-12-07: qty 1

## 2023-12-07 MED ORDER — ENOXAPARIN SODIUM 40 MG/0.4ML IJ SOSY
40.0000 mg | PREFILLED_SYRINGE | INTRAMUSCULAR | Status: DC
  Administered 2023-12-07 – 2023-12-10 (×4): 40 mg via SUBCUTANEOUS
  Filled 2023-12-07 (×5): qty 0.4

## 2023-12-07 MED ORDER — SODIUM CHLORIDE 0.9% FLUSH: 3.0000 mL | INTRAVENOUS | Status: DC | PRN

## 2023-12-07 MED ORDER — INSULIN ASPART 100 UNIT/ML IJ SOLN
0.0000 [IU] | Freq: Three times a day (TID) | INTRAMUSCULAR | Status: DC
  Administered 2023-12-08 – 2023-12-09 (×4): 3 [IU] via SUBCUTANEOUS
  Administered 2023-12-10: 2 [IU] via SUBCUTANEOUS
  Administered 2023-12-10 – 2023-12-11 (×2): 5 [IU] via SUBCUTANEOUS
  Filled 2023-12-07 (×7): qty 1

## 2023-12-07 MED ORDER — ACETAMINOPHEN 325 MG PO TABS
650.0000 mg | ORAL_TABLET | ORAL | Status: DC | PRN
  Administered 2023-12-08 – 2023-12-09 (×2): 650 mg via ORAL
  Filled 2023-12-07 (×2): qty 2

## 2023-12-07 MED ORDER — SODIUM CHLORIDE 0.9 % IV SOLN: 250.0000 mL | INTRAVENOUS | Status: AC | PRN

## 2023-12-07 MED ORDER — ONDANSETRON HCL 4 MG/2ML IJ SOLN: 4.0000 mg | Freq: Four times a day (QID) | INTRAMUSCULAR | Status: DC | PRN

## 2023-12-07 MED ORDER — IPRATROPIUM-ALBUTEROL 0.5-2.5 (3) MG/3ML IN SOLN
3.0000 mL | Freq: Four times a day (QID) | RESPIRATORY_TRACT | Status: DC
  Administered 2023-12-07 – 2023-12-09 (×7): 3 mL via RESPIRATORY_TRACT
  Filled 2023-12-07 (×7): qty 3

## 2023-12-07 MED ORDER — SODIUM CHLORIDE 0.9 % IV SOLN
2.0000 g | INTRAVENOUS | Status: DC
  Administered 2023-12-07: 2 g via INTRAVENOUS
  Filled 2023-12-07: qty 20

## 2023-12-07 MED ORDER — MONTELUKAST SODIUM 10 MG PO TABS
10.0000 mg | ORAL_TABLET | Freq: Every day | ORAL | Status: DC
  Administered 2023-12-07 – 2023-12-10 (×4): 10 mg via ORAL
  Filled 2023-12-07 (×4): qty 1

## 2023-12-07 MED ORDER — ACETAMINOPHEN 500 MG PO TABS
1000.0000 mg | ORAL_TABLET | Freq: Once | ORAL | Status: AC
  Administered 2023-12-07: 1000 mg via ORAL
  Filled 2023-12-07: qty 2

## 2023-12-07 MED ORDER — POLYETHYLENE GLYCOL 3350 17 G PO PACK: 17.0000 g | PACK | Freq: Two times a day (BID) | ORAL | Status: DC | PRN

## 2023-12-07 MED ORDER — LORATADINE 10 MG PO TABS
10.0000 mg | ORAL_TABLET | Freq: Every day | ORAL | Status: DC
  Administered 2023-12-08 – 2023-12-11 (×4): 10 mg via ORAL
  Filled 2023-12-07 (×4): qty 1

## 2023-12-07 MED ORDER — ALBUTEROL SULFATE (2.5 MG/3ML) 0.083% IN NEBU: 2.5000 mg | INHALATION_SOLUTION | Freq: Four times a day (QID) | RESPIRATORY_TRACT | Status: DC | PRN

## 2023-12-07 MED ORDER — POTASSIUM CHLORIDE CRYS ER 20 MEQ PO TBCR
20.0000 meq | EXTENDED_RELEASE_TABLET | Freq: Two times a day (BID) | ORAL | Status: DC
  Administered 2023-12-08 – 2023-12-11 (×7): 20 meq via ORAL
  Filled 2023-12-07 (×7): qty 1

## 2023-12-07 MED ORDER — TORSEMIDE 20 MG PO TABS
50.0000 mg | ORAL_TABLET | Freq: Two times a day (BID) | ORAL | Status: DC
Start: 2023-12-08 — End: 2023-12-07

## 2023-12-07 MED ORDER — BUDESONIDE 0.5 MG/2ML IN SUSP
2.0000 mg | Freq: Two times a day (BID) | RESPIRATORY_TRACT | Status: DC
  Administered 2023-12-07 – 2023-12-10 (×7): 2 mg via RESPIRATORY_TRACT
  Filled 2023-12-07 (×7): qty 8

## 2023-12-07 MED ORDER — BISACODYL 5 MG PO TBEC
5.0000 mg | DELAYED_RELEASE_TABLET | Freq: Once | ORAL | Status: AC
  Administered 2023-12-07: 5 mg via ORAL
  Filled 2023-12-07: qty 1

## 2023-12-07 MED ORDER — METFORMIN HCL ER 500 MG PO TB24: 500.0000 mg | ORAL_TABLET | Freq: Every day | ORAL | Status: DC

## 2023-12-07 MED ORDER — IPRATROPIUM-ALBUTEROL 0.5-2.5 (3) MG/3ML IN SOLN
6.0000 mL | Freq: Once | RESPIRATORY_TRACT | Status: AC
  Administered 2023-12-07: 6 mL via RESPIRATORY_TRACT
  Filled 2023-12-07: qty 6

## 2023-12-07 MED ORDER — SPIRONOLACTONE 12.5 MG HALF TABLET
12.5000 mg | ORAL_TABLET | Freq: Every day | ORAL | Status: DC
  Administered 2023-12-08 – 2023-12-11 (×4): 12.5 mg via ORAL
  Filled 2023-12-07 (×4): qty 1

## 2023-12-07 MED ORDER — SODIUM CHLORIDE 0.9 % IV SOLN
500.0000 mg | INTRAVENOUS | Status: DC
  Administered 2023-12-07: 500 mg via INTRAVENOUS
  Filled 2023-12-07: qty 5

## 2023-12-07 MED ORDER — OSELTAMIVIR PHOSPHATE 75 MG PO CAPS
75.0000 mg | ORAL_CAPSULE | Freq: Two times a day (BID) | ORAL | Status: DC
  Administered 2023-12-08 (×2): 75 mg via ORAL
  Filled 2023-12-07 (×3): qty 1

## 2023-12-07 MED ORDER — LACTULOSE 10 GM/15ML PO SOLN
30.0000 g | Freq: Three times a day (TID) | ORAL | Status: AC
  Administered 2023-12-07: 30 g via ORAL
  Filled 2023-12-07 (×2): qty 60

## 2023-12-07 MED ORDER — CARVEDILOL 12.5 MG PO TABS
12.5000 mg | ORAL_TABLET | Freq: Two times a day (BID) | ORAL | Status: DC
  Administered 2023-12-08 – 2023-12-11 (×7): 12.5 mg via ORAL
  Filled 2023-12-07 (×4): qty 1
  Filled 2023-12-07: qty 2
  Filled 2023-12-07 (×2): qty 1

## 2023-12-07 MED ORDER — PREDNISONE 20 MG PO TABS
40.0000 mg | ORAL_TABLET | Freq: Every day | ORAL | Status: DC
  Administered 2023-12-08 – 2023-12-11 (×4): 40 mg via ORAL
  Filled 2023-12-07 (×4): qty 2

## 2023-12-07 MED ORDER — ASPIRIN 81 MG PO TBEC
81.0000 mg | DELAYED_RELEASE_TABLET | Freq: Every day | ORAL | Status: DC
  Administered 2023-12-08 – 2023-12-11 (×4): 81 mg via ORAL
  Filled 2023-12-07 (×4): qty 1

## 2023-12-07 MED ORDER — FLUTICASONE PROPIONATE 50 MCG/ACT NA SUSP
2.0000 | Freq: Every day | NASAL | Status: DC
  Administered 2023-12-09: 2 via NASAL
  Filled 2023-12-07: qty 16

## 2023-12-07 MED ORDER — GUAIFENESIN ER 600 MG PO TB12
1200.0000 mg | ORAL_TABLET | Freq: Two times a day (BID) | ORAL | Status: DC
  Administered 2023-12-07 – 2023-12-11 (×8): 1200 mg via ORAL
  Filled 2023-12-07 (×10): qty 2

## 2023-12-07 MED ORDER — FUROSEMIDE 10 MG/ML IJ SOLN
40.0000 mg | Freq: Once | INTRAMUSCULAR | Status: AC
  Administered 2023-12-07: 40 mg via INTRAVENOUS
  Filled 2023-12-07: qty 4

## 2023-12-07 MED ORDER — LEVOTHYROXINE SODIUM 137 MCG PO TABS
137.0000 ug | ORAL_TABLET | Freq: Every day | ORAL | Status: DC
  Administered 2023-12-08 – 2023-12-11 (×4): 137 ug via ORAL
  Filled 2023-12-07 (×4): qty 1

## 2023-12-07 MED ORDER — LACTATED RINGERS IV BOLUS
250.0000 mL | Freq: Once | INTRAVENOUS | Status: AC
  Administered 2023-12-07: 250 mL via INTRAVENOUS

## 2023-12-07 MED ORDER — SACUBITRIL-VALSARTAN 24-26 MG PO TABS
1.0000 | ORAL_TABLET | Freq: Two times a day (BID) | ORAL | Status: DC
  Administered 2023-12-07 – 2023-12-10 (×6): 1 via ORAL
  Filled 2023-12-07 (×6): qty 1

## 2023-12-07 NOTE — ED Provider Notes (Signed)
 [facility] Provider Note    Event Date/Time   First MD Initiated Contact with Patient 12/07/23 1259     (approximate)   History   Shortness of Breath   HPI  Heather Choi is a 63 y.o. female past medical history significant for COPD, CKD, diabetes, hypothyroidism, heart failure, who presents to the emergency department from [facility] clinic for acute hypoxia.  Patient states that she has not been feeling well for the past couple of days.  Complaining of cough, shortness of breath and subjective fever.  When patient arrived to urgent care from work she was having difficulty breathing.  She was found to be 70% on room air and was placed on 4 L nasal cannula with improved to 93%.  Patient received IV Solu-Medrol and a DuoNeb prior to arrival.  Also complaining of some abdominal distention and discomfort which she states is not new and has been ongoing for multiple years.  Denies any dysuria, urinary urgency or frequency.  Denies any history of DVT or PE.  Denies nausea or vomiting.  Denies any episodes of diarrhea or constipation.  Ongoing tobacco use of approximately 10 cigarettes a day.     Physical Exam   Triage Vital Signs: ED Triage Vitals  Encounter Vitals Group     BP      Systolic BP Percentile      Diastolic BP Percentile      Pulse      Resp      Temp      Temp src      SpO2      Weight      Height      Head Circumference      Peak Flow      Pain Score      Pain Loc      Pain Education      Exclude from Growth Chart     Most recent vital signs: Vitals:   12/07/23 1400 12/07/23 1430  BP: 126/79 110/70  Pulse: 89 80  Resp: (!) 24 20  Temp:    SpO2: 100% 100%    Physical Exam Constitutional:      Appearance: She is well-developed.  HENT:     Head: Atraumatic.  Eyes:     Extraocular Movements: Extraocular movements intact.     Conjunctiva/sclera: Conjunctivae normal.     Pupils: Pupils are equal, round, and reactive to light.   Cardiovascular:     Rate and Rhythm: Regular rhythm. Tachycardia present.  Pulmonary:     Effort: Tachypnea and respiratory distress present.     Breath sounds: Wheezing present.     Comments: 94% on 4 L nasal cannula Abdominal:     General: There is no distension.     Tenderness: There is no abdominal tenderness.  Musculoskeletal:        General: Normal range of motion.     Cervical back: Normal range of motion.     Right lower leg: No edema.     Left lower leg: No edema.  Skin:    General: Skin is warm.     Capillary Refill: Capillary refill takes less than 2 seconds.  Neurological:     General: No focal deficit present.     Mental Status: She is alert. Mental status is at baseline.  Psychiatric:        Mood and Affect: Mood normal.     IMPRESSION / MDM / ASSESSMENT AND PLAN /  ED COURSE  I reviewed the triage vital signs and the nursing notes.  On chart review patient was evaluated in urgent care earlier today.  Was diagnosed with concern for a CHF exacerbation.  Was found to be hypoxic with an oxygen saturation in the 70s.  On arrival to the emergency department found to be febrile to 102, tachycardic and tachypneic.  Hypoxic prior to arrival and on 4 L nasal cannula.  Differential diagnosis including COPD exacerbation, community-acquired pneumonia, viral illness including COVID/influenza   Patient received IV Solu-Medrol and DuoNeb treatment prior to arrival.  Patient placed on another DuoNeb treatment.  Felt that 30 cc/kg of IV fluids may be detrimental given her history of heart failure, will give a 250 bolus and reevaluate.  Given antipyretics.  EKG  I, Corena Herter, the attending physician, personally viewed and interpreted this ECG.  Pietro Cassis, the attending physician, personally viewed and interpreted this ECG.   Rate: Normal  Rhythm: Normal sinus  Axis: Normal  Intervals: Normal  ST&T Change: None  No tachycardic or bradycardic dysrhythmias while  on cardiac telemetry.  With concern for possible lead reversal.  Signs of atrial enlargement.  QTc prolonged at 568.  Mild ST depression to the lateral leads.  No significant ST elevation.  No tachycardic or bradycardic dysrhythmias while on cardiac telemetry.  RADIOLOGY I independently reviewed imaging, my interpretation of imaging: Chest x-ray with pacemaker in place.  Cardiomegaly.  Findings concerning for multifocal pneumonia versus pulmonary edema  LABS (all labs ordered are listed, but only abnormal results are displayed) Labs interpreted as -    Labs Reviewed  RESP PANEL BY RT-PCR (RSV, FLU A&B, COVID)  RVPGX2 - Abnormal; Notable for the following components:      Result Value   Influenza A by PCR POSITIVE (*)    All other components within normal limits  COMPREHENSIVE METABOLIC PANEL - Abnormal; Notable for the following components:   Glucose, Bld 126 (*)    Creatinine, Ser 1.03 (*)    Calcium 8.1 (*)    AST 61 (*)    All other components within normal limits  CBC WITH DIFFERENTIAL/PLATELET - Abnormal; Notable for the following components:   WBC 3.4 (*)    RBC 5.28 (*)    Hemoglobin 15.3 (*)    HCT 47.7 (*)    RDW 16.0 (*)    Platelets 85 (*)    All other components within normal limits  BRAIN NATRIURETIC PEPTIDE - Abnormal; Notable for the following components:   B Natriuretic Peptide 1,144.2 (*)    All other components within normal limits  BLOOD GAS, VENOUS - Abnormal; Notable for the following components:   pCO2, Ven 64 (*)    pO2, Ven <31 (*)    Bicarbonate 31.5 (*)    Acid-Base Excess 3.2 (*)    All other components within normal limits  TROPONIN I (HIGH SENSITIVITY) - Abnormal; Notable for the following components:   Troponin I (High Sensitivity) 87 (*)    All other components within normal limits  CULTURE, BLOOD (ROUTINE X 2)  CULTURE, BLOOD (ROUTINE X 2)  LACTIC ACID, PLASMA  PROTIME-INR  APTT  LACTIC ACID, PLASMA  TROPONIN I (HIGH SENSITIVITY)      MDM  Influenza came back positive for flu A.  Significantly elevated BNP.  Hypercarbic with pH of 3.0.  Patient complaining of some mild blurred vision, obtaining a CT scan of her head but otherwise has a GCS of 15 and a nonfocal  exam.  Extraocular movements are intact with no double vision.  Given a 250 bolus however after findings concerning for heart failure provide no further fluids at this time.  Given Tamiflu.  Will do a trial of BiPAP given her hypercarbia and signs of respiratory distress with fluid overload.  Consulted hospitalist for admission for acute hypoxic respiratory failure in the setting of COPD exacerbation, heart failure exacerbation and influenza A.     PROCEDURES:  Critical Care performed: yes  .Critical Care  Performed by: Corena Herter, MD Authorized by: Corena Herter, MD   Critical care provider statement:    Critical care time (minutes):  40   Critical care time was exclusive of:  Separately billable procedures and treating other patients   Critical care was necessary to treat or prevent imminent or life-threatening deterioration of the following conditions:  Respiratory failure   Critical care was time spent personally by me on the following activities:  Development of treatment plan with patient or surrogate, discussions with consultants, evaluation of patient's response to treatment, examination of patient, ordering and review of laboratory studies, ordering and review of radiographic studies, ordering and performing treatments and interventions, pulse oximetry, re-evaluation of patient's condition and review of old charts   Care discussed with: admitting provider     Patient's presentation is most consistent with acute presentation with potential threat to life or bodily function.   MEDICATIONS ORDERED IN ED: Medications  oseltamivir (TAMIFLU) capsule 75 mg (has no administration in time range)  oseltamivir (TAMIFLU) capsule 75 mg (has no  administration in time range)  furosemide (LASIX) injection 40 mg (has no administration in time range)  ipratropium-albuterol (DUONEB) 0.5-2.5 (3) MG/3ML nebulizer solution 6 mL (6 mLs Nebulization Given 12/07/23 1346)  acetaminophen (TYLENOL) tablet 1,000 mg (1,000 mg Oral Given 12/07/23 1346)  lactated ringers bolus 250 mL (250 mLs Intravenous New Bag/Given 12/07/23 1455)    FINAL CLINICAL IMPRESSION(S) / ED DIAGNOSES   Final diagnoses:  COPD exacerbation (HCC)  Acute hypoxic respiratory failure (HCC)  Influenza A  Acute on chronic systolic congestive heart failure (HCC)     Rx / DC Orders   ED Discharge Orders     None        Note:  This document was prepared using Dragon voice recognition software and may include unintentional dictation errors.   Corena Herter, MD 12/07/23 [phone]

## 2023-12-07 NOTE — ED Triage Notes (Signed)
 Patient presents to ED via [facility] from [facility] for SOB, cough, and congestion. Reports congestion and difficulty breathing has been going on for a couple of weeks.  Patient's O2 70% on RA at [facility]. Placed on 4L which brought her to 93%.  [facility] physician also noted abdominal distention, which patient reports has been occurring for "a few years".  [facility] administered albuterol, duoneb and 125mg  solumedrol en route.   Pmhx COPD and CHF.

## 2023-12-07 NOTE — ED Notes (Signed)
Called pharmacy for medication verification.

## 2023-12-07 NOTE — H&P (Addendum)
 History and Physical    Heather Choi XBM:[id number] DOB: 11/15/1960 DOA: 12/07/2023  PCP: Kandyce Rud, MD (Confirm with patient/family/NH records and if not entered, this has to be entered at [facility] point of entry) Patient coming from: Home  I have personally briefly reviewed patient's old medical records in [facility]  Chief Complaint: Cough, wheezing, fever, shortness of breath  HPI: Heather Choi is a 63 y.o. female with medical history significant of asthma/COPD, chronic HFrEF with LVEF less than 20%, HTN, IDDM, presented 2 days of cough wheezing fever shortness of breath.  Symptoms started yesterday, patient started develop sore throat then productive cough with small amount of clear phlegm, also has had subjective fever last night.  Overnight patient developed more shortness of breath and wheezing.  Denied any chest pain no nauseous vomiting.  ED Course: Fever 102.3, borderline tachycardia blood pressure 111/100, O2 saturation 81% on room air and stabilized on 4 L O2 saturation ranging 95-97%.  Chest x-ray showed bilateral pulmonary congestion.  Blood work showed hemoglobin 15.2, WBC 3.4, platelet 85, creatinine 1.0 BUN 18.  VBG showed 7.30/60/31  Patient was given Tamiflu and Lasix 40 mg x 1 in the ED.  Review of Systems: As per HPI otherwise 14 point review of systems negative.    Past Medical History:  Diagnosis Date   Asthma    Bell's palsy    fingers tingling in cold weather   CHF (congestive heart failure) (HCC)    COPD (chronic obstructive pulmonary disease) (HCC)    Diabetes mellitus without complication (HCC)    Enlarged heart    Hypertension    Thyroid disease     Past Surgical History:  Procedure Laterality Date   ABDOMINAL HYSTERECTOMY     CHOLECYSTECTOMY     IMPLANTABLE CARDIOVERTER DEFIBRILLATOR IMPLANT     laparoscopic bilateral oophorectomy with removal of adnexal mass and lysis of adhesions  11/20/2017   LAPAROSCOPY     with removal of adnexal  structure     reports that she has been smoking cigarettes. She has never used smokeless tobacco. She reports that she does not currently use alcohol. She reports that she does not use drugs.  Allergies  Allergen Reactions   Clindamycin/Lincomycin     Burning sensastion   Amlodipine Other (See Comments) and Itching   Penicillins Other (See Comments)    Has patient had a PCN reaction causing immediate rash, facial/tongue/throat swelling, SOB or lightheadedness with hypotension: Yes Has patient had a PCN reaction causing severe rash involving mucus membranes or skin necrosis: No Has patient had a PCN reaction that required hospitalization: No Has patient had a PCN reaction occurring within the last 10 years: Yes If all of the above answers are "NO", then may proceed with Cephalosporin use.   Hydrochlorothiazide Rash    Family History  Problem Relation Age of Onset   Heart failure Father    Heart attack Mother      Prior to Admission medications   Medication Sig Start Date End Date Taking? Authorizing Provider  fluticasone (FLONASE) 50 MCG/ACT nasal spray Place 2 sprays into both nostrils daily. 07/25/23 07/24/24 Yes [provider]  montelukast (SINGULAIR) 10 MG tablet Take 1 tablet by mouth at bedtime. 07/30/23 07/29/24 Yes [provider]  torsemide (DEMADEX) 10 MG tablet Take 5 tablets (50 mg total) by mouth 2 (two) times daily. Patient taking differently: Take 20 mg by mouth at bedtime. 07/23/22  Yes Regalado, Belkys A, MD  albuterol (VENTOLIN  HFA) 108 (90 Base) MCG/ACT inhaler Inhale 2 puffs into the lungs every 6 (six) hours as needed for wheezing or shortness of breath. 07/24/22   Regalado, Belkys A, MD  ALPRAZolam (XANAX) 0.5 MG tablet Take 0.5 mg by mouth daily as needed for sleep.    [provider]  aspirin EC 81 MG tablet Take 81 mg by mouth daily.     [provider]  carvedilol (COREG) 12.5 MG tablet Take 12.5 mg by mouth 2 (two)  times daily with a meal.    [provider]  cetirizine (ZYRTEC) 10 MG tablet Take 10 mg by mouth daily.    [provider]  guaiFENesin (MUCINEX) 600 MG 12 hr tablet Take 1 tablet (600 mg total) by mouth 2 (two) times daily. 07/23/22   Regalado, Belkys A, MD  insulin aspart protamine- aspart (NOVOLOG MIX 70/30) (70-30) 100 UNIT/ML injection Inject 7 Units into the skin 2 (two) times daily with a meal.    [provider]  levothyroxine (SYNTHROID) 137 MCG tablet Take 137 mcg by mouth daily. 03/05/14   [provider]  Liniments (SALONPAS PAIN RELIEF PATCH EX) Apply 1 patch topically daily as needed (pain).    [provider]  metFORMIN (GLUCOPHAGE-XR) 500 MG 24 hr tablet 500 mg daily with supper. 02/01/20   [provider]  Multiple Vitamin (MULTIVITAMIN WITH MINERALS) TABS tablet Take 1 tablet by mouth daily. 07/24/22   Regalado, Belkys A, MD  nicotine (NICODERM CQ - DOSED IN MG/24 HOURS) 14 mg/24hr patch Place 1 patch (14 mg total) onto the skin daily. 07/24/22   Regalado, Belkys A, MD  potassium chloride SA (K-DUR,KLOR-CON) 20 MEQ tablet Take 20 mEq by mouth 2 (two) times daily.  04/29/15   [provider]  sacubitril-valsartan (ENTRESTO) 24-26 MG Take 1 tablet by mouth 2 (two) times daily.    [provider]  spironolactone (ALDACTONE) 25 MG tablet Take 12.5 mg by mouth daily.    [provider]    Physical Exam: Vitals:   12/07/23 1314 12/07/23 1330 12/07/23 1400 12/07/23 1430  BP:  123/75 126/79 110/70  Pulse:  96 89 80  Resp:  (!) 37 (!) 24 20  Temp:      TempSrc:      SpO2:  (!) 81% 100% 100%  Height: 5\' 5"  (1.651 m)       Constitutional: NAD, calm, comfortable Vitals:   12/07/23 1314 12/07/23 1330 12/07/23 1400 12/07/23 1430  BP:  123/75 126/79 110/70  Pulse:  96 89 80  Resp:  (!) 37 (!) 24 20  Temp:      TempSrc:      SpO2:  (!) 81% 100% 100%  Height: 5\' 5"  (1.651 m)      Eyes: PERRL, lids and  conjunctivae normal ENMT: Mucous membranes are moist. Posterior pharynx clear of any exudate or lesions.Normal dentition.  Neck: normal, supple, no masses, no thyromegaly Respiratory: clear to auscultation bilaterally, diffused wheezing, fine crackles on bilateral lower fields, increasing respiratory effort. No accessory muscle use.  Cardiovascular: Regular rate and rhythm, no murmurs / rubs / gallops. No extremity edema. 2+ pedal pulses. No carotid bruits.  Abdomen: no tenderness, no masses palpated. No hepatosplenomegaly. Bowel sounds positive.  Musculoskeletal: no clubbing / cyanosis. No joint deformity upper and lower extremities. Good ROM, no contractures. Normal muscle tone.  Skin: no rashes, lesions, ulcers. No induration Neurologic: CN 2-12 grossly intact. Sensation intact, DTR normal. Strength 5/5 in all 4.  Psychiatric: Normal judgment and insight. Alert and oriented x 3. Normal mood.     Labs on Admission: I have personally reviewed following labs and imaging studies  CBC: Recent Labs  Lab 12/07/23 1331  WBC 3.4*  NEUTROABS 2.2  HGB 15.3*  HCT 47.7*  MCV 90.3  PLT 85*   Basic Metabolic Panel: Recent Labs  Lab 12/07/23 1331  NA 139  K 3.7  CL 102  CO2 28  GLUCOSE 126*  BUN 18  CREATININE 1.03*  CALCIUM 8.1*   GFR: CrCl cannot be calculated (Unknown ideal weight.). Liver Function Tests: Recent Labs  Lab 12/07/23 1331  AST 61*  ALT 41  ALKPHOS 58  BILITOT 0.4  PROT 6.8  ALBUMIN 3.6   No results for input(s): "LIPASE", "AMYLASE" in the last 168 hours. No results for input(s): "AMMONIA" in the last 168 hours. Coagulation Profile: Recent Labs  Lab 12/07/23 1331  INR 1.1   Cardiac Enzymes: No results for input(s): "CKTOTAL", "CKMB", "CKMBINDEX", "TROPONINI" in the last 168 hours. BNP (last 3 results) No results for input(s): "PROBNP" in the last 8760 hours. HbA1C: No results for input(s): "HGBA1C" in the last 72 hours. CBG: No results for  input(s): "GLUCAP" in the last 168 hours. Lipid Profile: No results for input(s): "CHOL", "HDL", "LDLCALC", "TRIG", "CHOLHDL", "LDLDIRECT" in the last 72 hours. Thyroid Function Tests: No results for input(s): "TSH", "T4TOTAL", "FREET4", "T3FREE", "THYROIDAB" in the last 72 hours. Anemia Panel: No results for input(s): "VITAMINB12", "FOLATE", "FERRITIN", "TIBC", "IRON", "RETICCTPCT" in the last 72 hours. Urine analysis:    Component Value Date/Time   COLORURINE YELLOW (A) 09/30/2018 1848   APPEARANCEUR CLOUDY (A) 09/30/2018 1848   APPEARANCEUR Cloudy 09/01/2014 1009   LABSPEC 1.008 09/30/2018 1848   LABSPEC 1.027 09/01/2014 1009   PHURINE 7.0 09/30/2018 1848   GLUCOSEU 150 (A) 09/30/2018 1848   GLUCOSEU Negative 09/01/2014 1009   HGBUR SMALL (A) 09/30/2018 1848   BILIRUBINUR NEGATIVE 09/30/2018 1848   BILIRUBINUR Negative 09/01/2014 1009   KETONESUR NEGATIVE 09/30/2018 1848   PROTEINUR 100 (A) 09/30/2018 1848   NITRITE NEGATIVE 09/30/2018 1848   LEUKOCYTESUR NEGATIVE 09/30/2018 1848   LEUKOCYTESUR Negative 09/01/2014 1009    Radiological Exams on Admission: No results found.  EKG: Independently reviewed.  Sinus rhythm, no acute ST changes.  Prolonged QTc 568  Assessment/Plan Principal Problem:   CHF (congestive heart failure) (HCC) Active Problems:   COPD with acute exacerbation (HCC)   Acute on chronic congestive heart failure (HCC)   Influenza A with pneumonia  (please populate well all problems here in Problem List. (For example, if patient is on BP meds at home and you resume or decide to hold them, it is a problem that needs to be her. Same for CAD, COPD, HLD and so on)  Sepsis, without acute endorgan damage -Sepsis evidenced by new onset of hypoxia, leukopenia and fever, source of infection is influenza A pneumonia. -Continue Tamiflu -Clinically patient has fluid overload, mild, no indication for fluid resuscitation. -Other DDx, patient has a PPM with increasing  risk of bacteremia, blood culture sent to rule out bacteremia.  Will cover for CAP with ceftriaxone and azithromycin until bacteremia ruled out.  Acute hypoxic respiratory failure  acute COPD exacerbation Influenza A pneumonia -Continue Tamiflu -P.o. steroid for COPD exacerbation -And Pulmicort, DuoNeb every 6 hours and as needed albuterol -Incentive spirometry and flutter valve  Of acute on chronic HFrEF decompensation -1 dose of IV Lasix given in ED -Expect patient resume p.o.  diuresis as early as tomorrow.  Will repeat chest x-ray for tomorrow morning -Continue spironolactone, Entresto.  Leukopenia and thrombocytopenia -Probably related to acute influenza A infection.  IDDM -Continue metformin -SSI  Prolonged Qtc -No significant arrhythmia on telemonitoring.  Repeat EKG tomorrow  DVT prophylaxis: Heparin subcu Code Status: Full Family Communication: None at bedside Disposition Plan: Patient sick with sepsis from influenza A pneumonia as well as CHF decompensation and COPD exacerbation requiring IV diuresis, additional oxygen and close monitoring, expect more than 2 midnight hospital stay Consults called: None Admission status: Telemetry admission   Emeline General MD [facility] Pager [phone] 12/07/2023, 3:57 PM

## 2023-12-08 ENCOUNTER — Inpatient Hospital Stay

## 2023-12-08 DIAGNOSIS — J101 Influenza due to other identified influenza virus with other respiratory manifestations: Secondary | ICD-10-CM

## 2023-12-08 DIAGNOSIS — I5023 Acute on chronic systolic (congestive) heart failure: Secondary | ICD-10-CM

## 2023-12-08 DIAGNOSIS — A419 Sepsis, unspecified organism: Secondary | ICD-10-CM

## 2023-12-08 DIAGNOSIS — J9601 Acute respiratory failure with hypoxia: Secondary | ICD-10-CM

## 2023-12-08 DIAGNOSIS — J441 Chronic obstructive pulmonary disease with (acute) exacerbation: Secondary | ICD-10-CM | POA: Diagnosis not present

## 2023-12-08 LAB — GLUCOSE, CAPILLARY
Glucose-Capillary: 155 mg/dL — ABNORMAL HIGH (ref 70–99)
Glucose-Capillary: 133 mg/dL — ABNORMAL HIGH (ref 70–99)

## 2023-12-08 LAB — BLOOD GAS, VENOUS
Acid-Base Excess: 3.2 mmol/L — ABNORMAL HIGH (ref 0.0–2.0)
Bicarbonate: 31.5 mmol/L — ABNORMAL HIGH (ref 20.0–28.0)
O2 Saturation: 38.8 %
Patient temperature: 37
pCO2, Ven: 64 mmHg — ABNORMAL HIGH (ref 44–60)
pH, Ven: 7.3 (ref 7.25–7.43)

## 2023-12-08 LAB — BASIC METABOLIC PANEL
Anion gap: 6 (ref 5–15)
BUN: 16 mg/dL (ref 8–23)
CO2: 29 mmol/L (ref 22–32)
Calcium: 8 mg/dL — ABNORMAL LOW (ref 8.9–10.3)
Chloride: 103 mmol/L (ref 98–111)
Creatinine, Ser: 1.05 mg/dL — ABNORMAL HIGH (ref 0.44–1.00)
GFR, Estimated: >60 mL/min (ref >60)
Glucose, Bld: 114 mg/dL — ABNORMAL HIGH (ref 70–99)
Potassium: 3.8 mmol/L (ref 3.5–5.1)
Sodium: 138 mmol/L (ref 135–145)

## 2023-12-08 LAB — CBG MONITORING, ED
Glucose-Capillary: 108 mg/dL — ABNORMAL HIGH (ref 70–99)
Glucose-Capillary: 180 mg/dL — ABNORMAL HIGH (ref 70–99)

## 2023-12-08 MED ORDER — FUROSEMIDE 10 MG/ML IJ SOLN
40.0000 mg | Freq: Once | INTRAMUSCULAR | Status: AC
  Administered 2023-12-08: 40 mg via INTRAVENOUS
  Filled 2023-12-08: qty 4

## 2023-12-08 NOTE — Evaluation (Signed)
 Occupational Therapy Evaluation Patient Details Name: KIMORI TARTAGLIA MRN: [id number] DOB: 1960/12/20 Today's Date: 12/08/2023   History of Present Illness   63 y.o. female with medical history significant of asthma/COPD, chronic HFrEF with LVEF less than 20%, HTN, IDDM, presented 2 days of cough wheezing fever shortness of breath.     Clinical Impressions Patient presenting with decreased Ind in self care,balance, functional mobility/transfers, endurance, and safety awareness. Pt currently on 2Ls O2 via [place]. Pt reports, " I feel like crap". Pt lives at home with multiple family members and reports being Ind at baseline and working full time.  Patient initially min guard progressing to supervision during session for functional transfers and toileting needs. Pt does fatigue quickly secondary to not feeling well and returns to bed at end of session. Patient will benefit from acute OT to increase overall independence in the areas of ADLs, functional mobility,and safety awareness in order to safely discharge.     If plan is discharge home, recommend the following:   A little help with walking and/or transfers;A little help with bathing/dressing/bathroom;Assist for transportation     Functional Status Assessment   Patient has had a recent decline in their functional status and demonstrates the ability to make significant improvements in function in a reasonable and predictable amount of time.     Equipment Recommendations   None recommended by OT      Precautions/Restrictions   Precautions Precautions: Fall     Mobility Bed Mobility Overal bed mobility: Modified Independent                  Transfers Overall transfer level: Needs assistance Equipment used: 1 person hand held assist Transfers: Sit to/from Stand Sit to Stand: Contact guard assist                  Balance Overall balance assessment: Needs assistance Sitting-balance support: Feet supported, No  upper extremity supported Sitting balance-Leahy Scale: Good     Standing balance support: During functional activity Standing balance-Leahy Scale: Fair                             ADL either performed or assessed with clinical judgement   ADL Overall ADL's : Needs assistance/impaired                         Toilet Transfer: Contact guard assist;BSC/3in1   Toileting- Clothing Manipulation and Hygiene: Contact guard assist;Sit to/from stand               Vision Patient Visual Report: No change from baseline              Pertinent Vitals/Pain Pain Assessment Pain Assessment: No/denies pain     Extremity/Trunk Assessment Upper Extremity Assessment Upper Extremity Assessment: Overall WFL for tasks assessed   Lower Extremity Assessment Lower Extremity Assessment: Overall WFL for tasks assessed       Communication Communication Communication: No apparent difficulties   Cognition Arousal: Alert Behavior During Therapy: WFL for tasks assessed/performed Cognition: No apparent impairments                               Following commands: Intact       Cueing  General Comments   Cueing Techniques: Verbal cues              Home Living  Family/patient expects to be discharged to:: Private residence Living Arrangements: Other relatives Available Help at Discharge: Family;Available 24 hours/day Type of Home: House Home Access: Stairs to enter [employer] of Steps: 4 Entrance Stairs-Rails: Right Home Layout: One level     Bathroom Shower/Tub: Tub/shower unit         Home Equipment: None          Prior Functioning/Environment Prior Level of Function : Independent/Modified Independent                    OT Problem List: Decreased strength;Decreased activity tolerance;Decreased cognition;Cardiopulmonary status limiting activity   OT Treatment/Interventions: Self-care/ADL training;Therapeutic  exercise;Balance training;Energy conservation;Therapeutic activities      OT Goals(Current goals can be found in the care plan section)   Acute Rehab OT Goals Patient Stated Goal: to go home and feel better OT Goal Formulation: With patient Time For Goal Achievement: 12/22/23 Potential to Achieve Goals: Fair ADL Goals Pt Will Perform Grooming: Independently;standing Pt Will Perform Lower Body Dressing: Independently;sit to/from stand Pt Will Transfer to Toilet: Independently;ambulating Pt Will Perform Toileting - Clothing Manipulation and hygiene: Independently;sit to/from stand   OT Frequency:  Min 1X/week       AM-PAC OT "6 Clicks" Daily Activity     Outcome Measure Help from another person eating meals?: None Help from another person taking care of personal grooming?: None Help from another person toileting, which includes using toliet, bedpan, or urinal?: A Little Help from another person bathing (including washing, rinsing, drying)?: A Little Help from another person to put on and taking off regular upper body clothing?: None Help from another person to put on and taking off regular lower body clothing?: A Little 6 Click Score: 21   End of Session Equipment Utilized During Treatment: Oxygen (2Ls) Nurse Communication: Mobility status  Activity Tolerance: Patient tolerated treatment well Patient left: in bed;with call bell/phone within reach;with bed alarm set  OT Visit Diagnosis: Unsteadiness on feet (R26.81);Muscle weakness (generalized) (M62.81)                Time: 4098-1191 OT Time Calculation (min): 24 min Charges:  OT General Charges $OT Visit: 1 Visit OT Evaluation $OT Eval Low Complexity: 1 Low OT Treatments $Self Care/Home Management : 8-22 mins  Jackquline Denmark, MS, OTR/L , CBIS ascom [phone]  12/08/23, 4:15 PM

## 2023-12-08 NOTE — Progress Notes (Signed)
 [facility]  - [place] at [facility]   PATIENT NAME: Heather Choi    MR#:  [id number]  DATE OF BIRTH:  [birth year]  SUBJECTIVE:      VITALS:  Blood pressure 110/60, pulse 87, temperature 98.9 F (37.2 C), resp. rate 18, height 5\' 5"  (1.651 m), weight 75.3 kg, SpO2 93%.  PHYSICAL EXAMINATION:   GENERAL:  63 y.o.-year-old patient with no acute distress.  LUNGS: Normal breath sounds bilaterally, no wheezing CARDIOVASCULAR: S1, S2 normal. No murmur   ABDOMEN: Soft, nontender, nondistended. Bowel sounds present.  EXTREMITIES: No  edema b/l.    NEUROLOGIC: nonfocal  patient is alert and awake SKIN: No obvious rash, lesion, or ulcer.   LABORATORY PANEL:  CBC Recent Labs  Lab 12/07/23 1331  WBC 3.4*  HGB 15.3*  HCT 47.7*  PLT 85*    Chemistries  Recent Labs  Lab 12/07/23 1331 12/08/23 0541  NA 139 138  K 3.7 3.8  CL 102 103  CO2 28 29  GLUCOSE 126* 114*  BUN 18 16  CREATININE 1.03* 1.05*  CALCIUM 8.1* 8.0*  AST 61*  --   ALT 41  --   ALKPHOS 58  --   BILITOT 0.4  --    Cardiac Enzymes No results for input(s): "TROPONINI" in the last 168 hours. RADIOLOGY:  DG Chest 1 View Result Date: 12/08/2023 CLINICAL DATA:  CHF. EXAM: CHEST  1 VIEW COMPARISON:  12/07/2023 FINDINGS: The cardio pericardial silhouette is enlarged. There is pulmonary vascular congestion without overt pulmonary edema. Patchy opacity at the lung bases is probably atelectatic although infiltrate not excluded. No substantial pleural effusion. Dual lead pacer/AICD noted. Telemetry leads overlie the chest. IMPRESSION: No substantial change. Cardiomegaly with vascular congestion and basilar atelectasis or infiltrate. Electronically Signed   By: Kennith Center M.D.   On: 12/08/2023 07:33   DG Chest Port 1 View Result Date: 12/07/2023 CLINICAL DATA:  Shortness of breath.  Cough.  Hypoxia. EXAM: PORTABLE CHEST 1 VIEW COMPARISON:  07/19/2022 FINDINGS: Stable moderate cardiomegaly and diffuse  pulmonary vascular congestion. AICD remains in appropriate position. No evidence of acute infiltrate or pleural effusion. IMPRESSION: Stable moderate cardiomegaly and diffuse pulmonary vascular congestion. No acute findings. Electronically Signed   By: Danae Orleans M.D.   On: 12/07/2023 16:44   CT Head Wo Contrast Result Date: 12/07/2023 CLINICAL DATA:  Headache.  New onset.  Change in vision. EXAM: CT HEAD WITHOUT CONTRAST TECHNIQUE: Contiguous axial images were obtained from the base of the skull through the vertex without intravenous contrast. RADIATION DOSE REDUCTION: This exam was performed according to the departmental dose-optimization program which includes automated exposure control, adjustment of the mA and/or kV according to patient size and/or use of iterative reconstruction technique. COMPARISON:  CT head without contrast 07/20/2022 FINDINGS: Brain: No acute infarct, hemorrhage, or mass lesion is present. No significant white matter lesions are present. The ventricles are of normal size. No significant extraaxial fluid collection is present. Deep brain nuclei are within normal limits. The cerebellar tonsils extend 8 mm below the foramen magnum in the midline and are somewhat pointed. The foramen magnum is crowded. Midline structures are otherwise within normal limits. Brainstem and cerebellum are otherwise normal. Vascular: No hyperdense vessel or unexpected calcification. Skull: Calvarium is intact. No focal lytic or blastic lesions are present. No significant extracranial soft tissue lesion is present. Sinuses/Orbits: The paranasal sinuses and mastoid air cells are clear. Right lens replacement is noted. Globes and orbits are otherwise within normal  limits. IMPRESSION: 1. No acute intracranial abnormality or significant interval change. 2. 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 The foramen magnum is crowded. This could be a source of  the patient's headaches. Electronically Signed   By: Marin Roberts M.D.   On: 12/07/2023 16:34    Assessment and Plan  JAYLEY HUSTEAD is a 63 y.o. female with medical history significant of asthma/COPD, chronic HFrEF with LVEF less than 20%, HTN, IDDM, presented 2 days of cough wheezing fever shortness of breath.   Symptoms started yesterday, patient started develop sore throat then productive cough with small amount of clear phlegm, also has had subjective fever last night.  Overnight patient developed more shortness of breath and wheezing. 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 Juliann Pares as outpatient -- will continue daily dose of Lasix IV for now -- consider cardiology consultation if needed -- continue cardiac meds  Acute on chronic COPD exacerbation in the setting of influenza -- PRN nebulizer -- PO steroid -- instead spirometer and flutter valve  Leukopenia/thrombocytopenia suspect due to viral illness  Type II diabetes. Sugars fairly well controlled -- continue metformin/SSI       Procedures: Family communication : none Consults : none CODE STATUS: full DVT Prophylaxis : Lovenox Level of care: Telemetry Cardiac Status is: Inpatient Remains inpatient appropriate because: congestive heart failure, influenza    TOTAL TIME TAKING CARE OF THIS PATIENT: 50 minutes.  >50% time spent on counselling and coordination of care  Note: This dictation was prepared with Dragon dictation along with  smaller phrase technology. Any transcriptional errors that result from this process are unintentional.  Enedina Finner M.D    [facility]   CC: Primary care physician; Kandyce Rud, MD

## 2023-12-08 NOTE — Plan of Care (Signed)

## 2023-12-09 DIAGNOSIS — J9601 Acute respiratory failure with hypoxia: Secondary | ICD-10-CM | POA: Diagnosis not present

## 2023-12-09 DIAGNOSIS — J101 Influenza due to other identified influenza virus with other respiratory manifestations: Secondary | ICD-10-CM | POA: Diagnosis not present

## 2023-12-09 DIAGNOSIS — I5023 Acute on chronic systolic (congestive) heart failure: Secondary | ICD-10-CM | POA: Diagnosis not present

## 2023-12-09 DIAGNOSIS — J441 Chronic obstructive pulmonary disease with (acute) exacerbation: Secondary | ICD-10-CM | POA: Diagnosis not present

## 2023-12-09 LAB — GLUCOSE, CAPILLARY
Glucose-Capillary: 111 mg/dL — ABNORMAL HIGH (ref 70–99)
Glucose-Capillary: 158 mg/dL — ABNORMAL HIGH (ref 70–99)
Glucose-Capillary: 173 mg/dL — ABNORMAL HIGH (ref 70–99)
Glucose-Capillary: 181 mg/dL — ABNORMAL HIGH (ref 70–99)

## 2023-12-09 MED ORDER — FLUTICASONE PROPIONATE 50 MCG/ACT NA SUSP: 1.0000 | Freq: Every day | NASAL | Status: DC

## 2023-12-09 MED ORDER — ADULT MULTIVITAMIN W/MINERALS CH
1.0000 | ORAL_TABLET | Freq: Every day | ORAL | Status: DC
  Administered 2023-12-09 – 2023-12-11 (×3): 1 via ORAL
  Filled 2023-12-09 (×3): qty 1

## 2023-12-09 MED ORDER — SALINE SPRAY 0.65 % NA SOLN
1.0000 | NASAL | Status: DC | PRN
  Filled 2023-12-09: qty 44

## 2023-12-09 MED ORDER — FLUTICASONE PROPIONATE 50 MCG/ACT NA SUSP
1.0000 | Freq: Every day | NASAL | Status: DC
  Administered 2023-12-10 – 2023-12-11 (×2): 1 via NASAL
  Filled 2023-12-09 (×2): qty 16

## 2023-12-09 MED ORDER — OSELTAMIVIR PHOSPHATE 30 MG PO CAPS
30.0000 mg | ORAL_CAPSULE | Freq: Two times a day (BID) | ORAL | Status: DC
  Administered 2023-12-09 – 2023-12-11 (×5): 30 mg via ORAL
  Filled 2023-12-09 (×5): qty 1

## 2023-12-09 MED ORDER — FUROSEMIDE 20 MG PO TABS
20.0000 mg | ORAL_TABLET | Freq: Every day | ORAL | Status: DC
  Administered 2023-12-09 – 2023-12-10 (×2): 20 mg via ORAL
  Filled 2023-12-09 (×2): qty 1

## 2023-12-09 MED ORDER — FUROSEMIDE 10 MG/ML IJ SOLN
20.0000 mg | Freq: Once | INTRAMUSCULAR | Status: AC
  Administered 2023-12-09: 20 mg via INTRAVENOUS
  Filled 2023-12-09: qty 2

## 2023-12-09 MED ORDER — IPRATROPIUM-ALBUTEROL 0.5-2.5 (3) MG/3ML IN SOLN
3.0000 mL | Freq: Three times a day (TID) | RESPIRATORY_TRACT | Status: DC
  Administered 2023-12-09 – 2023-12-10 (×3): 3 mL via RESPIRATORY_TRACT
  Filled 2023-12-09 (×3): qty 3

## 2023-12-09 NOTE — Progress Notes (Signed)
 [facility]  - [place] at [facility]   PATIENT NAME: Heather Choi    MR#:  [id number]  DATE OF BIRTH:  1961-02-08  SUBJECTIVE:  no family at bedside. Overall feels low bit better. Still short winded. Does not use oxygen at home. Unable to wean down. May need oxygen to go home with.NO UOP documented  VITALS:  Blood pressure 107/79, pulse 80, temperature (!) 97.5 F (36.4 C), resp. rate 20, height 5\' 5"  (1.651 m), weight 70 kg, SpO2 91%.  PHYSICAL EXAMINATION:   GENERAL:  63 y.o.-year-old patient with no acute distress. weak LUNGS: decreased  breath sounds bilaterally, no wheezing CARDIOVASCULAR: S1, S2 normal. No murmur   ABDOMEN: Soft, nontender, nondistended.  EXTREMITIES: No  edema b/l.    NEUROLOGIC: nonfocal  patient is alert and awake SKIN: No obvious rash, lesion, or ulcer.   LABORATORY PANEL:  CBC Recent Labs  Lab 12/07/23 1331  WBC 3.4*  HGB 15.3*  HCT 47.7*  PLT 85*    Chemistries  Recent Labs  Lab 12/07/23 1331 12/08/23 0541  NA 139 138  K 3.7 3.8  CL 102 103  CO2 28 29  GLUCOSE 126* 114*  BUN 18 16  CREATININE 1.03* 1.05*  CALCIUM 8.1* 8.0*  AST 61*  --   ALT 41  --   ALKPHOS 58  --   BILITOT 0.4  --    Cardiac Enzymes No results for input(s): "TROPONINI" in the last 168 hours. RADIOLOGY:  DG Chest 1 View Result Date: 12/08/2023 CLINICAL DATA:  CHF. EXAM: CHEST  1 VIEW COMPARISON:  12/07/2023 FINDINGS: The cardio pericardial silhouette is enlarged. There is pulmonary vascular congestion without overt pulmonary edema. Patchy opacity at the lung bases is probably atelectatic although infiltrate not excluded. No substantial pleural effusion. Dual lead pacer/AICD noted. Telemetry leads overlie the chest. IMPRESSION: No substantial change. Cardiomegaly with vascular congestion and basilar atelectasis or infiltrate. Electronically Signed   By: Kennith Center M.D.   On: 12/08/2023 07:33   CT Head Wo Contrast Result Date: 12/07/2023 CLINICAL  DATA:  Headache.  New onset.  Change in vision. EXAM: CT HEAD WITHOUT CONTRAST TECHNIQUE: Contiguous axial images were obtained from the base of the skull through the vertex without intravenous contrast. RADIATION DOSE REDUCTION: This exam was performed according to the departmental dose-optimization program which includes automated exposure control, adjustment of the mA and/or kV according to patient size and/or use of iterative reconstruction technique. COMPARISON:  CT head without contrast 07/20/2022 FINDINGS: Brain: No acute infarct, hemorrhage, or mass lesion is present. No significant white matter lesions are present. The ventricles are of normal size. No significant extraaxial fluid collection is present. Deep brain nuclei are within normal limits. The cerebellar tonsils extend 8 mm below the foramen magnum in the midline and are somewhat pointed. The foramen magnum is crowded. Midline structures are otherwise within normal limits. Brainstem and cerebellum are otherwise normal. Vascular: No hyperdense vessel or unexpected calcification. Skull: Calvarium is intact. No focal lytic or blastic lesions are present. No significant extracranial soft tissue lesion is present. Sinuses/Orbits: The paranasal sinuses and mastoid air cells are clear. Right lens replacement is noted. Globes and orbits are otherwise within normal limits. IMPRESSION: 1. No acute intracranial abnormality or significant interval change. 2. 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 The foramen magnum is crowded. This could be a source of the patient's headaches. Electronically Signed   By: Cristal Deer  Mattern M.D.   On: 12/07/2023 16:34    Assessment and Plan  Heather Choi is a 63 y.o. female with medical history significant of asthma/COPD, chronic HFrEF with LVEF less than 20%, HTN, IDDM, presented 2 days of cough wheezing fever shortness of breath.   Symptoms started  yesterday, patient started develop sore throat then productive cough with small amount of clear phlegm, also has had subjective fever last night.  Overnight patient developed more shortness of breath and wheezing. 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 Juliann Pares as outpatient -- will continue daily dose of Lasix IV as BP tolerates -- consider cardiology consultation if needed -- continue cardiac meds  Acute on chronic COPD exacerbation in the setting of influenza -- PRN nebulizer -- PO steroid -- instead spirometer and flutter valve  Leukopenia/thrombocytopenia suspect due to viral illness  Type II diabetes. Sugars fairly well controlled -- continue metformin/SSI       Procedures: Family communication : none Consults : none CODE STATUS: full DVT Prophylaxis : Lovenox Level of care: Telemetry Cardiac Status is: Inpatient Remains inpatient appropriate because: congestive heart failure, influenza    TOTAL TIME TAKING CARE OF THIS PATIENT: 50 minutes.  >50% time spent on counselling and coordination of care  Note: This dictation was prepared with Dragon dictation along with smaller phrase technology. Any transcriptional errors that result from this process are unintentional.  Enedina Finner M.D    [facility]   CC: Primary care physician; Kandyce Rud, MD

## 2023-12-09 NOTE — Plan of Care (Signed)

## 2023-12-09 NOTE — Progress Notes (Signed)
 PHARMACY NOTE:  ANTIMICROBIAL RENAL DOSAGE ADJUSTMENT  Current antimicrobial regimen includes a mismatch between antimicrobial dosage and estimated renal function.  As per policy approved by the Pharmacy & Therapeutics and Medical Executive Committees, the antimicrobial dosage will be adjusted accordingly.  Current antimicrobial dosage:  Oseltamivir 75mg  BID  Indication: Influenza A  Renal Function:  Estimated Creatinine Clearance: 55.2 mL/min (A) (by C-G formula based on SCr of 1.05 mg/dL (H)).     Antimicrobial dosage has been changed to:  Oseltamivir 30mg  BID  Additional comments:   Thank you for allowing pharmacy to be a part of this patient's care.  Gardner Candle, PharmD, BCPS Clinical Pharmacist 12/09/2023 7:10 AM

## 2023-12-09 NOTE — Plan of Care (Signed)

## 2023-12-09 NOTE — Progress Notes (Signed)
 Attempted to wean patient off of oxygen, unsuccessful. Patient on oxygen at 2 l via [place] with spo2 of 96%, o2 removed and oxygen dropped to 86% on room air. 1 liter via [place] reapplied with spo2 at 89%, oxygen increased back to 2 liters via nasal cannula with spo2 at 93%. Will continue to monitor.

## 2023-12-09 NOTE — Progress Notes (Signed)
 Initial Nutrition Assessment  DOCUMENTATION CODES:   Not applicable  INTERVENTION:  Multivitamin w/ minerals daily Liberalize pt diet to Carb Modified to increase adequate PO intake. Encourage good PO intake  NUTRITION DIAGNOSIS:  Increased nutrient needs related to acute illness, chronic illness as evidenced by estimated needs.  GOAL:  Patient will meet greater than or equal to 90% of their needs  MONITOR:  PO intake, I & O's, Labs  REASON FOR ASSESSMENT:  Consult Assessment of nutrition requirement/status  ASSESSMENT:  63 y.o. female presented to the ED with shortness of breath, fever, and a cough. PMH includes COPD, HTN, CHF, T2DM, Graves disease, CKD3, PAD, and hypothyroidism. Pt admitted with sepsis 2/2 Influenza, CHF exacerbation, and COPD exacerbation.   RD working remotely at time of assessment, RD unable to reach pt via phone in room. Pt was last evaluated by a [place] RD in 2023.   Pt currently on a heart healthy, carb modified diet. No meal intakes have been recorded. RD to liberalize pt diet to promote good PO intake.   Per EMR, pt with no weight history over the past year to assess for weight loss.   Medications reviewed and include: Novolog SSI, Potassium Chloride, Prednisone, Spironolactone Labs reviewed. CBG: 108-180 x 24 hrs  NUTRITION - FOCUSED PHYSICAL EXAM: Deferred to follow-up.   Diet Order:   Diet Order             Diet Carb Modified Fluid consistency: Thin; Room service appropriate? Yes  Diet effective now                  EDUCATION NEEDS: No education needs have been identified at this time  Skin:  Skin Assessment: Reviewed RN Assessment  Last BM:  12/08/23  Height:  Ht Readings from Last 1 Encounters:  12/07/23 5\' 5"  (1.651 m)   Weight:  Wt Readings from Last 1 Encounters:  12/09/23 70 kg   Ideal Body Weight:  56.8 kg  BMI:  Body mass index is 25.68 kg/m.  Estimated Nutritional Needs:  Kcal:  1800-2000 Protein:   90-110 grams Fluid:  >/= 1.8 L   Kirby Crigler RD, LDN Clinical Dietitian See [facility] for contact information.

## 2023-12-09 NOTE — Evaluation (Signed)
 Physical Therapy Evaluation Patient Details Name: Heather Choi MRN: [id number] DOB: 06/17/1961 Today's Date: 12/09/2023  History of Present Illness  63 y.o. female with medical history significant of asthma/COPD, chronic HFrEF with LVEF less than 20%, HTN, IDDM, presented 2 days of cough wheezing fever shortness of breath.  Clinical Impression  The pt is presenting with some generalized weakness and deconditioning. The pt was educated on the role of PT and the need for initiating skilled PT at this stage in order to prevent further deconditioning. Following education the patient was willing to participate in PT. The pt is expected to continue to progress with skilled PT address functional deficits noted in the session. PT will continue to follow.         If plan is discharge home, recommend the following: A little help with walking and/or transfers;A little help with bathing/dressing/bathroom;Help with stairs or ramp for entrance   Can travel by private vehicle        Equipment Recommendations    Recommendations for Other Services       Functional Status Assessment Patient has had a recent decline in their functional status and demonstrates the ability to make significant improvements in function in a reasonable and predictable amount of time.     Precautions / Restrictions Precautions Precautions: Fall      Mobility  Bed Mobility Overal bed mobility: Needs Assistance Bed Mobility: Supine to Sit     Supine to sit: HOB elevated, Used rails, Modified independent (Device/Increase time)          Transfers Overall transfer level: Needs assistance Equipment used: Rolling walker (2 wheels) Transfers: Sit to/from Stand Sit to Stand: Contact guard assist                Ambulation/Gait Ambulation/Gait assistance: Supervision Gait Distance (Feet): 40 Feet Assistive device: Rolling walker (2 wheels)            Stairs            Wheelchair Mobility      Tilt Bed    Modified Rankin (Stroke Patients Only)       Balance Overall balance assessment: Needs assistance Sitting-balance support: Feet supported, No upper extremity supported Sitting balance-Leahy Scale: Good     Standing balance support: During functional activity Standing balance-Leahy Scale: Fair                               Pertinent Vitals/Pain Pain Assessment Pain Assessment: 0-10 Pain Score: 7  Pain Location: stomach-pre existing chronic pain Pain Descriptors / Indicators: Pressure Pain Intervention(s): Monitored during session    Home Living Family/patient expects to be discharged to:: Private residence Living Arrangements: Other relatives Available Help at Discharge: Family;Available 24 hours/day Type of Home: Apartment Home Access: Stairs to enter Entrance Stairs-Rails: Can reach both Entrance Stairs-Number of Steps: 4   Home Layout: One level Home Equipment: None      Prior Function Prior Level of Function : Independent/Modified Independent;Driving;Working/employed;History of Falls (last six months)                     Extremity/Trunk Assessment   Upper Extremity Assessment Upper Extremity Assessment: LUE deficits/detail;RUE deficits/detail RUE Deficits / Details: Shoulder flexion: 3+/5 LUE Deficits / Details: Shoulder flexion: 3+/5    Lower Extremity Assessment Lower Extremity Assessment: RLE deficits/detail;LLE deficits/detail RLE Deficits / Details: Knee flexors: 4/5, Hip flexor: 3/5 LLE Deficits / Details: Knee  flexors: 4/5,Hip flexor: 3/5       Communication   Communication Communication: No apparent difficulties    Cognition Arousal: Alert Behavior During Therapy: WFL for tasks assessed/performed                             Following commands: Intact       Cueing       General Comments      Exercises     Assessment/Plan    PT Assessment Patient needs continued PT services  PT  Problem List Decreased strength;Decreased mobility;Decreased balance;Decreased activity tolerance       PT Treatment Interventions Gait training;Stair training;Therapeutic exercise;Balance training    PT Goals (Current goals can be found in the Care Plan section)  Acute Rehab PT Goals Patient Stated Goal: Return home and return to work PT Goal Formulation: With patient Time For Goal Achievement: 12/19/23 Potential to Achieve Goals: Good    Frequency Min 3X/week     Co-evaluation               AM-PAC PT "6 Clicks" Mobility  Outcome Measure Help needed turning from your back to your side while in a flat bed without using bedrails?: A Little Help needed moving from lying on your back to sitting on the side of a flat bed without using bedrails?: A Little Help needed moving to and from a bed to a chair (including a wheelchair)?: A Little Help needed standing up from a chair using your arms (e.g., wheelchair or bedside chair)?: A Little Help needed to walk in hospital room?: A Little Help needed climbing 3-5 steps with a railing? : A Lot 6 Click Score: 17    End of Session Equipment Utilized During Treatment: Oxygen Activity Tolerance: Patient tolerated treatment well Patient left: in bed;with nursing/sitter in room Nurse Communication: Mobility status PT Visit Diagnosis: Unsteadiness on feet (R26.81);Muscle weakness (generalized) (M62.81)    Time: 1610-9604 PT Time Calculation (min) (ACUTE ONLY): 28 min   Charges:   PT Evaluation $PT Eval Low Complexity: 1 Low PT Treatments $Gait Training: 8-22 mins PT General Charges $$ ACUTE PT VISIT: 1 Visit         1:09 PM, 12/09/23 Callan Norden A. Mordecai Maes PT, DPT Physical Therapist - [facility]   Pervis Macintyre A Breezy Hertenstein 12/09/2023, 1:06 PM

## 2023-12-10 ENCOUNTER — Inpatient Hospital Stay

## 2023-12-10 ENCOUNTER — Other Ambulatory Visit (HOSPITAL_COMMUNITY): Payer: Self-pay

## 2023-12-10 DIAGNOSIS — J441 Chronic obstructive pulmonary disease with (acute) exacerbation: Secondary | ICD-10-CM | POA: Diagnosis not present

## 2023-12-10 DIAGNOSIS — J101 Influenza due to other identified influenza virus with other respiratory manifestations: Secondary | ICD-10-CM | POA: Diagnosis not present

## 2023-12-10 DIAGNOSIS — I5023 Acute on chronic systolic (congestive) heart failure: Secondary | ICD-10-CM | POA: Diagnosis not present

## 2023-12-10 DIAGNOSIS — J9601 Acute respiratory failure with hypoxia: Secondary | ICD-10-CM | POA: Diagnosis not present

## 2023-12-10 LAB — GLUCOSE, CAPILLARY
Glucose-Capillary: 88 mg/dL (ref 70–99)
Glucose-Capillary: 145 mg/dL — ABNORMAL HIGH (ref 70–99)
Glucose-Capillary: 244 mg/dL — ABNORMAL HIGH (ref 70–99)
Glucose-Capillary: 156 mg/dL — ABNORMAL HIGH (ref 70–99)

## 2023-12-10 LAB — BASIC METABOLIC PANEL
Anion gap: 7 (ref 5–15)
BUN: 20 mg/dL (ref 8–23)
CO2: 31 mmol/L (ref 22–32)
Calcium: 8.6 mg/dL — ABNORMAL LOW (ref 8.9–10.3)
Chloride: 98 mmol/L (ref 98–111)
Creatinine, Ser: 0.88 mg/dL (ref 0.44–1.00)
GFR, Estimated: >60 mL/min (ref >60)
Glucose, Bld: 156 mg/dL — ABNORMAL HIGH (ref 70–99)
Potassium: 4.3 mmol/L (ref 3.5–5.1)
Sodium: 136 mmol/L (ref 135–145)

## 2023-12-10 LAB — HEMOGLOBIN A1C
Hgb A1c MFr Bld: 4.8 % (ref 4.8–5.6)
Mean Plasma Glucose: 91 mg/dL

## 2023-12-10 MED ORDER — TORSEMIDE 20 MG PO TABS
20.0000 mg | ORAL_TABLET | Freq: Every day | ORAL | Status: DC
  Administered 2023-12-11: 20 mg via ORAL
  Filled 2023-12-10: qty 1

## 2023-12-10 MED ORDER — IPRATROPIUM-ALBUTEROL 0.5-2.5 (3) MG/3ML IN SOLN
3.0000 mL | Freq: Two times a day (BID) | RESPIRATORY_TRACT | Status: DC
  Administered 2023-12-10 – 2023-12-11 (×2): 3 mL via RESPIRATORY_TRACT
  Filled 2023-12-10 (×2): qty 3

## 2023-12-10 MED ORDER — AZITHROMYCIN 250 MG PO TABS
500.0000 mg | ORAL_TABLET | Freq: Every day | ORAL | Status: DC
  Administered 2023-12-10 – 2023-12-11 (×2): 500 mg via ORAL
  Filled 2023-12-10 (×2): qty 2

## 2023-12-10 MED ORDER — SACUBITRIL-VALSARTAN 49-51 MG PO TABS
1.0000 | ORAL_TABLET | Freq: Two times a day (BID) | ORAL | Status: DC
  Administered 2023-12-10 – 2023-12-11 (×2): 1 via ORAL
  Filled 2023-12-10 (×2): qty 1

## 2023-12-10 NOTE — Progress Notes (Addendum)
 Heart Failure Navigator Progress Note  Assessed for Heart & Vascular TOC clinic readiness.  Will schedule a Established Patient follow-up appointment with the [facility] with [facility].  Navigator will sign off at this time.  Roxy Horseman, RN, BSN [facility] Heart Failure Navigator Secure Chat Only

## 2023-12-10 NOTE — Plan of Care (Signed)

## 2023-12-10 NOTE — Progress Notes (Signed)
 Heart Failure Stewardship Pharmacy Note  PCP: Kandyce Rud, MD PCP-Cardiologist: None  HPI: Heather Choi is a 63 y.o. female with asthma/COPD, chronic HFrEF with LVEF less than 20%, HTN, IDDM who presented with shortness of breath, cough, wheezing, and fever. Tested positive for influenza A. On admission, BNP was 1144.2, HS troponin was 87, and lactic acid was 0.8. Chest x-ray 12/08/23 noted vascular congestion and basilar atelectasis or infiltrate.    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 Normal coronary anatomy noted in 2015. Echocardiogram in 11/2016 noted LVEF of 20-25%. LVEF improved to 35-30% in 10/2017 and was unchanged in 01/2019. Most recent echocardiogram in 07/2022 showed LVEF reduced again to <20% with grade III diastolic dysfunction, severely reduced RV function, severely dilated atria, and mild-moderate AR.   Pertinent Lab Values: Creatinine  Date Value Ref Range Status  09/01/2014 0.93 0.60 - 1.30 mg/dL Final   Creatinine, Ser  Date Value Ref Range Status  12/08/2023 1.05 (H) 0.44 - 1.00 mg/dL Final   BUN  Date Value Ref Range Status  12/08/2023 16 8 - 23 mg/dL Final  16/07/9603 14 7 - 18 mg/dL Final   Potassium  Date Value Ref Range Status  12/08/2023 3.8 3.5 - 5.1 mmol/L Final  09/01/2014 4.0 3.5 - 5.1 mmol/L Final   Sodium  Date Value Ref Range Status  12/08/2023 138 135 - 145 mmol/L Final  09/01/2014 143 136 - 145 mmol/L Final   B Natriuretic Peptide  Date Value Ref Range Status  12/07/2023 1,144.2 (H) 0.0 - 100.0 pg/mL Final    Comment:    Performed at [facility], [address] [place], [place] [address]   Magnesium  Date Value Ref Range Status  07/20/2022 1.9 1.7 - 2.4 mg/dL Final    Comment:    Performed at [facility], [address] [place], [place] [address]   Hgb A1c MFr Bld  Date Value Ref Range Status  09/20/2020 5.4 4.8 - 5.6 %  Final    Comment:    (NOTE)         Prediabetes: 5.7 - 6.4         Diabetes: >6.4         Glycemic control for adults with diabetes: <7.0    Digoxin Level  Date Value Ref Range Status  05/17/2020 0.4 (L) 0.8 - 2.0 ng/mL Final    Comment:    Performed at [facility], [address] [place], [place] [address]   TSH  Date Value Ref Range Status  06/07/2019 2.309 0.350 - 4.500 uIU/mL Final    Comment:    Performed by a 3rd Generation assay with a functional sensitivity of <=0.01 uIU/mL. Performed at [facility], [address] [place], [place] [address]     Vital Signs: Admission weight: 165.9 lbs Temp:  [97.5 F (36.4 C)-98.6 F (37 C)] 98.6 F (37 C) (03/10 0424) Pulse Rate:  [70-80] 80 (03/10 0424) Cardiac Rhythm: Normal sinus rhythm (03/09 1905) Resp:  [16-27] 27 (03/10 0424) BP: (107-136)/(69-79) 126/70 (03/10 0424) SpO2:  [91 %-97 %] 95 % (03/10 0424) Weight:  [68 kg (149 lb 14.6 oz)] 68 kg (149 lb 14.6 oz) (03/10 0600)  Intake/Output Summary (Last 24 hours) at 12/10/2023 0737 Last data filed at 12/10/2023 0600 Gross per 24 hour  Intake 780 ml  Output 1200 ml  Net -420 ml   Current Heart Failure Medications:  Loop  diuretic: torsemide 20 mg daily Beta-Blocker: carvedilol 12.5 mg BID ACEI/ARB/ARNI: Entresto 24-26 mg BID MRA: spironolactone 12.5 mg daily SGLT2i: none  Prior to admission Heart Failure Medications:  Loop diuretic: torsemide 20 mg at bedtime Beta-Blocker: carvedilol 12.5 mg BID ACEI/ARB/ARNI: Entresto 24-26 mg BID MRA: spironolactone 12.5 mg daily SGLT2i: none  Assessment: 1. 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 NYHA class II-III symptoms.  -Symptoms: Reports feeling improved since admission. Still with orthopnea that is present at baseline. Reports fatigue. Appetite is good. Denies LEE. -Volume: Appears to be  nearly euvolemic after minimal IV Lasix. Symptoms likely predominately due to influenza. Weight down 16 lbs if accurate, though doubt this.Creatinine stable. Continue torsemide 20 mg daily. -Hemodynamics: BP is normal to high. HR 70-80s.  -BB: Continue carvedilol 12.5 mg BID.  -ACEI/ARB/ARNI: Currently on Entresto 24-26 mg BID. Can consider increasing dose to 49/51 mg BID. -MRA: Continue spironolactone 12.5 mg daily. Can consider increasing to target dose if creatinine, K, and BP are stable after increasing Entresto. -SGLT2i:Would consider starting [place] or [place]ard[place] right before discharge when stable from acute influenza virus.  Plan: 1) Medication changes recommended at this time: -Consider increasing Entresto to 49/51 mg BID -Consider adding Farxiga 10 mg daily when recovered from acute influenza.  2) Patient assistance: -Unable to assess medication copays due to [facility] holding exclusive privileges for medication fills. -Is eligible for copay cards.  3) Education: - Patient has been educated on current HF medications and potential additions to HF medication regimen - Patient verbalizes understanding that over the next few months, these medication doses may change and more medications may be added to optimize HF regimen - Patient has been educated on basic disease state pathophysiology and goals of therapy  Medication Assistance / Insurance Benefits Check: Does the patient have prescription insurance?    Type of insurance plan:  Does the patient qualify for medication assistance through manufacturers or grants? Pending  Outpatient Pharmacy: Prior to admission outpatient pharmacy: [facility]      Please do not hesitate to reach out with questions or concerns,  Enos Fling, PharmD, CPP, BCPS Heart Failure Pharmacist  Phone - [phone] 12/10/2023 12:11 PM

## 2023-12-10 NOTE — Progress Notes (Signed)
 [facility]  - [place] at [facility]   PATIENT NAME: Heather Choi    MR#:  [id number]  DATE OF BIRTH:  10/03/1960  SUBJECTIVE:  no family at bedside. Overall feels  better. Still short winded. Does not use oxygen at home. Unable to wean down. will need oxygen to go home with. Good UOP yday  VITALS:  Blood pressure 111/74, pulse 73, temperature 97.6 F (36.4 C), temperature source Oral, resp. rate 18, height 5\' 5"  (1.651 m), weight 68 kg, SpO2 96%.  PHYSICAL EXAMINATION:   GENERAL:  63 y.o.-year-old patient with no acute distress. weak LUNGS: decreased  breath sounds bilaterally CARDIOVASCULAR: S1, S2 normal. No murmur   ABDOMEN: Soft, nontender, nondistended.  EXTREMITIES: No  edema b/l.    NEUROLOGIC: nonfocal  patient is alert and awake  LABORATORY PANEL:  CBC Recent Labs  Lab 12/07/23 1331  WBC 3.4*  HGB 15.3*  HCT 47.7*  PLT 85*    Chemistries  Recent Labs  Lab 12/07/23 1331 12/08/23 0541 12/10/23 1006  NA 139   < > 136  K 3.7   < > 4.3  CL 102   < > 98  CO2 28   < > 31  GLUCOSE 126*   < > 156*  BUN 18   < > 20  CREATININE 1.03*   < > 0.88  CALCIUM 8.1*   < > 8.6*  AST 61*  --   --   ALT 41  --   --   ALKPHOS 58  --   --   BILITOT 0.4  --   --    < > = values in this interval not displayed.   Cardiac Enzymes No results for input(s): "TROPONINI" in the last 168 hours. RADIOLOGY:  DG Chest Port 1 View Result Date: 12/10/2023 CLINICAL DATA:  Shortness of breath EXAM: PORTABLE CHEST 1 VIEW COMPARISON:  12/08/2023 FINDINGS: Cardiac shadow is enlarged but stable. Defibrillator is again noted. Lungs are well aerated bilaterally. Mild persistent vascular congestion is noted. No focal confluent infiltrate is seen. Stable basilar atelectasis is noted. IMPRESSION: No significant change from the prior exam. Electronically Signed   By: Alcide Clever M.D.   On: 12/10/2023 10:47    Assessment and Plan  Heather Choi is a 63 y.o. female with medical  history significant of asthma/COPD, chronic HFrEF with LVEF less than 20%, HTN, IDDM, presented 2 days of cough wheezing fever shortness of breath.   Symptoms started yesterday, patient started develop sore throat then productive cough with small amount of clear phlegm, also has had subjective fever last night.  Overnight patient developed more shortness of breath and wheezing. Chest x-ray showed bilateral pulmonary congestion  BNP was elevated patient received Tamiflu and Lasix in the ED  sepsis suspected due to influenza --symptomatic care with PRN Tylenol, cough drops -- continue Tamiflu -- afebrile, blood culture negative -- Pro calcitonin less than 0.1 --will give short course of po abxs --repeat CXR same  Acute on chronic systolic congestive heart failure severe cardiomyopathy/history of postpartum cardiomyopathy/dual chamber ICD -- known EF of less than 20% per echo 2023 -- patient follows with Dr. Juliann Pares as outpatient -- will continue daily dose of Lasix IV as BP tolerates -- consider cardiology consultation if needed -- continue cardiac meds--increased entresto dose. Consider adding farxiga--defer to cardiology --changed to po torsemide --creat stable0.88  Acute on chronic COPD exacerbation in the setting of influenza ongoing tobacco abuse -- PRN nebulizer -- PO  steroid -- incentive spirometer and flutter valve --will need oxygen for home -- advised smoking cessation  Leukopenia/thrombocytopenia suspect due to viral illness  Type II diabetes. Sugars fairly well controlled -- continue metformin/SSI       Procedures: Family communication : none Consults : none CODE STATUS: full DVT Prophylaxis : Lovenox Level of care: Telemetry Cardiac Status is: Inpatient Remains inpatient appropriate because: congestive heart failure, influenza monitor for one more day.    TOTAL TIME TAKING CARE OF THIS PATIENT: 40 minutes.  >50% time spent on counselling and  coordination of care  Note: This dictation was prepared with Dragon dictation along with smaller phrase technology. Any transcriptional errors that result from this process are unintentional.  Enedina Finner M.D    [facility]   CC: Primary care physician; Kandyce Rud, MD

## 2023-12-10 NOTE — Progress Notes (Signed)
 Occupational Therapy Treatment Patient Details Name: Heather Choi MRN: [id number] DOB: [birth year] Today's Date: 12/10/2023   History of present illness 63 y.o. female with medical history significant of asthma/COPD, chronic HFrEF with LVEF less than 20%, HTN, IDDM, presented 2 days of cough wheezing fever shortness of breath.   OT comments  Pt seen today for ADL session focusing on UB/LB bathing, dressing and toileting. Pt with decreased functional activity tolerance, requiring frequent rest breaks secondary to fatigue and SOB. Overall, pt completes ADLs/transfers/functional mobility at supervision level with intermittent vcs for safety awareness of O2 line. Trial of RA during t/f bathroom, SpO2 dropping to 86% on RA, requires application of 2L to increase up to 93%. Pt making good progress towards goals, OT will continue to follow for functional gains. Discharge recommendation appropriate.       If plan is discharge home, recommend the following:  A little help with walking and/or transfers;A little help with bathing/dressing/bathroom;Assist for transportation   Equipment Recommendations  None recommended by OT       Precautions / Restrictions Precautions Precautions: Fall Restrictions Weight Bearing Restrictions Per Provider Order: No       Mobility Bed Mobility Overal bed mobility: Modified Independent Bed Mobility: Supine to Sit     Supine to sit: HOB elevated, Used rails, Modified independent (Device/Increase time)          Transfers Overall transfer level: Needs assistance Equipment used: None Transfers: Sit to/from Stand Sit to Stand: Supervision                 Balance Overall balance assessment: Needs assistance Sitting-balance support: Feet supported, No upper extremity supported Sitting balance-Leahy Scale: Normal Sitting balance - Comments: no LOB while performing LB bathing seated EOB   Standing balance support: During functional activity Standing  balance-Leahy Scale: Good Standing balance comment: stands on one leg with unilateral support attempting to don socks in standing, no LOB                           ADL either performed or assessed with clinical judgement   ADL Overall ADL's : Needs assistance/impaired     Grooming: Wash/dry face;Wash/dry hands;Sitting;Supervision/safety   Upper Body Bathing: Sitting;Supervision/ safety   Lower Body Bathing: Supervison/ safety;Sit to/from stand Lower Body Bathing Details (indicate cue type and reason): seated figure four to wash feet, stands to wash periarea Upper Body Dressing : Minimal assistance;Sitting Upper Body Dressing Details (indicate cue type and reason): minA to don gown (assist due to telemonitor) Lower Body Dressing: Supervision/safety;Sit to/from stand Lower Body Dressing Details (indicate cue type and reason): dons underwear from STS; attempts to don socks in standing holding on to bed rail (OT provides cues to redirect to perform from seated position) Toilet Transfer: Supervision/safety;Ambulation Toilet Transfer Details (indicate cue type and reason): t/f bathroom on RA, supervision Toileting- Clothing Manipulation and Hygiene: Supervision/safety;Sit to/from stand       Functional mobility during ADLs: Supervision/safety General ADL Comments: Full ADL session supervision level     Communication Communication Communication: No apparent difficulties   Cognition Arousal: Alert Behavior During Therapy: WFL for tasks assessed/performed Cognition: No apparent impairments                               Following commands: Intact        Cueing   Cueing Techniques: Verbal cues  Pertinent Vitals/ Pain       Pain Assessment Pain Assessment: No/denies pain Pain Score: 0-No pain         Frequency  Min 2X/week        Progress Toward Goals  OT Goals(current goals can now be found in the care plan section)  Progress  towards OT goals: Progressing toward goals  Acute Rehab OT Goals OT Goal Formulation: With patient Time For Goal Achievement: 12/22/23 Potential to Achieve Goals: Fair ADL Goals Pt Will Perform Grooming: Independently;standing Pt Will Perform Lower Body Dressing: Independently;sit to/from stand Pt Will Transfer to Toilet: Independently;ambulating Pt Will Perform Toileting - Clothing Manipulation and hygiene: Independently;sit to/from stand  Plan         AM-PAC OT "6 Clicks" Daily Activity     Outcome Measure   Help from another person eating meals?: None Help from another person taking care of personal grooming?: None Help from another person toileting, which includes using toliet, bedpan, or urinal?: A Little Help from another person bathing (including washing, rinsing, drying)?: A Little Help from another person to put on and taking off regular upper body clothing?: None Help from another person to put on and taking off regular lower body clothing?: A Little 6 Click Score: 21    End of Session Equipment Utilized During Treatment: Oxygen  OT Visit Diagnosis: Unsteadiness on feet (R26.81);Muscle weakness (generalized) (M62.81)   Activity Tolerance Patient tolerated treatment well   Patient Left in chair;with call bell/phone within reach;with chair alarm set   Nurse Communication Mobility status        Time: 1610-9604 OT Time Calculation (min): 48 min  Charges: OT General Charges $OT Visit: 1 Visit OT Treatments $Self Care/Home Management : 38-52 mins Willett Lefeber L. Lygia Olaes, OTR/L  12/10/23, 12:41 PM

## 2023-12-11 ENCOUNTER — Other Ambulatory Visit: Payer: Self-pay

## 2023-12-11 DIAGNOSIS — J101 Influenza due to other identified influenza virus with other respiratory manifestations: Secondary | ICD-10-CM | POA: Diagnosis not present

## 2023-12-11 LAB — GLUCOSE, CAPILLARY
Glucose-Capillary: 104 mg/dL — ABNORMAL HIGH (ref 70–99)
Glucose-Capillary: 220 mg/dL — ABNORMAL HIGH (ref 70–99)

## 2023-12-11 MED ORDER — OSELTAMIVIR PHOSPHATE 30 MG PO CAPS
30.0000 mg | ORAL_CAPSULE | Freq: Two times a day (BID) | ORAL | 0 refills | Status: AC
  Filled 2023-12-11: qty 2, 1d supply, fill #0

## 2023-12-11 MED ORDER — WHITE PETROLATUM EX OINT
TOPICAL_OINTMENT | CUTANEOUS | Status: DC | PRN
  Administered 2023-12-11: 1 via TOPICAL
  Filled 2023-12-11 (×2): qty 5

## 2023-12-11 MED ORDER — TORSEMIDE 10 MG PO TABS: 20.0000 mg | ORAL_TABLET | Freq: Every day | ORAL | Status: DC

## 2023-12-11 MED ORDER — PREDNISONE 20 MG PO TABS
40.0000 mg | ORAL_TABLET | Freq: Every day | ORAL | 0 refills | Status: AC
  Filled 2023-12-11: qty 2, 1d supply, fill #0

## 2023-12-11 MED ORDER — BUDESONIDE 0.5 MG/2ML IN SUSP
0.5000 mg | Freq: Two times a day (BID) | RESPIRATORY_TRACT | Status: DC
  Administered 2023-12-11: 0.5 mg via RESPIRATORY_TRACT
  Filled 2023-12-11: qty 2

## 2023-12-11 NOTE — TOC Transition Note (Signed)
 Transition of Care [facility]) - Discharge Note   Patient Details  Name: Heather Choi MRN: [id number] Date of Birth: [birth year]  [facility] CM/SW Contact:  Truddie Hidden, RN Phone Number: 12/11/2023, 10:43 AM   Clinical Narrative:    Spoke with patient regarding discharge today. She has concerns about discharging and is requesting to speak with the MD. She is agreeable to [facility] but does not want to pay a co-payment. She was advised the [facility] agency will inform her of any co payment. Patient was also advised she could cancel at anytime or follow up with her MD if she would like to have HH arranged at a later time. Patient wanted to proceed. She does not have a choice of an agency. She was advised the referral would be sent to [facility] and they will contact her regarding scheduling and payments.  Referral sent and accepted by Shaun.   MD notified of patient request.   TOC signing off.           Patient Goals and CMS Choice            Discharge Placement                       Discharge Plan and Services Additional resources added to the After Visit Summary for                                       Social Drivers of Health (SDOH) Interventions SDOH Screenings   Food Insecurity: No Food Insecurity (12/08/2023)  Housing: Low Risk  (12/08/2023)  Transportation Needs: No Transportation Needs (12/08/2023)  Utilities: Not At Risk (12/08/2023)  Financial Resource Strain: Patient Declined (12/18/2022)   Received from [facility]  Tobacco Use: High Risk (12/07/2023)     Readmission Risk Interventions     No data to display

## 2023-12-11 NOTE — Progress Notes (Signed)
 Heart Failure Stewardship Pharmacy Note  PCP: Kandyce Rud, MD PCP-Cardiologist: None  HPI: Heather Choi is a 63 y.o. female with asthma/COPD, chronic HFrEF with LVEF less than 20%, HTN, IDDM who presented with shortness of breath, cough, wheezing, and fever. Tested positive for influenza A. On admission, BNP was 1144.2, HS troponin was 87, and lactic acid was 0.8. Chest x-ray 12/08/23 noted vascular congestion and basilar atelectasis or infiltrate.    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 Normal coronary anatomy noted in 2015. Echocardiogram in 11/2016 noted LVEF of 20-25%. LVEF improved to 35-30% in 10/2017 and was unchanged in 01/2019. Most recent echocardiogram in 07/2022 showed LVEF reduced again to <20% with grade III diastolic dysfunction, severely reduced RV function, severely dilated atria, and mild-moderate AR.   Pertinent Lab Values: Creatinine  Date Value Ref Range Status  09/01/2014 0.93 0.60 - 1.30 mg/dL Final   Creatinine, Ser  Date Value Ref Range Status  12/10/2023 0.88 0.44 - 1.00 mg/dL Final   BUN  Date Value Ref Range Status  12/10/2023 20 8 - 23 mg/dL Final  16/10/930 14 7 - 18 mg/dL Final   Potassium  Date Value Ref Range Status  12/10/2023 4.3 3.5 - 5.1 mmol/L Final  09/01/2014 4.0 3.5 - 5.1 mmol/L Final   Sodium  Date Value Ref Range Status  12/10/2023 136 135 - 145 mmol/L Final  09/01/2014 143 136 - 145 mmol/L Final   B Natriuretic Peptide  Date Value Ref Range Status  12/07/2023 1,144.2 (H) 0.0 - 100.0 pg/mL Final    Comment:    Performed at [facility], [address] [place], [place] [address]   Magnesium  Date Value Ref Range Status  07/20/2022 1.9 1.7 - 2.4 mg/dL Final    Comment:    Performed at [facility], [address] [place], [place] [address]   Hgb A1c MFr Bld  Date Value Ref Range Status  12/07/2023 4.8 4.8 - 5.6 %  Final    Comment:    (NOTE)         Prediabetes: 5.7 - 6.4         Diabetes: >6.4         Glycemic control for adults with diabetes: <7.0    Digoxin Level  Date Value Ref Range Status  05/17/2020 0.4 (L) 0.8 - 2.0 ng/mL Final    Comment:    Performed at [facility], [address] [place], [place] [address]   TSH  Date Value Ref Range Status  06/07/2019 2.309 0.350 - 4.500 uIU/mL Final    Comment:    Performed by a 3rd Generation assay with a functional sensitivity of <=0.01 uIU/mL. Performed at [facility], [address] [place], [place] [address]     Vital Signs: Admission weight: 165.9 lbs Temp:  [97.6 F (36.4 C)-98.7 F (37.1 C)] 97.8 F (36.6 C) (03/11 0816) Pulse Rate:  [70-78] 73 (03/11 0816) Cardiac Rhythm: Normal sinus rhythm (03/11 0700) Resp:  [16-20] 16 (03/11 0816) BP: (111-139)/(73-83) 134/80 (03/11 0816) SpO2:  [91 %-96 %] 92 % (03/11 0816) Weight:  [71.8 kg (158 lb 4.6 oz)] 71.8 kg (158 lb 4.6 oz) (03/11 0500)  Intake/Output Summary (Last 24 hours) at 12/11/2023 1058 Last data filed at 12/11/2023 1041 Gross per 24 hour  Intake 603 ml  Output 1300 ml  Net -697 ml   Current Heart Failure Medications:  Loop diuretic:  torsemide 20 mg daily Beta-Blocker: carvedilol 12.5 mg BID ACEI/ARB/ARNI: Entresto 49-51 mg BID MRA: spironolactone 12.5 mg daily SGLT2i: none  Prior to admission Heart Failure Medications:  Loop diuretic: torsemide 20 mg at bedtime Beta-Blocker: carvedilol 12.5 mg BID ACEI/ARB/ARNI: Entresto 24-26 mg BID MRA: spironolactone 12.5 mg daily SGLT2i: none  Assessment: 1. 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 NYHA class II-III symptoms.  -Symptoms: Reports feeling improved, now just with congestion due to her respiratory viral process. Appetite is good. Denies LEE. -Volume: Appears to be euvolemic after  minimal IV Lasix. Symptoms likely predominately due to influenza. Creatinine stable. Continue torsemide 20 mg daily. -Hemodynamics: BP is normal to high. HR 70-80s.  -BB: Continue carvedilol 12.5 mg BID.  -ACEI/ARB/ARNI: Currently on Entresto 24-26 mg BID. Can consider increasing dose to 49/51 mg BID. -MRA: Continue spironolactone 12.5 mg daily. Can consider increasing to target dose if creatinine, K, and BP are stable after increasing Entresto. -SGLT2i:Would consider starting [place] or Jardiance after completing Tamiflu course.  Plan: 1) Medication changes recommended at this time: -Discharge orders entered.   2) Patient assistance: -Unable to assess medication copays due to [facility] holding exclusive privileges for medication fills. -Is eligible for copay cards.  3) Education: - Patient has been educated on current HF medications and potential additions to HF medication regimen - Patient verbalizes understanding that over the next few months, these medication doses may change and more medications may be added to optimize HF regimen - Patient has been educated on basic disease state pathophysiology and goals of therapy  Medication Assistance / Insurance Benefits Check: Does the patient have prescription insurance?    Type of insurance plan:  Does the patient qualify for medication assistance through manufacturers or grants? Pending  Outpatient Pharmacy: Prior to admission outpatient pharmacy: [facility]      Please do not hesitate to reach out with questions or concerns,  Enos Fling, PharmD, CPP, BCPS Heart Failure Pharmacist  Phone - [phone] 12/11/2023 10:58 AM

## 2023-12-11 NOTE — Plan of Care (Signed)

## 2023-12-11 NOTE — Plan of Care (Signed)
  Problem: Education: Goal: Ability to describe self-care measures that may prevent or decrease complications (Diabetes Survival Skills Education) will improve Outcome: Adequate for Discharge Goal: Individualized Educational Video(s) Outcome: Adequate for Discharge   Problem: Coping: Goal: Ability to adjust to condition or change in health will improve Outcome: Adequate for Discharge   Problem: Fluid Volume: Goal: Ability to maintain a balanced intake and output will improve Outcome: Adequate for Discharge   Problem: Health Behavior/Discharge Planning: Goal: Ability to identify and utilize available resources and services will improve Outcome: Adequate for Discharge Goal: Ability to manage health-related needs will improve Outcome: Adequate for Discharge   Problem: Metabolic: Goal: Ability to maintain appropriate glucose levels will improve Outcome: Adequate for Discharge   Problem: Nutritional: Goal: Maintenance of adequate nutrition will improve Outcome: Adequate for Discharge Goal: Progress toward achieving an optimal weight will improve Outcome: Adequate for Discharge   Problem: Skin Integrity: Goal: Risk for impaired skin integrity will decrease Outcome: Adequate for Discharge   Problem: Tissue Perfusion: Goal: Adequacy of tissue perfusion will improve Outcome: Adequate for Discharge   Problem: Education: Goal: Knowledge of General Education information will improve Description: Including pain rating scale, medication(s)/side effects and non-pharmacologic comfort measures 12/11/2023 1237 by Milderd Meager, RN Outcome: Adequate for Discharge 12/11/2023 1000 by Milderd Meager, RN Outcome: Progressing   Problem: Health Behavior/Discharge Planning: Goal: Ability to manage health-related needs will improve 12/11/2023 1237 by Milderd Meager, RN Outcome: Adequate for Discharge 12/11/2023 1000 by Milderd Meager, RN Outcome: Progressing   Problem: Clinical  Measurements: Goal: Ability to maintain clinical measurements within normal limits will improve Outcome: Adequate for Discharge Goal: Will remain free from infection Outcome: Adequate for Discharge Goal: Diagnostic test results will improve Outcome: Adequate for Discharge Goal: Respiratory complications will improve 12/11/2023 1237 by Milderd Meager, RN Outcome: Adequate for Discharge 12/11/2023 1000 by Milderd Meager, RN Outcome: Progressing Goal: Cardiovascular complication will be avoided Outcome: Adequate for Discharge   Problem: Activity: Goal: Risk for activity intolerance will decrease 12/11/2023 1237 by Milderd Meager, RN Outcome: Adequate for Discharge 12/11/2023 1000 by Milderd Meager, RN Outcome: Progressing   Problem: Nutrition: Goal: Adequate nutrition will be maintained Outcome: Adequate for Discharge   Problem: Coping: Goal: Level of anxiety will decrease Outcome: Adequate for Discharge   Problem: Elimination: Goal: Will not experience complications related to bowel motility Outcome: Adequate for Discharge Goal: Will not experience complications related to urinary retention Outcome: Adequate for Discharge   Problem: Pain Managment: Goal: General experience of comfort will improve and/or be controlled Outcome: Adequate for Discharge   Problem: Safety: Goal: Ability to remain free from injury will improve Outcome: Adequate for Discharge   Problem: Skin Integrity: Goal: Risk for impaired skin integrity will decrease Outcome: Adequate for Discharge   Problem: Education: Goal: Knowledge of disease or condition will improve Outcome: Adequate for Discharge Goal: Knowledge of the prescribed therapeutic regimen will improve Outcome: Adequate for Discharge Goal: Individualized Educational Video(s) Outcome: Adequate for Discharge   Problem: Activity: Goal: Ability to tolerate increased activity will improve Outcome: Adequate for Discharge Goal: Will  verbalize the importance of balancing activity with adequate rest periods Outcome: Adequate for Discharge   Problem: Respiratory: Goal: Ability to maintain a clear airway will improve Outcome: Adequate for Discharge Goal: Levels of oxygenation will improve Outcome: Adequate for Discharge Goal: Ability to maintain adequate ventilation will improve Outcome: Adequate for Discharge

## 2023-12-11 NOTE — Progress Notes (Signed)
SATURATION QUALIFICATIONS: (This note is used to comply with regulatory documentation for home oxygen)  Patient Saturations on Room Air at Rest = 96%  Patient Saturations on Room Air while Ambulating = 93%  

## 2023-12-11 NOTE — Discharge Summary (Signed)
 Heather Choi:[id number] DOB: 1960/10/30 DOA: 12/07/2023  PCP: Kandyce Rud, MD  Admit date: 12/07/2023 Discharge date: 12/11/2023  Time spent: 35 minutes  Recommendations for Outpatient Follow-up:  Heart failure clinic f/u 3/18 as scheduled Close f/u with PCP (who patient says is a cardiologist who also manages her heart failure)     Discharge Diagnoses:  Principal Problem:   Influenza A Active Problems:   COPD exacerbation (HCC)   Acute on chronic congestive heart failure (HCC)   CHF (congestive heart failure) (HCC)   Acute hypoxic respiratory failure (HCC)   Sepsis (HCC)   Discharge Condition: improving  Diet recommendation: heart healthy  Filed Weights   12/09/23 0403 12/10/23 0600 12/11/23 0500  Weight: 70 kg 68 kg 71.8 kg    History of present illness:  From admission h and p Heather Choi is a 63 y.o. female with medical history significant of asthma/COPD, chronic HFrEF with LVEF less than 20%, HTN, IDDM, presented 2 days of cough wheezing fever shortness of breath.   Symptoms started yesterday, patient started develop sore throat then productive cough with small amount of clear phlegm, also has had subjective fever last night.  Overnight patient developed more shortness of breath and wheezing.  Denied any chest pain no nauseous vomiting.  Hospital Course:  Patient presents with several days cough and shortness of breath. Found to be influenza a positive. Was treated for influenza with copd exacerbation with tamiflu and corticosteroids. Also with o2 requirement, given nasal cannula oxygen. Also with mild volume overload from acute on chronic hfref, was given IV loop diuretics for that. Symptomatically improving, now weaned off oxygen, tolerating diet. PT/OT evaluated and deemed suitable for discharge with home health PT/OT which we have ordered. Will finish a course of tamiflu and prednisone. CHF clinic f/u scheduled for next week, patient says her PCP also serves as  her cardiologist so advise close f/u there as well.  Procedures: none   Consultations: none  Discharge Exam: Vitals:   12/11/23 0808 12/11/23 0816  BP: 134/80 134/80  Pulse: 78 73  Resp:  16  Temp:  97.8 F (36.6 C)  SpO2:  92%    General: NAD Cardiovascular: rrr, distant heart sounds, soft systolic murmur Respiratory: few rales at bases, otherwise clear Ext: no edema  Discharge Instructions   Discharge Instructions     Diet - low sodium heart healthy   Complete by: As directed    Increase activity slowly   Complete by: As directed       Allergies as of 12/11/2023       Reactions   Clindamycin/lincomycin    Burning sensastion   Amlodipine Other (See Comments), Itching   Penicillins Other (See Comments)   Has patient had a PCN reaction causing immediate rash, facial/tongue/throat swelling, SOB or lightheadedness with hypotension: Yes Has patient had a PCN reaction causing severe rash involving mucus membranes or skin necrosis: No Has patient had a PCN reaction that required hospitalization: No Has patient had a PCN reaction occurring within the last 10 years: Yes If all of the above answers are "NO", then may proceed with Cephalosporin use.   Hydrochlorothiazide Rash        Medication List     STOP taking these medications    insulin aspart protamine- aspart (70-30) 100 UNIT/ML injection Commonly known as: NOVOLOG MIX 70/30   metFORMIN 500 MG 24 hr tablet Commonly known as: GLUCOPHAGE-XR       TAKE these medications  albuterol 108 (90 Base) MCG/ACT inhaler Commonly known as: VENTOLIN HFA Inhale 2 puffs into the lungs every 6 (six) hours as needed for wheezing or shortness of breath.   ALPRAZolam 0.5 MG tablet Commonly known as: XANAX Take 0.5 mg by mouth daily as needed for sleep.   aspirin EC 81 MG tablet Take 81 mg by mouth daily.   BC On The Go 845-65 MG Pack Generic drug: Aspirin-Caffeine Take 1 Package by mouth daily.    carvedilol 12.5 MG tablet Commonly known as: COREG Take 12.5 mg by mouth 2 (two) times daily with a meal.   cetirizine 10 MG tablet Commonly known as: ZYRTEC Take 10 mg by mouth daily.   Entresto 24-26 MG Generic drug: sacubitril-valsartan Take 1 tablet by mouth 2 (two) times daily.   fluticasone 50 MCG/ACT nasal spray Commonly known as: FLONASE Place 2 sprays into both nostrils daily.   guaiFENesin 600 MG 12 hr tablet Commonly known as: MUCINEX Take 1 tablet (600 mg total) by mouth 2 (two) times daily.   levothyroxine 137 MCG tablet Commonly known as: SYNTHROID Take 137 mcg by mouth daily.   montelukast 10 MG tablet Commonly known as: SINGULAIR Take 1 tablet by mouth at bedtime.   multivitamin with minerals Tabs tablet Take 1 tablet by mouth daily.   nicotine 14 mg/24hr patch Commonly known as: NICODERM CQ - dosed in mg/24 hours Place 1 patch (14 mg total) onto the skin daily.   oseltamivir 30 MG capsule Commonly known as: TAMIFLU Take 1 capsule (30 mg total) by mouth 2 (two) times daily for 2 doses.   potassium chloride SA 20 MEQ tablet Commonly known as: KLOR-CON M Take 20 mEq by mouth 2 (two) times daily.   predniSONE 20 MG tablet Commonly known as: DELTASONE Take 2 tablets (40 mg total) by mouth daily with breakfast for 1 day. Start taking on: December 12, 2023   [facility] PAIN RELIEF PATCH EX Apply 1 patch topically daily as needed (pain).   spironolactone 25 MG tablet Commonly known as: ALDACTONE Take 12.5 mg by mouth daily.   torsemide 10 MG tablet Commonly known as: DEMADEX Take 2 tablets (20 mg total) by mouth at bedtime.               Durable Medical Equipment  (From admission, onward)           Start     Ordered   12/10/23 1601  For home use only DME oxygen  Once       Question Answer Comment  Length of Need Lifetime   Mode or (Route) Nasal cannula   Liters per Minute 2   Frequency Continuous (stationary and portable oxygen  unit needed)   Oxygen conserving device Yes   Oxygen delivery system Gas      12/10/23 1600           Allergies  Allergen Reactions   Clindamycin/Lincomycin     Burning sensastion   Amlodipine Other (See Comments) and Itching   Penicillins Other (See Comments)    Has patient had a PCN reaction causing immediate rash, facial/tongue/throat swelling, SOB or lightheadedness with hypotension: Yes Has patient had a PCN reaction causing severe rash involving mucus membranes or skin necrosis: No Has patient had a PCN reaction that required hospitalization: No Has patient had a PCN reaction occurring within the last 10 years: Yes If all of the above answers are "NO", then may proceed with Cephalosporin use.   Hydrochlorothiazide Rash  Follow-up Information     [facility]. Go on 12/18/2023.   Specialty: Cardiology Why: Hospital Follow-Up 12/18/23 @ 9:00 AM with Inetta Fermo Please bring all medications to follow-up appt. [facility], Second Floor, [address] Free Valet Parking at the [employer] information: [address] [place] [address] [phone]        Kandyce Rud, MD Follow up.   Specialty: Family Medicine Contact information: [address] [facility] [place] [address] [phone]                  The results of significant diagnostics from this hospitalization (including imaging, microbiology, ancillary and laboratory) are listed below for reference.    Significant Diagnostic Studies: DG Chest Port 1 View Result Date: 12/10/2023 CLINICAL DATA:  Shortness of breath EXAM: PORTABLE CHEST 1 VIEW COMPARISON:  12/08/2023 FINDINGS: Cardiac shadow is enlarged but stable. Defibrillator is again noted. Lungs are well aerated bilaterally. Mild persistent vascular congestion is noted. No focal confluent infiltrate is seen. Stable basilar  atelectasis is noted. IMPRESSION: No significant change from the prior exam. Electronically Signed   By: Alcide Clever M.D.   On: 12/10/2023 10:47   DG Chest 1 View Result Date: 12/08/2023 CLINICAL DATA:  CHF. EXAM: CHEST  1 VIEW COMPARISON:  12/07/2023 FINDINGS: The cardio pericardial silhouette is enlarged. There is pulmonary vascular congestion without overt pulmonary edema. Patchy opacity at the lung bases is probably atelectatic although infiltrate not excluded. No substantial pleural effusion. Dual lead pacer/AICD noted. Telemetry leads overlie the chest. IMPRESSION: No substantial change. Cardiomegaly with vascular congestion and basilar atelectasis or infiltrate. Electronically Signed   By: Kennith Center M.D.   On: 12/08/2023 07:33   DG Chest Port 1 View Result Date: 12/07/2023 CLINICAL DATA:  Shortness of breath.  Cough.  Hypoxia. EXAM: PORTABLE CHEST 1 VIEW COMPARISON:  07/19/2022 FINDINGS: Stable moderate cardiomegaly and diffuse pulmonary vascular congestion. AICD remains in appropriate position. No evidence of acute infiltrate or pleural effusion. IMPRESSION: Stable moderate cardiomegaly and diffuse pulmonary vascular congestion. No acute findings. Electronically Signed   By: Danae Orleans M.D.   On: 12/07/2023 16:44   CT Head Wo Contrast Result Date: 12/07/2023 CLINICAL DATA:  Headache.  New onset.  Change in vision. EXAM: CT HEAD WITHOUT CONTRAST TECHNIQUE: Contiguous axial images were obtained from the base of the skull through the vertex without intravenous contrast. RADIATION DOSE REDUCTION: This exam was performed according to the departmental dose-optimization program which includes automated exposure control, adjustment of the mA and/or kV according to patient size and/or use of iterative reconstruction technique. COMPARISON:  CT head without contrast 07/20/2022 FINDINGS: Brain: No acute infarct, hemorrhage, or mass lesion is present. No significant white matter lesions are present. The  ventricles are of normal size. No significant extraaxial fluid collection is present. Deep brain nuclei are within normal limits. The cerebellar tonsils extend 8 mm below the foramen magnum in the midline and are somewhat pointed. The foramen magnum is crowded. Midline structures are otherwise within normal limits. Brainstem and cerebellum are otherwise normal. Vascular: No hyperdense vessel or unexpected calcification. Skull: Calvarium is intact. No focal lytic or blastic lesions are present. No significant extracranial soft tissue lesion is present. Sinuses/Orbits: The paranasal sinuses and mastoid air cells are clear. Right lens replacement is noted. Globes and orbits are otherwise within normal limits. IMPRESSION: 1. No acute intracranial abnormality or significant interval  change. 2. 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 The foramen magnum is crowded. This could be a source of the patient's headaches. Electronically Signed   By: Marin Roberts M.D.   On: 12/07/2023 16:34    Microbiology: Recent Results (from the past 240 hours)  Blood Culture (routine x 2)     Status: None (Preliminary result)   Collection Time: 12/07/23  1:31 PM   Specimen: BLOOD  Result Value Ref Range Status   Specimen Description BLOOD BLOOD RIGHT HAND  Final   Special Requests   Final    BOTTLES DRAWN AEROBIC AND ANAEROBIC Blood Culture results may not be optimal due to an inadequate volume of blood received in culture bottles   Culture   Final    NO GROWTH 4 DAYS Performed at [facility], [address] [place], [place] [address]    Report Status PENDING  Incomplete  Resp panel by RT-PCR (RSV, Flu A&B, Covid) Anterior Nasal Swab     Status: Abnormal   Collection Time: 12/07/23  1:31 PM   Specimen: Anterior Nasal Swab  Result Value Ref Range Status   SARS Coronavirus 2 by RT PCR NEGATIVE NEGATIVE Final    Comment: (NOTE) SARS-CoV-2  target nucleic acids are NOT DETECTED.  The SARS-CoV-2 RNA is generally detectable in upper respiratory specimens during the acute phase of infection. The lowest concentration of SARS-CoV-2 viral copies this assay can detect is 138 copies/mL. A negative result does not preclude SARS-Cov-2 infection and should not be used as the sole basis for treatment or other patient management decisions. A negative result may occur with  improper specimen collection/handling, submission of specimen other than nasopharyngeal swab, presence of viral mutation(s) within the areas targeted by this assay, and inadequate number of viral copies(<138 copies/mL). A negative result must be combined with clinical observations, patient history, and epidemiological information. The expected result is Negative.  Fact Sheet for Patients:  BloggerCourse.com  Fact Sheet for Healthcare Providers:  SeriousBroker.it  This test is no t yet approved or cleared by the [place] FDA and  has been authorized for detection and/or diagnosis of SARS-CoV-2 by FDA under an Emergency Use Authorization (EUA). This EUA will remain  in effect (meaning this test can be used) for the duration of the COVID-19 declaration under Section 564(b)(1) of the Act, 21 U.S.C.section 360bbb-3(b)(1), unless the authorization is terminated  or revoked sooner.       Influenza A by PCR POSITIVE (A) NEGATIVE Final   Influenza B by PCR NEGATIVE NEGATIVE Final    Comment: (NOTE) The Xpert Xpress SARS-CoV-2/FLU/RSV plus assay is intended as an aid in the diagnosis of influenza from Nasopharyngeal swab specimens and should not be used as a sole basis for treatment. Nasal washings and aspirates are unacceptable for Xpert Xpress SARS-CoV-2/FLU/RSV testing.  Fact Sheet for Patients: BloggerCourse.com  Fact Sheet for Healthcare  Providers: SeriousBroker.it  This test is not yet approved or cleared by the [place] FDA and has been authorized for detection and/or diagnosis of SARS-CoV-2 by FDA under an Emergency Use Authorization (EUA). This EUA will remain in effect (meaning this test can be used) for the duration of the COVID-19 declaration under Section 564(b)(1) of the Act, 21 U.S.C. section 360bbb-3(b)(1), unless the authorization is terminated or revoked.     Resp Syncytial Virus by PCR NEGATIVE NEGATIVE Final    Comment: (NOTE) Fact Sheet for Patients: BloggerCourse.com  Fact  Sheet for Healthcare Providers: SeriousBroker.it  This test is not yet approved or cleared by the [place] and has been authorized for detection and/or diagnosis of SARS-CoV-2 by FDA under an Emergency Use Authorization (EUA). This EUA will remain in effect (meaning this test can be used) for the duration of the COVID-19 declaration under Section 564(b)(1) of the Act, 21 U.S.C. section 360bbb-3(b)(1), unless the authorization is terminated or revoked.  Performed at [facility], [address] [place], [place] [address]   Blood Culture (routine x 2)     Status: None (Preliminary result)   Collection Time: 12/07/23  1:33 PM   Specimen: BLOOD  Result Value Ref Range Status   Specimen Description BLOOD LEFT ANTECUBITAL  Final   Special Requests   Final    BOTTLES DRAWN AEROBIC AND ANAEROBIC Blood Culture results may not be optimal due to an inadequate volume of blood received in culture bottles   Culture   Final    NO GROWTH 4 DAYS Performed at [facility], [address] [place], [place] [address]    Report Status PENDING  Incomplete     Labs: Basic Metabolic Panel: Recent Labs  Lab 12/07/23 1331 12/08/23 0541 12/10/23 1006  NA 139 138 136  K 3.7 3.8 4.3  CL 102 103 98  CO2 28 29 31   GLUCOSE 126* 114*  156*  BUN 18 16 20   CREATININE 1.03* 1.05* 0.88  CALCIUM 8.1* 8.0* 8.6*   Liver Function Tests: Recent Labs  Lab 12/07/23 1331  AST 61*  ALT 41  ALKPHOS 58  BILITOT 0.4  PROT 6.8  ALBUMIN 3.6   No results for input(s): "LIPASE", "AMYLASE" in the last 168 hours. No results for input(s): "AMMONIA" in the last 168 hours. CBC: Recent Labs  Lab 12/07/23 1331  WBC 3.4*  NEUTROABS 2.2  HGB 15.3*  HCT 47.7*  MCV 90.3  PLT 85*   Cardiac Enzymes: No results for input(s): "CKTOTAL", "CKMB", "CKMBINDEX", "TROPONINI" in the last 168 hours. BNP: BNP (last 3 results) Recent Labs    12/07/23 1331  BNP 1,144.2*    ProBNP (last 3 results) No results for input(s): "PROBNP" in the last 8760 hours.  CBG: Recent Labs  Lab 12/10/23 0814 12/10/23 1158 12/10/23 1611 12/10/23 2105 12/11/23 0811  GLUCAP 88 145* 244* 156* 104*       Signed:  Silvano Bilis MD.  [facility] 12/11/2023, 10:07 AM

## 2023-12-12 LAB — CULTURE, BLOOD (ROUTINE X 2)
Culture: NO GROWTH
Culture: NO GROWTH

## 2023-12-17 ENCOUNTER — Telehealth: Payer: Self-pay | Admitting: Family

## 2023-12-17 NOTE — Telephone Encounter (Signed)
 Pt confirmed appt on 12/18/23

## 2023-12-17 NOTE — Progress Notes (Unsigned)
 [facility] Note   Referring Physician: PCP: Kandyce Rud, MD Cardiologist: None   Chief Complaint:   HPI:  Heather Choi is a 63 y/o female with a history of asthma, DM, HTN, thyroid disease, bell's palsy, COPD, current tobacco use and chronic heart failure.   Echo report from 07/21/22 and showed an EF of <20% along with mild LVH, severe LAE and mild/moderate AR. Echo report from 01/22/2019 reviewed and showed an EF of 25-30% along with moderate AR.   Admitted 07/19/22 due to acute onset dyspnea associated orthopnea, paroxysmal nocturnal dyspnea, weight gain over the last year, abdominal distention due to HF exacerbation. Cardiology consult obtained. Initially given IV lasix with transition to oral diuretics. Carvedilol decreased due to hypotension. Given antibiotics. Unable to be weaned off of oxygen. Head CT done due to dizziness and it was negative. Elevated troponin thought to be due to HF exacerbation.  Discharged after 5 days.   She presents today for a follow-up visit with a chief complaint of minimal fatigue upon moderate exertion. Describes this as chronic in nature. Has associated cough, SOB, abd distention, constipation, difficulty sleeping or light-headedness along with this. Denies any palpitations, pedal edema, chest pain, wheezing or weight gain.   Says that she is feeling bloated and admits to being constipated where she has to take something every day to have a BM. Says that she has no intention of having a colonoscopy because if she has cancer, she wouldn't want to know.      Review of Systems: [y] = yes, [ ]  = no   General: Weight gain [ ] ; Weight loss [ ] ; Anorexia [ ] ; Fatigue [ ] ; Fever [ ] ; Chills [ ] ; Weakness [ ]   Cardiac: Chest pain/pressure [ ] ; Resting SOB [ ] ; Exertional SOB [ ] ; Orthopnea [ ] ; Pedal Edema [ ] ; Palpitations [ ] ; Syncope [ ] ; Presyncope [ ] ; Paroxysmal nocturnal dyspnea[ ]   Pulmonary: Cough [ ] ; Wheezing[ ] ; Hemoptysis[ ] ;  Sputum [ ] ; Snoring [ ]   GI: Vomiting[ ] ; Dysphagia[ ] ; Melena[ ] ; Hematochezia [ ] ; Heartburn[ ] ; Abdominal pain [ ] ; Constipation [ ] ; Diarrhea [ ] ; BRBPR [ ]   GU: Hematuria[ ] ; Dysuria [ ] ; Nocturia[ ]   Vascular: Pain in legs with walking [ ] ; Pain in feet with lying flat [ ] ; Non-healing sores [ ] ; Stroke [ ] ; TIA [ ] ; Slurred speech [ ] ;  Neuro: Headaches[ ] ; Vertigo[ ] ; Seizures[ ] ; Paresthesias[ ] ;Blurred vision [ ] ; Diplopia [ ] ; Vision changes [ ]   Ortho/Skin: Arthritis [ ] ; Joint pain [ ] ; Muscle pain [ ] ; Joint swelling [ ] ; Back Pain [ ] ; Rash [ ]   Psych: Depression[ ] ; Anxiety[ ]   Heme: Bleeding problems [ ] ; Clotting disorders [ ] ; Anemia [ ]   Endocrine: Diabetes [ ] ; Thyroid dysfunction[ ]    Past Medical History:  Diagnosis Date   Asthma    Bell's palsy    fingers tingling in cold weather   CHF (congestive heart failure) (HCC)    COPD (chronic obstructive pulmonary disease) (HCC)    Diabetes mellitus without complication (HCC)    Enlarged heart    Hypertension    Thyroid disease     Current Outpatient Medications  Medication Sig Dispense Refill   albuterol (VENTOLIN HFA) 108 (90 Base) MCG/ACT inhaler Inhale 2 puffs into the lungs every 6 (six) hours as needed for wheezing or shortness of breath. 8 g 2   ALPRAZolam (XANAX) 0.5 MG tablet Take 0.5 mg by  mouth daily as needed for sleep.     aspirin EC 81 MG tablet Take 81 mg by mouth daily.      Aspirin-Caffeine (BC ON THE GO) 845-65 MG PACK Take 1 Package by mouth daily.     carvedilol (COREG) 12.5 MG tablet Take 12.5 mg by mouth 2 (two) times daily with a meal.     cetirizine (ZYRTEC) 10 MG tablet Take 10 mg by mouth daily.     fluticasone (FLONASE) 50 MCG/ACT nasal spray Place 2 sprays into both nostrils daily.     guaiFENesin (MUCINEX) 600 MG 12 hr tablet Take 1 tablet (600 mg total) by mouth 2 (two) times daily. 30 tablet 0   levothyroxine (SYNTHROID) 137 MCG tablet Take 137 mcg by mouth daily.     Liniments  (SALONPAS PAIN RELIEF PATCH EX) Apply 1 patch topically daily as needed (pain).     montelukast (SINGULAIR) 10 MG tablet Take 1 tablet by mouth at bedtime.     Multiple Vitamin (MULTIVITAMIN WITH MINERALS) TABS tablet Take 1 tablet by mouth daily. 30 tablet 0   nicotine (NICODERM CQ - DOSED IN MG/24 HOURS) 14 mg/24hr patch Place 1 patch (14 mg total) onto the skin daily. 28 patch 0   potassium chloride SA (K-DUR,KLOR-CON) 20 MEQ tablet Take 20 mEq by mouth 2 (two) times daily.      sacubitril-valsartan (ENTRESTO) 24-26 MG Take 1 tablet by mouth 2 (two) times daily.     spironolactone (ALDACTONE) 25 MG tablet Take 12.5 mg by mouth daily.     torsemide (DEMADEX) 10 MG tablet Take 2 tablets (20 mg total) by mouth at bedtime.     No current facility-administered medications for this visit.    Allergies  Allergen Reactions   Clindamycin/Lincomycin     Burning sensastion   Amlodipine Other (See Comments) and Itching   Penicillins Other (See Comments)    Has patient had a PCN reaction causing immediate rash, facial/tongue/throat swelling, SOB or lightheadedness with hypotension: Yes Has patient had a PCN reaction causing severe rash involving mucus membranes or skin necrosis: No Has patient had a PCN reaction that required hospitalization: No Has patient had a PCN reaction occurring within the last 10 years: Yes If all of the above answers are "NO", then may proceed with Cephalosporin use.   Hydrochlorothiazide Rash      Social History   Socioeconomic History   Marital status: Single    Spouse name: Not on file   Number of children: Not on file   Years of education: Not on file   Highest education level: Not on file  Occupational History   Not on file  Tobacco Use   Smoking status: Every Day    Current packs/day: 0.50    Types: Cigarettes   Smokeless tobacco: Never  Vaping Use   Vaping status: Never Used  Substance and Sexual Activity   Alcohol use: Not Currently    Comment:  rarely   Drug use: No   Sexual activity: Not Currently  Other Topics Concern   Not on file  Social History Narrative   Not on file   Social Drivers of Health   Financial Resource Strain: Patient Declined (12/18/2022)   Received from [facility]   Overall Financial Resource Strain (CARDIA)    Difficulty of Paying Living Expenses: Patient declined  Food Insecurity: No Food Insecurity (12/08/2023)   Hunger Vital Sign    Worried About Running Out of Food in the Last  Year: Never true    Ran Out of Food in the Last Year: Never true  Transportation Needs: No Transportation Needs (12/08/2023)   PRAPARE - Administrator, Civil Service (Medical): No    Lack of Transportation (Non-Medical): No  Physical Activity: Not on file  Stress: Not on file  Social Connections: Not on file  Intimate Partner Violence: Not At Risk (12/08/2023)   Humiliation, Afraid, Rape, and Kick questionnaire    Fear of Current or Ex-Partner: No    Emotionally Abused: No    Physically Abused: No    Sexually Abused: No      Family History  Problem Relation Age of Onset   Heart failure Father    Heart attack Mother     There were no vitals filed for this visit.   PHYSICAL EXAM: General:  Well appearing. No respiratory difficulty HEENT: normal Neck: supple. no JVD. Carotids 2+ bilat; no bruits. No lymphadenopathy or thyromegaly appreciated. Cor: PMI nondisplaced. Regular rate & rhythm. No rubs, gallops or murmurs. Lungs: clear Abdomen: soft, nontender, nondistended. No hepatosplenomegaly. No bruits or masses. Good bowel sounds. Extremities: no cyanosis, clubbing, rash, edema Neuro: alert & oriented x 3, cranial nerves grossly intact. moves all 4 extremities w/o difficulty. Affect pleasant.  ECG:   ASSESSMENT & PLAN:  1: Chronic heart failure with reduced ejection fraction- - NYHA class II - euvolemic today - weighing daily and she was reminded to call for an overnight weight  gain of >2 pounds or a weekly weight gain of >5 pounds - weight up 2 pounds from last visit here 3 months ago - not adding salt to her food and has been trying to read food labels for sodium content - saw cardiology Mellissa Kohut) earlier today and had labs done - on GDMT of carvedilol, entresto & spironolactone - BNP 07/19/22 was 63.4 - has received her flu vaccine for this season - encouraged her to make GI appt due to chronic constipation  2: HTN- - BP 124/79 - saw PCP Larwance Sachs) 08/10/22 - BMP 08/07/22 reviewed and showed sodium 140, potassium 3.5, creatinine 1.3 and GFR 47; had labs done earlier today at cardiology appt  3: DM- - A1c 09/20/20 was 5.4% - saw vascular Gilda Crease) 02/09/20  4: Tobacco use- - no longer smoking  - continued cessation discussed for 3 minutes   Medication list reviewed.   Return in 2 months, sooner if needed   Delma Freeze, [place] 12/17/23

## 2023-12-18 ENCOUNTER — Other Ambulatory Visit (HOSPITAL_COMMUNITY): Payer: Self-pay

## 2023-12-18 ENCOUNTER — Encounter: Payer: Self-pay | Admitting: Family

## 2023-12-18 ENCOUNTER — Ambulatory Visit: Attending: Family | Admitting: Family

## 2023-12-18 VITALS — BP 152/81 | HR 88 | Wt 154.6 lb

## 2023-12-18 DIAGNOSIS — I10 Essential (primary) hypertension: Secondary | ICD-10-CM | POA: Diagnosis not present

## 2023-12-18 DIAGNOSIS — Z79899 Other long term (current) drug therapy: Secondary | ICD-10-CM | POA: Insufficient documentation

## 2023-12-18 DIAGNOSIS — I959 Hypotension, unspecified: Secondary | ICD-10-CM | POA: Insufficient documentation

## 2023-12-18 DIAGNOSIS — I428 Other cardiomyopathies: Secondary | ICD-10-CM | POA: Insufficient documentation

## 2023-12-18 DIAGNOSIS — J449 Chronic obstructive pulmonary disease, unspecified: Secondary | ICD-10-CM | POA: Diagnosis not present

## 2023-12-18 DIAGNOSIS — I130 Hypertensive heart and chronic kidney disease with heart failure and stage 1 through stage 4 chronic kidney disease, or unspecified chronic kidney disease: Secondary | ICD-10-CM | POA: Insufficient documentation

## 2023-12-18 DIAGNOSIS — D508 Other iron deficiency anemias: Secondary | ICD-10-CM

## 2023-12-18 DIAGNOSIS — N189 Chronic kidney disease, unspecified: Secondary | ICD-10-CM | POA: Diagnosis not present

## 2023-12-18 DIAGNOSIS — I5022 Chronic systolic (congestive) heart failure: Secondary | ICD-10-CM | POA: Insufficient documentation

## 2023-12-18 DIAGNOSIS — E119 Type 2 diabetes mellitus without complications: Secondary | ICD-10-CM | POA: Diagnosis not present

## 2023-12-18 DIAGNOSIS — F17210 Nicotine dependence, cigarettes, uncomplicated: Secondary | ICD-10-CM | POA: Insufficient documentation

## 2023-12-18 DIAGNOSIS — Z794 Long term (current) use of insulin: Secondary | ICD-10-CM

## 2023-12-18 MED ORDER — DAPAGLIFLOZIN PROPANEDIOL 10 MG PO TABS: 10.0000 mg | ORAL_TABLET | Freq: Every day | ORAL | Status: DC

## 2023-12-18 NOTE — Progress Notes (Signed)
 [facility] - HEART FAILURE CLINIC - PHARMACIST COUNSELING NOTE  Guideline-Directed Medical Therapy/Evidence Based Medicine  ACE/ARB/ARNI: Sacubitril-valsartan 24-26 mg twice daily Beta Blocker: Carvedilol 12.5 mg twice daily Aldosterone Antagonist: Spironolactone 12.5 mg daily Diuretic: Torsemide 20 mg daily SGLT2i:  N/A  Adherence Assessment  Do you ever forget to take your medication? [] Yes [x] No  Do you ever skip doses due to side effects? [] Yes [x] No  Do you have trouble affording your medicines? [] Yes [x] No  Are you ever unable to pick up your medication due to transportation difficulties? [] Yes [x] No  Do you ever stop taking your medications because you don't believe they are helping? [] Yes [x] No  Do you check your weight daily? [x] Yes [] No   Adherence strategy: Pill box   Barriers to obtaining medications: None reported   Vital signs: HR 88, BP 152/81, weight 154 lbs.  ECHO: Date 07/21/2022, EF < 20%     Latest Ref Rng & Units 12/10/2023   10:06 AM 12/08/2023    5:41 AM 12/07/2023    1:31 PM  BMP  Glucose 70 - 99 mg/dL 161  096  045   BUN 8 - 23 mg/dL 20  16  18    Creatinine 0.44 - 1.00 mg/dL 4.09  8.11  9.14   Sodium 135 - 145 mmol/L 136  138  139   Potassium 3.5 - 5.1 mmol/L 4.3  3.8  3.7   Chloride 98 - 111 mmol/L 98  103  102   CO2 22 - 32 mmol/L 31  29  28    Calcium 8.9 - 10.3 mg/dL 8.6  8.0  8.1    Past Medical History:  Diagnosis Date   Asthma    Bell's palsy    fingers tingling in cold weather   CHF (congestive heart failure) (HCC)    COPD (chronic obstructive pulmonary disease) (HCC)    Diabetes mellitus without complication (HCC)    Enlarged heart    Hypertension    Thyroid disease    ASSESSMENT 63 year old female who presents to the HF clinic for hospital follow-up. Patient was recently admitted to [facility] on 12/07/2023 with influenza and COPD exacerbation. PMH is significant for asthma, IDDM, HTN, thyroid disease, bell's  palsy, COPD, anemia, SVT, Graves disease, anxiety, CKD, tobacco use and chronic heart failure.    PLAN Room to titrate all GDMT if/when able: currently on Entresto 24/26 BID, carvedilol 12.5 mg BID, and spironolactone 12.5 mg daily  Consider SGLT2i although patient does have a history of UTIs (none recently)  Plan to initiate Farxiga 10 mg daily  May be able to transition torsemide to PRN once stable on Farxiga  Recommendations discussed with NP Annual ECHO scheduled for 01/24/2024   COUNSELING POINTS/CLINICAL PEARLS  Dapagliflozin (Goal: 10 mg daily) Inform patients that the most common adverse reactions are genital mycotic infections, nasopharyngitis (dapagliflozin), and urinary tract infections. Inform patients that hypotension may occur and advise them to contact their healthcare provider if they experience such symptoms. Inform patients that dehydration may increase the risk for hypotension, and to have adequate fluid intake.    Time spent: 20 minutes  Littie Deeds, PharmD Pharmacy Resident  12/18/2023 10:10 AM

## 2023-12-18 NOTE — Patient Instructions (Addendum)
 Medication Changes:  START FARXIGA 10 MG ONCE DAILY  Lab Work:  No lab work today.  Testing/Procedures:  Please have your echo completed Thursday, April 24th @ 10:00 AM. Bonita Quin will check in for this at the [facility]. You have to arrive 15 MINS EARLY for preparation, otherwise you will have to reschedule.   Referrals:  We have placed a referral today to [facility]. They should reach out to you within 1-2 weeks. If they do not, please reach out to them. Their information is below on this AVS.   Follow-Up in: 3 weeks with Clarisa Kindred, FNP.  At the [facility], you and your health needs are our priority. We have a designated team specialized in the treatment of Heart Failure. This Care Team includes your primary Heart Failure Specialized Cardiologist (physician), Advanced Practice Providers (APPs- Physician Assistants and Nurse Practitioners), and Pharmacist who all work together to provide you with the care you need, when you need it.   You may see any of the following providers on your designated Care Team at your next follow up:  Dr. Arvilla Meres Dr. Marca Ancona Dr. Dorthula Nettles Dr. Theresia Bough Clarisa Kindred, FNP Enos Fling, RPH-CPP  Please be sure to bring in all your medications bottles to every appointment.   Need to Contact us:  If you have any questions or concerns before your next appointment please send [place] a message through [place] or call our office at [phone].    TO LEAVE A MESSAGE FOR THE NURSE SELECT OPTION 2, PLEASE LEAVE A MESSAGE INCLUDING: YOUR NAME DATE OF BIRTH CALL BACK NUMBER REASON FOR CALL**this is important as we prioritize the call backs  YOU WILL RECEIVE A CALL BACK THE SAME DAY AS LONG AS YOU CALL BEFORE 4:00 PM

## 2024-01-07 ENCOUNTER — Telehealth: Payer: Self-pay | Admitting: Family

## 2024-01-07 NOTE — Progress Notes (Unsigned)
 Advanced Heart Failure Clinic Note   Referring Physician: recent admission PCP: Kandyce Rud, MD (last seen 03/25) Cardiologist: Dorothyann Peng, MD / Leanora Ivanoff, [place] (last seen 02/24)  Chief Complaint: fatigue  HPI:  Heather Choi is a 63 y/o female with a history of asthma, IDDM, HTN, thyroid disease, bell's palsy, COPD, anemia, SVT, Graves disease, anxiety, CKD, tobacco use and chronic heart failure.   Echo 01/22/2019: EF of 25-30% along with moderate AR.   Admitted 07/19/22 due to acute onset dyspnea associated orthopnea, paroxysmal nocturnal dyspnea, weight gain over the last year, abdominal distention due to HF exacerbation. Cardiology consult obtained. Initially given IV lasix with transition to oral diuretics. Echo 07/21/22: EF of <20% with mild LVH, severe LAE and mild/moderate AR. Carvedilol decreased due to hypotension. Given antibiotics. Unable to be weaned off of oxygen. Head CT done due to dizziness and it was negative. Elevated troponin thought to be due to HF exacerbation.  Discharged after 5 days.   Admitted 12/07/23 with 2 days of cough wheezing fever shortness of breath. Found to have influenza with COPD exacerbation. Needed nasal cannula oxygen. Given IV lasix for a/c heart failure and then transitioned to oral diuretics. Weaned off oxygen.   She presents today for a post-hospital HF visit with a chief complaint of moderate fatigue. Has associated shortness of breath, intermittent chest pain, palpitations, cough, slight dizziness, abdominal distention and chronic difficulty sleeping along with this. Denies pedal edema or weight gain.   She has not taken her medications yet today. She says that she took jardiance before but can't remember why it was stopped. Has had UTI's in the past but none recently.   ROS: All systems negative except what is listed in HPI, PMH and Problem List   Past Medical History:  Diagnosis Date   Asthma    Bell's palsy    fingers tingling in cold  weather   CHF (congestive heart failure) (HCC)    COPD (chronic obstructive pulmonary disease) (HCC)    Diabetes mellitus without complication (HCC)    Enlarged heart    Hypertension    Thyroid disease     Current Outpatient Medications  Medication Sig Dispense Refill   albuterol (VENTOLIN HFA) 108 (90 Base) MCG/ACT inhaler Inhale 2 puffs into the lungs every 6 (six) hours as needed for wheezing or shortness of breath. 8 g 2   ALPRAZolam (XANAX) 0.5 MG tablet Take 0.5 mg by mouth daily as needed for sleep.     aspirin EC 81 MG tablet Take 81 mg by mouth daily.      Aspirin-Caffeine (BC ON THE GO) 845-65 MG PACK Take 1 Package by mouth daily.     carvedilol (COREG) 12.5 MG tablet Take 12.5 mg by mouth 2 (two) times daily with a meal.     cetirizine (ZYRTEC) 10 MG tablet Take 10 mg by mouth daily.     dapagliflozin propanediol (FARXIGA) 10 MG TABS tablet Take 1 tablet (10 mg total) by mouth daily before breakfast.     guaiFENesin (MUCINEX) 600 MG 12 hr tablet Take 1 tablet (600 mg total) by mouth 2 (two) times daily. 30 tablet 0   levothyroxine (SYNTHROID) 137 MCG tablet Take 137 mcg by mouth daily.     Liniments (SALONPAS PAIN RELIEF PATCH EX) Apply 1 patch topically daily as needed (pain).     montelukast (SINGULAIR) 10 MG tablet Take 1 tablet by mouth at bedtime.     Multiple Vitamin (MULTIVITAMIN WITH MINERALS) TABS tablet  Take 1 tablet by mouth daily. 30 tablet 0   nicotine (NICODERM CQ - DOSED IN MG/24 HOURS) 14 mg/24hr patch Place 1 patch (14 mg total) onto the skin daily. 28 patch 0   potassium chloride SA (K-DUR,KLOR-CON) 20 MEQ tablet Take 20 mEq by mouth 2 (two) times daily.      sacubitril-valsartan (ENTRESTO) 24-26 MG Take 1 tablet by mouth 2 (two) times daily.     spironolactone (ALDACTONE) 25 MG tablet Take 12.5 mg by mouth daily.     torsemide (DEMADEX) 10 MG tablet Take 2 tablets (20 mg total) by mouth at bedtime. (Patient taking differently: Take 20 mg by mouth daily.)      No current facility-administered medications for this visit.    Allergies  Allergen Reactions   Clindamycin/Lincomycin     Burning sensastion   Amlodipine Other (See Comments) and Itching   Flonase [Fluticasone] Other (See Comments)    irratation   Penicillins Other (See Comments)    Has patient had a PCN reaction causing immediate rash, facial/tongue/throat swelling, SOB or lightheadedness with hypotension: Yes Has patient had a PCN reaction causing severe rash involving mucus membranes or skin necrosis: No Has patient had a PCN reaction that required hospitalization: No Has patient had a PCN reaction occurring within the last 10 years: Yes If all of the above answers are "NO", then may proceed with Cephalosporin use.   Hydrochlorothiazide Rash      Social History   Socioeconomic History   Marital status: Single    Spouse name: Not on file   Number of children: Not on file   Years of education: Not on file   Highest education level: Not on file  Occupational History   Not on file  Tobacco Use   Smoking status: Every Day    Current packs/day: 0.50    Types: Cigarettes   Smokeless tobacco: Never  Vaping Use   Vaping status: Never Used  Substance and Sexual Activity   Alcohol use: Not Currently    Comment: rarely   Drug use: No   Sexual activity: Not Currently  Other Topics Concern   Not on file  Social History Narrative   Not on file   Social Drivers of Health   Financial Resource Strain: Patient Declined (12/18/2022)   Received from [facility]   Overall Financial Resource Strain (CARDIA)    Difficulty of Paying Living Expenses: Patient declined  Food Insecurity: No Food Insecurity (12/08/2023)   Hunger Vital Sign    Worried About Running Out of Food in the Last Year: Never true    Ran Out of Food in the Last Year: Never true  Transportation Needs: No Transportation Needs (12/08/2023)   PRAPARE - Administrator, Civil Service  (Medical): No    Lack of Transportation (Non-Medical): No  Physical Activity: Not on file  Stress: Not on file  Social Connections: Not on file  Intimate Partner Violence: Not At Risk (12/08/2023)   Humiliation, Afraid, Rape, and Kick questionnaire    Fear of Current or Ex-Partner: No    Emotionally Abused: No    Physically Abused: No    Sexually Abused: No      Family History  Problem Relation Age of Onset   Heart failure Father    Heart attack Mother    There were no vitals filed for this visit.  Wt Readings from Last 3 Encounters:  12/18/23 154 lb 9.6 oz (70.1 kg)  12/11/23 158  lb 4.6 oz (71.8 kg)  11/13/22 160 lb (72.6 kg)   Lab Results  Component Value Date   CREATININE 0.88 12/10/2023   CREATININE 1.05 (H) 12/08/2023   CREATININE 1.03 (H) 12/07/2023    PHYSICAL EXAM:  General: Well appearing. No resp difficulty HEENT: normal Neck: supple, no JVD Cor: Regular rhythm, rate. No rubs, gallops or murmurs Lungs: clear Abdomen: soft, nontender, nondistended. Extremities: no cyanosis, clubbing, rash, edema Neuro: alert & oriented X 3. Moves all 4 extremities w/o difficulty. Affect pleasant      ECG: not done   ASSESSMENT & PLAN:  1: NICM with reduced ejection fraction- - suspect due to HTN - NYHA class II - euvolemic today - weighing daily and she was reminded to call for an overnight weight gain of >2 pounds or a weekly weight gain of >5 pounds - weight down 6 pounds from last visit here 1 year ago - Echo 07/21/22: EF of <20% with mild LVH, severe LAE and mild/moderate AR. - will get echo updated - not adding salt to her food and has been trying to read food labels for sodium content - continue carvedilol 12.5mg  BID - continue entresto 24/26mg  BID; titrate this at next visit - continue spironolactone 12.5mg  daily - continue torsemide 20mg  daily/ potassium BID - she is agreeable to trying farxiga 10mg  daily; if develops UTI/ yeast infection, let us or  PCP aware - BMET next visit - saw cardiology Mellissa Kohut) 02/24 - referral placed to [facility] cardiology - BNP 12/07/23 reviewed and was 1144.2  2: HTN- - BP 152/81 but hasn't taken medications yet today; encouraged to take meds before her appt to assess effectiveness of meds - saw PCP Larwance Sachs) 03/25 - BMP 12/10/23 reviewed and showed sodium 136, potassium 4.3, creatinine 0.88 and GFR >60 - BMET next visit  3: DM- - A1c 12/07/23 was 4.8% - saw vascular Gilda Crease) 05/21  4: IDA- - Hg 12/07/23 reviewed and was 15.3   Return in 3 weeks, sooner if needed.   Delma Freeze, FNP 01/07/24

## 2024-01-07 NOTE — Telephone Encounter (Incomplete)
 Called to confirm/remind patient of their appointment at the [facility] on 01/08/24***.   Appointment:   [] Confirmed  [x] Left mess   [] No answer/No voice mail  [] Phone not in service  Patient reminded to bring all medications and/or complete list.  Confirmed patient has transportation. Gave directions, instructed to utilize valet parking.

## 2024-01-08 ENCOUNTER — Ambulatory Visit: Attending: Family | Admitting: Family

## 2024-01-08 ENCOUNTER — Other Ambulatory Visit (HOSPITAL_COMMUNITY): Payer: Self-pay

## 2024-01-08 ENCOUNTER — Encounter: Payer: Self-pay | Admitting: Family

## 2024-01-08 VITALS — BP 136/84 | HR 95 | Wt 156.4 lb

## 2024-01-08 DIAGNOSIS — Z7984 Long term (current) use of oral hypoglycemic drugs: Secondary | ICD-10-CM | POA: Insufficient documentation

## 2024-01-08 DIAGNOSIS — I5022 Chronic systolic (congestive) heart failure: Secondary | ICD-10-CM | POA: Diagnosis not present

## 2024-01-08 DIAGNOSIS — I428 Other cardiomyopathies: Secondary | ICD-10-CM | POA: Insufficient documentation

## 2024-01-08 DIAGNOSIS — J449 Chronic obstructive pulmonary disease, unspecified: Secondary | ICD-10-CM | POA: Diagnosis not present

## 2024-01-08 DIAGNOSIS — E119 Type 2 diabetes mellitus without complications: Secondary | ICD-10-CM | POA: Diagnosis not present

## 2024-01-08 DIAGNOSIS — R0602 Shortness of breath: Secondary | ICD-10-CM | POA: Diagnosis present

## 2024-01-08 DIAGNOSIS — Z79899 Other long term (current) drug therapy: Secondary | ICD-10-CM | POA: Insufficient documentation

## 2024-01-08 DIAGNOSIS — E0500 Thyrotoxicosis with diffuse goiter without thyrotoxic crisis or storm: Secondary | ICD-10-CM | POA: Diagnosis not present

## 2024-01-08 DIAGNOSIS — E1122 Type 2 diabetes mellitus with diabetic chronic kidney disease: Secondary | ICD-10-CM | POA: Diagnosis not present

## 2024-01-08 DIAGNOSIS — F17210 Nicotine dependence, cigarettes, uncomplicated: Secondary | ICD-10-CM | POA: Insufficient documentation

## 2024-01-08 DIAGNOSIS — N189 Chronic kidney disease, unspecified: Secondary | ICD-10-CM | POA: Diagnosis not present

## 2024-01-08 DIAGNOSIS — I471 Supraventricular tachycardia, unspecified: Secondary | ICD-10-CM | POA: Diagnosis not present

## 2024-01-08 DIAGNOSIS — Z95810 Presence of automatic (implantable) cardiac defibrillator: Secondary | ICD-10-CM | POA: Diagnosis not present

## 2024-01-08 DIAGNOSIS — D508 Other iron deficiency anemias: Secondary | ICD-10-CM

## 2024-01-08 DIAGNOSIS — I130 Hypertensive heart and chronic kidney disease with heart failure and stage 1 through stage 4 chronic kidney disease, or unspecified chronic kidney disease: Secondary | ICD-10-CM | POA: Insufficient documentation

## 2024-01-08 DIAGNOSIS — I351 Nonrheumatic aortic (valve) insufficiency: Secondary | ICD-10-CM | POA: Insufficient documentation

## 2024-01-08 DIAGNOSIS — Z794 Long term (current) use of insulin: Secondary | ICD-10-CM

## 2024-01-08 DIAGNOSIS — I10 Essential (primary) hypertension: Secondary | ICD-10-CM

## 2024-01-08 MED ORDER — DAPAGLIFLOZIN PROPANEDIOL 10 MG PO TABS: 10.0000 mg | ORAL_TABLET | Freq: Every day | ORAL | Status: DC

## 2024-01-08 MED ORDER — DAPAGLIFLOZIN PROPANEDIOL 10 MG PO TABS
10.0000 mg | ORAL_TABLET | Freq: Every day | ORAL | Status: DC
Start: 2024-01-08 — End: 2024-01-08

## 2024-01-08 MED ORDER — DAPAGLIFLOZIN PROPANEDIOL 10 MG PO TABS: 10.0000 mg | ORAL_TABLET | Freq: Every day | ORAL | 6 refills | Status: DC

## 2024-01-08 NOTE — Progress Notes (Signed)
 [facility] - HEART FAILURE CLINIC - PHARMACIST COUNSELING NOTE  Adherence Assessment  Do you ever forget to take your medication? [] Yes [x] No  Do you ever skip doses due to side effects? [] Yes [x] No  Do you have trouble affording your medicines? [] Yes [x] No  Are you ever unable to pick up your medication due to transportation difficulties? [] Yes [x] No  Do you ever stop taking your medications because you don't believe they are helping? [] Yes [x] No  Do you check your weight daily? [x] Yes [] No  Do you check your blood pressure daily? [] Yes [x] No  Adherence strategy: Pill box   Barriers to obtaining medications:  None reported    Vital signs: HR 95, BP 136/84, weight 156 lbs ECHO: Date 07/21/2022, EF < 20%  Renal function: Date 12/10/2023, GFR > 60  Current Guideline-Directed Medical Therapy/Evidence Based Medicine  ACE/ARB/ARNI: Sacubitril-valsartan 24-26 mg twice daily Target dose: 97/103 mg twice daily  Beta Blocker: Carvedilol 12.5 mg twice daily Target dose: 25 mg twice daily   Aldosterone Antagonist: Spironolactone 12.5 mg daily Target dose: 50 mg daily   SGLT2i: Dapagliflozin 10 mg daily Target dose: On target dose   Diuretic: Torsemide 20 mg daily  ASSESSMENT 63 year old female who presents to the HF clinic for hospital follow-up. Patient was recently admitted to [facility] on 12/07/2023 with influenza and COPD exacerbation. PMH is significant for asthma, IDDM, HTN, thyroid disease, bell's palsy, COPD, anemia, SVT, Graves disease, anxiety, CKD, tobacco use and chronic heart failure. She has not taken any of her medications yet today. Marcelline Deist was started at last visit on 12/18/2023. She reports some shakiness, dizziness, and nervousness since starting it. Provided patient with glucose and BP log at today's visit and encouraged her to bring these with her to future appointments.   PLAN Continue current regimen as directed by NP Room to titrate Entresto,  carvedilol, and spironolactone if/when able  Prescriptions should be sent to either a [facility] or [facility] with her current insurance plan If sent anywhere else, will have to pay full price for meds   Time spent: 20 minutes   Littie Deeds, PharmD Pharmacy Resident  01/08/2024 8:15 AM

## 2024-01-08 NOTE — Patient Instructions (Signed)
 Medication Changes:  REFILLED Farxiga   Lab Work:  Go DOWN to LOWER LEVEL (LL) to have your blood work completed inside of [employer] office.   We will only call you if the results are abnormal or if the provider would like to make medication changes.   Follow-Up in: Please follow up with the [facility] in 2 months with Heather Kindred, NP.  At the [facility], you and your health needs are our priority. We have a designated team specialized in the treatment of Heart Failure. This Care Team includes your primary Heart Failure Specialized Cardiologist (physician), Advanced Practice Providers (APPs- Physician Assistants and Nurse Practitioners), and Pharmacist who all work together to provide you with the care you need, when you need it.   You may see any of the following providers on your designated Care Team at your next follow up:  Dr. Arvilla Meres Dr. Marca Ancona Dr. Dorthula Nettles Dr. Theresia Bough Heather Kindred, Heather Choi Enos Fling, RPH-CPP  Please be sure to bring in all your medications bottles to every appointment.   Need to Contact us:  If you have any questions or concerns before your next appointment please send [place] a message through [place] or call our office at [phone].    TO LEAVE A MESSAGE FOR THE NURSE SELECT OPTION 2, PLEASE LEAVE A MESSAGE INCLUDING: YOUR NAME DATE OF BIRTH CALL BACK NUMBER REASON FOR CALL**this is important as we prioritize the call backs  YOU WILL RECEIVE A CALL BACK THE SAME DAY AS LONG AS YOU CALL BEFORE 4:00 PM

## 2024-01-09 LAB — BASIC METABOLIC PANEL WITH GFR
BUN/Creatinine Ratio: 14 (ref 12–28)
BUN: 15 mg/dL (ref 8–27)
CO2: 26 mmol/L (ref 20–29)
Calcium: 8.8 mg/dL (ref 8.7–10.3)
Chloride: 106 mmol/L (ref 96–106)
Creatinine, Ser: 1.06 mg/dL — ABNORMAL HIGH (ref 0.57–1.00)
Glucose: 85 mg/dL (ref 70–99)
Potassium: 4.2 mmol/L (ref 3.5–5.2)
Sodium: 144 mmol/L (ref 134–144)
eGFR: 59 mL/min/{1.73_m2} — ABNORMAL LOW (ref >59)

## 2024-01-24 ENCOUNTER — Ambulatory Visit: Admission: RE | Admit: 2024-01-24 | Source: Ambulatory Visit

## 2024-02-14 ENCOUNTER — Ambulatory Visit
Admission: RE | Admit: 2024-02-14 | Discharge: 2024-02-14 | Disposition: A | Discharge status: Home / Self Care | Source: Ambulatory Visit | Attending: Internal Medicine | Admitting: Internal Medicine

## 2024-02-14 DIAGNOSIS — I5022 Chronic systolic (congestive) heart failure: Secondary | ICD-10-CM | POA: Insufficient documentation

## 2024-02-14 DIAGNOSIS — Z950 Presence of cardiac pacemaker: Secondary | ICD-10-CM | POA: Insufficient documentation

## 2024-02-14 MED ORDER — FUROSEMIDE 10 MG/ML IJ SOLN
80.0000 mg | Freq: Once | INTRAMUSCULAR | Status: AC
  Administered 2024-02-14: 80 mg via INTRAVENOUS

## 2024-02-14 MED ORDER — FUROSEMIDE 10 MG/ML IJ SOLN
INTRAMUSCULAR | Status: AC
  Filled 2024-02-14: qty 8

## 2024-02-20 ENCOUNTER — Encounter: Admitting: Family

## 2024-03-04 ENCOUNTER — Other Ambulatory Visit: Payer: Self-pay | Admitting: Internal Medicine

## 2024-03-04 DIAGNOSIS — I5022 Chronic systolic (congestive) heart failure: Secondary | ICD-10-CM

## 2024-03-04 DIAGNOSIS — M7989 Other specified soft tissue disorders: Secondary | ICD-10-CM

## 2024-03-04 DIAGNOSIS — Z950 Presence of cardiac pacemaker: Secondary | ICD-10-CM

## 2024-03-10 ENCOUNTER — Ambulatory Visit
Admission: RE | Admit: 2024-03-10 | Discharge: 2024-03-10 | Disposition: A | Discharge status: Home / Self Care | Source: Ambulatory Visit | Attending: Internal Medicine | Admitting: Internal Medicine

## 2024-03-10 ENCOUNTER — Encounter: Admitting: Family

## 2024-03-10 DIAGNOSIS — Z950 Presence of cardiac pacemaker: Secondary | ICD-10-CM | POA: Insufficient documentation

## 2024-03-10 DIAGNOSIS — I5022 Chronic systolic (congestive) heart failure: Secondary | ICD-10-CM | POA: Insufficient documentation

## 2024-03-10 DIAGNOSIS — M7989 Other specified soft tissue disorders: Secondary | ICD-10-CM | POA: Insufficient documentation

## 2024-03-10 DIAGNOSIS — M79662 Pain in left lower leg: Secondary | ICD-10-CM | POA: Diagnosis present

## 2024-07-07 ENCOUNTER — Encounter: Payer: Self-pay | Admitting: *Deleted

## 2024-07-07 ENCOUNTER — Emergency Department

## 2024-07-07 ENCOUNTER — Other Ambulatory Visit: Payer: Self-pay

## 2024-07-07 DIAGNOSIS — Z7982 Long term (current) use of aspirin: Secondary | ICD-10-CM

## 2024-07-07 DIAGNOSIS — D696 Thrombocytopenia, unspecified: Secondary | ICD-10-CM | POA: Diagnosis present

## 2024-07-07 DIAGNOSIS — G510 Bell's palsy: Secondary | ICD-10-CM | POA: Diagnosis present

## 2024-07-07 DIAGNOSIS — J9601 Acute respiratory failure with hypoxia: Secondary | ICD-10-CM | POA: Diagnosis present

## 2024-07-07 DIAGNOSIS — E890 Postprocedural hypothyroidism: Secondary | ICD-10-CM | POA: Diagnosis present

## 2024-07-07 DIAGNOSIS — I5023 Acute on chronic systolic (congestive) heart failure: Secondary | ICD-10-CM | POA: Diagnosis present

## 2024-07-07 DIAGNOSIS — Z79890 Hormone replacement therapy (postmenopausal): Secondary | ICD-10-CM

## 2024-07-07 DIAGNOSIS — I130 Hypertensive heart and chronic kidney disease with heart failure and stage 1 through stage 4 chronic kidney disease, or unspecified chronic kidney disease: Secondary | ICD-10-CM | POA: Diagnosis not present

## 2024-07-07 DIAGNOSIS — Z888 Allergy status to other drugs, medicaments and biological substances status: Secondary | ICD-10-CM

## 2024-07-07 DIAGNOSIS — Z8249 Family history of ischemic heart disease and other diseases of the circulatory system: Secondary | ICD-10-CM

## 2024-07-07 DIAGNOSIS — Z794 Long term (current) use of insulin: Secondary | ICD-10-CM

## 2024-07-07 DIAGNOSIS — J4489 Other specified chronic obstructive pulmonary disease: Secondary | ICD-10-CM | POA: Diagnosis present

## 2024-07-07 DIAGNOSIS — E119 Type 2 diabetes mellitus without complications: Secondary | ICD-10-CM | POA: Diagnosis present

## 2024-07-07 DIAGNOSIS — Z95810 Presence of automatic (implantable) cardiac defibrillator: Secondary | ICD-10-CM

## 2024-07-07 DIAGNOSIS — I428 Other cardiomyopathies: Secondary | ICD-10-CM | POA: Diagnosis present

## 2024-07-07 DIAGNOSIS — Z90710 Acquired absence of both cervix and uterus: Secondary | ICD-10-CM

## 2024-07-07 DIAGNOSIS — Z880 Allergy status to penicillin: Secondary | ICD-10-CM

## 2024-07-07 DIAGNOSIS — Z1152 Encounter for screening for COVID-19: Secondary | ICD-10-CM

## 2024-07-07 DIAGNOSIS — R252 Cramp and spasm: Secondary | ICD-10-CM | POA: Diagnosis present

## 2024-07-07 DIAGNOSIS — Z881 Allergy status to other antibiotic agents status: Secondary | ICD-10-CM

## 2024-07-07 DIAGNOSIS — E1122 Type 2 diabetes mellitus with diabetic chronic kidney disease: Secondary | ICD-10-CM | POA: Diagnosis present

## 2024-07-07 DIAGNOSIS — F17210 Nicotine dependence, cigarettes, uncomplicated: Secondary | ICD-10-CM | POA: Diagnosis present

## 2024-07-07 DIAGNOSIS — I472 Ventricular tachycardia, unspecified: Secondary | ICD-10-CM | POA: Diagnosis not present

## 2024-07-07 DIAGNOSIS — N189 Chronic kidney disease, unspecified: Secondary | ICD-10-CM | POA: Diagnosis present

## 2024-07-07 DIAGNOSIS — Z79899 Other long term (current) drug therapy: Secondary | ICD-10-CM

## 2024-07-07 LAB — CBC
HCT: 48.6 % — ABNORMAL HIGH (ref 36.0–46.0)
Hemoglobin: 15.4 g/dL — ABNORMAL HIGH (ref 12.0–15.0)
MCH: 28.6 pg (ref 26.0–34.0)
MCHC: 31.7 g/dL (ref 30.0–36.0)
MCV: 90.3 fL (ref 80.0–100.0)
Platelets: 119 K/uL — ABNORMAL LOW (ref 150–400)
RBC: 5.38 MIL/uL — ABNORMAL HIGH (ref 3.87–5.11)
RDW: 15.7 % — ABNORMAL HIGH (ref 11.5–15.5)
WBC: 4.8 K/uL (ref 4.0–10.5)
nRBC: 0 % (ref 0.0–0.2)

## 2024-07-07 LAB — BASIC METABOLIC PANEL WITH GFR
Anion gap: 9 (ref 5–15)
BUN: 17 mg/dL (ref 8–23)
CO2: 28 mmol/L (ref 22–32)
Calcium: 8.8 mg/dL — ABNORMAL LOW (ref 8.9–10.3)
Chloride: 104 mmol/L (ref 98–111)
Creatinine, Ser: 1.01 mg/dL — ABNORMAL HIGH (ref 0.44–1.00)
GFR, Estimated: >60 mL/min (ref >60)
Glucose, Bld: 109 mg/dL — ABNORMAL HIGH (ref 70–99)
Potassium: 4.4 mmol/L (ref 3.5–5.1)
Sodium: 141 mmol/L (ref 135–145)

## 2024-07-07 LAB — TROPONIN I (HIGH SENSITIVITY)
Troponin I (High Sensitivity): 50 ng/L — ABNORMAL HIGH (ref <18)
Troponin I (High Sensitivity): 50 ng/L — ABNORMAL HIGH (ref <18)

## 2024-07-07 NOTE — ED Triage Notes (Signed)
 Pt ambulatory to triage.  Pt has sob.  Pt states heaviness in chest.  Pt has nausea.  Sx for 1 week.   Pt reports sob worse with ambulation  pt alert.

## 2024-07-07 NOTE — ED Notes (Signed)
 Pt to STAT desk to inquire over wait time; pt ambulatory without difficulty or distress noted; pt updated on wait and plan of care; vs retaken and stable

## 2024-07-08 ENCOUNTER — Inpatient Hospital Stay
Admission: EM | Admit: 2024-07-08 | Discharge: 2024-07-11 | DRG: 291 | Disposition: A | Discharge status: Home / Self Care | Attending: Internal Medicine | Admitting: Internal Medicine

## 2024-07-08 ENCOUNTER — Inpatient Hospital Stay
Admit: 2024-07-08 | Discharge: 2024-07-08 | Disposition: A | Discharge status: Home / Self Care | Attending: Internal Medicine | Admitting: Internal Medicine

## 2024-07-08 DIAGNOSIS — Z95810 Presence of automatic (implantable) cardiac defibrillator: Secondary | ICD-10-CM | POA: Diagnosis not present

## 2024-07-08 DIAGNOSIS — F17210 Nicotine dependence, cigarettes, uncomplicated: Secondary | ICD-10-CM | POA: Diagnosis present

## 2024-07-08 DIAGNOSIS — Z7982 Long term (current) use of aspirin: Secondary | ICD-10-CM | POA: Diagnosis not present

## 2024-07-08 DIAGNOSIS — E119 Type 2 diabetes mellitus without complications: Secondary | ICD-10-CM

## 2024-07-08 DIAGNOSIS — Z794 Long term (current) use of insulin: Secondary | ICD-10-CM | POA: Diagnosis not present

## 2024-07-08 DIAGNOSIS — R252 Cramp and spasm: Secondary | ICD-10-CM

## 2024-07-08 DIAGNOSIS — Z90710 Acquired absence of both cervix and uterus: Secondary | ICD-10-CM | POA: Diagnosis not present

## 2024-07-08 DIAGNOSIS — I130 Hypertensive heart and chronic kidney disease with heart failure and stage 1 through stage 4 chronic kidney disease, or unspecified chronic kidney disease: Secondary | ICD-10-CM | POA: Diagnosis present

## 2024-07-08 DIAGNOSIS — I5023 Acute on chronic systolic (congestive) heart failure: Secondary | ICD-10-CM | POA: Diagnosis present

## 2024-07-08 DIAGNOSIS — I428 Other cardiomyopathies: Secondary | ICD-10-CM | POA: Diagnosis present

## 2024-07-08 DIAGNOSIS — E1122 Type 2 diabetes mellitus with diabetic chronic kidney disease: Secondary | ICD-10-CM | POA: Diagnosis present

## 2024-07-08 DIAGNOSIS — Z880 Allergy status to penicillin: Secondary | ICD-10-CM | POA: Diagnosis not present

## 2024-07-08 DIAGNOSIS — I472 Ventricular tachycardia, unspecified: Secondary | ICD-10-CM | POA: Diagnosis not present

## 2024-07-08 DIAGNOSIS — J9601 Acute respiratory failure with hypoxia: Secondary | ICD-10-CM | POA: Diagnosis present

## 2024-07-08 DIAGNOSIS — Z881 Allergy status to other antibiotic agents status: Secondary | ICD-10-CM | POA: Diagnosis not present

## 2024-07-08 DIAGNOSIS — Z1152 Encounter for screening for COVID-19: Secondary | ICD-10-CM | POA: Diagnosis not present

## 2024-07-08 DIAGNOSIS — I10 Essential (primary) hypertension: Secondary | ICD-10-CM | POA: Diagnosis present

## 2024-07-08 DIAGNOSIS — R0609 Other forms of dyspnea: Principal | ICD-10-CM

## 2024-07-08 DIAGNOSIS — J4489 Other specified chronic obstructive pulmonary disease: Secondary | ICD-10-CM | POA: Diagnosis present

## 2024-07-08 DIAGNOSIS — N189 Chronic kidney disease, unspecified: Secondary | ICD-10-CM | POA: Diagnosis present

## 2024-07-08 DIAGNOSIS — D696 Thrombocytopenia, unspecified: Secondary | ICD-10-CM | POA: Diagnosis present

## 2024-07-08 DIAGNOSIS — J449 Chronic obstructive pulmonary disease, unspecified: Secondary | ICD-10-CM | POA: Diagnosis present

## 2024-07-08 DIAGNOSIS — Z79890 Hormone replacement therapy (postmenopausal): Secondary | ICD-10-CM | POA: Diagnosis not present

## 2024-07-08 DIAGNOSIS — Z79899 Other long term (current) drug therapy: Secondary | ICD-10-CM | POA: Diagnosis not present

## 2024-07-08 DIAGNOSIS — E890 Postprocedural hypothyroidism: Secondary | ICD-10-CM | POA: Diagnosis present

## 2024-07-08 DIAGNOSIS — I509 Heart failure, unspecified: Secondary | ICD-10-CM

## 2024-07-08 DIAGNOSIS — Z8249 Family history of ischemic heart disease and other diseases of the circulatory system: Secondary | ICD-10-CM | POA: Diagnosis not present

## 2024-07-08 DIAGNOSIS — Z888 Allergy status to other drugs, medicaments and biological substances status: Secondary | ICD-10-CM | POA: Diagnosis not present

## 2024-07-08 LAB — CBG MONITORING, ED
Glucose-Capillary: 95 mg/dL (ref 70–99)
Glucose-Capillary: 171 mg/dL — ABNORMAL HIGH (ref 70–99)
Glucose-Capillary: 111 mg/dL — ABNORMAL HIGH (ref 70–99)

## 2024-07-08 LAB — BASIC METABOLIC PANEL WITH GFR
Anion gap: 10 (ref 5–15)
BUN: 16 mg/dL (ref 8–23)
CO2: 29 mmol/L (ref 22–32)
Calcium: 8.7 mg/dL — ABNORMAL LOW (ref 8.9–10.3)
Chloride: 100 mmol/L (ref 98–111)
Creatinine, Ser: 1.04 mg/dL — ABNORMAL HIGH (ref 0.44–1.00)
GFR, Estimated: >60 mL/min (ref >60)
Glucose, Bld: 97 mg/dL (ref 70–99)
Potassium: 4 mmol/L (ref 3.5–5.1)
Sodium: 139 mmol/L (ref 135–145)

## 2024-07-08 LAB — TSH: TSH: 0.971 u[IU]/mL (ref 0.350–4.500)

## 2024-07-08 LAB — ECHOCARDIOGRAM COMPLETE
AR max vel: 1.67 cm2
AV Area VTI: 1.28 cm2
AV Area mean vel: 1.56 cm2
AV Mean grad: 3 mmHg
AV Peak grad: 6.2 mmHg
Ao pk vel: 1.24 m/s
Area-P 1/2: 5.13 cm2
Calc EF: 18.5 %
Est EF: <20
MV M vel: 4.46 m/s
MV Peak grad: 79.6 mmHg
MV VTI: 0.9 cm2
P 1/2 time: 486 ms
Radius: 0.7 cm
S' Lateral: 5.8 cm
Single Plane A2C EF: 10.7 %
Single Plane A4C EF: 21 %

## 2024-07-08 LAB — T4, FREE: Free T4: 1.28 ng/dL — ABNORMAL HIGH (ref 0.61–1.12)

## 2024-07-08 LAB — BRAIN NATRIURETIC PEPTIDE: B Natriuretic Peptide: 1414.9 pg/mL — ABNORMAL HIGH (ref 0.0–100.0)

## 2024-07-08 LAB — GLUCOSE, CAPILLARY: Glucose-Capillary: 130 mg/dL — ABNORMAL HIGH (ref 70–99)

## 2024-07-08 MED ORDER — INSULIN ASPART 100 UNIT/ML IJ SOLN: 0.0000 [IU] | Freq: Every day | INTRAMUSCULAR | Status: DC

## 2024-07-08 MED ORDER — ACETAMINOPHEN 325 MG PO TABS: 650.0000 mg | ORAL_TABLET | Freq: Four times a day (QID) | ORAL | Status: DC | PRN

## 2024-07-08 MED ORDER — LEVOTHYROXINE SODIUM 137 MCG PO TABS: 137.0000 ug | ORAL_TABLET | Freq: Every day | ORAL | Status: DC

## 2024-07-08 MED ORDER — LEVOTHYROXINE SODIUM 25 MCG PO TABS
125.0000 ug | ORAL_TABLET | Freq: Every day | ORAL | Status: DC
  Administered 2024-07-08 – 2024-07-11 (×4): 125 ug via ORAL
  Filled 2024-07-08 (×4): qty 1

## 2024-07-08 MED ORDER — FUROSEMIDE 10 MG/ML IJ SOLN
40.0000 mg | Freq: Two times a day (BID) | INTRAMUSCULAR | Status: DC
  Administered 2024-07-08 – 2024-07-10 (×5): 40 mg via INTRAVENOUS
  Filled 2024-07-08 (×5): qty 4

## 2024-07-08 MED ORDER — ONDANSETRON HCL 4 MG PO TABS: 4.0000 mg | ORAL_TABLET | Freq: Four times a day (QID) | ORAL | Status: DC | PRN

## 2024-07-08 MED ORDER — ALPRAZOLAM 0.5 MG PO TABS: 0.5000 mg | ORAL_TABLET | Freq: Every day | ORAL | Status: DC | PRN

## 2024-07-08 MED ORDER — ASPIRIN 81 MG PO TBEC
81.0000 mg | DELAYED_RELEASE_TABLET | Freq: Every day | ORAL | Status: DC
  Administered 2024-07-08 – 2024-07-11 (×4): 81 mg via ORAL
  Filled 2024-07-08 (×4): qty 1

## 2024-07-08 MED ORDER — SPIRONOLACTONE 12.5 MG HALF TABLET
12.5000 mg | ORAL_TABLET | Freq: Every day | ORAL | Status: DC
Start: 2024-07-08 — End: 2024-07-10
  Administered 2024-07-08 – 2024-07-09 (×2): 12.5 mg via ORAL
  Filled 2024-07-08 (×3): qty 1

## 2024-07-08 MED ORDER — ALBUTEROL SULFATE (2.5 MG/3ML) 0.083% IN NEBU: 2.5000 mg | INHALATION_SOLUTION | Freq: Four times a day (QID) | RESPIRATORY_TRACT | Status: DC | PRN

## 2024-07-08 MED ORDER — DAPAGLIFLOZIN PROPANEDIOL 10 MG PO TABS
10.0000 mg | ORAL_TABLET | Freq: Every day | ORAL | Status: DC
Start: 2024-07-08 — End: 2024-07-11
  Filled 2024-07-08 (×4): qty 1

## 2024-07-08 MED ORDER — LIDOCAINE 5 % EX PTCH
1.0000 | MEDICATED_PATCH | CUTANEOUS | Status: DC
  Administered 2024-07-08 – 2024-07-11 (×4): 1 via TRANSDERMAL
  Filled 2024-07-08 (×4): qty 1

## 2024-07-08 MED ORDER — FUROSEMIDE 10 MG/ML IJ SOLN
40.0000 mg | Freq: Once | INTRAMUSCULAR | Status: AC
  Administered 2024-07-08: 40 mg via INTRAVENOUS
  Filled 2024-07-08: qty 4

## 2024-07-08 MED ORDER — HYDROCODONE-ACETAMINOPHEN 5-325 MG PO TABS: 1.0000 | ORAL_TABLET | ORAL | Status: DC | PRN

## 2024-07-08 MED ORDER — INSULIN ASPART 100 UNIT/ML IJ SOLN
0.0000 [IU] | Freq: Three times a day (TID) | INTRAMUSCULAR | Status: DC
  Administered 2024-07-08: 3 [IU] via SUBCUTANEOUS
  Administered 2024-07-10 – 2024-07-11 (×3): 2 [IU] via SUBCUTANEOUS
  Filled 2024-07-08 (×4): qty 1

## 2024-07-08 MED ORDER — ACETAMINOPHEN 650 MG RE SUPP: 650.0000 mg | Freq: Four times a day (QID) | RECTAL | Status: DC | PRN

## 2024-07-08 MED ORDER — ENOXAPARIN SODIUM 40 MG/0.4ML IJ SOSY
40.0000 mg | PREFILLED_SYRINGE | INTRAMUSCULAR | Status: DC
  Administered 2024-07-08 – 2024-07-11 (×4): 40 mg via SUBCUTANEOUS
  Filled 2024-07-08 (×4): qty 0.4

## 2024-07-08 MED ORDER — CARVEDILOL 12.5 MG PO TABS
12.5000 mg | ORAL_TABLET | Freq: Two times a day (BID) | ORAL | Status: DC
Start: 2024-07-08 — End: 2024-07-10
  Administered 2024-07-08 – 2024-07-10 (×4): 12.5 mg via ORAL
  Filled 2024-07-08 (×2): qty 1
  Filled 2024-07-08: qty 2
  Filled 2024-07-08: qty 1

## 2024-07-08 MED ORDER — ONDANSETRON HCL 4 MG/2ML IJ SOLN: 4.0000 mg | Freq: Four times a day (QID) | INTRAMUSCULAR | Status: DC | PRN

## 2024-07-08 MED ORDER — SACUBITRIL-VALSARTAN 24-26 MG PO TABS
1.0000 | ORAL_TABLET | Freq: Two times a day (BID) | ORAL | Status: DC
  Administered 2024-07-08 – 2024-07-11 (×7): 1 via ORAL
  Filled 2024-07-08 (×8): qty 1

## 2024-07-08 NOTE — Assessment & Plan Note (Addendum)
 S/p AICD Acute respiratory failure with hypoxia IV Lasix  Continue home GDMT Echocardiogram Daily weights with intake and output monitoring Supplemental oxygen and wean as tolerated Cardiology consult

## 2024-07-08 NOTE — Plan of Care (Signed)
 Patient admitted for acute on chronic HFrEF, acute hypoxic respiratory failure Seen by cardiology, on IV Lasix  GDMT as tolerated Wean O2 as able   Acquired hypothyroidism Graves' disease s/p subtotal thyroidectomy TSH low, FT4 Decrease levothyroxine  dose to 125 mcg daily  Rest of the details as per H&P today

## 2024-07-08 NOTE — ED Notes (Addendum)
 Assumed care from [facility]. Introduced self to pt. Call bell in reach. Pt has no further needs at this time.

## 2024-07-08 NOTE — Consult Note (Signed)
 [facility] CLINIC CARDIOLOGY CONSULT NOTE       Patient ID: Heather Choi MRN: [id number] DOB/AGE: [birth year] 63 y.o.  Admit date: 07/08/2024 Referring Physician Dr. Delayne Solian Primary Physician Diedra Lame, MD  Primary Cardiologist Dr. Annalee Casa Reason for Consultation acute heart failure  HPI: Heather Choi is a 63 y.o. 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 Cardiology was consulted for further evaluation.   Patient reports that over the last 2 weeks she has been having issues with worsening shortness of breath.  States that given that her symptoms gradually worsen she decided to come to the ED for further evaluation.  Workup in the ED notable for creatinine 1.01, potassium 4.4, hemoglobin 15.4, WBC 4.8. Troponins 50 > 50, BNP 1414. EKG in the ED sinus rhythm rate 85 bpm. CXR with cardiomegaly but no evidence of pulmonary edema.  On IV Lasix  in the ED.  At the time my evaluation this morning patient is resting comfortably in hospital bed.  We discussed her symptoms in further detail.  She endorses worsening shortness of breath over the last 2 weeks with associated dyspnea on exertion and orthopnea.  States she has orthopnea at baseline.  Also endorses some worsening swelling in her lower extremities which has improved since she arrived to the ED and has gotten IV Lasix .  She endorses good urine output.  She denies any chest pain or palpitation symptoms.  Overall feeling better today.  Review of systems complete and found to be negative unless listed above    Past  Medical History:  Diagnosis Date   Asthma    Bell's palsy    fingers tingling in cold weather   CHF (congestive heart failure) (HCC)    COPD (chronic obstructive pulmonary disease) (HCC)    Diabetes mellitus without complication (HCC)    Enlarged heart    Hypertension    Thyroid  disease     Past Surgical History:  Procedure Laterality Date   ABDOMINAL HYSTERECTOMY     CHOLECYSTECTOMY     IMPLANTABLE CARDIOVERTER DEFIBRILLATOR IMPLANT     laparoscopic bilateral oophorectomy with removal of adnexal mass and lysis of adhesions  11/20/2017   LAPAROSCOPY     with removal of adnexal structure    (Not in a hospital admission)  Social History   Socioeconomic History   Marital status: Single    Spouse name: Not on file   Number of children: Not on file   Years of education: Not on file   Highest education level: Not on file  Occupational History   Not on file  Tobacco Use   Smoking status: Every Day    Current packs/day: 0.50    Types: Cigarettes   Smokeless tobacco: Never  Vaping Use   Vaping status: Never Used  Substance and Sexual Activity   Alcohol use: Not Currently    Comment: rarely   Drug use: No   Sexual activity: Not Currently  Other Topics Concern   Not on file  Social History Narrative   Not on file   Social Drivers of Health   Financial Resource Strain: Patient Declined (03/17/2024)   Received from  [facility]   Overall Financial Resource Strain (CARDIA)    Difficulty of Paying Living Expenses: Patient declined  Food Insecurity: Patient Declined (03/17/2024)   Received from [facility]   Hunger Vital Sign    Within the past 12 months, you worried that your food would run out before you got the money to buy more.: Patient declined    Within the past 12 months, the food you bought just didn't last and you didn't have money to get more.: Patient declined  Transportation Needs: Patient Declined (03/17/2024)   Received from  [facility] - Transportation    In the past 12 months, has lack of transportation kept you from medical appointments or from getting medications?: Patient declined    Lack of Transportation (Non-Medical): Patient declined  Physical Activity: Not on file  Stress: Not on file  Social Connections: Not on file  Intimate Partner Violence: Not At Risk (12/08/2023)   Humiliation, Afraid, Rape, and Kick questionnaire    Fear of Current or Ex-Partner: No    Emotionally Abused: No    Physically Abused: No    Sexually Abused: No    Family History  Problem Relation Age of Onset   Heart failure Father    Heart attack Mother      Vitals:   07/08/24 0530 07/08/24 0630 07/08/24 0730 07/08/24 0739  BP: (!) 112/56 110/65 118/68   Pulse: 82 82 94   Resp: (!) 22 (!) 21 (!) 25   Temp:    98.5 F (36.9 C)  TempSrc:    Oral  SpO2: 97% 98% 96%   Weight:      Height:        PHYSICAL EXAM General: Chronically ill-appearing female, well nourished, in no acute distress. HEENT: Normocephalic and atraumatic. Neck: No JVD.  Lungs: Normal respiratory effort on 2L [place]. Clear bilaterally to auscultation. No wheezes, crackles, rhonchi.  Heart: HRRR. Normal S1 and S2 without gallops or murmurs.  Abdomen: Non-distended appearing.  Msk: Normal strength and tone for age. Extremities: Warm and well perfused. No clubbing, cyanosis.  Trace edema.  Neuro: Alert and oriented X 3. Psych: Answers questions appropriately.   Labs: Basic Metabolic Panel: Recent Labs    07/07/24 1848  NA 141  K 4.4  CL 104  CO2 28  GLUCOSE 109*  BUN 17  CREATININE 1.01*  CALCIUM 8.8*   Liver Function Tests: No results for input(s): AST, ALT, ALKPHOS, BILITOT, PROT, ALBUMIN in the last 72 hours. No results for input(s): LIPASE, AMYLASE in the last 72 hours. CBC: Recent Labs    07/07/24 1848  WBC 4.8  HGB 15.4*  HCT 48.6*  MCV 90.3  PLT 119*   Cardiac Enzymes: Recent Labs     07/07/24 1848 07/07/24 2034  TROPONINIHS 50* 50*   BNP: Recent Labs    07/07/24 1848  BNP 1,414.9*   D-Dimer: No results for input(s): DDIMER in the last 72 hours. Hemoglobin A1C: No results for input(s): HGBA1C in the last 72 hours. Fasting Lipid Panel: No results for input(s): CHOL, HDL, LDLCALC, TRIG, CHOLHDL, LDLDIRECT in the last 72 hours. Thyroid  Function Tests: Recent Labs    07/07/24 1849  TSH 0.971   Anemia Panel: No results for input(s): VITAMINB12, FOLATE, FERRITIN, TIBC, IRON, RETICCTPCT in the last 72 hours.   Radiology: DG Chest 2 View Result Date: 07/07/2024 CLINICAL DATA:  sob EXAM: CHEST - 2 VIEW COMPARISON:  December 10, 2023  FINDINGS: Coarsening of the pulmonary interstitium, possibly due to underlying emphysema. No focal airspace consolidation, pleural effusion, or pneumothorax. Moderate cardiomegaly. Left chest pacemaker/AICD with leads terminating in the right atrium and right ventricle. Tortuous aorta with aortic atherosclerosis. No acute fracture or destructive lesions. Multilevel thoracic osteophytosis. Cholecystectomy clips. IMPRESSION: Moderate cardiomegaly.  No pneumonia or pulmonary edema. Electronically Signed   By: Rogelia Myers M.D.   On: 07/07/2024 19:37    ECHO ordered  TELEMETRY reviewed by me 07/08/2024: Off telemetry  EKG reviewed by me: sinus rhythm rate 85 bpm  Data reviewed by me 07/08/2024: last 24h vitals tele labs imaging I/O ED provider note, admission H&P  Principal Problem:   Acute on chronic HFrEF (heart failure with reduced ejection fraction) (HCC) Active Problems:   COPD (chronic obstructive pulmonary disease) (HCC)   Benign essential hypertension   Insulin  dependent type 2 diabetes mellitus (HCC)   Acute respiratory failure with hypoxia (HCC)   Acute exacerbation of CHF (congestive heart failure) (HCC)    ASSESSMENT AND PLAN:  Heather Choi is a 63 y.o. 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 Cardiology was consulted for further evaluation.   # Acute on chronic HFrEF # Non-ischemic cardiomyopathy # S/p ICD # Hypertension Patient presented with 2 weeks of worsening shortness of breath and lower extremity edema.  BNP elevated at 1500.  Troponins mild and flat at 50 > 50.  Started on IV Lasix . - Echo ordered. - Continue IV Lasix  40 mg twice daily.  Patient endorses good urine output. - Continue propranolol 12.5 mg daily (consider decreasing dose if concern for shock), Entresto  24-26 mg twice daily, spironolactone  12.5 mg daily.  Likely plan for uptitration of spironolactone  tomorrow.  Patient previously prescribed Farxiga  but reported she was not taking this at home due to dizziness/shakiness. - Continue aspirin  81 mg daily. - Minimally elevated and flat trending troponin most consistent with demand/supply mismatch and not ACS in the setting of acute heart failure.  No plan for further cardiac diagnostics.   This patient's plan of care was discussed and created with Dr. Ammon and he is in agreement.  Signed: Danita Bloch, PA-C  07/08/2024, 11:03 AM [facility]

## 2024-07-08 NOTE — H&P (Addendum)
 History and Physical    Patient: Heather Choi FMW:[id number] DOB: Feb 08, 1961 DOA: 07/08/2024 DOS: the patient was seen and examined on 07/08/2024 PCP: Diedra Lame, MD  Patient coming from: Home  Chief Complaint:  Chief Complaint  Patient presents with   Shortness of Breath    HPI: Heather Choi is a 63 y.o. female with medical history significant for COPD, chronic HFrEF with LVEF less than 20% s/p AICD, HTN, IDDM being admitted with acute CHF exacerbation and acute hypoxic respiratory failure requiring 2 L.  She has chronic dyspnea on exertion which worsened over the past few days, associated with worsening lower extremity edema and orthopnea.  She has chest discomfort with exertion.  Denies leg pain.  Has no cough, fever or chills. In the ED, tachypneic to 22 with otherwise normal vital signs with O2 sat initially in the mid 90s on room air.  Labs notable for troponin 50-50 and BNP 1414.  Labs otherwise notable for mild thrombocytopenia of 119 and mildly elevated creatinine of 1.01 from baseline of 0.88. EKG showed NSR at 85 with no ischemic changes and chest x-ray showed moderate cardiomegaly without pneumonia or pulmonary edema. Patient treated with IV Lasix . While attempting to ambulate patient in preparation for possible discharge, O2 sat dropped to 79% on room air.  Patient subsequently placed on O2 at 2 L with improvement to the 90s. Admission requested.     Review of Systems: As mentioned in the history of present illness. All other systems reviewed and are negative.  Past Medical History:  Diagnosis Date   Asthma    Bell's palsy    fingers tingling in cold weather   CHF (congestive heart failure) (HCC)    COPD (chronic obstructive pulmonary disease) (HCC)    Diabetes mellitus without complication (HCC)    Enlarged heart    Hypertension    Thyroid  disease    Past Surgical History:  Procedure Laterality Date   ABDOMINAL HYSTERECTOMY     CHOLECYSTECTOMY      IMPLANTABLE CARDIOVERTER DEFIBRILLATOR IMPLANT     laparoscopic bilateral oophorectomy with removal of adnexal mass and lysis of adhesions  11/20/2017   LAPAROSCOPY     with removal of adnexal structure   Social History:  reports that she has been smoking cigarettes. She has never used smokeless tobacco. She reports that she does not currently use alcohol. She reports that she does not use drugs.  Allergies  Allergen Reactions   Clindamycin /Lincomycin     Burning sensastion   Amlodipine Other (See Comments) and Itching   Flonase  [Fluticasone ] Other (See Comments)    irratation   Penicillins Other (See Comments)    Has patient had a PCN reaction causing immediate rash, facial/tongue/throat swelling, SOB or lightheadedness with hypotension: Yes Has patient had a PCN reaction causing severe rash involving mucus membranes or skin necrosis: No Has patient had a PCN reaction that required hospitalization: No Has patient had a PCN reaction occurring within the last 10 years: Yes If all of the above answers are NO, then may proceed with Cephalosporin use.   Hydrochlorothiazide Rash    Family History  Problem Relation Age of Onset   Heart failure Father    Heart attack Mother     Prior to Admission medications   Medication Sig Start Date End Date Taking? Authorizing Provider  albuterol  (VENTOLIN  HFA) 108 (90 Base) MCG/ACT inhaler Inhale 2 puffs into the lungs every 6 (six) hours as needed for wheezing or shortness of breath.  07/24/22   Regalado, Belkys A, MD  ALPRAZolam  (XANAX ) 0.5 MG tablet Take 0.5 mg by mouth daily as needed for sleep.    [provider]  aspirin  EC 81 MG tablet Take 81 mg by mouth daily.     [provider]  Aspirin -Caffeine (BC ON THE GO) 845-65 MG PACK Take 1 Package by mouth daily.    [provider]  carvedilol  (COREG ) 12.5 MG tablet Take 12.5 mg by mouth 2 (two) times daily with a meal.    [provider]  cetirizine (ZYRTEC)  10 MG tablet Take 10 mg by mouth daily.    [provider]  dapagliflozin  propanediol (FARXIGA ) 10 MG TABS tablet Take 1 tablet (10 mg total) by mouth daily before breakfast. 01/08/24   Donette Ellouise LABOR, FNP  guaiFENesin  (MUCINEX ) 600 MG 12 hr tablet Take 1 tablet (600 mg total) by mouth 2 (two) times daily. 07/23/22   Regalado, Belkys A, MD  levothyroxine  (SYNTHROID ) 137 MCG tablet Take 137 mcg by mouth daily. 03/05/14   [provider]  Liniments (SALONPAS PAIN RELIEF PATCH EX) Apply 1 patch topically daily as needed (pain).    [provider]  montelukast  (SINGULAIR ) 10 MG tablet Take 1 tablet by mouth at bedtime. 07/30/23 07/29/24  [provider]  Multiple Vitamin (MULTIVITAMIN WITH MINERALS) TABS tablet Take 1 tablet by mouth daily. 07/24/22   Regalado, Belkys A, MD  nicotine  (NICODERM CQ  - DOSED IN MG/24 HOURS) 14 mg/24hr patch Place 1 patch (14 mg total) onto the skin daily. 07/24/22   Regalado, Belkys A, MD  potassium chloride  SA (K-DUR,KLOR-CON ) 20 MEQ tablet Take 20 mEq by mouth 2 (two) times daily.  04/29/15   [provider]  sacubitril -valsartan  (ENTRESTO ) 24-26 MG Take 1 tablet by mouth 2 (two) times daily.    [provider]  spironolactone  (ALDACTONE ) 25 MG tablet Take 12.5 mg by mouth daily.    [provider]  torsemide  (DEMADEX ) 10 MG tablet Take 2 tablets (20 mg total) by mouth at bedtime. 12/11/23   Kandis Devaughn Sayres, MD    Physical Exam: Vitals:   07/08/24 0048 07/08/24 0130 07/08/24 0145 07/08/24 0215  BP:   131/87   Pulse:   79   Resp:   (!) 33   Temp:    98.7 F (37.1 C)  TempSrc:    Oral  SpO2: 94% 94%    Weight:      Height:       Physical Exam Vitals and nursing note reviewed.  Constitutional:      General: She is not in acute distress. HENT:     Head: Normocephalic and atraumatic.  Cardiovascular:     Rate and Rhythm: Normal rate and regular rhythm.     Heart sounds: Normal heart sounds.   Pulmonary:     Effort: Tachypnea present.     Breath sounds: Normal breath sounds.  Abdominal:     Palpations: Abdomen is soft.     Tenderness: There is no abdominal tenderness.  Musculoskeletal:     Right lower leg: Edema present.     Left lower leg: Edema present.  Neurological:     Mental Status: Mental status is at baseline.     Labs on Admission: I have personally reviewed following labs and imaging studies  CBC: Recent Labs  Lab 07/07/24 1848  WBC 4.8  HGB 15.4*  HCT 48.6*  MCV 90.3  PLT 119*   Basic Metabolic Panel: Recent Labs  Lab 07/07/24 1848  NA 141  K 4.4  CL 104  CO2 28  GLUCOSE 109*  BUN 17  CREATININE 1.01*  CALCIUM 8.8*   GFR: Estimated Creatinine Clearance: 53.9 mL/min (A) (by C-G formula based on SCr of 1.01 mg/dL (H)). Liver Function Tests: No results for input(s): AST, ALT, ALKPHOS, BILITOT, PROT, ALBUMIN in the last 168 hours. No results for input(s): LIPASE, AMYLASE in the last 168 hours. No results for input(s): AMMONIA in the last 168 hours. Coagulation Profile: No results for input(s): INR, PROTIME in the last 168 hours. Cardiac Enzymes: No results for input(s): CKTOTAL, CKMB, CKMBINDEX, TROPONINI in the last 168 hours. BNP (last 3 results) No results for input(s): PROBNP in the last 8760 hours. HbA1C: No results for input(s): HGBA1C in the last 72 hours. CBG: No results for input(s): GLUCAP in the last 168 hours. Lipid Profile: No results for input(s): CHOL, HDL, LDLCALC, TRIG, CHOLHDL, LDLDIRECT in the last 72 hours. Thyroid  Function Tests: Recent Labs    07/07/24 1849  TSH 0.971  FREET4 1.28*   Anemia Panel: No results for input(s): VITAMINB12, FOLATE, FERRITIN, TIBC, IRON, RETICCTPCT in the last 72 hours. Urine analysis:    Component Value Date/Time   COLORURINE YELLOW (A) 09/30/2018 1848   APPEARANCEUR CLOUDY (A) 09/30/2018 1848   APPEARANCEUR Cloudy  09/01/2014 1009   LABSPEC 1.008 09/30/2018 1848   LABSPEC 1.027 09/01/2014 1009   PHURINE 7.0 09/30/2018 1848   GLUCOSEU 150 (A) 09/30/2018 1848   GLUCOSEU Negative 09/01/2014 1009   HGBUR SMALL (A) 09/30/2018 1848   BILIRUBINUR NEGATIVE 09/30/2018 1848   BILIRUBINUR Negative 09/01/2014 1009   KETONESUR NEGATIVE 09/30/2018 1848   PROTEINUR 100 (A) 09/30/2018 1848   NITRITE NEGATIVE 09/30/2018 1848   LEUKOCYTESUR NEGATIVE 09/30/2018 1848   LEUKOCYTESUR Negative 09/01/2014 1009    Radiological Exams on Admission: DG Chest 2 View Result Date: 07/07/2024 CLINICAL DATA:  sob EXAM: CHEST - 2 VIEW COMPARISON:  December 10, 2023 FINDINGS: Coarsening of the pulmonary interstitium, possibly due to underlying emphysema. No focal airspace consolidation, pleural effusion, or pneumothorax. Moderate cardiomegaly. Left chest pacemaker/AICD with leads terminating in the right atrium and right ventricle. Tortuous aorta with aortic atherosclerosis. No acute fracture or destructive lesions. Multilevel thoracic osteophytosis. Cholecystectomy clips. IMPRESSION: Moderate cardiomegaly.  No pneumonia or pulmonary edema. Electronically Signed   By: Rogelia Myers M.D.   On: 07/07/2024 19:37   Data Reviewed for HPI: Relevant notes from primary care and specialist visits, past discharge summaries as available in EHR, including Care Everywhere. Prior diagnostic testing as pertinent to current admission diagnoses Updated medications and problem lists for reconciliation ED course, including vitals, labs, imaging, treatment and response to treatment Triage notes, nursing and pharmacy notes and ED provider's notes Notable results as noted above in HPI      Assessment and Plan: * Acute on chronic HFrEF (heart failure with reduced ejection fraction) (HCC) S/p AICD Acute respiratory failure with hypoxia IV Lasix  Continue home GDMT Echocardiogram Daily weights with intake and output monitoring Supplemental oxygen  and wean as tolerated Cardiology consult   COPD (chronic obstructive pulmonary disease) (HCC) Not currently exacerbated Continue home inhalers with DuoNebs as needed  Insulin  dependent type 2 diabetes mellitus (HCC) Sliding scale insulin  coverage  Benign essential hypertension Continue home meds        DVT prophylaxis: Lovenox   Consults: [facility]  Advance Care Planning:   Code Status: Prior   Family Communication: none  Disposition Plan: Back to previous home  environment  Severity of Illness: The appropriate patient status for this patient is OBSERVATION. Observation status is judged to be reasonable and necessary in order to provide the required intensity of service to ensure the patient's safety. The patient's presenting symptoms, physical exam findings, and initial radiographic and laboratory data in the context of their medical condition is felt to place them at decreased risk for further clinical deterioration. Furthermore, it is anticipated that the patient will be medically stable for discharge from the hospital within 2 midnights of admission.   Author: Delayne LULLA Solian, MD 07/08/2024 4:02 AM  For on call review [email or web address].

## 2024-07-08 NOTE — Assessment & Plan Note (Signed)
-   Continue home meds

## 2024-07-08 NOTE — Assessment & Plan Note (Signed)
 Sliding scale insulin  coverage

## 2024-07-08 NOTE — Progress Notes (Signed)
 Heart Failure Navigator Progress Note  Assessed for Heart & Vascular TOC clinic readiness.  Patient does not meet criteria due to current  Advanced Heart Fail;ure Team patient of [facility]. Patient already has a scheduled follow-up on 07/22/24 @ 9:00.  Will add appointment to discharge AVS.  Navigator will sign off at this time.  Charmaine Pines, RN, BSN [facility] Heart Failure Navigator Secure Chat Only

## 2024-07-08 NOTE — ED Notes (Signed)
 Pt placed on 2L [place] d/t O2 saturation decreasing to 88% on room air while sleeping. Pt's O2 saturation increased to 95%.

## 2024-07-08 NOTE — ED Notes (Signed)
 First POC with patient

## 2024-07-08 NOTE — ED Provider Notes (Signed)
 [facility] Provider Note    Event Date/Time   First MD Initiated Contact with Patient 07/08/24 0034     (approximate)   History   Chief Complaint Shortness of Breath   HPI  Heather Choi is a 63 y.o. female with past medical history of hypertension, diabetes, HFrEF, COPD, CKD, and asthma who presents to the ED complaining of shortness of breath.  Patient reports that she has been feeling short of breath for about the past 2 weeks, especially when she exerts herself.  She will also feel like her heart is racing at times.  She has noticed some mild swelling in her legs despite taking her diuretic as prescribed, denies significant weight gain.  She reports occasional aching pain in her chest, but denies any pain currently.  She has not had any fevers or cough.     Physical Exam   Triage Vital Signs: ED Triage Vitals  Encounter Vitals Group     BP 07/07/24 1846 129/81     Girls Systolic BP Percentile --      Girls Diastolic BP Percentile --      Boys Systolic BP Percentile --      Boys Diastolic BP Percentile --      Pulse Rate 07/07/24 1846 72     Resp 07/07/24 1846 (!) 22     Temp 07/07/24 1846 98.9 F (37.2 C)     Temp Source 07/07/24 1846 Oral     SpO2 07/07/24 1846 95 %     Weight 07/07/24 1847 160 lb (72.6 kg)     Height 07/07/24 1847 5' 2 (1.575 m)     Head Circumference --      Peak Flow --      Pain Score 07/07/24 1847 0     Pain Loc --      Pain Education --      Exclude from Growth Chart --     Most recent vital signs: Vitals:   07/08/24 0145 07/08/24 0215  BP: 131/87   Pulse: 79   Resp: (!) 33   Temp:  98.7 F (37.1 C)  SpO2:      Constitutional: Alert and oriented. Eyes: Conjunctivae are normal. Head: Atraumatic. Nose: No congestion/rhinnorhea. Mouth/Throat: Mucous membranes are moist.  Cardiovascular: Normal rate, regular rhythm. Grossly normal heart sounds.  2+ radial pulses bilaterally. Respiratory: Normal  respiratory effort.  No retractions. Lungs with crackles to bilateral bases. Gastrointestinal: Soft and nontender. No distention. Musculoskeletal: No lower extremity tenderness, trace pitting edema to knees bilaterally. Neurologic:  Normal speech and language. No gross focal neurologic deficits are appreciated.    ED Results / Procedures / Treatments   Labs (all labs ordered are listed, but only abnormal results are displayed) Labs Reviewed  BASIC METABOLIC PANEL WITH GFR - Abnormal; Notable for the following components:      Result Value   Glucose, Bld 109 (*)    Creatinine, Ser 1.01 (*)    Calcium 8.8 (*)    All other components within normal limits  CBC - Abnormal; Notable for the following components:   RBC 5.38 (*)    Hemoglobin 15.4 (*)    HCT 48.6 (*)    RDW 15.7 (*)    Platelets 119 (*)    All other components within normal limits  BRAIN NATRIURETIC PEPTIDE - Abnormal; Notable for the following components:   B Natriuretic Peptide 1,414.9 (*)    All other components within normal limits  T4, FREE - Abnormal; Notable for the following components:   Free T4 1.28 (*)    All other components within normal limits  TROPONIN I (HIGH SENSITIVITY) - Abnormal; Notable for the following components:   Troponin I (High Sensitivity) 50 (*)    All other components within normal limits  TROPONIN I (HIGH SENSITIVITY) - Abnormal; Notable for the following components:   Troponin I (High Sensitivity) 50 (*)    All other components within normal limits  TSH     EKG  ED ECG REPORT I, Carlin Palin, the attending physician, personally viewed and interpreted this ECG.   Date: 07/08/2024  EKG Time: 18:55  Rate: 85  Rhythm: normal sinus rhythm  Axis: Normal  Intervals:none  ST&T Change: None  RADIOLOGY Chest x-ray reviewed and interpreted by me with no infiltrate, edema, or effusion.  PROCEDURES:  Critical Care performed: No  Procedures   MEDICATIONS ORDERED IN  ED: Medications  furosemide  (LASIX ) injection 40 mg (40 mg Intravenous Given 07/08/24 0135)     IMPRESSION / MDM / ASSESSMENT AND PLAN / ED COURSE  I reviewed the triage vital signs and the nursing notes.                              63 y.o. female with past medical history of hypertension, diabetes, HFrEF, COPD, CKD, and asthma who presents to the ED with increasing dyspnea on exertion with occasional chest pain and palpitations over the past 2 weeks.  Patient's presentation is most consistent with acute presentation with potential threat to life or bodily function.  Differential diagnosis includes, but is not limited to, ACS, arrhythmia, CHF exacerbation, COPD exacerbation, pneumonia, anemia, electrolyte abnormality, AKI.  Patient nontoxic-appearing and in no acute distress, vital signs are unremarkable.  She is not in any respiratory distress and maintaining oxygen saturations around 94% on room air.  She does appear slightly fluid overloaded but no overt pulmonary edema or other acute finding noted on chest x-ray.  EKG shows no evidence of arrhythmia or ischemia, 2 sets of troponin are mildly elevated but stable compared to previous and I doubt ACS or PE.  Labs without significant anemia, leukocytosis, electrolyte abnormality, or AKI.  Suspect mild CHF exacerbation and we will diurese with IV Lasix , reassess following treatment.  BNP elevated, thyroid  studies are unremarkable.  Patient diuresed with IV Lasix , but continues to feel significantly short of breath with ambulation and dropped oxygen saturations to 79% while walking.  Case discussed with hospitalist for admission.      FINAL CLINICAL IMPRESSION(S) / ED DIAGNOSES   Final diagnoses:  Dyspnea on exertion  Acute on chronic systolic congestive heart failure (HCC)     Rx / DC Orders   ED Discharge Orders     None        Note:  This document was prepared using Dragon voice recognition software and may include  unintentional dictation errors.   Palin Carlin, MD 07/08/24 [phone]

## 2024-07-08 NOTE — Assessment & Plan Note (Signed)
 Not currently exacerbated Continue home inhalers with DuoNebs as needed

## 2024-07-08 NOTE — Plan of Care (Signed)
  Problem: Education: Goal: Ability to describe self-care measures that may prevent or decrease complications (Diabetes Survival Skills Education) will improve Outcome: Progressing Goal: Individualized Educational Video(s) Outcome: Progressing   Problem: Coping: Goal: Ability to adjust to condition or change in health will improve Outcome: Progressing   Problem: Nutritional: Goal: Maintenance of adequate nutrition will improve Outcome: Progressing Goal: Progress toward achieving an optimal weight will improve Outcome: Progressing   Problem: Clinical Measurements: Goal: Ability to maintain clinical measurements within normal limits will improve Outcome: Progressing Goal: Will remain free from infection Outcome: Progressing Goal: Diagnostic test results will improve Outcome: Progressing Goal: Respiratory complications will improve Outcome: Progressing Goal: Cardiovascular complication will be avoided Outcome: Progressing

## 2024-07-08 NOTE — Progress Notes (Signed)
 Heart Failure Stewardship Pharmacy Note  PCP: Diedra Lame, MD PCP-Cardiologist: None  HPI: Heather Choi is a 63 y.o. female with asthma/COPD, chronic HFrEF with LVEF less than 20%, HTN, IDDM who presented with shortness of breath, cough, wheezing, and fever. Tested positive for influenza A. On admission, BNP was 1144.2, HS troponin was 87, and lactic acid was 0.8. Chest x-ray 12/08/23 noted vascular congestion and basilar atelectasis or infiltrate.     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 Normal coronary anatomy noted in 2015. Echocardiogram in 11/2016 noted LVEF of 20-25%. LVEF improved to 35-30% in 10/2017 and was unchanged in 01/2019. Most recent echocardiogram in 07/2022 showed LVEF reduced again to <20% with grade III diastolic dysfunction, severely reduced RV function, severely dilated atria, and mild-moderate AR.   Pertinent Lab Values: Creatinine  Date Value Ref Range Status  09/01/2014 0.93 0.60 - 1.30 mg/dL Final   Creatinine, Ser  Date Value Ref Range Status  07/07/2024 1.01 (H) 0.44 - 1.00 mg/dL Final   BUN  Date Value Ref Range Status  07/07/2024 17 8 - 23 mg/dL Final  95/91/7974 15 8 - 27 mg/dL Final  87/98/7984 14 7 - 18 mg/dL Final   Potassium  Date Value Ref Range Status  07/07/2024 4.4 3.5 - 5.1 mmol/L Final  09/01/2014 4.0 3.5 - 5.1 mmol/L Final   Sodium  Date Value Ref Range Status  07/07/2024 141 135 - 145 mmol/L Final  01/08/2024 144 134 - 144 mmol/L Final  09/01/2014 143 136 - 145 mmol/L Final   B Natriuretic Peptide  Date Value Ref Range Status  07/07/2024 1,414.9 (H) 0.0 - 100.0 pg/mL Final    Comment:    Performed at [facility], [address] [place], [place] [address]   Magnesium   Date Value Ref Range Status  07/20/2022 1.9 1.7 - 2.4 mg/dL Final    Comment:    Performed at [facility], [address] [place], [place] [address]    Hgb A1c MFr Bld  Date Value Ref Range Status  12/07/2023 4.8 4.8 - 5.6 % Final    Comment:    (NOTE)         Prediabetes: 5.7 - 6.4         Diabetes: >6.4         Glycemic control for adults with diabetes: <7.0    Digoxin  Level  Date Value Ref Range Status  05/17/2020 0.4 (L) 0.8 - 2.0 ng/mL Final    Comment:    Performed at [facility], [address] [place], [place] [address]   TSH  Date Value Ref Range Status  07/07/2024 0.971 0.350 - 4.500 uIU/mL Final    Comment:    Performed by a 3rd Generation assay with a functional sensitivity of <=0.01 uIU/mL. Performed at [facility], [address] [place], [place] [address]     Vital Signs: Temp:  [98.5 F (36.9 C)-98.9 F (37.2 C)] 98.5 F (36.9 C) (10/07 0739) Pulse Rate:  [72-88] 82 (10/07 0630) Cardiac Rhythm: Normal sinus rhythm (10/07 0509) Resp:  [20-33] 21 (10/07 0630) BP: (109-132)/(56-87) 110/65 (10/07 0630) SpO2:  [89 %-100 %] 98 % (10/07 0630) Weight:  [72.6 kg (160 lb)] 72.6 kg (160 lb) (10/06 1847) No intake or output data in the 24 hours ending 07/08/24 0831  Current Heart Failure Medications:  Loop diuretic: furosemide  40 mg IV BID Beta-Blocker: carvedilol  12.5 mg BID  ACEI/ARB/ARNI: Entresto  24-26 mg BID MRA: spironolactone  12.5 mg daily SGLT2i: Farxiga  10 mg daily Other: none  Prior to admission Heart Failure Medications:  Loop diuretic: furosemide  40 mg IV BID Beta-Blocker: carvedilol  12.5 mg BID ACEI/ARB/ARNI: Entresto  24-26 mg BID MRA: spironolactone  12.5 mg daily SGLT2i: Farxiga  10 mg daily Other: none  Assessment: 1. 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 NYHA class II-III symptoms.  -Symptoms: Reports shortness of breaht is mildly improved. Orthopnea present. Trace LEE present. Requiring O2. -Volume: Hypervolemic on exam. Reports good urine output on  furosemide  40 mg IV BID. Dose is appropriately increased from home diuretics. -Hemodynamics: BP stable. HR 80s. -BB: Continue current dose of carvedilol  12.5 mg BID. Not currently decompensated, though would avoid titration until close to euvolemic. -ACEI/ARB/ARNI: Continue Entresto  24-26 mg BID. BP too soft to increase at this time. -MRA: Consider increasing spironolactone  to 25 mg daily. -SGLT2i: Continue Farxiga  10 mg daily.  Plan: 1) Medication changes recommended at this time: -Consider increasing spironolactone  25 mg daily  2) Patient assistance: -Pending  3) Education: - Patient has been educated on current HF medications and potential additions to HF medication regimen - Patient verbalizes understanding that over the next few months, these medication doses may change and more medications may be added to optimize HF regimen - Patient has been educated on basic disease state pathophysiology and goals of therapy  Medication Assistance / Insurance Benefits Check: Does the patient have prescription insurance?    Type of insurance plan:  Does the patient qualify for medication assistance through manufacturers or grants? Pending   Outpatient Pharmacy: Prior to admission outpatient pharmacy: [facility]      Please do not hesitate to reach out with questions or concerns,  Jaun Bash, PharmD, CPP, BCPS, [facility] Heart Failure Pharmacist  Phone - [phone] 07/08/2024 1:01 PM

## 2024-07-08 NOTE — ED Notes (Addendum)
 Patient ambulated down hallway, SPO2 dropped to 79% RA with good pleth. MD made aware

## 2024-07-09 DIAGNOSIS — I5023 Acute on chronic systolic (congestive) heart failure: Secondary | ICD-10-CM | POA: Diagnosis not present

## 2024-07-09 LAB — CBC
HCT: 48.6 % — ABNORMAL HIGH (ref 36.0–46.0)
Hemoglobin: 15.5 g/dL — ABNORMAL HIGH (ref 12.0–15.0)
MCH: 28.4 pg (ref 26.0–34.0)
MCHC: 31.9 g/dL (ref 30.0–36.0)
MCV: 89 fL (ref 80.0–100.0)
Platelets: 106 K/uL — ABNORMAL LOW (ref 150–400)
RBC: 5.46 MIL/uL — ABNORMAL HIGH (ref 3.87–5.11)
RDW: 15.5 % (ref 11.5–15.5)
WBC: 4.5 K/uL (ref 4.0–10.5)
nRBC: 0 % (ref 0.0–0.2)

## 2024-07-09 LAB — BASIC METABOLIC PANEL WITH GFR
Anion gap: 11 (ref 5–15)
BUN: 22 mg/dL (ref 8–23)
CO2: 31 mmol/L (ref 22–32)
Calcium: 8.9 mg/dL (ref 8.9–10.3)
Chloride: 99 mmol/L (ref 98–111)
Creatinine, Ser: 1.24 mg/dL — ABNORMAL HIGH (ref 0.44–1.00)
GFR, Estimated: 49 mL/min — ABNORMAL LOW (ref >60)
Glucose, Bld: 122 mg/dL — ABNORMAL HIGH (ref 70–99)
Potassium: 4 mmol/L (ref 3.5–5.1)
Sodium: 141 mmol/L (ref 135–145)

## 2024-07-09 LAB — MAGNESIUM: Magnesium: 2.1 mg/dL (ref 1.7–2.4)

## 2024-07-09 LAB — GLUCOSE, CAPILLARY
Glucose-Capillary: 115 mg/dL — ABNORMAL HIGH (ref 70–99)
Glucose-Capillary: 111 mg/dL — ABNORMAL HIGH (ref 70–99)
Glucose-Capillary: 118 mg/dL — ABNORMAL HIGH (ref 70–99)
Glucose-Capillary: 127 mg/dL — ABNORMAL HIGH (ref 70–99)

## 2024-07-09 LAB — HEMOGLOBIN A1C
Hgb A1c MFr Bld: 4.8 % (ref 4.8–5.6)
Mean Plasma Glucose: 91 mg/dL

## 2024-07-09 MED ORDER — SENNA 8.6 MG PO TABS
1.0000 | ORAL_TABLET | Freq: Every day | ORAL | Status: DC | PRN
  Administered 2024-07-09: 8.6 mg via ORAL
  Filled 2024-07-09: qty 1

## 2024-07-09 NOTE — Plan of Care (Signed)
  Problem: Education: Goal: Ability to describe self-care measures that may prevent or decrease complications (Diabetes Survival Skills Education) will improve Outcome: Progressing   Problem: Coping: Goal: Ability to adjust to condition or change in health will improve Outcome: Progressing   Problem: Fluid Volume: Goal: Ability to maintain a balanced intake and output will improve Outcome: Progressing   Problem: Health Behavior/Discharge Planning: Goal: Ability to identify and utilize available resources and services will improve Outcome: Progressing Goal: Ability to manage health-related needs will improve Outcome: Progressing   Problem: Metabolic: Goal: Ability to maintain appropriate glucose levels will improve Outcome: Progressing   Problem: Nutritional: Goal: Maintenance of adequate nutrition will improve Outcome: Progressing   Problem: Tissue Perfusion: Goal: Adequacy of tissue perfusion will improve Outcome: Progressing   Problem: Clinical Measurements: Goal: Diagnostic test results will improve Outcome: Progressing Goal: Respiratory complications will improve Outcome: Progressing

## 2024-07-09 NOTE — Progress Notes (Signed)
 PROGRESS NOTE    Heather Choi  FMW:[id number] DOB: 12/27/1960 DOA: 07/08/2024 PCP: Diedra Lame, MD   Brief Narrative:  Heather Choi is a 63 y.o. female with medical history significant for COPD, chronic HFrEF with LVEF less than 20% s/p AICD, HTN, IDDM being admitted with acute CHF exacerbation and acute hypoxic respiratory failure requiring 2 L who presents with worsening edema, orthopnea, dyspnea with exertion.   Assessment & Plan:   Principal Problem:   Acute on chronic HFrEF (heart failure with reduced ejection fraction) (HCC) Active Problems:   Acute respiratory failure with hypoxia (HCC)   COPD (chronic obstructive pulmonary disease) (HCC)   Insulin  dependent type 2 diabetes mellitus (HCC)   Benign essential hypertension   Acute exacerbation of CHF (congestive heart failure) (HCC)  Acute respiratory failure with hypoxia Secondary to acute on chronic HFrEF (heart failure with reduced ejection fraction) (HCC) History of AICD -Given symptoms of dyspnea with exertion, edema and orthopnea patient has presumed heart failure exacerbation, improving markedly on diuretics - Continue home core measures including carvedilol , Farxiga , loop diuretic, Entresto , spironolactone  - Echocardiogram notable for EF less than 20% with moderately dilated LV cavity and normal diastolic parameters -this appears to be unchanged from prior echo dating back as far as 2023 - Discussed need for dietary compliance, volume moderation as well as cessation of salt use. - Continue to wean oxygen as tolerated   COPD (chronic obstructive pulmonary disease) (HCC) - Not currently in exacerbation, does not appear to be on any scheduled or as needed inhalers at home.  Continue DuoNebs here but again unlikely related to patient's hypoxia given above   Insulin  dependent type 2 diabetes mellitus (HCC) Sliding scale insulin  coverage, hypoglycemic protocol   Benign essential hypertension Continue home meds    DVT prophylaxis: enoxaparin  (LOVENOX ) injection 40 mg Start: 07/08/24 0800 Code Status:   Code Status: Full Code Family Communication: None present  Status is: Inpatient  Dispo: The patient is from: Home              Anticipated d/c is to: Home              Anticipated d/c date is: 24 to 48 hours              Patient currently not medically stable for discharge  Consultants:  None  Procedures:  None  Antimicrobials:  None indicated  Subjective: No acute issues or events overnight denies nausea vomiting diarrhea constipation any fevers chills or chest pain.  Continues to have moderate dyspnea with exertion but markedly improved from prior.  Objective: Vitals:   07/09/24 0448 07/09/24 0500 07/09/24 0756 07/09/24 0923  BP: 102/63  110/76 114/70  Pulse: 65  62 65  Resp: 18  20 18   Temp: 98.3 F (36.8 C)  (!) 97.4 F (36.3 C) 97.7 F (36.5 C)  TempSrc:   Oral   SpO2: 92%  98% 96%  Weight:  63.7 kg    Height:        Intake/Output Summary (Last 24 hours) at 07/09/2024 1103 Last data filed at 07/09/2024 1031 Gross per 24 hour  Intake 200 ml  Output 450 ml  Net -250 ml   Filed Weights   07/07/24 1847 07/09/24 0500  Weight: 72.6 kg 63.7 kg    Examination:  General:  Pleasantly resting in bed, No acute distress. HEENT:  Normocephalic atraumatic.  Sclerae nonicteric, noninjected.  Extraocular movements intact bilaterally. Neck:  Without mass or deformity.  Trachea  is midline. Lungs:  Clear to auscultate bilaterally without rhonchi, wheeze, or rales. Heart:  Regular rate and rhythm.  Without murmurs, rubs, or gallops. Abdomen:  Soft, nontender, nondistended.  Without guarding or rebound. Extremities: Without cyanosis, clubbing, edema, or obvious deformity. Skin:  Warm and dry, no erythema.  Data Reviewed: I have personally reviewed following labs and imaging studies  CBC: Recent Labs  Lab 07/07/24 1848 07/09/24 0156  WBC 4.8 4.5  HGB 15.4* 15.5*  HCT 48.6*  48.6*  MCV 90.3 89.0  PLT 119* 106*   Basic Metabolic Panel: Recent Labs  Lab 07/07/24 1848 07/08/24 1728 07/09/24 0156  NA 141 139 141  K 4.4 4.0 4.0  CL 104 100 99  CO2 28 29 31   GLUCOSE 109* 97 122*  BUN 17 16 22   CREATININE 1.01* 1.04* 1.24*  CALCIUM 8.8* 8.7* 8.9  MG  --   --  2.1   GFR: Estimated Creatinine Clearance: 41.2 mL/min (A) (by C-G formula based on SCr of 1.24 mg/dL (H)).  HbA1C: Recent Labs    07/08/24 0818  HGBA1C 4.8   CBG: Recent Labs  Lab 07/08/24 0750 07/08/24 1141 07/08/24 1604 07/08/24 2106 07/09/24 0756  GLUCAP 95 171* 111* 130* 115*   Lipid Profile: No results for input(s): CHOL, HDL, LDLCALC, TRIG, CHOLHDL, LDLDIRECT in the last 72 hours. Thyroid  Function Tests: Recent Labs    07/07/24 1849  TSH 0.971  FREET4 1.28*   Anemia Panel: No results for input(s): VITAMINB12, FOLATE, FERRITIN, TIBC, IRON, RETICCTPCT in the last 72 hours. Sepsis Labs: No results for input(s): PROCALCITON, LATICACIDVEN in the last 168 hours.  No results found for this or any previous visit (from the past 240 hours).       Radiology Studies: ECHOCARDIOGRAM COMPLETE Result Date: 07/08/2024    ECHOCARDIOGRAM REPORT   Patient Name:   Heather Choi Date of Exam: 07/08/2024 Medical Rec #:  [id number]      Height:       62.0 in Accession #:    [id number]     Weight:       160.0 lb Date of Birth:  April 25, 1961       BSA:          1.739 m Patient Age:    63 years       BP:           104/60 mmHg Patient Gender: F              HR:           71 bpm. Exam Location:  [facility] Procedure: 2D Echo, Color Doppler and Cardiac Doppler (Both Spectral and Color            Flow Doppler were utilized during procedure). Indications:     CHF-Acute Systolic I50.21  History:         Patient has prior history of Echocardiogram examinations, most                  recent 07/21/2022. CHF; Defibrillator.  Sonographer:     Ashley McNeely-Sloane Referring Phys:  [id number]  DELAYNE LULLA SOLIAN Diagnosing Phys: Marsa Dooms MD IMPRESSIONS  1. Left ventricular ejection fraction, by estimation, is <20%. The left ventricle has severely decreased function. The left ventricle demonstrates global hypokinesis. The left ventricular internal cavity size was moderately dilated. Left ventricular diastolic parameters were normal.  2. Right ventricular systolic function is normal. The right ventricular size is normal.  3. Left atrial size  was moderately dilated.  4. The mitral valve is normal in structure. Moderate mitral valve regurgitation. No evidence of mitral stenosis.  5. The aortic valve is normal in structure. Aortic valve regurgitation is moderate. No aortic stenosis is present.  6. The inferior vena cava is normal in size with greater than 50% respiratory variability, suggesting right atrial pressure of 3 mmHg. FINDINGS  Left Ventricle: Left ventricular ejection fraction, by estimation, is <20%. The left ventricle has severely decreased function. The left ventricle demonstrates global hypokinesis. Strain was performed and the global longitudinal strain is indeterminate.  The left ventricular internal cavity size was moderately dilated. There is no left ventricular hypertrophy. Left ventricular diastolic parameters were normal. Right Ventricle: The right ventricular size is normal. No increase in right ventricular wall thickness. Right ventricular systolic function is normal. Left Atrium: Left atrial size was moderately dilated. Right Atrium: Right atrial size was normal in size. Pericardium: There is no evidence of pericardial effusion. Mitral Valve: The mitral valve is normal in structure. Moderate mitral valve regurgitation. No evidence of mitral valve stenosis. MV peak gradient, 3.7 mmHg. The mean mitral valve gradient is 2.0 mmHg. Tricuspid Valve: The tricuspid valve is normal in structure. Tricuspid valve regurgitation is not demonstrated. No evidence of tricuspid stenosis. Aortic  Valve: The aortic valve is normal in structure. Aortic valve regurgitation is moderate. Aortic regurgitation PHT measures 486 msec. No aortic stenosis is present. Aortic valve mean gradient measures 3.0 mmHg. Aortic valve peak gradient measures 6.2 mmHg. Aortic valve area, by VTI measures 1.28 cm. Pulmonic Valve: The pulmonic valve was normal in structure. Pulmonic valve regurgitation is not visualized. No evidence of pulmonic stenosis. Aorta: The aortic root is normal in size and structure. Venous: The inferior vena cava is normal in size with greater than 50% respiratory variability, suggesting right atrial pressure of 3 mmHg. IAS/Shunts: No atrial level shunt detected by color flow Doppler. Additional Comments: 3D was performed not requiring image post processing on an independent workstation and was indeterminate.  LEFT VENTRICLE PLAX 2D LVIDd:         6.20 cm      Diastology LVIDs:         5.80 cm      LV e' medial:    3.99 cm/s LV PW:         1.40 cm      LV E/e' medial:  22.9 LV IVS:        1.00 cm      LV e' lateral:   3.81 cm/s LVOT diam:     2.10 cm      LV E/e' lateral: 23.9 LV SV:         28 LV SV Index:   16 LVOT Area:     3.46 cm  LV Volumes (MOD) LV vol d, MOD A2C: 131.0 ml LV vol d, MOD A4C: 157.0 ml LV vol s, MOD A2C: 117.0 ml LV vol s, MOD A4C: 124.0 ml LV SV MOD A2C:     14.0 ml LV SV MOD A4C:     157.0 ml LV SV MOD BP:      27.6 ml RIGHT VENTRICLE            IVC RV Basal diam:  4.90 cm    IVC diam: 1.60 cm RV Mid diam:    3.80 cm RV S prime:     7.83 cm/s TAPSE (M-mode): 1.6 cm LEFT ATRIUM  Index        RIGHT ATRIUM           Index LA diam:        4.80 cm 2.76 cm/m   RA Area:     14.40 cm LA Vol (A2C):   68.3 ml 39.28 ml/m  RA Volume:   28.20 ml  16.22 ml/m LA Vol (A4C):   84.0 ml 48.31 ml/m LA Biplane Vol: 83.0 ml 47.74 ml/m  AORTIC VALVE                    PULMONIC VALVE AV Area (Vmax):    1.67 cm     PV Vmax:        0.56 m/s AV Area (Vmean):   1.56 cm     PV Vmean:        39.300 cm/s AV Area (VTI):     1.28 cm     PV VTI:         0.107 m AV Vmax:           124.00 cm/s  PV Peak grad:   1.3 mmHg AV Vmean:          80.200 cm/s  PV Mean grad:   1.0 mmHg AV VTI:            0.216 m      RVOT Peak grad: 1 mmHg AV Peak Grad:      6.2 mmHg AV Mean Grad:      3.0 mmHg LVOT Vmax:         59.70 cm/s LVOT Vmean:        36.200 cm/s LVOT VTI:          0.080 m LVOT/AV VTI ratio: 0.37 AI PHT:            486 msec  AORTA Ao Root diam: 3.30 cm Ao Asc diam:  3.20 cm MITRAL VALVE                  TRICUSPID VALVE MV Area (PHT): 5.13 cm       TR Peak grad:   20.2 mmHg MV Area VTI:   0.90 cm       TR Mean grad:   17.0 mmHg MV Peak grad:  3.7 mmHg       TR Vmax:        225.00 cm/s MV Mean grad:  2.0 mmHg       TR Vmean:       203.0 cm/s MV Vmax:       0.96 m/s MV Vmean:      60.5 cm/s      SHUNTS MV Decel Time: 148 msec       Systemic VTI:  0.08 m MR Peak grad:    79.6 mmHg    Systemic Diam: 2.10 cm MR Mean grad:    55.0 mmHg    Pulmonic VTI:  0.087 m MR Vmax:         446.00 cm/s MR Vmean:        359.0 cm/s MR PISA:         3.08 cm MR PISA Eff ROA: 25 mm MR PISA Radius:  0.70 cm MV E velocity: 91.20 cm/s MV A velocity: 58.70 cm/s MV E/A ratio:  1.55 Marsa Dooms MD Electronically signed by Marsa Dooms MD Signature Date/Time: 07/08/2024/4:47:36 PM    Final    DG Chest 2 View Result Date: 07/07/2024 CLINICAL DATA:  sob EXAM: CHEST -  2 VIEW COMPARISON:  December 10, 2023 FINDINGS: Coarsening of the pulmonary interstitium, possibly due to underlying emphysema. No focal airspace consolidation, pleural effusion, or pneumothorax. Moderate cardiomegaly. Left chest pacemaker/AICD with leads terminating in the right atrium and right ventricle. Tortuous aorta with aortic atherosclerosis. No acute fracture or destructive lesions. Multilevel thoracic osteophytosis. Cholecystectomy clips. IMPRESSION: Moderate cardiomegaly.  No pneumonia or pulmonary edema. Electronically Signed   By: Rogelia Myers M.D.    On: 07/07/2024 19:37        Scheduled Meds:  aspirin  EC  81 mg Oral Daily   carvedilol   12.5 mg Oral BID WC   dapagliflozin  propanediol  10 mg Oral QAC breakfast   enoxaparin  (LOVENOX ) injection  40 mg Subcutaneous Q24H   furosemide   40 mg Intravenous BID   insulin  aspart  0-15 Units Subcutaneous TID WC   insulin  aspart  0-5 Units Subcutaneous QHS   levothyroxine   125 mcg Oral Daily   lidocaine  1 patch Transdermal Q24H   sacubitril -valsartan   1 tablet Oral BID   spironolactone   12.5 mg Oral Daily   Continuous Infusions:   LOS: 1 day   Time spent:  Elsie JAYSON Montclair, DO [facility]  If 7PM-7AM, please contact night-coverage [email or web address]  07/09/2024, 11:03 AM

## 2024-07-09 NOTE — Progress Notes (Signed)
       CROSS COVER NOTE  NAME: Heather Choi MRN: [id number] DOB : 1961/08/24    Concern as stated by nurse / staff   patient had another 22 beats of non sustained VTAC. asymptomatic. I checked also her calcium as of 10/7 17:28 was 8.7      Pertinent findings on chart review: History of HFrEF EF less than 20% s/p AICD.  No AICD firing  Patient Assessment   Assessment and  Interventions   Assessment:  HFrEF s/p AICD Episodes of asymptomatic V. tach, not unexpected given severe HFrEF  Plan: Check BMP and magnesium  and supplement if needed X X

## 2024-07-09 NOTE — Progress Notes (Signed)
 Heart Failure Stewardship Pharmacy Note  PCP: Diedra Lame, MD PCP-Cardiologist: None  HPI: Heather Choi is a 63 y.o. female with asthma/COPD, chronic HFrEF with LVEF less than 20%, HTN, IDDM who presented with shortness of breath, cough, wheezing, and fever. Tested positive for influenza A. On admission, BNP was 1144.2, HS troponin was 87, and lactic acid was 0.8. Chest x-ray 12/08/23 noted vascular congestion and basilar atelectasis or infiltrate.     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 Normal coronary anatomy noted in 2015. Echocardiogram in 11/2016 noted LVEF of 20-25%. LVEF improved to 35-30% in 10/2017 and was unchanged in 01/2019. Most recent echocardiogram in 07/2022 showed LVEF reduced again to <20% with grade III diastolic dysfunction, severely reduced RV function, severely dilated atria, and mild-moderate AR.   Pertinent Lab Values: Creatinine  Date Value Ref Range Status  09/01/2014 0.93 0.60 - 1.30 mg/dL Final   Creatinine, Ser  Date Value Ref Range Status  07/09/2024 1.24 (H) 0.44 - 1.00 mg/dL Final   BUN  Date Value Ref Range Status  07/09/2024 22 8 - 23 mg/dL Final  95/91/7974 15 8 - 27 mg/dL Final  87/98/7984 14 7 - 18 mg/dL Final   Potassium  Date Value Ref Range Status  07/09/2024 4.0 3.5 - 5.1 mmol/L Final  09/01/2014 4.0 3.5 - 5.1 mmol/L Final   Sodium  Date Value Ref Range Status  07/09/2024 141 135 - 145 mmol/L Final  01/08/2024 144 134 - 144 mmol/L Final  09/01/2014 143 136 - 145 mmol/L Final   B Natriuretic Peptide  Date Value Ref Range Status  07/07/2024 1,414.9 (H) 0.0 - 100.0 pg/mL Final    Comment:    Performed at [facility], [address] [place], [place] [address]   Magnesium   Date Value Ref Range Status  07/09/2024 2.1 1.7 - 2.4 mg/dL Final    Comment:    Performed at [facility], [address] [place], [place] [address]    Hgb A1c MFr Bld  Date Value Ref Range Status  07/08/2024 4.8 4.8 - 5.6 % Final    Comment:    (NOTE)         Prediabetes: 5.7 - 6.4         Diabetes: >6.4         Glycemic control for adults with diabetes: <7.0    Digoxin  Level  Date Value Ref Range Status  05/17/2020 0.4 (L) 0.8 - 2.0 ng/mL Final    Comment:    Performed at [facility], [address] [place], [place] [address]   TSH  Date Value Ref Range Status  07/07/2024 0.971 0.350 - 4.500 uIU/mL Final    Comment:    Performed by a 3rd Generation assay with a functional sensitivity of <=0.01 uIU/mL. Performed at [facility], [address] [place], [place] [address]     Vital Signs: Temp:  [97.4 F (36.3 C)-98.3 F (36.8 C)] 97.4 F (36.3 C) (10/08 0756) Pulse Rate:  [62-77] 62 (10/08 0756) Cardiac Rhythm: Normal sinus rhythm (10/07 1900) Resp:  [17-22] 20 (10/08 0756) BP: (98-119)/(59-82) 110/76 (10/08 0756) SpO2:  [87 %-100 %] 98 % (10/08 0756) Weight:  [63.7 kg (140 lb 8 oz)] 63.7 kg (140 lb 8 oz) (10/08 0500)  Intake/Output Summary (Last 24 hours) at 07/09/2024 0758 Last data filed at 07/09/2024 0400 Gross per 24 hour  Intake 200 ml  Output 500  ml  Net -300 ml    Current Heart Failure Medications:  Loop diuretic: furosemide  40 mg IV BID Beta-Blocker: carvedilol  12.5 mg BID ACEI/ARB/ARNI: Entresto  24-26 mg BID MRA: spironolactone  12.5 mg daily SGLT2i: Farxiga  10 mg daily Other: none  Prior to admission Heart Failure Medications:  Loop diuretic: torsemide  10 mg BID Beta-Blocker: carvedilol  12.5 mg BID ACEI/ARB/ARNI: Entresto  24-26 mg BID MRA: spironolactone  12.5 mg daily SGLT2i: Farxiga  10 mg daily Other: none  Assessment: 1. Acute on chronic combined systolic and diastolic heart failure (LVEF <20%) with grade III diastolic dysfunction, due to nonishchemic cardiomyopathy. NYHA class II-III symptoms.  -Symptoms: Reports shortness of breaht is mildly improved. Orthopnea  present. Trace LEE present. Requiring O2. -Volume: Hypervolemic on exam. Reports good urine output on furosemide  40 mg IV BID. Creatinine slightly up today, would continue IV dose this morning and can consider transition to oral diuretics tomorrow at increased dose from home regimen. -Hemodynamics: BP stable. HR 80s. -BB: Continue current dose of carvedilol  12.5 mg BID. Not currently decompensated, though would avoid titration until close to euvolemic. -ACEI/ARB/ARNI: Continue Entresto  24-26 mg BID. BP too soft to increase at this time. -MRA: Consider increasing spironolactone  to 25 mg daily. This should have minimal effect on BP -SGLT2i: Continue Farxiga  10 mg daily.  Plan: 1) Medication changes recommended at this time: -Consider increasing spironolactone  to 25 mg daily tomorrow  2) Patient assistance: -Pending  3) Education: - Patient has been educated on current HF medications and potential additions to HF medication regimen - Patient verbalizes understanding that over the next few months, these medication doses may change and more medications may be added to optimize HF regimen - Patient has been educated on basic disease state pathophysiology and goals of therapy  Medication Assistance / Insurance Benefits Check: Does the patient have prescription insurance?    Type of insurance plan:  Does the patient qualify for medication assistance through manufacturers or grants? Pending   Outpatient Pharmacy: Prior to admission outpatient pharmacy: [facility]      Please do not hesitate to reach out with questions or concerns,  Jaun Bash, PharmD, CPP, BCPS, [facility] Heart Failure Pharmacist  Phone - [phone] 07/09/2024 7:58 AM

## 2024-07-09 NOTE — Progress Notes (Signed)
 [facility] CLINIC CARDIOLOGY PROGRESS NOTE       Patient ID: Heather Choi MRN: [id number] DOB/AGE: [birth year] 62 y.o.  Admit date: 07/08/2024 Referring Physician Dr. Delayne Solian Primary Physician Diedra Lame, MD  Primary Cardiologist Dr. Annalee Casa Reason for Consultation acute heart failure  HPI: Heather Choi is a 63 y.o. 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 Cardiology was consulted for further evaluation.   Interval history: -Patient seen and examined, feeling ok overall today.  -With some SOB this AM, crackles on exam. Still on supplemental O2.  -BP and HR stable today, one 22 beat run of VT noted on tele overnight.   Review of systems complete and found to be negative unless listed above    Past Medical History:  Diagnosis Date   Asthma    Bell's palsy    fingers tingling in cold weather   CHF (congestive heart failure) (HCC)    COPD (chronic obstructive pulmonary disease) (HCC)    Diabetes mellitus without complication (HCC)    Enlarged heart    Hypertension    Thyroid  disease     Past Surgical History:  Procedure Laterality Date   ABDOMINAL HYSTERECTOMY     CHOLECYSTECTOMY     IMPLANTABLE CARDIOVERTER DEFIBRILLATOR IMPLANT     laparoscopic bilateral oophorectomy with removal of adnexal mass and lysis of adhesions  11/20/2017   LAPAROSCOPY     with removal of adnexal structure    Medications Prior to Admission  Medication Sig Dispense Refill Last Dose/Taking   albuterol  (VENTOLIN  HFA) 108 (90 Base) MCG/ACT inhaler Inhale 2 puffs into the  lungs every 6 (six) hours as needed for wheezing or shortness of breath. 8 g 2 Unknown   ALPRAZolam  (XANAX ) 0.5 MG tablet Take 0.5 mg by mouth daily as needed for sleep.   07/07/2024   aspirin  EC 81 MG tablet Take 81 mg by mouth daily.    07/07/2024 Morning   Aspirin -Caffeine (BC ON THE GO) 845-65 MG PACK Take 1 Package by mouth daily as needed (headaches).   Unknown   carvedilol  (COREG ) 12.5 MG tablet Take 12.5 mg by mouth 2 (two) times daily with a meal.   07/07/2024 Morning   cetirizine (ZYRTEC) 10 MG tablet Take 10 mg by mouth daily as needed for allergies.   Unknown   ibuprofen (ADVIL) 600 MG tablet Take 600 mg by mouth every 6 (six) hours as needed for mild pain (pain score 1-3).   Unknown   levothyroxine  (SYNTHROID ) 137 MCG tablet Take 137 mcg by mouth daily.   07/07/2024 Morning   Liniments (SALONPAS PAIN RELIEF PATCH EX) Apply 1 patch topically daily as needed (pain).   Unknown   nicotine  (NICODERM CQ  - DOSED IN MG/24 HOURS) 14 mg/24hr patch Place 1 patch (14 mg total) onto the skin daily. 28 patch 0 Unknown   potassium chloride  SA (K-DUR,KLOR-CON ) 20 MEQ tablet Take 20 mEq by mouth 2 (two) times daily.    07/07/2024 Morning   sacubitril -valsartan  (ENTRESTO ) 24-26 MG Take 1 tablet by mouth 2 (two) times daily.   07/07/2024 Morning   spironolactone  (ALDACTONE ) 25 MG tablet Take 12.5 mg by mouth  daily.   Unknown   torsemide  (DEMADEX ) 10 MG tablet Take 2 tablets (20 mg total) by mouth at bedtime. (Patient taking differently: Take 10 mg by mouth 2 (two) times daily.)   Taking Differently   dapagliflozin  propanediol (FARXIGA ) 10 MG TABS tablet Take 1 tablet (10 mg total) by mouth daily before breakfast. (Patient not taking: Reported on 07/08/2024) 30 tablet 6 Not Taking   guaiFENesin  (MUCINEX ) 600 MG 12 hr tablet Take 1 tablet (600 mg total) by mouth 2 (two) times daily. (Patient not taking: Reported on 07/08/2024) 30 tablet 0 Not Taking   montelukast  (SINGULAIR ) 10 MG tablet Take 1 tablet by mouth at  bedtime. (Patient not taking: Reported on 07/08/2024)   Not Taking   Multiple Vitamin (MULTIVITAMIN WITH MINERALS) TABS tablet Take 1 tablet by mouth daily. (Patient not taking: Reported on 07/08/2024) 30 tablet 0 Not Taking   Social History   Socioeconomic History   Marital status: Single    Spouse name: Not on file   Number of children: Not on file   Years of education: Not on file   Highest education level: Not on file  Occupational History   Not on file  Tobacco Use   Smoking status: Every Day    Current packs/day: 0.50    Types: Cigarettes   Smokeless tobacco: Never  Vaping Use   Vaping status: Never Used  Substance and Sexual Activity   Alcohol use: Not Currently    Comment: rarely   Drug use: No   Sexual activity: Not Currently  Other Topics Concern   Not on file  Social History Narrative   Not on file   Social Drivers of Health   Financial Resource Strain: Patient Declined (03/17/2024)   Received from [facility]   Overall Financial Resource Strain (CARDIA)    Difficulty of Paying Living Expenses: Patient declined  Food Insecurity: No Food Insecurity (07/08/2024)   Hunger Vital Sign    Worried About Running Out of Food in the Last Year: Never true    Ran Out of Food in the Last Year: Never true  Transportation Needs: No Transportation Needs (07/08/2024)   PRAPARE - Administrator, Civil Service (Medical): No    Lack of Transportation (Non-Medical): No  Physical Activity: Not on file  Stress: Not on file  Social Connections: Not on file  Intimate Partner Violence: Not At Risk (07/08/2024)   Humiliation, Afraid, Rape, and Kick questionnaire    Fear of Current or Ex-Partner: No    Emotionally Abused: No    Physically Abused: No    Sexually Abused: No    Family History  Problem Relation Age of Onset   Heart failure Father    Heart attack Mother      Vitals:   07/09/24 0400 07/09/24 0448 07/09/24 0500 07/09/24 0756  BP:  102/63   110/76  Pulse:  65  62  Resp:  18  20  Temp:  98.3 F (36.8 C)  (!) 97.4 F (36.3 C)  TempSrc:    Oral  SpO2: 93% 92%  98%  Weight:   63.7 kg   Height:        PHYSICAL EXAM General: Chronically ill-appearing female, well nourished, in no acute distress. HEENT: Normocephalic and atraumatic. Neck: No JVD.  Lungs: Normal respiratory effort on 3L [place]. Clear bilaterally to auscultation. No wheezes, crackles, rhonchi.  Heart: HRRR. Normal S1 and S2 without gallops or murmurs.  Abdomen: Non-distended appearing.  Msk:  Normal strength and tone for age. Extremities: Warm and well perfused. No clubbing, cyanosis.  Trace edema.  Neuro: Alert and oriented X 3. Psych: Answers questions appropriately.   Labs: Basic Metabolic Panel: Recent Labs    07/08/24 1728 07/09/24 0156  NA 139 141  K 4.0 4.0  CL 100 99  CO2 29 31  GLUCOSE 97 122*  BUN 16 22  CREATININE 1.04* 1.24*  CALCIUM 8.7* 8.9  MG  --  2.1   Liver Function Tests: No results for input(s): AST, ALT, ALKPHOS, BILITOT, PROT, ALBUMIN in the last 72 hours. No results for input(s): LIPASE, AMYLASE in the last 72 hours. CBC: Recent Labs    07/07/24 1848 07/09/24 0156  WBC 4.8 4.5  HGB 15.4* 15.5*  HCT 48.6* 48.6*  MCV 90.3 89.0  PLT 119* 106*   Cardiac Enzymes: Recent Labs    07/07/24 1848 07/07/24 2034  TROPONINIHS 50* 50*   BNP: Recent Labs    07/07/24 1848  BNP 1,414.9*   D-Dimer: No results for input(s): DDIMER in the last 72 hours. Hemoglobin A1C: Recent Labs    07/08/24 0818  HGBA1C 4.8   Fasting Lipid Panel: No results for input(s): CHOL, HDL, LDLCALC, TRIG, CHOLHDL, LDLDIRECT in the last 72 hours. Thyroid  Function Tests: Recent Labs    07/07/24 1849  TSH 0.971   Anemia Panel: No results for input(s): VITAMINB12, FOLATE, FERRITIN, TIBC, IRON, RETICCTPCT in the last 72 hours.   Radiology: ECHOCARDIOGRAM COMPLETE Result Date: 07/08/2024     ECHOCARDIOGRAM REPORT   Patient Name:   Heather Choi Date of Exam: 07/08/2024 Medical Rec #:  [id number]      Height:       62.0 in Accession #:    [id number]     Weight:       160.0 lb Date of Birth:  [birth year]       BSA:          1.739 m Patient Age:    63 years       BP:           104/60 mmHg Patient Gender: F              HR:           71 bpm. Exam Location:  [facility] Procedure: 2D Echo, Color Doppler and Cardiac Doppler (Both Spectral and Color            Flow Doppler were utilized during procedure). Indications:     CHF-Acute Systolic I50.21  History:         Patient has prior history of Echocardiogram examinations, most                  recent 07/21/2022. CHF; Defibrillator.  Sonographer:     Ashley McNeely-Sloane Referring Phys:  [id number] DELAYNE LULLA SOLIAN Diagnosing Phys: Marsa Dooms MD IMPRESSIONS  1. Left ventricular ejection fraction, by estimation, is <20%. The left ventricle has severely decreased function. The left ventricle demonstrates global hypokinesis. The left ventricular internal cavity size was moderately dilated. Left ventricular diastolic parameters were normal.  2. Right ventricular systolic function is normal. The right ventricular size is normal.  3. Left atrial size was moderately dilated.  4. The mitral valve is normal in structure. Moderate mitral valve regurgitation. No evidence of mitral stenosis.  5. The aortic valve is normal in structure. Aortic valve regurgitation is moderate. No aortic stenosis is present.  6. The inferior vena cava is normal in size  with greater than 50% respiratory variability, suggesting right atrial pressure of 3 mmHg. FINDINGS  Left Ventricle: Left ventricular ejection fraction, by estimation, is <20%. The left ventricle has severely decreased function. The left ventricle demonstrates global hypokinesis. Strain was performed and the global longitudinal strain is indeterminate.  The left ventricular internal cavity size was moderately dilated. There is no  left ventricular hypertrophy. Left ventricular diastolic parameters were normal. Right Ventricle: The right ventricular size is normal. No increase in right ventricular wall thickness. Right ventricular systolic function is normal. Left Atrium: Left atrial size was moderately dilated. Right Atrium: Right atrial size was normal in size. Pericardium: There is no evidence of pericardial effusion. Mitral Valve: The mitral valve is normal in structure. Moderate mitral valve regurgitation. No evidence of mitral valve stenosis. MV peak gradient, 3.7 mmHg. The mean mitral valve gradient is 2.0 mmHg. Tricuspid Valve: The tricuspid valve is normal in structure. Tricuspid valve regurgitation is not demonstrated. No evidence of tricuspid stenosis. Aortic Valve: The aortic valve is normal in structure. Aortic valve regurgitation is moderate. Aortic regurgitation PHT measures 486 msec. No aortic stenosis is present. Aortic valve mean gradient measures 3.0 mmHg. Aortic valve peak gradient measures 6.2 mmHg. Aortic valve area, by VTI measures 1.28 cm. Pulmonic Valve: The pulmonic valve was normal in structure. Pulmonic valve regurgitation is not visualized. No evidence of pulmonic stenosis. Aorta: The aortic root is normal in size and structure. Venous: The inferior vena cava is normal in size with greater than 50% respiratory variability, suggesting right atrial pressure of 3 mmHg. IAS/Shunts: No atrial level shunt detected by color flow Doppler. Additional Comments: 3D was performed not requiring image post processing on an independent workstation and was indeterminate.  LEFT VENTRICLE PLAX 2D LVIDd:         6.20 cm      Diastology LVIDs:         5.80 cm      LV e' medial:    3.99 cm/s LV PW:         1.40 cm      LV E/e' medial:  22.9 LV IVS:        1.00 cm      LV e' lateral:   3.81 cm/s LVOT diam:     2.10 cm      LV E/e' lateral: 23.9 LV SV:         28 LV SV Index:   16 LVOT Area:     3.46 cm  LV Volumes (MOD) LV vol d, MOD  A2C: 131.0 ml LV vol d, MOD A4C: 157.0 ml LV vol s, MOD A2C: 117.0 ml LV vol s, MOD A4C: 124.0 ml LV SV MOD A2C:     14.0 ml LV SV MOD A4C:     157.0 ml LV SV MOD BP:      27.6 ml RIGHT VENTRICLE            IVC RV Basal diam:  4.90 cm    IVC diam: 1.60 cm RV Mid diam:    3.80 cm RV S prime:     7.83 cm/s TAPSE (M-mode): 1.6 cm LEFT ATRIUM             Index        RIGHT ATRIUM           Index LA diam:        4.80 cm 2.76 cm/m   RA Area:     14.40 cm LA Vol (  A2C):   68.3 ml 39.28 ml/m  RA Volume:   28.20 ml  16.22 ml/m LA Vol (A4C):   84.0 ml 48.31 ml/m LA Biplane Vol: 83.0 ml 47.74 ml/m  AORTIC VALVE                    PULMONIC VALVE AV Area (Vmax):    1.67 cm     PV Vmax:        0.56 m/s AV Area (Vmean):   1.56 cm     PV Vmean:       39.300 cm/s AV Area (VTI):     1.28 cm     PV VTI:         0.107 m AV Vmax:           124.00 cm/s  PV Peak grad:   1.3 mmHg AV Vmean:          80.200 cm/s  PV Mean grad:   1.0 mmHg AV VTI:            0.216 m      RVOT Peak grad: 1 mmHg AV Peak Grad:      6.2 mmHg AV Mean Grad:      3.0 mmHg LVOT Vmax:         59.70 cm/s LVOT Vmean:        36.200 cm/s LVOT VTI:          0.080 m LVOT/AV VTI ratio: 0.37 AI PHT:            486 msec  AORTA Ao Root diam: 3.30 cm Ao Asc diam:  3.20 cm MITRAL VALVE                  TRICUSPID VALVE MV Area (PHT): 5.13 cm       TR Peak grad:   20.2 mmHg MV Area VTI:   0.90 cm       TR Mean grad:   17.0 mmHg MV Peak grad:  3.7 mmHg       TR Vmax:        225.00 cm/s MV Mean grad:  2.0 mmHg       TR Vmean:       203.0 cm/s MV Vmax:       0.96 m/s MV Vmean:      60.5 cm/s      SHUNTS MV Decel Time: 148 msec       Systemic VTI:  0.08 m MR Peak grad:    79.6 mmHg    Systemic Diam: 2.10 cm MR Mean grad:    55.0 mmHg    Pulmonic VTI:  0.087 m MR Vmax:         446.00 cm/s MR Vmean:        359.0 cm/s MR PISA:         3.08 cm MR PISA Eff ROA: 25 mm MR PISA Radius:  0.70 cm MV E velocity: 91.20 cm/s MV A velocity: 58.70 cm/s MV E/A ratio:  1.55 Marsa Dooms MD Electronically signed by Marsa Dooms MD Signature Date/Time: 07/08/2024/4:47:36 PM    Final    DG Chest 2 View Result Date: 07/07/2024 CLINICAL DATA:  sob EXAM: CHEST - 2 VIEW COMPARISON:  December 10, 2023 FINDINGS: Coarsening of the pulmonary interstitium, possibly due to underlying emphysema. No focal airspace consolidation, pleural effusion, or pneumothorax. Moderate cardiomegaly. Left chest pacemaker/AICD with leads terminating in the right atrium and right ventricle. Tortuous aorta with aortic  atherosclerosis. No acute fracture or destructive lesions. Multilevel thoracic osteophytosis. Cholecystectomy clips. IMPRESSION: Moderate cardiomegaly.  No pneumonia or pulmonary edema. Electronically Signed   By: Rogelia Myers M.D.   On: 07/07/2024 19:37    ECHO as above  TELEMETRY reviewed by me 07/09/2024: sinus rhythm rate 60s  EKG reviewed by me: sinus rhythm rate 85 bpm  Data reviewed by me 07/09/2024: last 24h vitals tele labs imaging I/O ED provider note, admission H&P, hospitalist progress note  Principal Problem:   Acute on chronic HFrEF (heart failure with reduced ejection fraction) (HCC) Active Problems:   COPD (chronic obstructive pulmonary disease) (HCC)   Benign essential hypertension   Insulin  dependent type 2 diabetes mellitus (HCC)   Acute respiratory failure with hypoxia (HCC)   Acute exacerbation of CHF (congestive heart failure) (HCC)    ASSESSMENT AND PLAN:  Heather Choi is a 63 y.o. 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 Cardiology was  consulted for further evaluation.   # Acute on chronic HFrEF # Non-ischemic cardiomyopathy # S/p ICD # Hypertension Patient presented with 2 weeks of worsening shortness of breath and lower extremity edema.  BNP elevated at 1500.  Troponins mild and flat at 50 > 50.  Started on IV Lasix . Echo with EF <20% (stable), global hypokinesis, moderate LV dilation, normal RV size and function, moderate MR. - Continue IV Lasix  40 mg twice daily.  Patient endorses good urine output. - Continue propranolol 12.5 mg daily (consider decreasing dose if concern for shock), Entresto  24-26 mg twice daily, spironolactone  12.5 mg daily.  Likely plan for uptitration of spironolactone  tomorrow.  Patient previously prescribed Farxiga  but reported she was not taking this at home due to dizziness/shakiness. - Continue aspirin  81 mg daily. - Minimally elevated and flat trending troponin most consistent with demand/supply mismatch and not ACS in the setting of acute heart failure.  No plan for further cardiac diagnostics.   This patient's plan of care was discussed and created with Dr. Ammon and he is in agreement.  Signed: Danita Bloch, PA-C  07/09/2024, 8:42 AM [facility] Cardiology

## 2024-07-10 ENCOUNTER — Inpatient Hospital Stay

## 2024-07-10 DIAGNOSIS — I5023 Acute on chronic systolic (congestive) heart failure: Secondary | ICD-10-CM | POA: Diagnosis not present

## 2024-07-10 DIAGNOSIS — R252 Cramp and spasm: Secondary | ICD-10-CM

## 2024-07-10 LAB — GLUCOSE, CAPILLARY
Glucose-Capillary: 131 mg/dL — ABNORMAL HIGH (ref 70–99)
Glucose-Capillary: 122 mg/dL — ABNORMAL HIGH (ref 70–99)
Glucose-Capillary: 148 mg/dL — ABNORMAL HIGH (ref 70–99)
Glucose-Capillary: 115 mg/dL — ABNORMAL HIGH (ref 70–99)

## 2024-07-10 LAB — BASIC METABOLIC PANEL WITH GFR
Anion gap: 6 (ref 5–15)
BUN: 23 mg/dL (ref 8–23)
CO2: 32 mmol/L (ref 22–32)
Calcium: 8.7 mg/dL — ABNORMAL LOW (ref 8.9–10.3)
Chloride: 100 mmol/L (ref 98–111)
Creatinine, Ser: 1.08 mg/dL — ABNORMAL HIGH (ref 0.44–1.00)
GFR, Estimated: 58 mL/min — ABNORMAL LOW (ref >60)
Glucose, Bld: 96 mg/dL (ref 70–99)
Potassium: 3.9 mmol/L (ref 3.5–5.1)
Sodium: 138 mmol/L (ref 135–145)

## 2024-07-10 LAB — MAGNESIUM: Magnesium: 2 mg/dL (ref 1.7–2.4)

## 2024-07-10 MED ORDER — TORSEMIDE 20 MG PO TABS
20.0000 mg | ORAL_TABLET | Freq: Two times a day (BID) | ORAL | Status: DC
  Administered 2024-07-11: 20 mg via ORAL
  Filled 2024-07-10: qty 1

## 2024-07-10 MED ORDER — POTASSIUM CHLORIDE CRYS ER 10 MEQ PO TBCR: 10.0000 meq | EXTENDED_RELEASE_TABLET | Freq: Two times a day (BID) | ORAL | Status: DC | PRN

## 2024-07-10 MED ORDER — TRAMADOL HCL 50 MG PO TABS
50.0000 mg | ORAL_TABLET | Freq: Two times a day (BID) | ORAL | Status: AC | PRN
  Administered 2024-07-10: 50 mg via ORAL
  Filled 2024-07-10: qty 1

## 2024-07-10 MED ORDER — METHOCARBAMOL 500 MG PO TABS: 500.0000 mg | ORAL_TABLET | Freq: Three times a day (TID) | ORAL | Status: DC | PRN

## 2024-07-10 MED ORDER — SPIRONOLACTONE 25 MG PO TABS
25.0000 mg | ORAL_TABLET | Freq: Every day | ORAL | Status: DC
  Administered 2024-07-10 – 2024-07-11 (×2): 25 mg via ORAL
  Filled 2024-07-10 (×2): qty 1

## 2024-07-10 NOTE — Progress Notes (Signed)
 Heart Failure Stewardship Pharmacy Note  PCP: Diedra Lame, MD PCP-Cardiologist: None  HPI: Heather Choi is a 63 y.o. female with asthma/COPD, chronic HFrEF with LVEF less than 20%, HTN, IDDM who presented with shortness of breath, cough, wheezing, and fever. Tested positive for influenza A. On admission, BNP was 1144.2, HS troponin was 87, and lactic acid was 0.8. Chest x-ray 12/08/23 noted vascular congestion and basilar atelectasis or infiltrate.     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 Normal coronary anatomy noted in 2015. Echocardiogram in 11/2016 noted LVEF of 20-25%. LVEF improved to 35-30% in 10/2017 and was unchanged in 01/2019. Most recent echocardiogram in 07/2022 showed LVEF reduced again to <20% with grade III diastolic dysfunction, severely reduced RV function, severely dilated atria, and mild-moderate AR.   Pertinent Lab Values: Creatinine  Date Value Ref Range Status  09/01/2014 0.93 0.60 - 1.30 mg/dL Final   Creatinine, Ser  Date Value Ref Range Status  07/10/2024 1.08 (H) 0.44 - 1.00 mg/dL Final   BUN  Date Value Ref Range Status  07/10/2024 23 8 - 23 mg/dL Final  95/91/7974 15 8 - 27 mg/dL Final  87/98/7984 14 7 - 18 mg/dL Final   Potassium  Date Value Ref Range Status  07/10/2024 3.9 3.5 - 5.1 mmol/L Final  09/01/2014 4.0 3.5 - 5.1 mmol/L Final   Sodium  Date Value Ref Range Status  07/10/2024 138 135 - 145 mmol/L Final  01/08/2024 144 134 - 144 mmol/L Final  09/01/2014 143 136 - 145 mmol/L Final   B Natriuretic Peptide  Date Value Ref Range Status  07/07/2024 1,414.9 (H) 0.0 - 100.0 pg/mL Final    Comment:    Performed at [facility], [address] [place], [place] [address]   Magnesium   Date Value Ref Range Status  07/09/2024 2.1 1.7 - 2.4 mg/dL Final    Comment:    Performed at [facility], [address] [place], [place] [address]    Hgb A1c MFr Bld  Date Value Ref Range Status  07/08/2024 4.8 4.8 - 5.6 % Final    Comment:    (NOTE)         Prediabetes: 5.7 - 6.4         Diabetes: >6.4         Glycemic control for adults with diabetes: <7.0    Digoxin  Level  Date Value Ref Range Status  05/17/2020 0.4 (L) 0.8 - 2.0 ng/mL Final    Comment:    Performed at [facility], [address] [place], [place] [address]   TSH  Date Value Ref Range Status  07/07/2024 0.971 0.350 - 4.500 uIU/mL Final    Comment:    Performed by a 3rd Generation assay with a functional sensitivity of <=0.01 uIU/mL. Performed at [facility], [address] [place], [place] [address]     Vital Signs: Temp:  [97.6 F (36.4 C)-98.5 F (36.9 C)] 98.5 F (36.9 C) (10/09 1133) Pulse Rate:  [62-82] 69 (10/09 1133) Cardiac Rhythm: Normal sinus rhythm (10/09 0853) Resp:  [18] 18 (10/09 0429) BP: (93-110)/(58-76) 98/66 (10/09 1133) SpO2:  [91 %-98 %] 97 % (10/09 1133) Weight:  [64.3 kg (141 lb 12.1 oz)] 64.3 kg (141 lb 12.1 oz) (10/09 0500)  Intake/Output Summary (Last 24 hours) at 07/10/2024 1147 Last data filed at 07/10/2024 0431 Gross per 24 hour  Intake 120 ml  Output 1650  ml  Net -1530 ml    Current Heart Failure Medications:  Loop diuretic: furosemide  40 mg IV daily Beta-Blocker: carvedilol  12.5 mg BID ACEI/ARB/ARNI: Entresto  24-26 mg BID MRA: spironolactone  12.5 mg daily SGLT2i: Farxiga  10 mg daily Other: none  Prior to admission Heart Failure Medications:  Loop diuretic: torsemide  10 mg BID Beta-Blocker: carvedilol  12.5 mg BID ACEI/ARB/ARNI: Entresto  24-26 mg BID MRA: spironolactone  12.5 mg daily SGLT2i: Farxiga  10 mg daily Other: none  Assessment: 1. Acute on chronic combined systolic and diastolic heart failure (LVEF <20%) with grade III diastolic dysfunction, due to nonishchemic cardiomyopathy. NYHA class II-III symptoms.  -Symptoms: Reports shortness of breath is improved with O2 down to  1L. Orthopnea improved but present. Trace LEE present. Requiring O2. -Volume: Hypervolemic on exam. Reports good urine output on furosemide  40 mg IV BID. Creatinine improved today. Received furosemide  40 mg IV once this AM. Transition to torsemide  20 mg BID tomorrow -Hemodynamics: BP stable. HR 80s. -BB: Continue current dose of carvedilol  12.5 mg BID. Not currently decompensated, though would avoid titration until close to euvolemic. -ACEI/ARB/ARNI: Continue Entresto  24-26 mg BID. BP too soft to increase at this time. -MRA: Spironolactone  to 25 mg daily.   -SGLT2i: Continue Farxiga  10 mg daily.  Plan: 1) Medication changes recommended at this time: -Agree with increasing spironolactone  dose and transition to oral torsemide  tomorrow.  2) Patient assistance: -Pending  3) Education: - Patient has been educated on current HF medications and potential additions to HF medication regimen - Patient verbalizes understanding that over the next few months, these medication doses may change and more medications may be added to optimize HF regimen - Patient has been educated on basic disease state pathophysiology and goals of therapy  Medication Assistance / Insurance Benefits Check: Does the patient have prescription insurance?    Type of insurance plan:  Does the patient qualify for medication assistance through manufacturers or grants? Pending   Outpatient Pharmacy: Prior to admission outpatient pharmacy: [facility]      Please do not hesitate to reach out with questions or concerns,  Jaun Bash, PharmD, CPP, BCPS, [facility] Heart Failure Pharmacist  Phone - [phone] 07/10/2024 11:47 AM

## 2024-07-10 NOTE — Plan of Care (Signed)

## 2024-07-10 NOTE — TOC CM/SW Note (Signed)
 Transition of Care [facility]) CM/SW Note    Transition of Care [facility]) - Inpatient Brief Assessment   Patient Details  Name: Heather Choi MRN: [id number] Date of Birth: [birth year]  [facility] of Care [facility]) CM/SW Contact:    Alfonso Rummer, LCSW Phone Number: 07/10/2024, 10:44 AM   Clinical Narrative:  No TOC needs please contact should needs arise.   Transition of Care Asessment:

## 2024-07-10 NOTE — Progress Notes (Signed)
 [facility] : [facility] CLINIC CARDIOLOGY PROGRESS NOTE       Patient ID: [name] MRN: [id number] DOB/AGE: 03-07-1961 63 y.o.  Admit date: 07/08/2024 Referring Physician Dr. Delayne Solian Primary Physician Diedra Lame, MD  Primary Cardiologist Dr. Annalee Casa Reason for Consultation acute heart failure  HPI: Heather Heather Choi is a 63 y.o. 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 Cardiology was consulted for further evaluation.   Interval history: -Patient seen and examined, feeling ok overall today.  -SOB improved, weaned to 1 L. LE edema resolved.  -BP and HR stable today, one NSVT ~8 beats overnight.   Review of systems complete and found to be negative unless listed above    Past Medical History:  Diagnosis Date   Asthma    Bell's palsy    fingers tingling in cold weather   CHF (congestive heart failure) (HCC)    COPD (chronic obstructive pulmonary disease) (HCC)    Diabetes mellitus without complication (HCC)    Enlarged heart    Hypertension    Thyroid  disease     Past Surgical History:  Procedure Laterality Date   ABDOMINAL HYSTERECTOMY     CHOLECYSTECTOMY     IMPLANTABLE CARDIOVERTER DEFIBRILLATOR IMPLANT     laparoscopic bilateral oophorectomy with removal of adnexal mass and lysis of adhesions  11/20/2017   LAPAROSCOPY     with removal of adnexal structure    Medications Prior to Admission  Medication Sig Dispense Refill Last Dose/Taking   albuterol  (VENTOLIN  HFA) 108 (90 Base) MCG/ACT inhaler Inhale 2 puffs into the lungs every 6 (six) hours as needed  for wheezing or shortness of breath. 8 g 2 Unknown   ALPRAZolam  (XANAX ) 0.5 MG tablet Take 0.5 mg by mouth daily as needed for sleep.   07/07/2024   aspirin  EC 81 MG tablet Take 81 mg by mouth daily.    07/07/2024 Morning   Aspirin -Caffeine (BC ON THE GO) 845-65 MG PACK Take 1 Package by mouth daily as needed (headaches).   Unknown   carvedilol  (COREG ) 12.5 MG tablet Take 12.5 mg by mouth 2 (two) times daily with a meal.   07/07/2024 Morning   cetirizine (ZYRTEC) 10 MG tablet Take 10 mg by mouth daily as needed for allergies.   Unknown   ibuprofen (ADVIL) 600 MG tablet Take 600 mg by mouth every 6 (six) hours as needed for mild pain (pain score 1-3).   Unknown   levothyroxine  (SYNTHROID ) 137 MCG tablet Take 137 mcg by mouth daily.   07/07/2024 Morning   Liniments (SALONPAS PAIN RELIEF PATCH EX) Apply 1 patch topically daily as needed (pain).   Unknown   nicotine  (NICODERM CQ  - DOSED IN MG/24 HOURS) 14 mg/24hr patch Place 1 patch (14 mg total) onto the skin daily. 28 patch 0 Unknown   potassium chloride  SA (K-DUR,KLOR-CON ) 20 MEQ tablet Take 20 mEq by mouth 2 (two) times daily.    07/07/2024 Morning   sacubitril -valsartan  (ENTRESTO ) 24-26 MG Take 1 tablet by mouth 2 (two) times daily.   07/07/2024 Morning   spironolactone  (ALDACTONE ) 25 MG tablet Take 12.5 mg by mouth daily.   Unknown   torsemide  (DEMADEX )  10 MG tablet Take 2 tablets (20 mg total) by mouth at bedtime. (Patient taking differently: Take 10 mg by mouth 2 (two) times daily.)   Taking Differently   dapagliflozin  propanediol (FARXIGA ) 10 MG TABS tablet Take 1 tablet (10 mg total) by mouth daily before breakfast. (Patient not taking: Reported on 07/08/2024) 30 tablet 6 Not Taking   guaiFENesin  (MUCINEX ) 600 MG 12 hr tablet Take 1 tablet (600 mg total) by mouth 2 (two) times daily. (Patient not taking: Reported on 07/08/2024) 30 tablet 0 Not Taking   montelukast  (SINGULAIR ) 10 MG tablet Take 1 tablet by mouth at bedtime. (Patient not taking: Reported  on 07/08/2024)   Not Taking   Multiple Vitamin (MULTIVITAMIN WITH MINERALS) TABS tablet Take 1 tablet by mouth daily. (Patient not taking: Reported on 07/08/2024) 30 tablet 0 Not Taking   Social History   Socioeconomic History   Marital status: Single    Spouse name: Not on file   Number of children: Not on file   Years of education: Not on file   Highest education level: Not on file  Occupational History   Not on file  Tobacco Use   Smoking status: Every Day    Current packs/day: 0.50    Types: Cigarettes   Smokeless tobacco: Never  Vaping Use   Vaping status: Never Used  Substance and Sexual Activity   Alcohol use: Not Currently    Comment: rarely   Drug use: No   Sexual activity: Not Currently  Other Topics Concern   Not on file  Social History Narrative   Not on file   Social Drivers of Health   Financial Resource Strain: Patient Declined (03/17/2024)   Received from [facility]   Overall Financial Resource Strain (CARDIA)    Difficulty of Paying Living Expenses: Patient declined  Food Insecurity: No Food Insecurity (07/08/2024)   Hunger Vital Sign    Worried About Running Out of Food in the Last Year: Never true    Ran Out of Food in the Last Year: Never true  Transportation Needs: No Transportation Needs (07/08/2024)   PRAPARE - Administrator, Civil Service (Medical): No    Lack of Transportation (Non-Medical): No  Physical Activity: Not on file  Stress: Not on file  Social Connections: Not on file  Intimate Partner Violence: Not At Risk (07/08/2024)   Humiliation, Afraid, Rape, and Kick questionnaire    Fear of Current or Ex-Partner: No    Emotionally Abused: No    Physically Abused: No    Sexually Abused: No    Family History  Problem Relation Age of Onset   Heart failure Father    Heart attack Mother      Vitals:   07/10/24 0429 07/10/24 0431 07/10/24 0500 07/10/24 0731  BP: 110/76   108/74  Pulse: 79 82  71  Resp: 18      Temp: 98.4 F (36.9 C)   98.1 F (36.7 C)  TempSrc:    Oral  SpO2: 91% 98%  98%  Weight:   64.3 kg   Height:        PHYSICAL EXAM General: Chronically ill-appearing female, well nourished, in no acute distress. HEENT: Normocephalic and atraumatic. Neck: No JVD.  Lungs: Normal respiratory effort on 1L [place]. Clear bilaterally to auscultation. No wheezes, crackles, rhonchi.  Heart: HRRR. Normal S1 and S2 without gallops or murmurs.  Abdomen: Non-distended appearing.  Msk: Normal strength and tone for age. Extremities: Warm and  well perfused. No clubbing, cyanosis.  No edema.  Neuro: Alert and oriented X 3. Psych: Answers questions appropriately.   Labs: Basic Metabolic Panel: Recent Labs    07/09/24 0156 07/10/24 0251  NA 141 138  K 4.0 3.9  CL 99 100  CO2 31 32  GLUCOSE 122* 96  BUN 22 23  CREATININE 1.24* 1.08*  CALCIUM 8.9 8.7*  MG 2.1  --    Liver Function Tests: No results for input(s): AST, ALT, ALKPHOS, BILITOT, PROT, ALBUMIN in the last 72 hours. No results for input(s): LIPASE, AMYLASE in the last 72 hours. CBC: Recent Labs    07/07/24 1848 07/09/24 0156  WBC 4.8 4.5  HGB 15.4* 15.5*  HCT 48.6* 48.6*  MCV 90.3 89.0  PLT 119* 106*   Cardiac Enzymes: Recent Labs    07/07/24 1848 07/07/24 2034  TROPONINIHS 50* 50*   BNP: Recent Labs    07/07/24 1848  BNP 1,414.9*   D-Dimer: No results for input(s): DDIMER in the last 72 hours. Hemoglobin A1C: Recent Labs    07/08/24 0818  HGBA1C 4.8   Fasting Lipid Panel: No results for input(s): CHOL, HDL, LDLCALC, TRIG, CHOLHDL, LDLDIRECT in the last 72 hours. Thyroid  Function Tests: Recent Labs    07/07/24 1849  TSH 0.971   Anemia Panel: No results for input(s): VITAMINB12, FOLATE, FERRITIN, TIBC, IRON, RETICCTPCT in the last 72 hours.   Radiology: ECHOCARDIOGRAM COMPLETE Result Date: 07/08/2024    ECHOCARDIOGRAM REPORT   Patient Name:   JEMMA RASP  Date of Exam: 07/08/2024 Medical Rec #:  [id number]      Height:       62.0 in Accession #:    [id number]     Weight:       160.0 lb Date of Birth:  [birth year]       BSA:          1.739 m Patient Age:    63 years       BP:           104/60 mmHg Patient Gender: F              HR:           71 bpm. Exam Location:  [facility] Procedure: 2D Echo, Color Doppler and Cardiac Doppler (Both Spectral and Color            Flow Doppler were utilized during procedure). Indications:     CHF-Acute Systolic I50.21  History:         Patient has prior history of Echocardiogram examinations, most                  recent 07/21/2022. CHF; Defibrillator.  Sonographer:     Ashley McNeely-Sloane Referring Phys:  [id number] DELAYNE LULLA SOLIAN Diagnosing Phys: Marsa Dooms MD IMPRESSIONS  1. Left ventricular ejection fraction, by estimation, is <20%. The left ventricle has severely decreased function. The left ventricle demonstrates global hypokinesis. The left ventricular internal cavity size was moderately dilated. Left ventricular diastolic parameters were normal.  2. Right ventricular systolic function is normal. The right ventricular size is normal.  3. Left atrial size was moderately dilated.  4. The mitral valve is normal in structure. Moderate mitral valve regurgitation. No evidence of mitral stenosis.  5. The aortic valve is normal in structure. Aortic valve regurgitation is moderate. No aortic stenosis is present.  6. The inferior vena cava is normal in size with greater than 50% respiratory variability, suggesting right atrial  pressure of 3 mmHg. FINDINGS  Left Ventricle: Left ventricular ejection fraction, by estimation, is <20%. The left ventricle has severely decreased function. The left ventricle demonstrates global hypokinesis. Strain was performed and the global longitudinal strain is indeterminate.  The left ventricular internal cavity size was moderately dilated. There is no left ventricular hypertrophy. Left ventricular diastolic  parameters were normal. Right Ventricle: The right ventricular size is normal. No increase in right ventricular wall thickness. Right ventricular systolic function is normal. Left Atrium: Left atrial size was moderately dilated. Right Atrium: Right atrial size was normal in size. Pericardium: There is no evidence of pericardial effusion. Mitral Valve: The mitral valve is normal in structure. Moderate mitral valve regurgitation. No evidence of mitral valve stenosis. MV peak gradient, 3.7 mmHg. The mean mitral valve gradient is 2.0 mmHg. Tricuspid Valve: The tricuspid valve is normal in structure. Tricuspid valve regurgitation is not demonstrated. No evidence of tricuspid stenosis. Aortic Valve: The aortic valve is normal in structure. Aortic valve regurgitation is moderate. Aortic regurgitation PHT measures 486 msec. No aortic stenosis is present. Aortic valve mean gradient measures 3.0 mmHg. Aortic valve peak gradient measures 6.2 mmHg. Aortic valve area, by VTI measures 1.28 cm. Pulmonic Valve: The pulmonic valve was normal in structure. Pulmonic valve regurgitation is not visualized. No evidence of pulmonic stenosis. Aorta: The aortic root is normal in size and structure. Venous: The inferior vena cava is normal in size with greater than 50% respiratory variability, suggesting right atrial pressure of 3 mmHg. IAS/Shunts: No atrial level shunt detected by color flow Doppler. Additional Comments: 3D was performed not requiring image post processing on an independent workstation and was indeterminate.  LEFT VENTRICLE PLAX 2D LVIDd:         6.20 cm      Diastology LVIDs:         5.80 cm      LV e' medial:    3.99 cm/s LV PW:         1.40 cm      LV E/e' medial:  22.9 LV IVS:        1.00 cm      LV e' lateral:   3.81 cm/s LVOT diam:     2.10 cm      LV E/e' lateral: 23.9 LV SV:         28 LV SV Index:   16 LVOT Area:     3.46 cm  LV Volumes (MOD) LV vol d, MOD A2C: 131.0 ml LV vol d, MOD A4C: 157.0 ml LV vol s, MOD  A2C: 117.0 ml LV vol s, MOD A4C: 124.0 ml LV SV MOD A2C:     14.0 ml LV SV MOD A4C:     157.0 ml LV SV MOD BP:      27.6 ml RIGHT VENTRICLE            IVC RV Basal diam:  4.90 cm    IVC diam: 1.60 cm RV Mid diam:    3.80 cm RV S prime:     7.83 cm/s TAPSE (M-mode): 1.6 cm LEFT ATRIUM             Index        RIGHT ATRIUM           Index LA diam:        4.80 cm 2.76 cm/m   RA Area:     14.40 cm LA Vol (A2C):   68.3 ml 39.28 ml/m  RA  Volume:   28.20 ml  16.22 ml/m LA Vol (A4C):   84.0 ml 48.31 ml/m LA Biplane Vol: 83.0 ml 47.74 ml/m  AORTIC VALVE                    PULMONIC VALVE AV Area (Vmax):    1.67 cm     PV Vmax:        0.56 m/s AV Area (Vmean):   1.56 cm     PV Vmean:       39.300 cm/s AV Area (VTI):     1.28 cm     PV VTI:         0.107 m AV Vmax:           124.00 cm/s  PV Peak grad:   1.3 mmHg AV Vmean:          80.200 cm/s  PV Mean grad:   1.0 mmHg AV VTI:            0.216 m      RVOT Peak grad: 1 mmHg AV Peak Grad:      6.2 mmHg AV Mean Grad:      3.0 mmHg LVOT Vmax:         59.70 cm/s LVOT Vmean:        36.200 cm/s LVOT VTI:          0.080 m LVOT/AV VTI ratio: 0.37 AI PHT:            486 msec  AORTA Ao Root diam: 3.30 cm Ao Asc diam:  3.20 cm MITRAL VALVE                  TRICUSPID VALVE MV Area (PHT): 5.13 cm       TR Peak grad:   20.2 mmHg MV Area VTI:   0.90 cm       TR Mean grad:   17.0 mmHg MV Peak grad:  3.7 mmHg       TR Vmax:        225.00 cm/s MV Mean grad:  2.0 mmHg       TR Vmean:       203.0 cm/s MV Vmax:       0.96 m/s MV Vmean:      60.5 cm/s      SHUNTS MV Decel Time: 148 msec       Systemic VTI:  0.08 m MR Peak grad:    79.6 mmHg    Systemic Diam: 2.10 cm MR Mean grad:    55.0 mmHg    Pulmonic VTI:  0.087 m MR Vmax:         446.00 cm/s MR Vmean:        359.0 cm/s MR PISA:         3.08 cm MR PISA Eff ROA: 25 mm MR PISA Radius:  0.70 cm MV E velocity: 91.20 cm/s MV A velocity: 58.70 cm/s MV E/A ratio:  1.55 Marsa Dooms MD Electronically signed by Marsa Dooms MD  Signature Date/Time: 07/08/2024/4:47:36 PM    Final    DG Chest 2 View Result Date: 07/07/2024 CLINICAL DATA:  sob EXAM: CHEST - 2 VIEW COMPARISON:  December 10, 2023 FINDINGS: Coarsening of the pulmonary interstitium, possibly due to underlying emphysema. No focal airspace consolidation, pleural effusion, or pneumothorax. Moderate cardiomegaly. Left chest pacemaker/AICD with leads terminating in the right atrium and right ventricle. Tortuous aorta with aortic atherosclerosis. No acute fracture or destructive lesions. Multilevel thoracic  osteophytosis. Cholecystectomy clips. IMPRESSION: Moderate cardiomegaly.  No pneumonia or pulmonary edema. Electronically Signed   By: Rogelia Myers M.D.   On: 07/07/2024 19:37    ECHO as above  TELEMETRY reviewed by me 07/10/2024: sinus rhythm rate 70s  EKG reviewed by me: sinus rhythm rate 85 bpm  Data reviewed by me 07/10/2024: last 24h vitals tele labs imaging I/O ED provider note, admission H&P, hospitalist progress note  Principal Problem:   Acute on chronic HFrEF (heart failure with reduced ejection fraction) (HCC) Active Problems:   COPD (chronic obstructive pulmonary disease) (HCC)   Benign essential hypertension   Insulin  dependent type 2 diabetes mellitus (HCC)   Acute respiratory failure with hypoxia (HCC)   Acute exacerbation of CHF (congestive heart failure) (HCC)    ASSESSMENT AND PLAN:  Heather Heather Choi is a 63 y.o. 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 Cardiology was consulted for further evaluation.   # Acute on chronic  HFrEF # Non-ischemic cardiomyopathy # S/p ICD # Hypertension Patient presented with 2 weeks of worsening shortness of breath and lower extremity edema.  BNP elevated at 1500.  Troponins mild and flat at 50 > 50.  Started on IV Lasix . Echo with EF <20% (stable), global hypokinesis, moderate LV dilation, normal RV size and function, moderate MR. - Transition to PO torsemide  20 mg bid tomorrow.  - Increase spironolactone  to 25 mg daily. Continue carvedilol  12.5 mg twice daily, Entresto  24-26 mg twice daily.   - Continue aspirin  81 mg daily. - Minimally elevated and flat trending troponin most consistent with demand/supply mismatch and not ACS in the setting of acute heart failure.  No plan for further cardiac diagnostics.   This patient's plan of care was discussed and created with Dr. Ammon and he is in agreement.  Signed: Danita Bloch, PA-C  07/10/2024, 9:51 AM [facility] Cardiology

## 2024-07-10 NOTE — Assessment & Plan Note (Signed)
 Check magnesium  level Patient states that whenever she gets this way, she takes potassium tablets at home.  Potassium chloride  10 mill equivalents p.o. twice daily as needed for muscle cramps, 2 days ordered Check ultrasound of the lower extremity to assess for DVT

## 2024-07-10 NOTE — Progress Notes (Signed)
 PROGRESS NOTE    Heather Choi  FMW:[id number] DOB: 08-22-1961 DOA: 07/08/2024 PCP: Heather Lame, MD   No notes on file  Brief Narrative:   Heather Choi is a 63 y.o. female with medical history significant for COPD, chronic HFrEF with LVEF less than 20% s/p AICD, HTN, IDDM being admitted with acute CHF exacerbation and acute hypoxic respiratory failure requiring 2 L who presents with worsening edema, orthopnea, dyspnea with exertion.   Assessment & Plan:   Principal Problem:   Acute on chronic HFrEF (heart failure with reduced ejection fraction) (HCC) Active Problems:   Leg cramping   Acute respiratory failure with hypoxia (HCC)   COPD (chronic obstructive pulmonary disease) (HCC)   Insulin  dependent type 2 diabetes mellitus (HCC)   Benign essential hypertension   Acute exacerbation of CHF (congestive heart failure) (HCC)   Assessment and Plan:  * Acute on chronic HFrEF (heart failure with reduced ejection fraction) (HCC) S/p AICD Acute respiratory failure with hypoxia IV Lasix  Continue home GDMT Echocardiogram Daily weights with intake and output monitoring Supplemental oxygen and wean as tolerated 07/10/2024: Cardiology consult: Transitioning to torsemide  20 mg p.o. twice daily per cardiology team Continue strict I/Os  Leg cramping Check magnesium  level Patient states that whenever she gets this way, she takes potassium tablets at home.  Potassium chloride  10 mill equivalents p.o. twice daily as needed for muscle cramps, 2 days ordered Check ultrasound of the lower extremity to assess for DVT  COPD (chronic obstructive pulmonary disease) (HCC) Not currently exacerbated Continue home inhalers with DuoNebs as needed  Insulin  dependent type 2 diabetes mellitus (HCC) Sliding scale insulin  coverage  Benign essential hypertension Continue home meds  DVT prophylaxis: Enoxaparin  Code Status: Full code Family Communication: Updated family at bedside with  patient's permission, niece to [place] and Sister Vina Disposition Plan:  Level of care: Progressive  Consultants:  Cardiology  Procedures:  None at this time  Antimicrobials: Indicated  Subjective: Patient states she overall feels better.  She just has leg cramping today.  She states this usually happens when she is low in potassium and she will take potassium supplements at home.  Objective: Vitals:   07/10/24 0431 07/10/24 0500 07/10/24 0731 07/10/24 1133  BP:   108/74 98/66  Pulse: 82  71 69  Resp:      Temp:   98.1 F (36.7 C) 98.5 F (36.9 C)  TempSrc:   Oral Oral  SpO2: 98%  98% 97%  Weight:  64.3 kg    Height:        Intake/Output Summary (Last 24 hours) at 07/10/2024 1437 Last data filed at 07/10/2024 0431 Gross per 24 hour  Intake 120 ml  Output 1000 ml  Net -880 ml   Filed Weights   07/07/24 1847 07/09/24 0500 07/10/24 0500  Weight: 72.6 kg 63.7 kg 64.3 kg   Examination:  General exam: Appears calm and comfortable  Respiratory system: Clear to auscultation. Respiratory effort normal. Cardiovascular system: S1 & S2 heard, RRR. No JVD, murmurs, rubs, gallops or clicks. No pedal edema. Gastrointestinal system: Abdomen is nondistended, soft and nontender. No organomegaly or masses felt. Normal bowel sounds heard. Central nervous system: Alert and oriented. No focal neurological deficits. Extremities: Symmetric 5 x 5 power.  Bilateral calf tenderness with squeezing Skin: No rashes, lesions or ulcers Psychiatry: Judgement and insight appear normal. Mood & affect appropriate.   Data Reviewed: I have personally reviewed following labs and imaging studies  CBC: Recent Labs  Lab 07/07/24 1848 07/09/24 0156  WBC 4.8 4.5  HGB 15.4* 15.5*  HCT 48.6* 48.6*  MCV 90.3 89.0  PLT 119* 106*   Basic Metabolic Panel: Recent Labs  Lab 07/07/24 1848 07/08/24 1728 07/09/24 0156 07/10/24 0251  NA 141 139 141 138  K 4.4 4.0 4.0 3.9  CL 104 100 99 100  CO2 28  29 31  32  GLUCOSE 109* 97 122* 96  BUN 17 16 22 23   CREATININE 1.01* 1.04* 1.24* 1.08*  CALCIUM 8.8* 8.7* 8.9 8.7*  MG  --   --  2.1  --    GFR: Estimated Creatinine Clearance: 47.6 mL/min (A) (by C-G formula based on SCr of 1.08 mg/dL (H)).  HbA1C: Recent Labs    07/08/24 0818  HGBA1C 4.8   CBG: Recent Labs  Lab 07/09/24 1243 07/09/24 1623 07/09/24 2027 07/10/24 0801 07/10/24 1134  GLUCAP 111* 118* 127* 131* 122*   Thyroid  Function Tests: Recent Labs    07/07/24 1849  TSH 0.971  FREET4 1.28*   No results found for this or any previous visit (from the past 240 hours).   Radiology Studies: ECHOCARDIOGRAM COMPLETE Result Date: 07/08/2024    ECHOCARDIOGRAM REPORT   Patient Name:   [name] Date of Exam: 07/08/2024 Medical Rec #:  [id number]      Height:       62.0 in Accession #:    [id number]     Weight:       160.0 lb Date of Birth:  October 12, 1960       BSA:          1.739 m Patient Age:    63 years       BP:           104/60 mmHg Patient Gender: F              HR:           71 bpm. Exam Location:  [facility] Procedure: 2D Echo, Color Doppler and Cardiac Doppler (Both Spectral and Color            Flow Doppler were utilized during procedure). Indications:     CHF-Acute Systolic I50.21  History:         Patient has prior history of Echocardiogram examinations, most                  recent 07/21/2022. CHF; Defibrillator.  Sonographer:     Heather Choi Referring Phys:  [id number] Heather Choi Diagnosing Phys: Heather Dooms MD IMPRESSIONS  1. Left ventricular ejection fraction, by estimation, is <20%. The left ventricle has severely decreased function. The left ventricle demonstrates global hypokinesis. The left ventricular internal cavity size was moderately dilated. Left ventricular diastolic parameters were normal.  2. Right ventricular systolic function is normal. The right ventricular size is normal.  3. Left atrial size was moderately dilated.  4. The mitral valve is  normal in structure. Moderate mitral valve regurgitation. No evidence of mitral stenosis.  5. The aortic valve is normal in structure. Aortic valve regurgitation is moderate. No aortic stenosis is present.  6. The inferior vena cava is normal in size with greater than 50% respiratory variability, suggesting right atrial pressure of 3 mmHg. FINDINGS  Left Ventricle: Left ventricular ejection fraction, by estimation, is <20%. The left ventricle has severely decreased function. The left ventricle demonstrates global hypokinesis. Strain was performed and the global longitudinal strain is indeterminate.  The left ventricular internal cavity size was  moderately dilated. There is no left ventricular hypertrophy. Left ventricular diastolic parameters were normal. Right Ventricle: The right ventricular size is normal. No increase in right ventricular wall thickness. Right ventricular systolic function is normal. Left Atrium: Left atrial size was moderately dilated. Right Atrium: Right atrial size was normal in size. Pericardium: There is no evidence of pericardial effusion. Mitral Valve: The mitral valve is normal in structure. Moderate mitral valve regurgitation. No evidence of mitral valve stenosis. MV peak gradient, 3.7 mmHg. The mean mitral valve gradient is 2.0 mmHg. Tricuspid Valve: The tricuspid valve is normal in structure. Tricuspid valve regurgitation is not demonstrated. No evidence of tricuspid stenosis. Aortic Valve: The aortic valve is normal in structure. Aortic valve regurgitation is moderate. Aortic regurgitation PHT measures 486 msec. No aortic stenosis is present. Aortic valve mean gradient measures 3.0 mmHg. Aortic valve peak gradient measures 6.2 mmHg. Aortic valve area, by VTI measures 1.28 cm. Pulmonic Valve: The pulmonic valve was normal in structure. Pulmonic valve regurgitation is not visualized. No evidence of pulmonic stenosis. Aorta: The aortic root is normal in size and structure. Venous: The  inferior vena cava is normal in size with greater than 50% respiratory variability, suggesting right atrial pressure of 3 mmHg. IAS/Shunts: No atrial level shunt detected by color flow Doppler. Additional Comments: 3D was performed not requiring image post processing on an independent workstation and was indeterminate.  LEFT VENTRICLE PLAX 2D LVIDd:         6.20 cm      Diastology LVIDs:         5.80 cm      LV e' medial:    3.99 cm/s LV PW:         1.40 cm      LV E/e' medial:  22.9 LV IVS:        1.00 cm      LV e' lateral:   3.81 cm/s LVOT diam:     2.10 cm      LV E/e' lateral: 23.9 LV SV:         28 LV SV Index:   16 LVOT Area:     3.46 cm  LV Volumes (MOD) LV vol d, MOD A2C: 131.0 ml LV vol d, MOD A4C: 157.0 ml LV vol s, MOD A2C: 117.0 ml LV vol s, MOD A4C: 124.0 ml LV SV MOD A2C:     14.0 ml LV SV MOD A4C:     157.0 ml LV SV MOD BP:      27.6 ml RIGHT VENTRICLE            IVC RV Basal diam:  4.90 cm    IVC diam: 1.60 cm RV Mid diam:    3.80 cm RV S prime:     7.83 cm/s TAPSE (M-mode): 1.6 cm LEFT ATRIUM             Index        RIGHT ATRIUM           Index LA diam:        4.80 cm 2.76 cm/m   RA Area:     14.40 cm LA Vol (A2C):   68.3 ml 39.28 ml/m  RA Volume:   28.20 ml  16.22 ml/m LA Vol (A4C):   84.0 ml 48.31 ml/m LA Biplane Vol: 83.0 ml 47.74 ml/m  AORTIC VALVE                    PULMONIC VALVE  AV Area (Vmax):    1.67 cm     PV Vmax:        0.56 m/s AV Area (Vmean):   1.56 cm     PV Vmean:       39.300 cm/s AV Area (VTI):     1.28 cm     PV VTI:         0.107 m AV Vmax:           124.00 cm/s  PV Peak grad:   1.3 mmHg AV Vmean:          80.200 cm/s  PV Mean grad:   1.0 mmHg AV VTI:            0.216 m      RVOT Peak grad: 1 mmHg AV Peak Grad:      6.2 mmHg AV Mean Grad:      3.0 mmHg LVOT Vmax:         59.70 cm/s LVOT Vmean:        36.200 cm/s LVOT VTI:          0.080 m LVOT/AV VTI ratio: 0.37 AI PHT:            486 msec  AORTA Ao Root diam: 3.30 cm Ao Asc diam:  3.20 cm MITRAL VALVE                   TRICUSPID VALVE MV Area (PHT): 5.13 cm       TR Peak grad:   20.2 mmHg MV Area VTI:   0.90 cm       TR Mean grad:   17.0 mmHg MV Peak grad:  3.7 mmHg       TR Vmax:        225.00 cm/s MV Mean grad:  2.0 mmHg       TR Vmean:       203.0 cm/s MV Vmax:       0.96 m/s MV Vmean:      60.5 cm/s      SHUNTS MV Decel Time: 148 msec       Systemic VTI:  0.08 m MR Peak grad:    79.6 mmHg    Systemic Diam: 2.10 cm MR Mean grad:    55.0 mmHg    Pulmonic VTI:  0.087 m MR Vmax:         446.00 cm/s MR Vmean:        359.0 cm/s MR PISA:         3.08 cm MR PISA Eff ROA: 25 mm MR PISA Radius:  0.70 cm MV E velocity: 91.20 cm/s MV A velocity: 58.70 cm/s MV E/A ratio:  1.55 Heather Dooms MD Electronically signed by Heather Dooms MD Signature Date/Time: 07/08/2024/4:47:36 PM    Final    Scheduled Meds:  aspirin  EC  81 mg Oral Daily   carvedilol   12.5 mg Oral BID WC   dapagliflozin  propanediol  10 mg Oral QAC breakfast   enoxaparin  (LOVENOX ) injection  40 mg Subcutaneous Q24H   insulin  aspart  0-15 Units Subcutaneous TID WC   insulin  aspart  0-5 Units Subcutaneous QHS   levothyroxine   125 mcg Oral Daily   lidocaine  1 patch Transdermal Q24H   sacubitril -valsartan   1 tablet Oral BID   spironolactone   25 mg Oral Daily   [START ON 07/11/2024] torsemide   20 mg Oral BID   Continuous Infusions:   LOS: 2 days   Time spent: 75  Dr. Sherre [facility]   If 7PM-7AM, please contact night-coverage  07/10/2024, 2:37 PM

## 2024-07-11 ENCOUNTER — Other Ambulatory Visit: Payer: Self-pay

## 2024-07-11 DIAGNOSIS — I5023 Acute on chronic systolic (congestive) heart failure: Secondary | ICD-10-CM | POA: Diagnosis not present

## 2024-07-11 LAB — BASIC METABOLIC PANEL WITH GFR
Anion gap: 15 (ref 5–15)
BUN: 27 mg/dL — ABNORMAL HIGH (ref 8–23)
CO2: 27 mmol/L (ref 22–32)
Calcium: 8.9 mg/dL (ref 8.9–10.3)
Chloride: 99 mmol/L (ref 98–111)
Creatinine, Ser: 1.2 mg/dL — ABNORMAL HIGH (ref 0.44–1.00)
GFR, Estimated: 51 mL/min — ABNORMAL LOW (ref >60)
Glucose, Bld: 109 mg/dL — ABNORMAL HIGH (ref 70–99)
Potassium: 4 mmol/L (ref 3.5–5.1)
Sodium: 141 mmol/L (ref 135–145)

## 2024-07-11 LAB — CBC
HCT: 48.7 % — ABNORMAL HIGH (ref 36.0–46.0)
Hemoglobin: 16 g/dL — ABNORMAL HIGH (ref 12.0–15.0)
MCH: 28.8 pg (ref 26.0–34.0)
MCHC: 32.9 g/dL (ref 30.0–36.0)
MCV: 87.7 fL (ref 80.0–100.0)
Platelets: 114 K/uL — ABNORMAL LOW (ref 150–400)
RBC: 5.55 MIL/uL — ABNORMAL HIGH (ref 3.87–5.11)
RDW: 15.4 % (ref 11.5–15.5)
WBC: 5.1 K/uL (ref 4.0–10.5)
nRBC: 0 % (ref 0.0–0.2)

## 2024-07-11 LAB — GLUCOSE, CAPILLARY
Glucose-Capillary: 127 mg/dL — ABNORMAL HIGH (ref 70–99)
Glucose-Capillary: 109 mg/dL — ABNORMAL HIGH (ref 70–99)

## 2024-07-11 MED ORDER — SPIRONOLACTONE 25 MG PO TABS
25.0000 mg | ORAL_TABLET | Freq: Every day | ORAL | 1 refills | Status: AC
  Filled 2024-07-11: qty 30, 30d supply, fill #0

## 2024-07-11 MED ORDER — DAPAGLIFLOZIN PROPANEDIOL 10 MG PO TABS
10.0000 mg | ORAL_TABLET | Freq: Every day | ORAL | 0 refills | Status: AC
  Filled 2024-07-11: qty 30, 30d supply, fill #0

## 2024-07-11 MED ORDER — HYDROCODONE-ACETAMINOPHEN 5-325 MG PO TABS
1.0000 | ORAL_TABLET | ORAL | 0 refills | Status: AC | PRN
  Filled 2024-07-11: qty 15, 2d supply, fill #0

## 2024-07-11 MED ORDER — LEVOTHYROXINE SODIUM 125 MCG PO TABS
125.0000 ug | ORAL_TABLET | Freq: Every day | ORAL | 1 refills | Status: AC
  Filled 2024-07-11: qty 30, 30d supply, fill #0

## 2024-07-11 MED ORDER — TORSEMIDE 20 MG PO TABS
20.0000 mg | ORAL_TABLET | Freq: Two times a day (BID) | ORAL | 1 refills | Status: AC
  Filled 2024-07-11: qty 60, 30d supply, fill #0

## 2024-07-11 MED ORDER — CARVEDILOL 3.125 MG PO TABS
3.1250 mg | ORAL_TABLET | Freq: Two times a day (BID) | ORAL | 0 refills | Status: AC
  Filled 2024-07-11: qty 60, 30d supply, fill #0

## 2024-07-11 MED ORDER — LIDOCAINE 5 % EX PTCH
1.0000 | MEDICATED_PATCH | CUTANEOUS | 0 refills | Status: AC
  Filled 2024-07-11: qty 30, 30d supply, fill #0

## 2024-07-11 NOTE — Progress Notes (Signed)
 [facility] CLINIC CARDIOLOGY PROGRESS NOTE       Patient ID: Heather Choi MRN: [id number] DOB/AGE: Mar 04, 1961 63 y.o.  Admit date: 07/08/2024 Referring Physician Dr. Delayne Solian Primary Physician Diedra Lame, MD  Primary Cardiologist Dr. Annalee Casa Reason for Consultation acute heart failure  HPI: Heather Choi is a 63 y.o. 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 Cardiology was consulted for further evaluation.   Interval history: -Patient seen and examined, feeling ok overall today.  -SOB improved, still on 1 L O2 today. LE edema resolved.  -BP and HR stable today, no significant events on telemetry.  Review of systems complete and found to be negative unless listed above    Past Medical History:  Diagnosis Date   Asthma    Bell's palsy    fingers tingling in cold weather   CHF (congestive heart failure) (HCC)    COPD (chronic obstructive pulmonary disease) (HCC)    Diabetes mellitus without complication (HCC)    Enlarged heart    Hypertension    Thyroid  disease     Past Surgical History:  Procedure Laterality Date   ABDOMINAL HYSTERECTOMY     CHOLECYSTECTOMY     IMPLANTABLE CARDIOVERTER DEFIBRILLATOR IMPLANT     laparoscopic bilateral oophorectomy with removal of adnexal mass and lysis of adhesions  11/20/2017   LAPAROSCOPY     with removal of adnexal structure    Medications Prior to Admission  Medication Sig Dispense Refill Last Dose/Taking   albuterol  (VENTOLIN  HFA) 108 (90 Base) MCG/ACT inhaler Inhale 2 puffs into the lungs every 6 (six)  hours as needed for wheezing or shortness of breath. 8 g 2 Unknown   ALPRAZolam  (XANAX ) 0.5 MG tablet Take 0.5 mg by mouth daily as needed for sleep.   07/07/2024   aspirin  EC 81 MG tablet Take 81 mg by mouth daily.    07/07/2024 Morning   Aspirin -Caffeine (BC ON THE GO) 845-65 MG PACK Take 1 Package by mouth daily as needed (headaches).   Unknown   carvedilol  (COREG ) 12.5 MG tablet Take 12.5 mg by mouth 2 (two) times daily with a meal.   07/07/2024 Morning   cetirizine (ZYRTEC) 10 MG tablet Take 10 mg by mouth daily as needed for allergies.   Unknown   ibuprofen (ADVIL) 600 MG tablet Take 600 mg by mouth every 6 (six) hours as needed for mild pain (pain score 1-3).   Unknown   levothyroxine  (SYNTHROID ) 137 MCG tablet Take 137 mcg by mouth daily.   07/07/2024 Morning   Liniments (SALONPAS PAIN RELIEF PATCH EX) Apply 1 patch topically daily as needed (pain).   Unknown   nicotine  (NICODERM CQ  - DOSED IN MG/24 HOURS) 14 mg/24hr patch Place 1 patch (14 mg total) onto the skin daily. 28 patch 0 Unknown   potassium chloride  SA (K-DUR,KLOR-CON ) 20 MEQ tablet Take 20 mEq by mouth 2 (two) times daily.    07/07/2024 Morning   sacubitril -valsartan  (ENTRESTO ) 24-26 MG Take 1 tablet by mouth 2 (two) times daily.   07/07/2024 Morning   spironolactone  (ALDACTONE ) 25 MG tablet Take 12.5 mg by mouth daily.   Unknown   torsemide  (  DEMADEX ) 10 MG tablet Take 2 tablets (20 mg total) by mouth at bedtime. (Patient taking differently: Take 10 mg by mouth 2 (two) times daily.)   Taking Differently   dapagliflozin  propanediol (FARXIGA ) 10 MG TABS tablet Take 1 tablet (10 mg total) by mouth daily before breakfast. (Patient not taking: Reported on 07/08/2024) 30 tablet 6 Not Taking   guaiFENesin  (MUCINEX ) 600 MG 12 hr tablet Take 1 tablet (600 mg total) by mouth 2 (two) times daily. (Patient not taking: Reported on 07/08/2024) 30 tablet 0 Not Taking   montelukast  (SINGULAIR ) 10 MG tablet Take 1 tablet by mouth at bedtime. (Patient not  taking: Reported on 07/08/2024)   Not Taking   Multiple Vitamin (MULTIVITAMIN WITH MINERALS) TABS tablet Take 1 tablet by mouth daily. (Patient not taking: Reported on 07/08/2024) 30 tablet 0 Not Taking   Social History   Socioeconomic History   Marital status: Single    Spouse name: Not on file   Number of children: Not on file   Years of education: Not on file   Highest education level: Not on file  Occupational History   Not on file  Tobacco Use   Smoking status: Every Day    Current packs/day: 0.50    Types: Cigarettes   Smokeless tobacco: Never  Vaping Use   Vaping status: Never Used  Substance and Sexual Activity   Alcohol use: Not Currently    Comment: rarely   Drug use: No   Sexual activity: Not Currently  Other Topics Concern   Not on file  Social History Narrative   Not on file   Social Drivers of Health   Financial Resource Strain: Patient Declined (03/17/2024)   Received from [facility]   Overall Financial Resource Strain (CARDIA)    Difficulty of Paying Living Expenses: Patient declined  Food Insecurity: No Food Insecurity (07/08/2024)   Hunger Vital Sign    Worried About Running Out of Food in the Last Year: Never true    Ran Out of Food in the Last Year: Never true  Transportation Needs: No Transportation Needs (07/08/2024)   PRAPARE - Administrator, Civil Service (Medical): No    Lack of Transportation (Non-Medical): No  Physical Activity: Not on file  Stress: Not on file  Social Connections: Not on file  Intimate Partner Violence: Not At Risk (07/08/2024)   Humiliation, Afraid, Rape, and Kick questionnaire    Fear of Current or Ex-Partner: No    Emotionally Abused: No    Physically Abused: No    Sexually Abused: No    Family History  Problem Relation Age of Onset   Heart failure Father    Heart attack Mother      Vitals:   07/10/24 2351 07/11/24 0359 07/11/24 0500 07/11/24 0800  BP: 100/65 106/63    Pulse: 69 65   69  Resp: 18 18    Temp: 98.2 F (36.8 C) 97.7 F (36.5 C)  98.3 F (36.8 C)  TempSrc: Oral Oral  Oral  SpO2: 91% 92% 94% 96%  Weight:   66.3 kg   Height:        PHYSICAL EXAM General: Chronically ill-appearing female, well nourished, in no acute distress. HEENT: Normocephalic and atraumatic. Neck: No JVD.  Lungs: Normal respiratory effort on 1L [place]. Clear bilaterally to auscultation. No wheezes, crackles, rhonchi.  Heart: HRRR. Normal S1 and S2 without gallops or murmurs.  Abdomen: Non-distended appearing.  Msk: Normal strength and tone for  age. Extremities: Warm and well perfused. No clubbing, cyanosis.  No edema.  Neuro: Alert and oriented X 3. Psych: Answers questions appropriately.   Labs: Basic Metabolic Panel: Recent Labs    07/09/24 0156 07/10/24 0251 07/10/24 1400 07/11/24 0543  NA 141 138  --  141  K 4.0 3.9  --  4.0  CL 99 100  --  99  CO2 31 32  --  27  GLUCOSE 122* 96  --  109*  BUN 22 23  --  27*  CREATININE 1.24* 1.08*  --  1.20*  CALCIUM 8.9 8.7*  --  8.9  MG 2.1  --  2.0  --    Liver Function Tests: No results for input(s): AST, ALT, ALKPHOS, BILITOT, PROT, ALBUMIN in the last 72 hours. No results for input(s): LIPASE, AMYLASE in the last 72 hours. CBC: Recent Labs    07/09/24 0156 07/11/24 0543  WBC 4.5 5.1  HGB 15.5* 16.0*  HCT 48.6* 48.7*  MCV 89.0 87.7  PLT 106* 114*   Cardiac Enzymes: No results for input(s): CKTOTAL, CKMB, CKMBINDEX, TROPONINIHS in the last 72 hours.  BNP: No results for input(s): BNP in the last 72 hours.  D-Dimer: No results for input(s): DDIMER in the last 72 hours. Hemoglobin A1C: No results for input(s): HGBA1C in the last 72 hours.  Fasting Lipid Panel: No results for input(s): CHOL, HDL, LDLCALC, TRIG, CHOLHDL, LDLDIRECT in the last 72 hours. Thyroid  Function Tests: No results for input(s): TSH, T4TOTAL, T3FREE, THYROIDAB in the last 72  hours.  Invalid input(s): FREET3  Anemia Panel: No results for input(s): VITAMINB12, FOLATE, FERRITIN, TIBC, IRON, RETICCTPCT in the last 72 hours.   Radiology: ECHOCARDIOGRAM COMPLETE Result Date: 07/08/2024    ECHOCARDIOGRAM REPORT   Patient Name:   Heather Choi Date of Exam: 07/08/2024 Medical Rec #:  [id number]      Height:       62.0 in Accession #:    [id number]     Weight:       160.0 lb Date of Birth:  02/08/1961       BSA:          1.739 m Patient Age:    63 years       BP:           104/60 mmHg Patient Gender: F              HR:           71 bpm. Exam Location:  [facility] Procedure: 2D Echo, Color Doppler and Cardiac Doppler (Both Spectral and Color            Flow Doppler were utilized during procedure). Indications:     CHF-Acute Systolic I50.21  History:         Patient has prior history of Echocardiogram examinations, most                  recent 07/21/2022. CHF; Defibrillator.  Sonographer:     Ashley McNeely-Sloane Referring Phys:  [id number] DELAYNE LULLA SOLIAN Diagnosing Phys: Marsa Dooms MD IMPRESSIONS  1. Left ventricular ejection fraction, by estimation, is <20%. The left ventricle has severely decreased function. The left ventricle demonstrates global hypokinesis. The left ventricular internal cavity size was moderately dilated. Left ventricular diastolic parameters were normal.  2. Right ventricular systolic function is normal. The right ventricular size is normal.  3. Left atrial size was moderately dilated.  4. The mitral valve is normal in structure.  Moderate mitral valve regurgitation. No evidence of mitral stenosis.  5. The aortic valve is normal in structure. Aortic valve regurgitation is moderate. No aortic stenosis is present.  6. The inferior vena cava is normal in size with greater than 50% respiratory variability, suggesting right atrial pressure of 3 mmHg. FINDINGS  Left Ventricle: Left ventricular ejection fraction, by estimation, is <20%. The left ventricle has  severely decreased function. The left ventricle demonstrates global hypokinesis. Strain was performed and the global longitudinal strain is indeterminate.  The left ventricular internal cavity size was moderately dilated. There is no left ventricular hypertrophy. Left ventricular diastolic parameters were normal. Right Ventricle: The right ventricular size is normal. No increase in right ventricular wall thickness. Right ventricular systolic function is normal. Left Atrium: Left atrial size was moderately dilated. Right Atrium: Right atrial size was normal in size. Pericardium: There is no evidence of pericardial effusion. Mitral Valve: The mitral valve is normal in structure. Moderate mitral valve regurgitation. No evidence of mitral valve stenosis. MV peak gradient, 3.7 mmHg. The mean mitral valve gradient is 2.0 mmHg. Tricuspid Valve: The tricuspid valve is normal in structure. Tricuspid valve regurgitation is not demonstrated. No evidence of tricuspid stenosis. Aortic Valve: The aortic valve is normal in structure. Aortic valve regurgitation is moderate. Aortic regurgitation PHT measures 486 msec. No aortic stenosis is present. Aortic valve mean gradient measures 3.0 mmHg. Aortic valve peak gradient measures 6.2 mmHg. Aortic valve area, by VTI measures 1.28 cm. Pulmonic Valve: The pulmonic valve was normal in structure. Pulmonic valve regurgitation is not visualized. No evidence of pulmonic stenosis. Aorta: The aortic root is normal in size and structure. Venous: The inferior vena cava is normal in size with greater than 50% respiratory variability, suggesting right atrial pressure of 3 mmHg. IAS/Shunts: No atrial level shunt detected by color flow Doppler. Additional Comments: 3D was performed not requiring image post processing on an independent workstation and was indeterminate.  LEFT VENTRICLE PLAX 2D LVIDd:         6.20 cm      Diastology LVIDs:         5.80 cm      LV e' medial:    3.99 cm/s LV PW:          1.40 cm      LV E/e' medial:  22.9 LV IVS:        1.00 cm      LV e' lateral:   3.81 cm/s LVOT diam:     2.10 cm      LV E/e' lateral: 23.9 LV SV:         28 LV SV Index:   16 LVOT Area:     3.46 cm  LV Volumes (MOD) LV vol d, MOD A2C: 131.0 ml LV vol d, MOD A4C: 157.0 ml LV vol s, MOD A2C: 117.0 ml LV vol s, MOD A4C: 124.0 ml LV SV MOD A2C:     14.0 ml LV SV MOD A4C:     157.0 ml LV SV MOD BP:      27.6 ml RIGHT VENTRICLE            IVC RV Basal diam:  4.90 cm    IVC diam: 1.60 cm RV Mid diam:    3.80 cm RV S prime:     7.83 cm/s TAPSE (M-mode): 1.6 cm LEFT ATRIUM             Index  RIGHT ATRIUM           Index LA diam:        4.80 cm 2.76 cm/m   RA Area:     14.40 cm LA Vol (A2C):   68.3 ml 39.28 ml/m  RA Volume:   28.20 ml  16.22 ml/m LA Vol (A4C):   84.0 ml 48.31 ml/m LA Biplane Vol: 83.0 ml 47.74 ml/m  AORTIC VALVE                    PULMONIC VALVE AV Area (Vmax):    1.67 cm     PV Vmax:        0.56 m/s AV Area (Vmean):   1.56 cm     PV Vmean:       39.300 cm/s AV Area (VTI):     1.28 cm     PV VTI:         0.107 m AV Vmax:           124.00 cm/s  PV Peak grad:   1.3 mmHg AV Vmean:          80.200 cm/s  PV Mean grad:   1.0 mmHg AV VTI:            0.216 m      RVOT Peak grad: 1 mmHg AV Peak Grad:      6.2 mmHg AV Mean Grad:      3.0 mmHg LVOT Vmax:         59.70 cm/s LVOT Vmean:        36.200 cm/s LVOT VTI:          0.080 m LVOT/AV VTI ratio: 0.37 AI PHT:            486 msec  AORTA Ao Root diam: 3.30 cm Ao Asc diam:  3.20 cm MITRAL VALVE                  TRICUSPID VALVE MV Area (PHT): 5.13 cm       TR Peak grad:   20.2 mmHg MV Area VTI:   0.90 cm       TR Mean grad:   17.0 mmHg MV Peak grad:  3.7 mmHg       TR Vmax:        225.00 cm/s MV Mean grad:  2.0 mmHg       TR Vmean:       203.0 cm/s MV Vmax:       0.96 m/s MV Vmean:      60.5 cm/s      SHUNTS MV Decel Time: 148 msec       Systemic VTI:  0.08 m MR Peak grad:    79.6 mmHg    Systemic Diam: 2.10 cm MR Mean grad:    55.0 mmHg    Pulmonic  VTI:  0.087 m MR Vmax:         446.00 cm/s MR Vmean:        359.0 cm/s MR PISA:         3.08 cm MR PISA Eff ROA: 25 mm MR PISA Radius:  0.70 cm MV E velocity: 91.20 cm/s MV A velocity: 58.70 cm/s MV E/A ratio:  1.55 Marsa Dooms MD Electronically signed by Marsa Dooms MD Signature Date/Time: 07/08/2024/4:47:36 PM    Final    DG Chest 2 View Result Date: 07/07/2024 CLINICAL DATA:  sob EXAM: CHEST - 2 VIEW COMPARISON:  December 10, 2023  FINDINGS: Coarsening of the pulmonary interstitium, possibly due to underlying emphysema. No focal airspace consolidation, pleural effusion, or pneumothorax. Moderate cardiomegaly. Left chest pacemaker/AICD with leads terminating in the right atrium and right ventricle. Tortuous aorta with aortic atherosclerosis. No acute fracture or destructive lesions. Multilevel thoracic osteophytosis. Cholecystectomy clips. IMPRESSION: Moderate cardiomegaly.  No pneumonia or pulmonary edema. Electronically Signed   By: Rogelia Myers M.D.   On: 07/07/2024 19:37    ECHO as above  TELEMETRY reviewed by me 07/11/2024: sinus rhythm rate 70s  EKG reviewed by me: sinus rhythm rate 85 bpm  Data reviewed by me 07/11/2024: last 24h vitals tele labs imaging I/O ED provider note, admission H&P, hospitalist progress note  Principal Problem:   Acute on chronic HFrEF (heart failure with reduced ejection fraction) (HCC) Active Problems:   COPD (chronic obstructive pulmonary disease) (HCC)   Benign essential hypertension   Insulin  dependent type 2 diabetes mellitus (HCC)   Acute respiratory failure with hypoxia (HCC)   Acute exacerbation of CHF (congestive heart failure) (HCC)   Leg cramping    ASSESSMENT AND PLAN:  Heather Choi is a 63 y.o. 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 Cardiology was consulted for further evaluation.   # Acute on chronic HFrEF # Non-ischemic cardiomyopathy # S/p ICD # Hypertension Patient presented with 2 weeks of worsening shortness of breath and lower extremity edema.  BNP elevated at 1500.  Troponins mild and flat at 50 > 50.  Started on IV Lasix . Echo with EF <20% (stable), global hypokinesis, moderate LV dilation, normal RV size and function, moderate MR. - Transition to PO torsemide  20 mg bid today.  - Continue spironolactone  to 25 mg daily. Continue carvedilol  12.5 mg twice daily, Entresto  24-26 mg twice daily, Farxiga  10 mg daily.   - Continue aspirin  81 mg daily. - Minimally elevated and flat trending troponin most consistent with demand/supply mismatch and not ACS in the setting of acute heart failure.  No plan for further cardiac diagnostics.   This patient's plan of care was discussed and created with Dr. Ammon and he is in agreement.  Signed: Danita Bloch, PA-C  07/11/2024, 8:21 AM [facility]

## 2024-07-11 NOTE — Discharge Summary (Signed)
 Physician Discharge Summary   Patient: Heather Choi MRN: [id number] DOB: June 29, 1961  Admit date:     07/08/2024  Discharge date: 07/11/24  Discharge Physician: Greig SAILOR Zanya Lindo   PCP: Diedra Lame, MD   Recommendations at discharge:   Follow-up with PCP and cardiologist outpatient as appropriate Do not drink more than 45 ounce of water per day Take home medication as prescribed  Discharge Diagnoses: Principal Problem:   Acute on chronic HFrEF (heart failure with reduced ejection fraction) (HCC) Active Problems:   Leg cramping   Acute respiratory failure with hypoxia (HCC)   COPD (chronic obstructive pulmonary disease) (HCC)   Insulin  dependent type 2 diabetes mellitus (HCC)   Benign essential hypertension   Acute exacerbation of CHF (congestive heart failure) (HCC)  Resolved Problems:   * No resolved hospital problems. [facility] Course:  Please see H&P and daily progress notes and staff note for full hospital course.  Briefly:  Heather Choi is a 63 y.o. female with medical history significant for COPD, chronic HFrEF with LVEF less than 20% s/p AICD, HTN, IDDM being admitted with acute CHF exacerbation and acute hypoxic respiratory failure requiring 2 L who presents with worsening edema, orthopnea, dyspnea with exertion.  Patient was diuresed with IV medication.  Cardiology team was consulted.  Strict I's and O's were measured.  Cardiology has transition patient to torsemide  20 mg p.o. twice daily and recommends low-dose Coreg  twice daily to be resumed given patient's low EF.  This has been discussed with patient on discharge.  Medications have been  written for discharge.  Time was spent to answer patient's questions and family's question at bedside.  Patient and family are in agreement with patient going home today for discharge  Assessment and Plan:  * Acute on chronic HFrEF (heart failure with reduced ejection fraction) (HCC) S/p AICD Acute respiratory failure with  hypoxia IV Lasix  Continue home GDMT Echocardiogram Daily weights with intake and output monitoring Supplemental oxygen and wean as tolerated 07/10/2024: Cardiology consult: Transitioning to torsemide  20 mg p.o. twice daily per cardiology team Continue strict I/Os  Leg cramping Check magnesium  level Patient states that whenever she gets this way, she takes potassium tablets at home.  Potassium chloride  10 mill equivalents p.o. twice daily as needed for muscle cramps, 2 days ordered Check ultrasound of the lower extremity to assess for DVT  COPD (chronic obstructive pulmonary disease) (HCC) Not currently exacerbated Continue home inhalers with DuoNebs as needed  Insulin  dependent type 2 diabetes mellitus (HCC) Sliding scale insulin  coverage  Benign essential hypertension Continue home meds  Pain control - [place]  Controlled Substance Reporting System database was reviewed. and patient was instructed, not to drive, operate heavy machinery, perform activities at heights, swimming or participation in water activities or provide baby-sitting services while on Pain, Sleep and Anxiety Medications; until their outpatient Physician has advised to do so again. Also recommended to not to take more than prescribed Pain, Sleep and Anxiety Medications.   Consultants: Cardiology Procedures performed: None Disposition: Home Diet recommendation:  Cardiac diet DISCHARGE MEDICATION: Allergies as of 07/11/2024       Reactions   Clindamycin /lincomycin    Burning sensastion   Amlodipine Other (See Comments), Itching   Flonase  [fluticasone ] Other (See Comments)   irratation   Penicillins Other (See Comments)   Has patient had a PCN reaction causing immediate rash, facial/tongue/throat swelling, SOB or lightheadedness with hypotension: Yes Has patient had a PCN reaction causing severe rash involving  mucus membranes or skin necrosis: No Has patient had a PCN reaction that required  hospitalization: No Has patient had a PCN reaction occurring within the last 10 years: Yes If all of the above answers are NO, then may proceed with Cephalosporin use.   Hydrochlorothiazide Rash        Medication List     STOP taking these medications    multivitamin with minerals Tabs tablet   SALONPAS PAIN RELIEF PATCH EX       TAKE these medications    albuterol  108 (90 Base) MCG/ACT inhaler Commonly known as: VENTOLIN  HFA Inhale 2 puffs into the lungs every 6 (six) hours as needed for wheezing or shortness of breath.   ALPRAZolam  0.5 MG tablet Commonly known as: XANAX  Take 0.5 mg by mouth daily as needed for sleep.   aspirin  EC 81 MG tablet Take 81 mg by mouth daily.   BC On The Go 845-65 MG Pack Generic drug: Aspirin -Caffeine Take 1 Package by mouth daily as needed (headaches).   carvedilol  3.125 MG tablet Commonly known as: Coreg  Take 1 tablet (3.125 mg total) by mouth 2 (two) times daily. What changed:  medication strength how much to take when to take this   cetirizine 10 MG tablet Commonly known as: ZYRTEC Take 10 mg by mouth daily as needed for allergies.   dapagliflozin  propanediol 10 MG Tabs tablet Commonly known as: Farxiga  Take 1 tablet (10 mg total) by mouth daily before breakfast. Start taking on: July 12, 2024   Entresto  24-26 MG Generic drug: sacubitril -valsartan  Take 1 tablet by mouth 2 (two) times daily.   guaiFENesin  600 MG 12 hr tablet Commonly known as: MUCINEX  Take 1 tablet (600 mg total) by mouth 2 (two) times daily.   HYDROcodone -acetaminophen  5-325 MG tablet Commonly known as: NORCO/VICODIN Take 1-2 tablets by mouth every 4 (four) hours as needed for up to 3 days for moderate pain (pain score 4-6).   ibuprofen 600 MG tablet Commonly known as: ADVIL Take 600 mg by mouth every 6 (six) hours as needed for mild pain (pain score 1-3).   levothyroxine  125 MCG tablet Commonly known as: SYNTHROID  Take 1 tablet (125 mcg  total) by mouth daily. Start taking on: July 12, 2024 What changed:  medication strength how much to take   lidocaine 5 % Commonly known as: LIDODERM Place 1 patch onto the skin daily. Remove & Discard patch within 12 hours or as directed by MD Start taking on: July 12, 2024   montelukast  10 MG tablet Commonly known as: SINGULAIR  Take 1 tablet by mouth at bedtime.   nicotine  14 mg/24hr patch Commonly known as: NICODERM CQ  - dosed in mg/24 hours Place 1 patch (14 mg total) onto the skin daily.   potassium chloride  SA 20 MEQ tablet Commonly known as: KLOR-CON  M Take 20 mEq by mouth 2 (two) times daily.   spironolactone  25 MG tablet Commonly known as: ALDACTONE  Take 1 tablet (25 mg total) by mouth daily. Start taking on: July 12, 2024 What changed: how much to take   torsemide  20 MG tablet Commonly known as: DEMADEX  Take 1 tablet (20 mg total) by mouth 2 (two) times daily. What changed:  medication strength when to take this        Follow-up Information     Custovic, Sabina, DO. Go in 2 week(s).   Specialty: Cardiology Why: Appointment scheduled for 10/29 at 3:30 PM Contact information: [address] [place] [address] [phone]  [facility]. Go on 07/22/2024.   Specialty: Cardiology Why: Hospital Follow-Up 07/22/24 @ 9:00 Please bring all medications to follow-up appointment [facility], [address] Second Floor Free Valet Parking at the door Contact information: [address] [place]  [address] [phone]               Discharge Exam: Fredricka Weights   07/09/24 0500 07/10/24 0500 07/11/24 0500  Weight: 63.7 kg 64.3 kg 66.3 kg   Condition at discharge: good  The results of significant diagnostics from this hospitalization (including imaging, microbiology, ancillary and laboratory) are listed below for reference.   Imaging  Studies: US  Venous Img Lower Bilateral (DVT) Result Date: 07/11/2024 CLINICAL DATA:  Bilateral lower extremity pain EXAM: BILATERAL LOWER EXTREMITY VENOUS DOPPLER ULTRASOUND TECHNIQUE: Gray-scale sonography with graded compression, as well as color Doppler and duplex ultrasound were performed to evaluate the lower extremity deep venous systems from the level of the common femoral vein and including the common femoral, femoral, profunda femoral, popliteal and calf veins including the posterior tibial, peroneal and gastrocnemius veins when visible. The superficial great saphenous vein was also interrogated. Spectral Doppler was utilized to evaluate flow at rest and with distal augmentation maneuvers in the common femoral, femoral and popliteal veins. COMPARISON:  None Available. FINDINGS: RIGHT LOWER EXTREMITY Common Femoral Vein: No evidence of thrombus. Normal compressibility, respiratory phasicity and response to augmentation. Saphenofemoral Junction: No evidence of thrombus. Normal compressibility and flow on color Doppler imaging. Profunda Femoral Vein: No evidence of thrombus. Normal compressibility and flow on color Doppler imaging. Femoral Vein: No evidence of thrombus. Normal compressibility, respiratory phasicity and response to augmentation. Popliteal Vein: No evidence of thrombus. Normal compressibility, respiratory phasicity and response to augmentation. Calf Veins: No evidence of thrombus. Normal compressibility and flow on color Doppler imaging. Superficial Great Saphenous Vein: No evidence of thrombus. Normal compressibility. Venous Reflux:  None. Other Findings:  None. LEFT LOWER EXTREMITY Common Femoral Vein: No evidence of thrombus. Normal compressibility, respiratory phasicity and response to augmentation. Saphenofemoral Junction: No evidence of thrombus. Normal compressibility and flow on color Doppler imaging. Profunda Femoral Vein: No evidence of thrombus. Normal compressibility and flow on  color Doppler imaging. Femoral Vein: No evidence of thrombus. Normal compressibility, respiratory phasicity and response to augmentation. Popliteal Vein: No evidence of thrombus. Normal compressibility, respiratory phasicity and response to augmentation. Calf Veins: No evidence of thrombus. Normal compressibility and flow on color Doppler imaging. Superficial Great Saphenous Vein: No evidence of thrombus. Normal compressibility. Venous Reflux:  None. Other Findings:  None. IMPRESSION: No evidence of deep venous thrombosis in either lower extremity. Electronically Signed   By: Wilkie Lent M.D.   On: 07/11/2024 08:23   ECHOCARDIOGRAM COMPLETE Result Date: 07/08/2024    ECHOCARDIOGRAM REPORT   Patient Name:   Heather Choi Date of Exam: 07/08/2024 Medical Rec #:  [id number]      Height:       62.0 in Accession #:    [id number]     Weight:       160.0 lb Date of Birth:  1961/09/22       BSA:          1.739 m Patient Age:    63 years       BP:           104/60 mmHg Patient Gender: F              HR:  71 bpm. Exam Location:  [facility] Procedure: 2D Echo, Color Doppler and Cardiac Doppler (Both Spectral and Color            Flow Doppler were utilized during procedure). Indications:     CHF-Acute Systolic I50.21  History:         Patient has prior history of Echocardiogram examinations, most                  recent 07/21/2022. CHF; Defibrillator.  Sonographer:     Ashley McNeely-Sloane Referring Phys:  [id number] Heather Choi Diagnosing Phys: Marsa Dooms MD IMPRESSIONS  1. Left ventricular ejection fraction, by estimation, is <20%. The left ventricle has severely decreased function. The left ventricle demonstrates global hypokinesis. The left ventricular internal cavity size was moderately dilated. Left ventricular diastolic parameters were normal.  2. Right ventricular systolic function is normal. The right ventricular size is normal.  3. Left atrial size was moderately dilated.  4. The mitral valve is  normal in structure. Moderate mitral valve regurgitation. No evidence of mitral stenosis.  5. The aortic valve is normal in structure. Aortic valve regurgitation is moderate. No aortic stenosis is present.  6. The inferior vena cava is normal in size with greater than 50% respiratory variability, suggesting right atrial pressure of 3 mmHg. FINDINGS  Left Ventricle: Left ventricular ejection fraction, by estimation, is <20%. The left ventricle has severely decreased function. The left ventricle demonstrates global hypokinesis. Strain was performed and the global longitudinal strain is indeterminate.  The left ventricular internal cavity size was moderately dilated. There is no left ventricular hypertrophy. Left ventricular diastolic parameters were normal. Right Ventricle: The right ventricular size is normal. No increase in right ventricular wall thickness. Right ventricular systolic function is normal. Left Atrium: Left atrial size was moderately dilated. Right Atrium: Right atrial size was normal in size. Pericardium: There is no evidence of pericardial effusion. Mitral Valve: The mitral valve is normal in structure. Moderate mitral valve regurgitation. No evidence of mitral valve stenosis. MV peak gradient, 3.7 mmHg. The mean mitral valve gradient is 2.0 mmHg. Tricuspid Valve: The tricuspid valve is normal in structure. Tricuspid valve regurgitation is not demonstrated. No evidence of tricuspid stenosis. Aortic Valve: The aortic valve is normal in structure. Aortic valve regurgitation is moderate. Aortic regurgitation PHT measures 486 msec. No aortic stenosis is present. Aortic valve mean gradient measures 3.0 mmHg. Aortic valve peak gradient measures 6.2 mmHg. Aortic valve area, by VTI measures 1.28 cm. Pulmonic Valve: The pulmonic valve was normal in structure. Pulmonic valve regurgitation is not visualized. No evidence of pulmonic stenosis. Aorta: The aortic root is normal in size and structure. Venous: The  inferior vena cava is normal in size with greater than 50% respiratory variability, suggesting right atrial pressure of 3 mmHg. IAS/Shunts: No atrial level shunt detected by color flow Doppler. Additional Comments: 3D was performed not requiring image post processing on an independent workstation and was indeterminate.  LEFT VENTRICLE PLAX 2D LVIDd:         6.20 cm      Diastology LVIDs:         5.80 cm      LV e' medial:    3.99 cm/s LV PW:         1.40 cm      LV E/e' medial:  22.9 LV IVS:        1.00 cm      LV e' lateral:   3.81 cm/s LVOT diam:  2.10 cm      LV E/e' lateral: 23.9 LV SV:         28 LV SV Index:   16 LVOT Area:     3.46 cm  LV Volumes (MOD) LV vol d, MOD A2C: 131.0 ml LV vol d, MOD A4C: 157.0 ml LV vol s, MOD A2C: 117.0 ml LV vol s, MOD A4C: 124.0 ml LV SV MOD A2C:     14.0 ml LV SV MOD A4C:     157.0 ml LV SV MOD BP:      27.6 ml RIGHT VENTRICLE            IVC RV Basal diam:  4.90 cm    IVC diam: 1.60 cm RV Mid diam:    3.80 cm RV S prime:     7.83 cm/s TAPSE (M-mode): 1.6 cm LEFT ATRIUM             Index        RIGHT ATRIUM           Index LA diam:        4.80 cm 2.76 cm/m   RA Area:     14.40 cm LA Vol (A2C):   68.3 ml 39.28 ml/m  RA Volume:   28.20 ml  16.22 ml/m LA Vol (A4C):   84.0 ml 48.31 ml/m LA Biplane Vol: 83.0 ml 47.74 ml/m  AORTIC VALVE                    PULMONIC VALVE AV Area (Vmax):    1.67 cm     PV Vmax:        0.56 m/s AV Area (Vmean):   1.56 cm     PV Vmean:       39.300 cm/s AV Area (VTI):     1.28 cm     PV VTI:         0.107 m AV Vmax:           124.00 cm/s  PV Peak grad:   1.3 mmHg AV Vmean:          80.200 cm/s  PV Mean grad:   1.0 mmHg AV VTI:            0.216 m      RVOT Peak grad: 1 mmHg AV Peak Grad:      6.2 mmHg AV Mean Grad:      3.0 mmHg LVOT Vmax:         59.70 cm/s LVOT Vmean:        36.200 cm/s LVOT VTI:          0.080 m LVOT/AV VTI ratio: 0.37 AI PHT:            486 msec  AORTA Ao Root diam: 3.30 cm Ao Asc diam:  3.20 cm MITRAL VALVE                   TRICUSPID VALVE MV Area (PHT): 5.13 cm       TR Peak grad:   20.2 mmHg MV Area VTI:   0.90 cm       TR Mean grad:   17.0 mmHg MV Peak grad:  3.7 mmHg       TR Vmax:        225.00 cm/s MV Mean grad:  2.0 mmHg       TR Vmean:       203.0 cm/s MV Vmax:  0.96 m/s MV Vmean:      60.5 cm/s      SHUNTS MV Decel Time: 148 msec       Systemic VTI:  0.08 m MR Peak grad:    79.6 mmHg    Systemic Diam: 2.10 cm MR Mean grad:    55.0 mmHg    Pulmonic VTI:  0.087 m MR Vmax:         446.00 cm/s MR Vmean:        359.0 cm/s MR PISA:         3.08 cm MR PISA Eff ROA: 25 mm MR PISA Radius:  0.70 cm MV E velocity: 91.20 cm/s MV A velocity: 58.70 cm/s MV E/A ratio:  1.55 Marsa Dooms MD Electronically signed by Marsa Dooms MD Signature Date/Time: 07/08/2024/4:47:36 PM    Final    DG Chest 2 View Result Date: 07/07/2024 CLINICAL DATA:  sob EXAM: CHEST - 2 VIEW COMPARISON:  December 10, 2023 FINDINGS: Coarsening of the pulmonary interstitium, possibly due to underlying emphysema. No focal airspace consolidation, pleural effusion, or pneumothorax. Moderate cardiomegaly. Left chest pacemaker/AICD with leads terminating in the right atrium and right ventricle. Tortuous aorta with aortic atherosclerosis. No acute fracture or destructive lesions. Multilevel thoracic osteophytosis. Cholecystectomy clips. IMPRESSION: Moderate cardiomegaly.  No pneumonia or pulmonary edema. Electronically Signed   By: Rogelia Myers M.D.   On: 07/07/2024 19:37    Microbiology: Results for orders placed or performed during the hospital encounter of 12/07/23  Blood Culture (routine x 2)     Status: None   Collection Time: 12/07/23  1:31 PM   Specimen: BLOOD  Result Value Ref Range Status   Specimen Description BLOOD BLOOD RIGHT HAND  Final   Special Requests   Final    BOTTLES DRAWN AEROBIC AND ANAEROBIC Blood Culture results may not be optimal due to an inadequate volume of blood received in culture bottles   Culture   Final     NO GROWTH 5 DAYS Performed at [facility], [address] [place], [place] [address]    Report Status 12/12/2023 FINAL  Final  Resp panel by RT-PCR (RSV, Flu A&B, Covid) Anterior Nasal Swab     Status: Abnormal   Collection Time: 12/07/23  1:31 PM   Specimen: Anterior Nasal Swab  Result Value Ref Range Status   SARS Coronavirus 2 by RT PCR NEGATIVE NEGATIVE Final    Comment: (NOTE) SARS-CoV-2 target nucleic acids are NOT DETECTED.  The SARS-CoV-2 RNA is generally detectable in upper respiratory specimens during the acute phase of infection. The lowest concentration of SARS-CoV-2 viral copies this assay can detect is 138 copies/mL. A negative result does not preclude SARS-Cov-2 infection and should not be used as the sole basis for treatment or other patient management decisions. A negative result may occur with  improper specimen collection/handling, submission of specimen other than nasopharyngeal swab, presence of viral mutation(s) within the areas targeted by this assay, and inadequate number of viral copies(<138 copies/mL). A negative result must be combined with clinical observations, patient history, and epidemiological information. The expected result is Negative.  Fact Sheet for Patients:  BloggerCourse.com  Fact Sheet for Healthcare Providers:  SeriousBroker.it  This test is no t yet approved or cleared by the [place]  FDA and  has been authorized for detection and/or diagnosis of SARS-CoV-2 by FDA under an Emergency Use Authorization (EUA). This EUA will remain  in effect (meaning this test can be used) for the duration of  the COVID-19 declaration under Section 564(b)(1) of the Act, 21 U.S.C.section 360bbb-3(b)(1), unless the authorization is terminated  or revoked sooner.       Influenza A by PCR POSITIVE (A) NEGATIVE Final   Influenza B by PCR NEGATIVE NEGATIVE Final    Comment: (NOTE) The Xpert  Xpress SARS-CoV-2/FLU/RSV plus assay is intended as an aid in the diagnosis of influenza from Nasopharyngeal swab specimens and should not be used as a sole basis for treatment. Nasal washings and aspirates are unacceptable for Xpert Xpress SARS-CoV-2/FLU/RSV testing.  Fact Sheet for Patients: BloggerCourse.com  Fact Sheet for Healthcare Providers: SeriousBroker.it  This test is not yet approved or cleared by the [place]  FDA and has been authorized for detection and/or diagnosis of SARS-CoV-2 by FDA under an Emergency Use Authorization (EUA). This EUA will remain in effect (meaning this test can be used) for the duration of the COVID-19 declaration under Section 564(b)(1) of the Act, 21 U.S.C. section 360bbb-3(b)(1), unless the authorization is terminated or revoked.     Resp Syncytial Virus by PCR NEGATIVE NEGATIVE Final    Comment: (NOTE) Fact Sheet for Patients: BloggerCourse.com  Fact Sheet for Healthcare Providers: SeriousBroker.it  This test is not yet approved or cleared by the [place]  FDA and has been authorized for detection and/or diagnosis of SARS-CoV-2 by FDA under an Emergency Use Authorization (EUA). This EUA will remain in effect (meaning this test can be used) for the duration of the COVID-19 declaration under Section 564(b)(1) of the Act, 21 U.S.C. section 360bbb-3(b)(1), unless the authorization is terminated or revoked.  Performed at [facility], [address] [place], [place] [address]   Blood Culture (routine x 2)     Status: None   Collection Time: 12/07/23  1:33 PM   Specimen: BLOOD  Result Value Ref Range Status   Specimen Description BLOOD LEFT ANTECUBITAL  Final   Special Requests   Final    BOTTLES DRAWN AEROBIC AND ANAEROBIC Blood Culture results may not be optimal due to an inadequate volume of blood received in culture  bottles   Culture   Final    NO GROWTH 5 DAYS Performed at [facility], [address] [place], [place] [address]    Report Status 12/12/2023 FINAL  Final   Labs: CBC: Recent Labs  Lab 07/07/24 1848 07/09/24 0156 07/11/24 0543  WBC 4.8 4.5 5.1  HGB 15.4* 15.5* 16.0*  HCT 48.6* 48.6* 48.7*  MCV 90.3 89.0 87.7  PLT 119* 106* 114*   Basic Metabolic Panel: Recent Labs  Lab 07/07/24 1848 07/08/24 1728 07/09/24 0156 07/10/24 0251 07/10/24 1400 07/11/24 0543  NA 141 139 141 138  --  141  K 4.4 4.0 4.0 3.9  --  4.0  CL 104 100 99 100  --  99  CO2 28 29 31  32  --  27  GLUCOSE 109* 97 122* 96  --  109*  BUN 17 16 22 23   --  27*  CREATININE 1.01* 1.04* 1.24* 1.08*  --  1.20*  CALCIUM 8.8* 8.7* 8.9 8.7*  --  8.9  MG  --   --  2.1  --  2.0  --    CBG: Recent Labs  Lab 07/10/24 1134 07/10/24 1645 07/10/24 2101 07/11/24 0805 07/11/24 1125  GLUCAP 122* 148* 115* 127* 109*   Discharge time spent: greater than 30 minutes.  Signed: Dr. Sherre [facility] 07/11/2024

## 2024-07-11 NOTE — Progress Notes (Signed)
 Heart Failure Stewardship Pharmacy Note  PCP: Diedra Lame, MD PCP-Cardiologist: None  HPI: Heather Choi is a 63 y.o. female with asthma/COPD, chronic HFrEF with LVEF less than 20%, HTN, IDDM who presented with shortness of breath, cough, wheezing, and fever. Tested positive for influenza A. On admission, BNP was 1144.2, HS troponin was 87, and lactic acid was 0.8. Chest x-ray 12/08/23 noted vascular congestion and basilar atelectasis or infiltrate.     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 Normal coronary anatomy noted in 2015. Echocardiogram in 11/2016 noted LVEF of 20-25%. LVEF improved to 35-30% in 10/2017 and was unchanged in 01/2019. Most recent echocardiogram in 07/2022 showed LVEF reduced again to <20% with grade III diastolic dysfunction, severely reduced RV function, severely dilated atria, and mild-moderate AR.   Pertinent Lab Values: Creatinine  Date Value Ref Range Status  09/01/2014 0.93 0.60 - 1.30 mg/dL Final   Creatinine, Ser  Date Value Ref Range Status  07/11/2024 1.20 (H) 0.44 - 1.00 mg/dL Final   BUN  Date Value Ref Range Status  07/11/2024 27 (H) 8 - 23 mg/dL Final  95/91/7974 15 8 - 27 mg/dL Final  87/98/7984 14 7 - 18 mg/dL Final   Potassium  Date Value Ref Range Status  07/11/2024 4.0 3.5 - 5.1 mmol/L Final  09/01/2014 4.0 3.5 - 5.1 mmol/L Final   Sodium  Date Value Ref Range Status  07/11/2024 141 135 - 145 mmol/L Final  01/08/2024 144 134 - 144 mmol/L Final  09/01/2014 143 136 - 145 mmol/L Final   B Natriuretic Peptide  Date Value Ref Range Status  07/07/2024 1,414.9 (H) 0.0 - 100.0 pg/mL Final    Comment:    Performed at [facility], [address] [place], [place] [address]   Magnesium   Date Value Ref Range Status  07/10/2024 2.0 1.7 - 2.4 mg/dL Final    Comment:    HEMOLYSIS AT THIS LEVEL MAY AFFECT RESULT Performed at [facility],  [address] [place], [place] [address]    Hgb A1c MFr Bld  Date Value Ref Range Status  07/08/2024 4.8 4.8 - 5.6 % Final    Comment:    (NOTE)         Prediabetes: 5.7 - 6.4         Diabetes: >6.4         Glycemic control for adults with diabetes: <7.0    Digoxin  Level  Date Value Ref Range Status  05/17/2020 0.4 (L) 0.8 - 2.0 ng/mL Final    Comment:    Performed at [facility], [address] [place], [place] [address]   TSH  Date Value Ref Range Status  07/07/2024 0.971 0.350 - 4.500 uIU/mL Final    Comment:    Performed by a 3rd Generation assay with a functional sensitivity of <=0.01 uIU/mL. Performed at [facility], [address] [place], [place] [address]     Vital Signs: Temp:  [97.4 F (36.3 C)-98.5 F (36.9 C)] 98.3 F (36.8 C) (10/10 0800) Pulse Rate:  [65-74] 69 (10/10 0800) Cardiac Rhythm: Normal sinus rhythm (10/10 0810) Resp:  [18] 18 (10/10 0359) BP: (90-106)/(56-82) 92/65 (10/10 0800) SpO2:  [91 %-97 %] 96 % (10/10 0800) Weight:  [66.3 kg (146 lb 2.6 oz)] 66.3 kg (146 lb 2.6 oz) (10/10 0500)  Intake/Output Summary (Last 24 hours) at 07/11/2024 0933 Last data filed at 07/10/2024 1911 Gross per  24 hour  Intake 240 ml  Output --  Net 240 ml    Current Heart Failure Medications:  Loop diuretic: torsemide  20 mg BID Beta-Blocker: none ACEI/ARB/ARNI: Entresto  24-26 mg BID MRA: spironolactone  25 mg daily SGLT2i: Farxiga  10 mg daily (patient refuses 2/2 rash) Other: none  Prior to admission Heart Failure Medications:  Loop diuretic: torsemide  10 mg BID Beta-Blocker: carvedilol  12.5 mg BID ACEI/ARB/ARNI: Entresto  24-26 mg BID MRA: spironolactone  12.5 mg daily SGLT2i: Farxiga  10 mg daily Other: none  Assessment: 1. Acute on chronic combined systolic and diastolic heart failure (LVEF <20%) with grade III diastolic dysfunction, due to nonishchemic cardiomyopathy. NYHA class II-III symptoms.  -Symptoms: Reports shortness  of breath is improved with O2 at 1L only overnight. Orthopnea improved to baseline. No notable LEE present.   -Volume: Euvolemic on exam. Creatinine bumped today. Transition to torsemide  20 mg BID  -Hemodynamics: BP stable. HR 80s. -BB: Previously on carvedilol  12.5 mg BID, though stopped yesterday. Would strongly suggest adding back BB before discharge.  -ACEI/ARB/ARNI: Continue Entresto  24-26 mg BID. BP too soft to increase at this time. -MRA: Spironolactone  to 25 mg daily.   -SGLT2i: Continue Farxiga  10 mg daily.  Plan: 1) Medication changes recommended at this time: -Consider adding back BB prior to discharge. If concerned for BP could reduce carvedilol  dose or transition to a less vasoactive BB (metoprolol or bisoprolol)  2) Patient assistance: -Pending  3) Education: - Patient has been educated on current HF medications and potential additions to HF medication regimen - Patient verbalizes understanding that over the next few months, these medication doses may change and more medications may be added to optimize HF regimen - Patient has been educated on basic disease state pathophysiology and goals of therapy  Medication Assistance / Insurance Benefits Check: Does the patient have prescription insurance?    Type of insurance plan:  Does the patient qualify for medication assistance through manufacturers or grants? Pending   Outpatient Pharmacy: Prior to admission outpatient pharmacy: [facility]      Please do not hesitate to reach out with questions or concerns,  Jaun Bash, PharmD, CPP, BCPS, [facility] Heart Failure Pharmacist  Phone - [phone] 07/11/2024 9:33 AM

## 2024-07-11 NOTE — Plan of Care (Signed)

## 2024-07-22 ENCOUNTER — Ambulatory Visit: Admitting: Family

## 2024-07-30 ENCOUNTER — Telehealth: Payer: Self-pay | Admitting: Family

## 2024-07-30 NOTE — Telephone Encounter (Signed)
 Called to confirm/remind patient of their appointment at the [facility] on 07/31/24.   Appointment:   [x] Confirmed  [] Left mess   [] No answer/No voice mail  [] VM Full/unable to leave message  [] Phone not in service  Patient reminded to bring all medications and/or complete list.  Confirmed patient has transportation. Gave directions, instructed to utilize valet parking.

## 2024-07-31 ENCOUNTER — Ambulatory Visit: Payer: Self-pay | Admitting: Family

## 2024-07-31 ENCOUNTER — Encounter: Payer: Self-pay | Admitting: Family

## 2024-07-31 ENCOUNTER — Other Ambulatory Visit
Admission: RE | Admit: 2024-07-31 | Discharge: 2024-07-31 | Disposition: A | Discharge status: Home / Self Care | Source: Ambulatory Visit | Attending: Family | Admitting: Family

## 2024-07-31 ENCOUNTER — Ambulatory Visit: Admitting: Family

## 2024-07-31 VITALS — BP 128/75 | HR 80 | Wt 147.1 lb

## 2024-07-31 DIAGNOSIS — Z794 Long term (current) use of insulin: Secondary | ICD-10-CM

## 2024-07-31 DIAGNOSIS — D508 Other iron deficiency anemias: Secondary | ICD-10-CM | POA: Diagnosis not present

## 2024-07-31 DIAGNOSIS — I5022 Chronic systolic (congestive) heart failure: Secondary | ICD-10-CM | POA: Insufficient documentation

## 2024-07-31 DIAGNOSIS — I10 Essential (primary) hypertension: Secondary | ICD-10-CM | POA: Diagnosis not present

## 2024-07-31 DIAGNOSIS — E119 Type 2 diabetes mellitus without complications: Secondary | ICD-10-CM | POA: Diagnosis not present

## 2024-07-31 LAB — BASIC METABOLIC PANEL WITH GFR
Anion gap: 8 (ref 5–15)
BUN: 14 mg/dL (ref 8–23)
CO2: 25 mmol/L (ref 22–32)
Calcium: 8.6 mg/dL — ABNORMAL LOW (ref 8.9–10.3)
Chloride: 109 mmol/L (ref 98–111)
Creatinine, Ser: 0.82 mg/dL (ref 0.44–1.00)
GFR, Estimated: >60 mL/min (ref >60)
Glucose, Bld: 76 mg/dL (ref 70–99)
Potassium: 4.5 mmol/L (ref 3.5–5.1)
Sodium: 142 mmol/L (ref 135–145)

## 2024-07-31 NOTE — Patient Instructions (Addendum)
 Medication Changes:  TAKE BOTH OF YOUR TORSEMIDE  PILLS IN THE AM.   Lab Work:  Go over to the [facility]. Go pass the gift shop and have your blood work completed.  We will only call you if the results are abnormal or if the provider would like to make medication changes.  No news is good news.    Follow-Up in: 4 MONTHS WITH DR. ZENAIDA.   Our Doctors' schedules are NOT open yet for 4 months. We will place you on our recall list. Once they are available, we will call you to schedule your follow up appointment.    Thank you for choosing [facility].    At the Advanced [facility] Failure Clinic, you and your health needs are our priority. We have a designated team specialized in the treatment of Heart Failure. This Care Team includes your primary Heart Failure Specialized Cardiologist (physician), Advanced Practice Providers (APPs- Physician Assistants and Nurse Practitioners), and Pharmacist who all work together to provide you with the care you need, when you need it.   You may see any of the following providers on your designated Care Team at your next follow up:  Dr. Toribio Fuel Dr. Ezra Shuck Dr. Ria Commander Dr. Morene Zenaida Ellouise Class, FNP Jaun Bash, RPH-CPP  Please be sure to bring in all your medications bottles to every appointment.   Need to Contact Us :  If you have any questions or concerns before your next appointment please send us  a message through [facility] or call our office at [phone].    TO LEAVE A MESSAGE FOR THE NURSE SELECT OPTION 2, PLEASE LEAVE A MESSAGE INCLUDING: YOUR NAME DATE OF BIRTH CALL BACK NUMBER REASON FOR CALL**this is important as we prioritize the call backs  YOU WILL RECEIVE A CALL BACK THE SAME DAY AS LONG AS YOU CALL BEFORE 4:00 PM

## 2024-07-31 NOTE — Progress Notes (Unsigned)
 Advanced Heart Failure Clinic Note    PCP: Diedra Lame, MD  Cardiologist: Dewane Shiner, MD (last seen 10/25)  Chief Complaint: fatigue   HPI:  Heather Choi is a 63 y/o female with a history of dual chamber ICD immplanted 09/2014, asthma, IDDM, HTN, thyroid  disease, bell's palsy, COPD, anemia, SVT, Graves disease, anxiety, CKD, tobacco use and chronic heart failure.   Echo 01/22/2019: EF of 25-30% along with moderate AR.   Admitted 07/19/22 due to acute onset dyspnea associated orthopnea, paroxysmal nocturnal dyspnea, weight gain over the last year, abdominal distention due to HF exacerbation. Cardiology consult obtained. Initially given IV lasix  with transition to oral diuretics. Echo 07/21/22: EF of <20% with mild LVH, severe LAE and mild/moderate AR. Carvedilol  decreased due to hypotension. Given antibiotics. Unable to be weaned off of oxygen. Head CT done due to dizziness and it was negative. Elevated troponin thought to be due to HF exacerbation.  Discharged after 5 days.   Admitted 12/07/23 with 2 days of cough wheezing fever shortness of breath. Found to have influenza with COPD exacerbation. Needed nasal cannula oxygen. Given IV lasix  for a/c heart failure and then transitioned to oral diuretics. Weaned off oxygen.   Admitted 07/08/24 worsening edema, orthopnea, dyspnea with exertion. Diuresed. Cardiology consulted. Echo 07/08/24: EF <20%, normal RV, moderate LAE, global hypokinesis, moderate MR / AR  She presents today for a HF follow-up visit with a chief complaint of fatigue. Has associated rare palpitations, gradual weight gain, frequent dizziness, puffiness around ankles. Currently taking torsemide  20mg  daily and occasionally takes it BID. Just saw HF provider at [facility] yesterday.   ROS: All systems negative except what is listed in HPI, PMH and Problem List   Past Medical History:  Diagnosis Date   Asthma    Bell's palsy    fingers tingling in cold weather   CHF  (congestive heart failure) (HCC)    COPD (chronic obstructive pulmonary disease) (HCC)    Diabetes mellitus without complication (HCC)    Enlarged heart    Hypertension    Thyroid  disease     Current Outpatient Medications  Medication Sig Dispense Refill   albuterol  (VENTOLIN  HFA) 108 (90 Base) MCG/ACT inhaler Inhale 2 puffs into the lungs every 6 (six) hours as needed for wheezing or shortness of breath. 8 g 2   ALPRAZolam  (XANAX ) 0.5 MG tablet Take 0.5 mg by mouth daily as needed for sleep.     aspirin  EC 81 MG tablet Take 81 mg by mouth daily.      Aspirin -Caffeine (BC ON THE GO) 845-65 MG PACK Take 1 Package by mouth daily as needed (headaches).     carvedilol  (COREG ) 3.125 MG tablet Take 1 tablet (3.125 mg total) by mouth 2 (two) times daily. 60 tablet 0   cetirizine (ZYRTEC) 10 MG tablet Take 10 mg by mouth daily as needed for allergies.     dapagliflozin  propanediol (FARXIGA ) 10 MG TABS tablet Take 1 tablet (10 mg total) by mouth daily before breakfast. 30 tablet 0   guaiFENesin  (MUCINEX ) 600 MG 12 hr tablet Take 1 tablet (600 mg total) by mouth 2 (two) times daily. (Patient not taking: Reported on 07/08/2024) 30 tablet 0   ibuprofen (ADVIL) 600 MG tablet Take 600 mg by mouth every 6 (six) hours as needed for mild pain (pain score 1-3).     levothyroxine  (SYNTHROID ) 125 MCG tablet Take 1 tablet (125 mcg total) by mouth daily. 30 tablet 1   lidocaine (LIDODERM) 5 %  Place 1 patch onto the skin daily. Remove & Discard patch within 12 hours or as directed by MD 30 patch 0   montelukast  (SINGULAIR ) 10 MG tablet Take 1 tablet by mouth at bedtime. (Patient not taking: Reported on 07/08/2024)     nicotine  (NICODERM CQ  - DOSED IN MG/24 HOURS) 14 mg/24hr patch Place 1 patch (14 mg total) onto the skin daily. 28 patch 0   potassium chloride  SA (K-DUR,KLOR-CON ) 20 MEQ tablet Take 20 mEq by mouth 2 (two) times daily.      sacubitril -valsartan  (ENTRESTO ) 24-26 MG Take 1 tablet by mouth 2 (two) times  daily.     spironolactone  (ALDACTONE ) 25 MG tablet Take 1 tablet (25 mg total) by mouth daily. 30 tablet 1   torsemide  (DEMADEX ) 20 MG tablet Take 1 tablet (20 mg total) by mouth 2 (two) times daily. 60 tablet 1   No current facility-administered medications for this visit.    Allergies  Allergen Reactions   Clindamycin /Lincomycin     Burning sensastion   Amlodipine Other (See Comments) and Itching   Flonase  [Fluticasone ] Other (See Comments)    irratation   Penicillins Other (See Comments)    Has patient had a PCN reaction causing immediate rash, facial/tongue/throat swelling, SOB or lightheadedness with hypotension: Yes Has patient had a PCN reaction causing severe rash involving mucus membranes or skin necrosis: No Has patient had a PCN reaction that required hospitalization: No Has patient had a PCN reaction occurring within the last 10 years: Yes If all of the above answers are NO, then may proceed with Cephalosporin use.   Hydrochlorothiazide Rash      Social History   Socioeconomic History   Marital status: Single    Spouse name: Not on file   Number of children: Not on file   Years of education: Not on file   Highest education level: Not on file  Occupational History   Not on file  Tobacco Use   Smoking status: Every Day    Current packs/day: 0.50    Types: Cigarettes   Smokeless tobacco: Never  Vaping Use   Vaping status: Never Used  Substance and Sexual Activity   Alcohol use: Not Currently    Comment: rarely   Drug use: No   Sexual activity: Not Currently  Other Topics Concern   Not on file  Social History Narrative   Not on file   Social Drivers of Health   Financial Resource Strain: Patient Declined (03/17/2024)   Received from [facility]   Overall Financial Resource Strain (CARDIA)    Difficulty of Paying Living Expenses: Patient declined  Food Insecurity: No Food Insecurity (07/08/2024)   Hunger Vital Sign    Worried About  Running Out of Food in the Last Year: Never true    Ran Out of Food in the Last Year: Never true  Transportation Needs: No Transportation Needs (07/08/2024)   PRAPARE - Administrator, Civil Service (Medical): No    Lack of Transportation (Non-Medical): No  Physical Activity: Not on file  Stress: Not on file  Social Connections: Not on file  Intimate Partner Violence: Not At Risk (07/08/2024)   Humiliation, Afraid, Rape, and Kick questionnaire    Fear of Current or Ex-Partner: No    Emotionally Abused: No    Physically Abused: No    Sexually Abused: No      Family History  Problem Relation Age of Onset   Heart failure Father  Heart attack Mother    Vitals:   07/31/24 1133  BP: 128/75  Pulse: 80  SpO2: 95%  Weight: 147 lb 2 oz (66.7 kg)   Wt Readings from Last 3 Encounters:  07/31/24 147 lb 2 oz (66.7 kg)  07/11/24 146 lb 2.6 oz (66.3 kg)  01/08/24 156 lb 6.4 oz (70.9 kg)   Lab Results  Component Value Date   CREATININE 0.82 07/31/2024   CREATININE 1.20 (H) 07/11/2024   CREATININE 1.08 (H) 07/10/2024     PHYSICAL EXAM:  General: Well appearing.  Cor: No JVD. Regular rhythm, rate.  Lungs: clear Abdomen: soft, nontender, nondistended. Extremities: trace edema around ankles Neuro:. Affect pleasant      ECG: not done   ASSESSMENT & PLAN:  1: NICM with reduced ejection fraction- - suspect due to HTN - NYHA Choi II - euvolemic today - weight down 9 pounds from last visit here 6 months ago - Echo 07/21/22: EF of <20% with mild LVH, severe LAE and mild/moderate AR. - Echo 07/08/24: EF <20%, normal RV, moderate LAE, global hypokinesis, moderate MR / AR - not adding salt to her food and has been trying to read food labels for sodium content - continue carvedilol  3.125mg  BID - continue farxiga  10mg  daily - continue potassium 20meq BID - continue entresto  24/26mg  BID - continue spironolactone  25mg  daily - continue torsemide  40mg  daily but instead  of 20mg  BID, take 40mg  AM - BMET / proBNP today - saw HF provider (Custovic) 10/25 - ICD without shocks - BNP 07/07/24 reviewed and was 1414.9  2: HTN- - BP 128/75 - saw PCP Violette) 10/25 - BMP 07/11/24 reviewed: sodium 141, potassium 4.0, creatinine 0.82 and GFR >60 - BMET today  3: DM- - A1c 07/08/24 was 4.8% - saw vascular Zoe) 05/21  4: IDA- - Hg 07/08/24 reviewed and was 16   Return in 4 months, sooner if needed.   I spent 20 minutes reviewing records, interviewing/ examing patient and managing plan/ orders.    Heather DELENA Class, Heather Choi 07/31/24

## 2024-08-01 ENCOUNTER — Encounter: Payer: Self-pay | Admitting: Family
